# Patient Record
Sex: Female | Born: 1965
Health system: Southern US, Community
[De-identification: ages and names within clinical notes are randomized; demographics above are authoritative.]

## PROBLEM LIST (undated history)

## (undated) DIAGNOSIS — Z95810 Presence of automatic (implantable) cardiac defibrillator: Secondary | ICD-10-CM

## (undated) DIAGNOSIS — E669 Obesity, unspecified: Secondary | ICD-10-CM

## (undated) DIAGNOSIS — N189 Chronic kidney disease, unspecified: Secondary | ICD-10-CM

## (undated) DIAGNOSIS — G4730 Sleep apnea, unspecified: Secondary | ICD-10-CM

## (undated) DIAGNOSIS — I4819 Other persistent atrial fibrillation: Secondary | ICD-10-CM

## (undated) DIAGNOSIS — I119 Hypertensive heart disease without heart failure: Secondary | ICD-10-CM

## (undated) DIAGNOSIS — I10 Essential (primary) hypertension: Secondary | ICD-10-CM

## (undated) DIAGNOSIS — E039 Hypothyroidism, unspecified: Secondary | ICD-10-CM

## (undated) DIAGNOSIS — J302 Other seasonal allergic rhinitis: Secondary | ICD-10-CM

## (undated) DIAGNOSIS — I82601 Acute embolism and thrombosis of unspecified veins of right upper extremity: Secondary | ICD-10-CM

## (undated) DIAGNOSIS — I472 Ventricular tachycardia, unspecified: Secondary | ICD-10-CM

## (undated) DIAGNOSIS — F32A Depression, unspecified: Secondary | ICD-10-CM

## (undated) DIAGNOSIS — O903 Peripartum cardiomyopathy: Secondary | ICD-10-CM

## (undated) DIAGNOSIS — M1990 Unspecified osteoarthritis, unspecified site: Secondary | ICD-10-CM

## (undated) DIAGNOSIS — E119 Type 2 diabetes mellitus without complications: Secondary | ICD-10-CM

## (undated) DIAGNOSIS — J189 Pneumonia, unspecified organism: Secondary | ICD-10-CM

## (undated) DIAGNOSIS — I509 Heart failure, unspecified: Secondary | ICD-10-CM

## (undated) DIAGNOSIS — F329 Major depressive disorder, single episode, unspecified: Secondary | ICD-10-CM

## (undated) HISTORY — PX: CARDIAC DEFIBRILLATOR PLACEMENT: SHX171

## (undated) HISTORY — DX: Hypertensive heart disease without heart failure: I11.9

## (undated) HISTORY — DX: Obesity, unspecified: E66.9

## (undated) HISTORY — DX: Other seasonal allergic rhinitis: J30.2

## (undated) HISTORY — DX: Ventricular tachycardia, unspecified: I47.20

## (undated) HISTORY — DX: Other persistent atrial fibrillation: I48.19

## (undated) HISTORY — DX: Sleep apnea, unspecified: G47.30

## (undated) HISTORY — DX: Peripartum cardiomyopathy: O90.3

## (undated) HISTORY — DX: Ventricular tachycardia: I47.2

---
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
## 2002-03-31 HISTORY — PX: CHOLECYSTECTOMY: SHX55

## 2004-12-09 DIAGNOSIS — I420 Dilated cardiomyopathy: Secondary | ICD-10-CM | POA: Insufficient documentation

## 2004-12-09 DIAGNOSIS — I509 Heart failure, unspecified: Secondary | ICD-10-CM | POA: Insufficient documentation

## 2004-12-09 DIAGNOSIS — Z95810 Presence of automatic (implantable) cardiac defibrillator: Secondary | ICD-10-CM | POA: Insufficient documentation

## 2004-12-09 DIAGNOSIS — Z79899 Other long term (current) drug therapy: Secondary | ICD-10-CM | POA: Insufficient documentation

## 2011-05-20 DIAGNOSIS — I509 Heart failure, unspecified: Secondary | ICD-10-CM | POA: Diagnosis not present

## 2011-05-28 DIAGNOSIS — I428 Other cardiomyopathies: Secondary | ICD-10-CM | POA: Diagnosis not present

## 2011-05-28 DIAGNOSIS — Z95810 Presence of automatic (implantable) cardiac defibrillator: Secondary | ICD-10-CM | POA: Diagnosis not present

## 2011-07-15 ENCOUNTER — Other Ambulatory Visit: Payer: Self-pay

## 2011-07-15 DIAGNOSIS — Z95810 Presence of automatic (implantable) cardiac defibrillator: Secondary | ICD-10-CM | POA: Diagnosis not present

## 2011-07-15 DIAGNOSIS — Z79899 Other long term (current) drug therapy: Secondary | ICD-10-CM | POA: Diagnosis not present

## 2011-07-15 DIAGNOSIS — Z6841 Body Mass Index (BMI) 40.0 and over, adult: Secondary | ICD-10-CM | POA: Diagnosis not present

## 2011-07-15 DIAGNOSIS — I10 Essential (primary) hypertension: Secondary | ICD-10-CM | POA: Diagnosis not present

## 2011-07-15 DIAGNOSIS — E669 Obesity, unspecified: Secondary | ICD-10-CM | POA: Diagnosis not present

## 2011-07-15 DIAGNOSIS — T50901A Poisoning by unspecified drugs, medicaments and biological substances, accidental (unintentional), initial encounter: Secondary | ICD-10-CM | POA: Diagnosis not present

## 2011-07-15 DIAGNOSIS — R031 Nonspecific low blood-pressure reading: Secondary | ICD-10-CM | POA: Diagnosis not present

## 2011-07-15 DIAGNOSIS — D509 Iron deficiency anemia, unspecified: Secondary | ICD-10-CM | POA: Diagnosis present

## 2011-07-15 DIAGNOSIS — F4321 Adjustment disorder with depressed mood: Secondary | ICD-10-CM | POA: Diagnosis present

## 2011-07-15 DIAGNOSIS — T50991A Poisoning by other drugs, medicaments and biological substances, accidental (unintentional), initial encounter: Secondary | ICD-10-CM | POA: Diagnosis not present

## 2011-07-15 DIAGNOSIS — I428 Other cardiomyopathies: Secondary | ICD-10-CM | POA: Diagnosis not present

## 2011-07-15 DIAGNOSIS — T448X1A Poisoning by centrally-acting and adrenergic-neuron-blocking agents, accidental (unintentional), initial encounter: Secondary | ICD-10-CM | POA: Diagnosis present

## 2011-07-24 DIAGNOSIS — I10 Essential (primary) hypertension: Secondary | ICD-10-CM | POA: Diagnosis not present

## 2011-07-24 DIAGNOSIS — Z86718 Personal history of other venous thrombosis and embolism: Secondary | ICD-10-CM | POA: Diagnosis not present

## 2011-07-24 DIAGNOSIS — M79609 Pain in unspecified limb: Secondary | ICD-10-CM | POA: Diagnosis not present

## 2011-07-24 DIAGNOSIS — M7989 Other specified soft tissue disorders: Secondary | ICD-10-CM | POA: Diagnosis not present

## 2011-07-24 DIAGNOSIS — Z79899 Other long term (current) drug therapy: Secondary | ICD-10-CM | POA: Diagnosis not present

## 2011-07-25 DIAGNOSIS — I82619 Acute embolism and thrombosis of superficial veins of unspecified upper extremity: Secondary | ICD-10-CM | POA: Diagnosis not present

## 2011-07-25 DIAGNOSIS — M79609 Pain in unspecified limb: Secondary | ICD-10-CM | POA: Diagnosis not present

## 2011-07-25 DIAGNOSIS — M7989 Other specified soft tissue disorders: Secondary | ICD-10-CM | POA: Diagnosis not present

## 2011-08-12 ENCOUNTER — Encounter: Payer: Self-pay | Admitting: *Deleted

## 2011-08-19 ENCOUNTER — Encounter: Payer: Self-pay | Admitting: Cardiology

## 2011-08-25 DIAGNOSIS — I428 Other cardiomyopathies: Secondary | ICD-10-CM | POA: Diagnosis not present

## 2011-08-25 DIAGNOSIS — Z95810 Presence of automatic (implantable) cardiac defibrillator: Secondary | ICD-10-CM | POA: Diagnosis not present

## 2011-10-16 ENCOUNTER — Encounter: Payer: Self-pay | Admitting: Cardiology

## 2011-11-10 ENCOUNTER — Encounter: Payer: Self-pay | Admitting: Cardiology

## 2011-11-10 ENCOUNTER — Ambulatory Visit (INDEPENDENT_AMBULATORY_CARE_PROVIDER_SITE_OTHER): Payer: Medicare Other | Admitting: Cardiology

## 2011-11-10 VITALS — BP 159/91 | HR 97 | Ht 67.0 in | Wt 297.0 lb

## 2011-11-10 DIAGNOSIS — O903 Peripartum cardiomyopathy: Secondary | ICD-10-CM | POA: Diagnosis not present

## 2011-11-10 DIAGNOSIS — I10 Essential (primary) hypertension: Secondary | ICD-10-CM | POA: Insufficient documentation

## 2011-11-10 MED ORDER — FUROSEMIDE 20 MG PO TABS: 20.0000 mg | ORAL_TABLET | Freq: Every day | ORAL | Status: DC | PRN

## 2011-11-10 MED ORDER — CARVEDILOL 25 MG PO TABS: 37.5000 mg | ORAL_TABLET | Freq: Two times a day (BID) | ORAL | Status: DC

## 2011-11-10 MED ORDER — LISINOPRIL 20 MG PO TABS: 20.0000 mg | ORAL_TABLET | Freq: Two times a day (BID) | ORAL | Status: DC

## 2011-11-10 NOTE — Patient Instructions (Addendum)
Your physician recommends that you schedule a follow-up appointment in: 2 months with Dr. Antoine Poche.  Your physician has recommended you make the following change in your medication: increase your carvedilol to 37.5 mg twice daily. Please take 1&1/2 tablets twice daily of your 25 mg tablets. Your physician has requested that you have a MUGA: A multigated acquisition (MUGA) scan is a test that looks at the chambers and blood vessels of the heart. Please see the CareNotes handout/brochure given to you today for further information. You have been referred to ICD Clinic here at [facility].

## 2011-11-10 NOTE — Progress Notes (Signed)
HPI The patient has a history of a postpartum cardiomyopathy in [place]. She has not been seen in this clinic before.  Her cardiomyopathy was diagnosed in 1996 and she says her EF was about 20%. After treatment it went up to about 45%. In 2002 she was living in [facility] and had sustained ventricular tachycardia requiring placement of an ICD. She says the battery was replaced about 3 years ago. She said at one point in time following her ICD her EF was actually up to 60% though she thinks most recently a few years ago a mild suggested to be 49%. She was in the hospital this spring after an intentional overdose of her beta blocker. She was referred here for followup after this.    The patient reports that she's been doing fine. Earlier in the year she been losing weight but gained it back. She's anxious to be back on her diet. She does some exercising. With this she denies any chest pressure, neck or arm discomfort. She has no shortness of breath, PND or orthopnea. She has no palpitations, presyncope or syncope. She's had no edema.  Allergies  Allergen Reactions  . Morphine And Related Nausea Only and Other (See Comments)    Feels sick,and like she is going to pass out    Current Outpatient Prescriptions  Medication Sig Dispense Refill  . carvedilol (COREG) 25 MG tablet Take 25 mg by mouth 2 (two) times daily with a meal.      . furosemide (LASIX) 20 MG tablet Take 20 mg by mouth daily as needed.      Marland Kitchen lisinopril (PRINIVIL,ZESTRIL) 20 MG tablet Take 20 mg by mouth 2 (two) times daily.      . Multiple Vitamin (MULTIVITAMIN) tablet Take 1 tablet by mouth daily.        Past Medical History  Diagnosis Date  . Postpartum cardiomyopathy     with history of sustained V tach, s/p ICD placement. Last echocardiogram was last year and EF 45 -50 %  . Unspecified essential hypertension   . Seasonal allergies   . Palpitations     Past Surgical History  Procedure Date  . Cardiac  defibrillator placement     X'S 2  . Cholecystectomy   . Cesarean section     Family History  Problem Relation Age of Onset  . Other Mother     has chronic pain syndrome from DDD with spinal cord stimulator    History   Social History  . Marital Status: Divorced    Spouse Name: N/A    Number of Children: N/A  . Years of Education: N/A   Occupational History  . Not on file.   Social History Main Topics  . Smoking status: Never Smoker   . Smokeless tobacco: Never Used  . Alcohol Use: Not on file  . Drug Use: Not on file  . Sexually Active: Not on file   Other Topics Concern  . Not on file   Social History Narrative   Lives with her parents and 73 year old daughter    ROS:  As stated in the HPI and negative for all other systems.   PHYSICAL EXAM BP 159/91  Pulse 97  Ht 5\' 7"  (1.702 m)  Wt 297 lb (134.718 kg)  BMI 46.52 kg/m2 GENERAL:  Well appearing HEENT:  Pupils equal round and reactive, fundi not visualized, oral mucosa unremarkable NECK:  No jugular venous distention, waveform within normal limits, carotid upstroke brisk  and symmetric, no bruits, no thyromegaly LYMPHATICS:  No cervical, inguinal adenopathy LUNGS:  Clear to auscultation bilaterally BACK:  No CVA tenderness CHEST:  Well healed ICD pocket HEART:  PMI not displaced or sustained,S1 and S2 within normal limits, no S3, no S4, no clicks, no rubs, no murmurs ABD:  Flat, positive bowel sounds normal in frequency in pitch, no bruits, no rebound, no guarding, no midline pulsatile mass, no hepatomegaly, no splenomegaly, obese EXT:  2 plus pulses throughout, no edema, no cyanosis no clubbing SKIN:  No rashes no nodules NEURO:  Cranial nerves II through XII grossly intact, motor grossly intact throughout [facility]:  Cognitively intact, oriented to person place and time  EKG:  Sinus rhythm, rate 81, poor anterior R wave progression, low voltage in the limb leads, no acute ST-T wave changes  07/15/11  ASSESSMENT AND PLAN  Peripartum cardiomyopathy - I will valuate with a MUGA as she says she has poor acoustic windows.  I will be increasing her beta blocker as below.  Hypertension - She was supposed to be on Norvasc but stopped this because she was fatigued. I have encouraged her to increase her carvedilol to 37.5 mg twice a day as she does need better blood pressure control.  Obesity - The patient understands the need to lose weight with diet and exercise. We have discussed specific strategies for this.  ICD - I will have her followup in the ICD clinic

## 2011-11-12 ENCOUNTER — Other Ambulatory Visit: Payer: Self-pay | Admitting: Cardiology

## 2011-11-12 DIAGNOSIS — O903 Peripartum cardiomyopathy: Secondary | ICD-10-CM

## 2011-11-14 ENCOUNTER — Encounter: Payer: Self-pay | Admitting: Internal Medicine

## 2011-12-11 DIAGNOSIS — O903 Peripartum cardiomyopathy: Secondary | ICD-10-CM | POA: Diagnosis not present

## 2011-12-11 DIAGNOSIS — I428 Other cardiomyopathies: Secondary | ICD-10-CM | POA: Diagnosis not present

## 2011-12-11 DIAGNOSIS — I10 Essential (primary) hypertension: Secondary | ICD-10-CM | POA: Diagnosis not present

## 2011-12-15 ENCOUNTER — Encounter: Payer: Self-pay | Admitting: Internal Medicine

## 2011-12-15 ENCOUNTER — Ambulatory Visit (INDEPENDENT_AMBULATORY_CARE_PROVIDER_SITE_OTHER): Payer: Medicare Other | Admitting: Internal Medicine

## 2011-12-15 VITALS — BP 134/92 | HR 95 | Ht 66.0 in | Wt 304.0 lb

## 2011-12-15 DIAGNOSIS — O903 Peripartum cardiomyopathy: Secondary | ICD-10-CM | POA: Diagnosis not present

## 2011-12-15 DIAGNOSIS — I472 Ventricular tachycardia, unspecified: Secondary | ICD-10-CM

## 2011-12-15 DIAGNOSIS — I509 Heart failure, unspecified: Secondary | ICD-10-CM | POA: Diagnosis not present

## 2011-12-15 LAB — ICD DEVICE OBSERVATION
BATTERY VOLTAGE: 3.1091 V
BRDY-0002RV: 40 {beats}/min
CHARGE TIME: 9.339 s
DEV-0020ICD: NEGATIVE
PACEART VT: 0
RV LEAD AMPLITUDE: 6 mv
TOT-0002: 0
TOT-0006: 20100824000000
TZAT-0004SLOWVT: 8
TZAT-0005FASTVT: 88 pct
TZAT-0005SLOWVT: 88 pct
TZAT-0011FASTVT: 10 ms
TZAT-0012FASTVT: 200 ms
TZAT-0012SLOWVT: 200 ms
TZAT-0013FASTVT: 1
TZAT-0013SLOWVT: 3
TZAT-0019FASTVT: 8 V
TZAT-0020SLOWVT: 1.5 ms
TZON-0003FASTVT: 240 ms
TZON-0003SLOWVT: 330 ms
TZON-0003VSLOWVT: 360 ms
TZON-0004SLOWVT: 32
TZON-0004VSLOWVT: 36
TZON-0005SLOWVT: 12
TZST-0001FASTVT: 3
TZST-0001FASTVT: 5
TZST-0001FASTVT: 6
TZST-0001SLOWVT: 5
TZST-0003FASTVT: 35 J
TZST-0003FASTVT: 35 J
TZST-0003FASTVT: 35 J
TZST-0003SLOWVT: 35 J
TZST-0003SLOWVT: 35 J
VENTRICULAR PACING ICD: 0.01 pct

## 2011-12-15 NOTE — Assessment & Plan Note (Signed)
She has had sustained ventricular tachycardia previously, though no episodes since her ICD was implanted per her report. She does recall having inappropriate shocks for SVT.  Normal ICD function today See Arita Miss Art report NIDs increased to reduce risks of inappropriate ICD shocks  Carelink every 3 months Return in 1year to see me in the [place] device clinic

## 2011-12-15 NOTE — Progress Notes (Signed)
Erin Kos, MD: Primary Cardiologist:  Dr Erin Jarvis is a 46 y.o. female with a h/o peripartum cardiomyopathy sp ICD (MDT) in [place] who presents today to establish care in the Electrophysiology device clinic.  Her cardiomyopathy was diagnosed in 82. In May 2002 she had sustained ventricular tachycardia with subsequent placement of an ICD.   She reports having inappropriate shocks for SVT but denies shocks for VT thereafter.  She moved to [facility] and had her device followed in [place].  She underwent generator replacement 11/21/2008 and has done well since.  She remains active.  The patient reports doing very well since having a pacemaker implanted and remains very active despite her age.  Recently, she has had mild SOB above her baseline and minor edema.  Today, she  denies symptoms of palpitations, chest pain,  orthopnea, PND, dizziness, presyncope, syncope, or neurologic sequela.  The patientis tolerating medications without difficulties and is otherwise without complaint today.   Past Medical History  Diagnosis Date  . Postpartum cardiomyopathy     Last echocardiogram was last year and EF 45 -50 %  . Unspecified essential hypertension   . Seasonal allergies   . Palpitations   . Ventricular tachycardia     s/p ICD Implantation (MDT Secura VR)   Past Surgical History  Procedure Date  . Cardiac defibrillator placement 08/12/2000, 11/11/2008    Most recent Generator MDT Secura VR placed in [place]  . Cholecystectomy   . Cesarean section     History   Social History  . Marital Status: Divorced    Spouse Name: N/A    Number of Children: N/A  . Years of Education: N/A   Occupational History  . Not on file.   Social History Main Topics  . Smoking status: Never Smoker   . Smokeless tobacco: Never Used  . Alcohol Use: No  . Drug Use: No  . Sexually Active: Not on file   Other Topics Concern  . Not on file   Social History Narrative   Lives with  her parents and 34 year old daughter in [place].    Family History  Problem Relation Age of Onset  . Other Mother     has chronic pain syndrome from DDD with spinal cord stimulator    Allergies  Allergen Reactions  . Morphine And Related Nausea Only and Other (See Comments)    Feels sick,and like she is going to pass out    Current Outpatient Prescriptions  Medication Sig Dispense Refill  . carvedilol (COREG) 25 MG tablet Take 1.5 tablets (37.5 mg total) by mouth 2 (two) times daily with a meal.  90 tablet  3  . furosemide (LASIX) 20 MG tablet Take 1 tablet (20 mg total) by mouth daily as needed.  30 tablet  3  . lisinopril (PRINIVIL,ZESTRIL) 20 MG tablet Take 1 tablet (20 mg total) by mouth 2 (two) times daily.  60 tablet  3  . Multiple Vitamin (MULTIVITAMIN) tablet Take 1 tablet by mouth daily.        ROS- all systems are reviewed and negative except as per HPI  Physical Exam: Filed Vitals:   12/15/11 1358  BP: 134/92  Pulse: 95  Height: 5\' 6"  (1.676 m)  Weight: 304 lb (137.893 kg)    GEN- The patient is well appearing, alert and oriented x 3 today.   Head- normocephalic, atraumatic Eyes-  Sclera clear, conjunctiva pink Ears- hearing intact Oropharynx- clear Neck- supple, JVP  9cm Lymph- no cervical lymphadenopathy Lungs- few basilar rales, normal work of breathing Chest- ICD pocket is well healed Heart- Regular rate and rhythm, no murmurs, rubs or gallops, PMI not laterally displaced GI- soft, NT, ND, + BS Extremities- no clubbing, cyanosis, 1+ edema MS- no significant deformity or atrophy Skin- no rash or lesion Psych- euthymic mood, full affect Neuro- strength and sensation are intact  Pacemaker interrogation- reviewed in detail today,  See PACEART report  Assessment and Plan:

## 2011-12-15 NOTE — Assessment & Plan Note (Signed)
Mildly volume overloaded on exam and optivol is elevated She will take lasix 20mg  daily x 5 days and then return to prn should her weight increase by more than 2 lbs in 24 hours. 2 gram sodium restriction and daily weights  Follow-up with Dr Antoine Poche as scheduled.

## 2011-12-15 NOTE — Patient Instructions (Addendum)
Your physician wants you to follow-up in: 12 months with Dr Jacquiline Doe will receive a reminder letter in the mail two months in advance. If you don't receive a letter, please call our office to schedule the follow-up appointment.   Remote monitoring is used to monitor your Pacemaker of ICD from home. This monitoring reduces the number of office visits required to check your device to one time per year. It allows [place] to keep an eye on the functioning of your device to ensure it is working properly. You are scheduled for a device check from home on 03/22/12 You may send your transmission at any time that day. If you have a wireless device, the transmission will be sent automatically. After your physician reviews your transmission, you will receive a postcard with your next transmission date.  Your physician has recommended you make the following change in your medication:  1) Take your Furosemide 20mg  for 5 days then go back to as need for weight gain  2 Gram Low Sodium Diet A 2 gram sodium diet restricts the amount of sodium in the diet to no more than 2 g or 2000 mg daily. Limiting the amount of sodium is often used to help lower blood pressure. It is important if you have heart, liver, or kidney problems. Many foods contain sodium for flavor and sometimes as a preservative. When the amount of sodium in a diet needs to be low, it is important to know what to look for when choosing foods and drinks. The following includes some information and guidelines to help make it easier for you to adapt to a low sodium diet. QUICK TIPS  Do not add salt to food.   Avoid convenience items and fast food.   Choose unsalted snack foods.   Buy lower sodium products, often labeled as "lower sodium" or "no salt added."   Check food labels to learn how much sodium is in 1 serving.   When eating at a restaurant, ask that your food be prepared with less salt or none, if possible.  READING FOOD LABELS FOR SODIUM  INFORMATION The nutrition facts label is a good place to find how much sodium is in foods. Look for products with no more than 500 to 600 mg of sodium per meal and no more than 150 mg per serving. Remember that 2 g = 2000 mg. The food label may also list foods as:  Sodium-free: Less than 5 mg in a serving.   Very low sodium: 35 mg or less in a serving.   Low-sodium: 140 mg or less in a serving.   Light in sodium: 50% less sodium in a serving. For example, if a food that usually has 300 mg of sodium is changed to become light in sodium, it will have 150 mg of sodium.   Reduced sodium: 25% less sodium in a serving. For example, if a food that usually has 400 mg of sodium is changed to reduced sodium, it will have 300 mg of sodium.  CHOOSING FOODS Grains  Avoid: Salted crackers and snack items. Some cereals, including instant hot cereals. Bread stuffing and biscuit mixes. Seasoned rice or pasta mixes.   Choose: Unsalted snack items. Low-sodium cereals, oats, puffed wheat and rice, shredded wheat. English muffins and bread. Pasta.  Meats  Avoid: Salted, canned, smoked, spiced, pickled meats, including fish and poultry. Bacon, ham, sausage, cold cuts, hot dogs, anchovies.   Choose: Low-sodium canned tuna and salmon. Fresh or frozen meat, poultry,  and fish.  Dairy  Avoid: Processed cheese and spreads. Cottage cheese. Buttermilk and condensed milk. Regular cheese.   Choose: Milk. Low-sodium cottage cheese. Yogurt. Sour cream. Low-sodium cheese.  Fruits and Vegetables  Avoid: Regular canned vegetables. Regular canned tomato sauce and paste. Frozen vegetables in sauces. Olives. Rosita Fire. Relishes. Sauerkraut.   Choose: Low-sodium canned vegetables. Low-sodium tomato sauce and paste. Frozen or fresh vegetables. Fresh and frozen fruit.  Condiments  Avoid: Canned and packaged gravies. Worcestershire sauce. Tartar sauce. Barbecue sauce. Soy sauce. Steak sauce. Ketchup. Onion, garlic, and table  salt. Meat flavorings and tenderizers.   Choose: Fresh and dried herbs and spices. Low-sodium varieties of mustard and ketchup. Lemon juice. Tabasco sauce. Horseradish.  SAMPLE 2 GRAM SODIUM MEAL PLAN Breakfast / Sodium (mg)  1 cup low-fat milk / 143 mg   2 slices whole-wheat toast / 270 mg   1 tbs heart-healthy margarine / 153 mg   1 hard-boiled egg / 139 mg   1 small orange / 0 mg  Lunch / Sodium (mg)  1 cup raw carrots / 76 mg    cup hummus / 298 mg   1 cup low-fat milk / 143 mg    cup red grapes / 2 mg   1 whole-wheat pita bread / 356 mg  Dinner / Sodium (mg)  1 cup whole-wheat pasta / 2 mg   1 cup low-sodium tomato sauce / 73 mg   3 oz lean ground beef / 57 mg   1 small side salad (1 cup raw spinach leaves,  cup cucumber,  cup yellow bell pepper) with 1 tsp olive oil and 1 tsp red wine vinegar / 25 mg  Snack / Sodium (mg)  1 container low-fat vanilla yogurt / 107 mg   3 graham cracker squares / 127 mg  Nutrient Analysis  Calories: 2033   Protein: 77 g   Carbohydrate: 282 g   Fat: 72 g   Sodium: 1971 mg  Document Released: 03/17/2005 Document Revised: 03/06/2011 Document Reviewed: 06/18/2009 [facility] Patient Information 2012 [place], [place].

## 2011-12-17 ENCOUNTER — Telehealth: Payer: Self-pay | Admitting: *Deleted

## 2011-12-17 NOTE — Telephone Encounter (Signed)
Message copied by Eustace Moore on Wed Dec 17, 2011  4:47 PM ------      Message from: Rollene Rotunda      Created: Tue Dec 16, 2011  3:17 PM       EF is moderately reduced.  Continue current medications.  She is supposed to have a follow up with me in October.  Call Ms. Handa with the results and send results to Theodoro Kos, MD

## 2011-12-17 NOTE — Telephone Encounter (Signed)
Patient informed. 

## 2012-01-12 ENCOUNTER — Ambulatory Visit: Payer: Medicare Other | Admitting: Cardiology

## 2012-02-09 ENCOUNTER — Encounter: Payer: Self-pay | Admitting: Cardiology

## 2012-02-09 ENCOUNTER — Ambulatory Visit (INDEPENDENT_AMBULATORY_CARE_PROVIDER_SITE_OTHER): Payer: Medicare Other | Admitting: Cardiology

## 2012-02-09 VITALS — BP 159/93 | HR 101 | Ht 67.0 in | Wt 300.8 lb

## 2012-02-09 DIAGNOSIS — O903 Peripartum cardiomyopathy: Secondary | ICD-10-CM | POA: Diagnosis not present

## 2012-02-09 DIAGNOSIS — I472 Ventricular tachycardia, unspecified: Secondary | ICD-10-CM

## 2012-02-09 DIAGNOSIS — I10 Essential (primary) hypertension: Secondary | ICD-10-CM

## 2012-02-09 MED ORDER — SPIRONOLACTONE 25 MG PO TABS: 25.0000 mg | ORAL_TABLET | Freq: Every day | ORAL | Status: DC

## 2012-02-09 NOTE — Progress Notes (Signed)
HPI The patient has a history of a postpartum cardiomyopathy in [place]. She has not been seen in this clinic before.  Her cardiomyopathy was diagnosed in 1996 and she says her EF was about 20%. After treatment it went up to about 45%. There may have been a point where it was 60% to possibly 50%. In 2002 she was living in [facility] and had sustained ventricular tachycardia requiring placement of an ICD.   Since I last saw her I did send her for a MUGA which demonstrated an EF of about 35% although there was some question of some problem during the study as she had poor IV access. However, the report did not mention this. She's had some weight gain and is disappointed. She had some back pain that limited her activities. She has had no new shortness of breath. The patient denies any new symptoms such as chest discomfort, neck or arm discomfort. There has been no new shortness of breath, PND or orthopnea. There have been no reported palpitations, presyncope or syncope.  She did have only one episode of lightheadedness with a low blood pressure during her heavy menstrual cycle.  Allergies  Allergen Reactions  . Morphine And Related Nausea Only and Other (See Comments)    Feels sick,and like she is going to pass out    Current Outpatient Prescriptions  Medication Sig Dispense Refill  . carvedilol (COREG) 25 MG tablet Take 1.5 tablets (37.5 mg total) by mouth 2 (two) times daily with a meal.  90 tablet  3  . furosemide (LASIX) 20 MG tablet Take 1 tablet (20 mg total) by mouth daily as needed.  30 tablet  3  . lisinopril (PRINIVIL,ZESTRIL) 20 MG tablet Take 1 tablet (20 mg total) by mouth 2 (two) times daily.  60 tablet  3  . Multiple Vitamin (MULTIVITAMIN) tablet Take 1 tablet by mouth daily.        Past Medical History  Diagnosis Date  . Postpartum cardiomyopathy     Last echocardiogram was last year and EF 45 -50 %  . Unspecified essential hypertension   . Seasonal  allergies   . Palpitations   . Ventricular tachycardia     s/p ICD Implantation (MDT Secura VR)    Past Surgical History  Procedure Date  . Cardiac defibrillator placement 08/12/2000, 11/11/2008    Most recent Generator MDT Secura VR placed in [place]  . Cholecystectomy   . Cesarean section     ROS:  As stated in the HPI and negative for all other systems.   PHYSICAL EXAM BP 159/93  Pulse 101  Ht 5\' 7"  (1.702 m)  Wt 300 lb 12.8 oz (136.442 kg)  BMI 47.11 kg/m2  SpO2 96% GENERAL:  Well appearing HEENT:  Pupils equal round and reactive, fundi not visualized, oral mucosa unremarkable NECK:  No jugular venous distention, waveform within normal limits, carotid upstroke brisk and symmetric, no bruits, no thyromegaly LUNGS:  Clear to auscultation bilaterally CHEST:  Well healed ICD pocket HEART:  PMI not displaced or sustained,S1 and S2 within normal limits, no S3, no S4, no clicks, no rubs, no murmurs ABD:  Flat, positive bowel sounds normal in frequency in pitch, no bruits, no rebound, no guarding, no midline pulsatile mass, no hepatomegaly, no splenomegaly, obese EXT:  2 plus pulses throughout, no edema, no cyanosis no clubbing EXT:  No edema   ASSESSMENT AND PLAN  Peripartum cardiomyopathy - She 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 I will titrate  the meds as listed below.  Hypertension - Today I will add spironolactone 25 mg daily with appropriate followup of her basic metabolic profile.  Obesity - I suggested swimming as exercise she might be able to do with her joint pains.  ICD - She has now had followup in the ICD clinic.

## 2012-02-09 NOTE — Patient Instructions (Addendum)
Your physician recommends that you schedule a follow-up appointment in: 6 weeks. Your physician has recommended you make the following change in your medication: start spironolactone 25 mg daily. Your new prescription has been sent to your pharmacy. All other medications will remain the same. Your physician recommends that you return for lab work in: 3 days,1 week and 1 month of starting spironolactone. Please have this (BMET) done at [facility].

## 2012-02-13 DIAGNOSIS — I10 Essential (primary) hypertension: Secondary | ICD-10-CM | POA: Diagnosis not present

## 2012-02-17 DIAGNOSIS — I10 Essential (primary) hypertension: Secondary | ICD-10-CM | POA: Diagnosis not present

## 2012-02-17 DIAGNOSIS — I472 Ventricular tachycardia: Secondary | ICD-10-CM | POA: Diagnosis not present

## 2012-02-23 ENCOUNTER — Telehealth: Payer: Self-pay | Admitting: *Deleted

## 2012-02-23 NOTE — Telephone Encounter (Signed)
Message copied by Eustace Moore on Mon Feb 23, 2012 10:08 AM ------      Message from: Rollene Rotunda      Created: Thu Feb 19, 2012  7:33 PM       Labs OK.  Continue current therapy.  Call Ms. Horvath with the results and send results to Theodoro Kos, MD

## 2012-02-23 NOTE — Telephone Encounter (Signed)
Patient informed via message machine. 

## 2012-03-11 DIAGNOSIS — O903 Peripartum cardiomyopathy: Secondary | ICD-10-CM | POA: Diagnosis not present

## 2012-03-11 DIAGNOSIS — I10 Essential (primary) hypertension: Secondary | ICD-10-CM | POA: Diagnosis not present

## 2012-03-11 DIAGNOSIS — I472 Ventricular tachycardia: Secondary | ICD-10-CM | POA: Diagnosis not present

## 2012-03-15 ENCOUNTER — Telehealth: Payer: Self-pay | Admitting: *Deleted

## 2012-03-15 NOTE — Telephone Encounter (Signed)
Left message for patient to call office.  

## 2012-03-15 NOTE — Telephone Encounter (Signed)
Message copied by Eustace Moore on Mon Mar 15, 2012 11:22 AM ------      Message from: Rollene Rotunda      Created: Sun Mar 14, 2012  5:17 PM       Labs OK.  Call Ms. Arntson with the results and send results to Theodoro Kos, MD

## 2012-03-16 NOTE — Telephone Encounter (Signed)
Left message for patient to call office.  

## 2012-03-17 NOTE — Telephone Encounter (Signed)
Patient informed. 

## 2012-03-22 ENCOUNTER — Encounter: Payer: Medicare Other | Admitting: *Deleted

## 2012-03-29 ENCOUNTER — Ambulatory Visit (INDEPENDENT_AMBULATORY_CARE_PROVIDER_SITE_OTHER): Payer: Medicare Other | Admitting: Cardiology

## 2012-03-29 ENCOUNTER — Encounter: Payer: Self-pay | Admitting: Cardiology

## 2012-03-29 VITALS — BP 153/94 | HR 101 | Ht 67.0 in | Wt 304.5 lb

## 2012-03-29 DIAGNOSIS — I509 Heart failure, unspecified: Secondary | ICD-10-CM

## 2012-03-29 DIAGNOSIS — I10 Essential (primary) hypertension: Secondary | ICD-10-CM

## 2012-03-29 DIAGNOSIS — I472 Ventricular tachycardia, unspecified: Secondary | ICD-10-CM

## 2012-03-29 DIAGNOSIS — O903 Peripartum cardiomyopathy: Secondary | ICD-10-CM

## 2012-03-29 NOTE — Progress Notes (Signed)
HPI The patient has a history of a postpartum cardiomyopathy in [place]. She has not been seen in this clinic before.  Her cardiomyopathy was diagnosed in 1996 and she says her EF was about 20%. After treatment it went up to about 45%. There may have been a point where it was 60% to possibly 50%. In 2002 she was living in [facility] and had sustained ventricular tachycardia requiring placement of an ICD.  Earlier this year I sent her for a MUGA which demonstrated an EF of about 35%.  At the last visit her blood pressure was elevated so I started spironolactone.  She actually says that she has felt better with this. She thinks her blood pressure has been down and at home she is getting readings of 130/70. She denies any lightheaded episodes of low blood pressures as she had once previously. She denies any presyncope or syncope. She thinks she is breathing well. She's having no PND or orthopnea. She's having no chest pressure, neck or arm discomfort. She's having no new edema. She's had no palpitations.  Allergies  Allergen Reactions  . Morphine And Related Nausea Only and Other (See Comments)    Feels sick,and like she is going to pass out    Current Outpatient Prescriptions  Medication Sig Dispense Refill  . carvedilol (COREG) 25 MG tablet Take 1.5 tablets (37.5 mg total) by mouth 2 (two) times daily with a meal.  90 tablet  3  . furosemide (LASIX) 20 MG tablet Take 1 tablet (20 mg total) by mouth daily as needed.  30 tablet  3  . lisinopril (PRINIVIL,ZESTRIL) 20 MG tablet Take 1 tablet (20 mg total) by mouth 2 (two) times daily.  60 tablet  3  . Multiple Vitamin (MULTIVITAMIN) tablet Take 1 tablet by mouth daily.      Marland Kitchen spironolactone (ALDACTONE) 25 MG tablet Take 1 tablet (25 mg total) by mouth daily.  90 tablet  3    Past Medical History  Diagnosis Date  . Postpartum cardiomyopathy     Last echocardiogram was last year and EF 45 -50 %  . Unspecified essential  hypertension   . Seasonal allergies   . Palpitations   . Ventricular tachycardia     s/p ICD Implantation (MDT [employer]ura VR)    Past Surgical History  Procedure Date  . Cardiac defibrillator placement 08/12/2000, 11/11/2008    Most recent Generator MDT Secura VR placed in [place]  . Cholecystectomy   . Cesarean section     ROS:  As stated in the HPI and negative for all other systems.   PHYSICAL EXAM BP 153/94  Pulse 101  Ht 5\' 7"  (1.702 m)  Wt 304 lb 8 oz (138.12 kg)  BMI 47.69 kg/m2 GENERAL:  Well appearing HEENT:  Pupils equal round and reactive, fundi not visualized, oral mucosa unremarkable NECK:  No jugular venous distention, waveform within normal limits, carotid upstroke brisk and symmetric, no bruits, no thyromegaly LUNGS:  Clear to auscultation bilaterally CHEST:  Well healed ICD pocket HEART:  PMI not displaced or sustained,S1 and S2 within normal limits, no S3, no S4, no clicks, no rubs, no murmurs ABD:  Flat, positive bowel sounds normal in frequency in pitch, no bruits, no rebound, no guarding, no midline pulsatile mass, no hepatomegaly, no splenomegaly, obese EXT:  2 plus pulses throughout, no edema, no cyanosis no clubbing, obese EXT:  No edema   ASSESSMENT AND PLAN  Peripartum cardiomyopathy - She seems to be  euvolemic. She will continue on the meds as listed.   Hypertension - She tolerated spironolactone.  She will have appropriate followup of the labs. We have been following to be met in her potassium and creatinine are stable.  Obesity - The patient understands the need to lose weight with diet and exercise. We have discussed specific strategies for this.  ICD - She has now had followup in the ICD clinic.  She is having some trouble with her remote device as it is still sending the information to her previous physicians. We will send a note to our EP staff.

## 2012-03-29 NOTE — Patient Instructions (Signed)
Continue all current medications. Lab for BMET in 3 months Office will contact with results Your physician wants you to follow up in: 6 months.  You will receive a reminder letter in the mail one-two months in advance.  If you don't receive a letter, please call our office to schedule the follow up appointment

## 2012-03-30 ENCOUNTER — Encounter: Payer: Self-pay | Admitting: *Deleted

## 2012-04-14 ENCOUNTER — Other Ambulatory Visit: Payer: Self-pay | Admitting: Cardiology

## 2012-04-30 ENCOUNTER — Encounter: Payer: Self-pay | Admitting: *Deleted

## 2012-05-24 ENCOUNTER — Telehealth: Payer: Self-pay | Admitting: Internal Medicine

## 2012-05-24 NOTE — Telephone Encounter (Signed)
Patient to send transmission in the am

## 2012-05-24 NOTE — Telephone Encounter (Signed)
New problem   Pt received letter last week to get AICD pacer check. Pt stated if she remotely call it in it will not come here but it will go to the other office she was originally at in [place]. Pt wants to know if she has to come in and get another device or do you have to reprogram the device for our office use.

## 2012-07-02 ENCOUNTER — Ambulatory Visit (INDEPENDENT_AMBULATORY_CARE_PROVIDER_SITE_OTHER): Payer: Medicare Other | Admitting: *Deleted

## 2012-07-02 ENCOUNTER — Other Ambulatory Visit: Payer: Self-pay | Admitting: Internal Medicine

## 2012-07-02 ENCOUNTER — Encounter: Payer: Self-pay | Admitting: Internal Medicine

## 2012-07-02 DIAGNOSIS — I509 Heart failure, unspecified: Secondary | ICD-10-CM | POA: Diagnosis not present

## 2012-07-02 DIAGNOSIS — I472 Ventricular tachycardia, unspecified: Secondary | ICD-10-CM

## 2012-07-02 DIAGNOSIS — Z95810 Presence of automatic (implantable) cardiac defibrillator: Secondary | ICD-10-CM | POA: Diagnosis not present

## 2012-07-06 LAB — REMOTE ICD DEVICE
BATTERY VOLTAGE: 3.0955 V
BRDY-0002RV: 40 {beats}/min
FVT: 0
PACEART VT: 0
RV LEAD IMPEDENCE ICD: 475 Ohm
RV LEAD THRESHOLD: 1.125 V
TOT-0002: 0
TZAT-0001FASTVT: 1
TZAT-0001SLOWVT: 1
TZAT-0004FASTVT: 8
TZAT-0005FASTVT: 88 pct
TZAT-0012SLOWVT: 200 ms
TZAT-0013FASTVT: 1
TZAT-0019SLOWVT: 8 V
TZAT-0020FASTVT: 1.5 ms
TZAT-0020SLOWVT: 1.5 ms
TZON-0003FASTVT: 240 ms
TZON-0003SLOWVT: 330 ms
TZST-0001FASTVT: 2
TZST-0001FASTVT: 4
TZST-0001FASTVT: 6
TZST-0001SLOWVT: 2
TZST-0001SLOWVT: 4
TZST-0001SLOWVT: 6
TZST-0003FASTVT: 35 J
TZST-0003FASTVT: 35 J
TZST-0003FASTVT: 35 J
TZST-0003SLOWVT: 35 J
TZST-0003SLOWVT: 35 J

## 2012-07-13 ENCOUNTER — Encounter: Payer: Self-pay | Admitting: *Deleted

## 2012-07-29 ENCOUNTER — Other Ambulatory Visit: Payer: Self-pay | Admitting: Cardiology

## 2012-08-28 ENCOUNTER — Other Ambulatory Visit: Payer: Self-pay | Admitting: Cardiology

## 2012-08-30 NOTE — Telephone Encounter (Signed)
..   Requested Prescriptions   Pending Prescriptions Disp Refills  . lisinopril (PRINIVIL,ZESTRIL) 20 MG tablet [Pharmacy Med Name: LISINOPRIL 20MG  TABLET] 60 tablet 1    Sig: TAKE 1 TABLET BY MOUTH TWICE DAILY

## 2012-09-22 DIAGNOSIS — N390 Urinary tract infection, site not specified: Secondary | ICD-10-CM | POA: Diagnosis not present

## 2012-09-22 DIAGNOSIS — I10 Essential (primary) hypertension: Secondary | ICD-10-CM | POA: Diagnosis not present

## 2012-09-22 DIAGNOSIS — Z95810 Presence of automatic (implantable) cardiac defibrillator: Secondary | ICD-10-CM | POA: Diagnosis not present

## 2012-09-22 DIAGNOSIS — I428 Other cardiomyopathies: Secondary | ICD-10-CM | POA: Diagnosis not present

## 2012-09-22 DIAGNOSIS — Z79899 Other long term (current) drug therapy: Secondary | ICD-10-CM | POA: Diagnosis not present

## 2012-09-22 DIAGNOSIS — Z87440 Personal history of urinary (tract) infections: Secondary | ICD-10-CM | POA: Diagnosis not present

## 2012-10-04 ENCOUNTER — Ambulatory Visit (INDEPENDENT_AMBULATORY_CARE_PROVIDER_SITE_OTHER): Payer: Medicare Other | Admitting: *Deleted

## 2012-10-04 ENCOUNTER — Encounter: Payer: Self-pay | Admitting: Internal Medicine

## 2012-10-04 DIAGNOSIS — I509 Heart failure, unspecified: Secondary | ICD-10-CM

## 2012-10-04 DIAGNOSIS — I472 Ventricular tachycardia, unspecified: Secondary | ICD-10-CM

## 2012-10-04 DIAGNOSIS — Z95810 Presence of automatic (implantable) cardiac defibrillator: Secondary | ICD-10-CM | POA: Diagnosis not present

## 2012-10-05 LAB — REMOTE ICD DEVICE
DEV-0020ICD: NEGATIVE
FVT: 0
PACEART VT: 0
TOT-0001: 1
TZAT-0004FASTVT: 8
TZAT-0004SLOWVT: 8
TZAT-0005SLOWVT: 88 pct
TZAT-0011FASTVT: 10 ms
TZAT-0011SLOWVT: 10 ms
TZAT-0012FASTVT: 200 ms
TZAT-0020FASTVT: 1.5 ms
TZON-0003VSLOWVT: 360 ms
TZON-0004VSLOWVT: 36
TZON-0005SLOWVT: 12
TZST-0001FASTVT: 4
TZST-0001SLOWVT: 3
TZST-0001SLOWVT: 5
TZST-0001SLOWVT: 6
TZST-0003FASTVT: 35 J
TZST-0003FASTVT: 35 J
TZST-0003FASTVT: 35 J
TZST-0003SLOWVT: 35 J
TZST-0003SLOWVT: 35 J
TZST-0003SLOWVT: 35 J
VENTRICULAR PACING ICD: 0.01 pct
VF: 0

## 2012-10-15 ENCOUNTER — Encounter: Payer: Self-pay | Admitting: *Deleted

## 2012-10-29 DIAGNOSIS — I509 Heart failure, unspecified: Secondary | ICD-10-CM | POA: Diagnosis not present

## 2012-10-29 DIAGNOSIS — I10 Essential (primary) hypertension: Secondary | ICD-10-CM | POA: Diagnosis not present

## 2012-11-09 ENCOUNTER — Telehealth: Payer: Self-pay | Admitting: *Deleted

## 2012-11-09 NOTE — Telephone Encounter (Signed)
Message copied by Eustace Moore on Tue Nov 09, 2012  9:53 AM ------      Message from: Rollene Rotunda      Created: Sun Nov 07, 2012  4:48 PM       Labs OK.  Call Ms. Sean with the results and send results to Theodoro Kos, MD       ------

## 2012-11-09 NOTE — Telephone Encounter (Signed)
Patient informed. 

## 2012-11-10 ENCOUNTER — Encounter: Payer: Self-pay | Admitting: Cardiology

## 2012-11-10 ENCOUNTER — Ambulatory Visit (INDEPENDENT_AMBULATORY_CARE_PROVIDER_SITE_OTHER): Payer: Medicare Other | Admitting: Cardiology

## 2012-11-10 VITALS — BP 120/71 | HR 87 | Ht 67.0 in | Wt 310.0 lb

## 2012-11-10 DIAGNOSIS — O903 Peripartum cardiomyopathy: Secondary | ICD-10-CM

## 2012-11-10 DIAGNOSIS — I509 Heart failure, unspecified: Secondary | ICD-10-CM

## 2012-11-10 DIAGNOSIS — I10 Essential (primary) hypertension: Secondary | ICD-10-CM | POA: Diagnosis not present

## 2012-11-10 DIAGNOSIS — I472 Ventricular tachycardia, unspecified: Secondary | ICD-10-CM

## 2012-11-10 NOTE — Progress Notes (Signed)
HPI The patient has a history of a postpartum cardiomyopathy in [place]. She has not been seen in this clinic before.  Her cardiomyopathy was diagnosed in 1996 and she says her EF was about 20%. After treatment it went up to about 45%. There may have been a point where it was 60% to possibly 50%. In 2002 she was living in [facility] and had sustained ventricular tachycardia requiring placement of an ICD.  Earlier this year I sent her for a MUGA which demonstrated an EF of about 35%.   Since I last saw her she has done well.  She has had one episode of low blood pressure since I last saw her. Otherwise her blood pressures have been controlled since adding spironolactone at her previous visit. She has had recent followup of her blood work.   Allergies  Allergen Reactions  . Morphine And Related Nausea Only and Other (See Comments)    Feels sick,and like she is going to pass out    Current Outpatient Prescriptions  Medication Sig Dispense Refill  . carvedilol (COREG) 25 MG tablet TAKE 1 & 1/2 TABLETS BY MOUTH TWICE DAILY WITH A MEAL  90 tablet  3  . furosemide (LASIX) 20 MG tablet Take 1 tablet (20 mg total) by mouth daily as needed.  30 tablet  3  . lisinopril (PRINIVIL,ZESTRIL) 20 MG tablet TAKE 1 TABLET BY MOUTH TWICE DAILY  60 tablet  1  . Multiple Vitamin (MULTIVITAMIN) tablet Take 1 tablet by mouth daily.      Marland Kitchen spironolactone (ALDACTONE) 25 MG tablet Take 1 tablet (25 mg total) by mouth daily.  90 tablet  3   No current facility-administered medications for this visit.    Past Medical History  Diagnosis Date  . Postpartum cardiomyopathy     Last echocardiogram was last year and EF 45 -50 %  . Unspecified essential hypertension   . Seasonal allergies   . Palpitations   . Ventricular tachycardia     s/p ICD Implantation (MDT Secura VR)    Past Surgical History  Procedure Laterality Date  . Cardiac defibrillator placement  08/12/2000, 11/11/2008    Most recent  Generator MDT Secura VR placed in [place]  . Cholecystectomy    . Cesarean section      ROS:  As stated in the HPI and negative for all other systems.   PHYSICAL EXAM BP 120/71  Pulse 87  Ht 5\' 7"  (1.702 m)  Wt 310 lb (140.615 kg)  BMI 48.54 kg/m2 GENERAL:  Well appearing HEENT:  Pupils equal round and reactive, fundi not visualized, oral mucosa unremarkable NECK:  No jugular venous distention, waveform within normal limits, carotid upstroke brisk and symmetric, no bruits, no thyromegaly LUNGS:  Clear to auscultation bilaterally CHEST:  Well healed ICD pocket HEART:  PMI not displaced or sustained,S1 and S2 within normal limits, no S3, no S4, no clicks, no rubs, no murmurs ABD:  Flat, positive bowel sounds normal in frequency in pitch, no bruits, no rebound, no guarding, no midline pulsatile mass, no hepatomegaly, no splenomegaly, obese EXT:  2 plus pulses throughout, no edema, no cyanosis no clubbing, obese EXT:  No edema  EKG:  Sinus rhythm, rate 87, low-voltage limb leads, poor anterior R wave progression, no acute ST-T wave changes. No change from previous. 11/10/2012   ASSESSMENT AND PLAN  Peripartum cardiomyopathy - She 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 She will continue on the meds as listed.   Hypertension - This  is being managed in the context of treating his CHF  Obesity - The patient understands the need to lose weight with diet and exercise. We have discussed specific strategies for this.  She has recently started walking.   ICD - She has had followup in the ICD clinic.

## 2012-11-10 NOTE — Patient Instructions (Addendum)
Your physician wants you to follow-up in: 6 MONTHS WITH DR. HOCHREIN IN THE [place] OFFICE IF POSSIBLE. You will receive a reminder letter in the mail two months in advance. If you don't receive a letter, please call our office to schedule the follow-up appointment.   NO CHANGES WERE MADE TODAY

## 2012-12-17 ENCOUNTER — Ambulatory Visit (INDEPENDENT_AMBULATORY_CARE_PROVIDER_SITE_OTHER): Payer: Medicare Other | Admitting: Internal Medicine

## 2012-12-17 ENCOUNTER — Encounter: Payer: Self-pay | Admitting: Internal Medicine

## 2012-12-17 VITALS — BP 134/79 | HR 88 | Ht 67.0 in | Wt 307.0 lb

## 2012-12-17 DIAGNOSIS — I472 Ventricular tachycardia, unspecified: Secondary | ICD-10-CM

## 2012-12-17 DIAGNOSIS — R0609 Other forms of dyspnea: Secondary | ICD-10-CM

## 2012-12-17 DIAGNOSIS — R0989 Other specified symptoms and signs involving the circulatory and respiratory systems: Secondary | ICD-10-CM

## 2012-12-17 DIAGNOSIS — I10 Essential (primary) hypertension: Secondary | ICD-10-CM

## 2012-12-17 DIAGNOSIS — I509 Heart failure, unspecified: Secondary | ICD-10-CM

## 2012-12-17 DIAGNOSIS — O903 Peripartum cardiomyopathy: Secondary | ICD-10-CM | POA: Diagnosis not present

## 2012-12-17 DIAGNOSIS — R0683 Snoring: Secondary | ICD-10-CM | POA: Insufficient documentation

## 2012-12-17 DIAGNOSIS — R5383 Other fatigue: Secondary | ICD-10-CM | POA: Insufficient documentation

## 2012-12-17 DIAGNOSIS — R5381 Other malaise: Secondary | ICD-10-CM

## 2012-12-17 LAB — ICD DEVICE OBSERVATION
CHARGE TIME: 9.629 s
DEV-0020ICD: NEGATIVE
RV LEAD AMPLITUDE: 5.1 mv
RV LEAD IMPEDENCE ICD: 494 Ohm
RV LEAD THRESHOLD: 1 V
TOT-0001: 1
TOT-0002: 0
TZAT-0004FASTVT: 8
TZAT-0005SLOWVT: 88 pct
TZAT-0011SLOWVT: 10 ms
TZAT-0012FASTVT: 200 ms
TZAT-0012SLOWVT: 200 ms
TZAT-0013FASTVT: 1
TZAT-0019SLOWVT: 8 V
TZAT-0020FASTVT: 1.5 ms
TZON-0003SLOWVT: 330 ms
TZON-0003VSLOWVT: 360 ms
TZON-0004SLOWVT: 32
TZON-0004VSLOWVT: 36
TZON-0005SLOWVT: 12
TZST-0001FASTVT: 3
TZST-0001FASTVT: 4
TZST-0001FASTVT: 5
TZST-0001SLOWVT: 3
TZST-0001SLOWVT: 5
TZST-0001SLOWVT: 6
TZST-0003FASTVT: 35 J
TZST-0003FASTVT: 35 J
TZST-0003SLOWVT: 35 J

## 2012-12-17 MED ORDER — FUROSEMIDE 20 MG PO TABS: 20.0000 mg | ORAL_TABLET | ORAL | Status: DC

## 2012-12-17 NOTE — Progress Notes (Signed)
  PCP: Theodoro Kos, MD Primary Cardiologist: Jeraldine Primeau is a 47 y.o. female who presents today for routine electrophysiology followup.  Since last being seen in our clinic, the patient reports doing very well. She has been seen recently by Dr Antoine Poche who felt she was doing well.  She reports that she does have fatigue.  She does not feel well rested in the the morning.  She snores.    Today, she denies symptoms of palpitations, chest pain, shortness of breath,  lower extremity edema, dizziness, presyncope, syncope, or ICD shocks.  The patient is otherwise without complaint today.   Past Medical History  Diagnosis Date  . Postpartum cardiomyopathy     Last echocardiogram was last year and EF 45 -50 %  . Unspecified essential hypertension   . Seasonal allergies   . Palpitations   . Ventricular tachycardia     s/p ICD Implantation (MDT Secura VR)   Past Surgical History  Procedure Laterality Date  . Cardiac defibrillator placement  08/12/2000, 11/11/2008    Most recent Generator MDT Secura VR placed in [place]  . Cholecystectomy    . Cesarean section      Current Outpatient Prescriptions  Medication Sig Dispense Refill  . carvedilol (COREG) 25 MG tablet       . furosemide (LASIX) 20 MG tablet Take 1 tablet (20 mg total) by mouth daily as needed.  30 tablet  3  . lisinopril (PRINIVIL,ZESTRIL) 20 MG tablet TAKE 1 TABLET BY MOUTH TWICE DAILY  60 tablet  1  . Multiple Vitamin (MULTIVITAMIN) tablet Take 1 tablet by mouth daily.      Marland Kitchen spironolactone (ALDACTONE) 25 MG tablet Take 1 tablet (25 mg total) by mouth daily.  90 tablet  3   No current facility-administered medications for this visit.    Physical Exam: Filed Vitals:   12/17/12 0928  BP: 134/79  Pulse: 88  Height: 5\' 7"  (1.702 m)  Weight: 307 lb (139.254 kg)    GEN- The patient is overweight appearing, alert and oriented x 3 today.   Head- normocephalic, atraumatic Eyes-  Sclera clear, conjunctiva  pink Ears- hearing intact Oropharynx- clear Lungs- Clear to ausculation bilaterally, normal work of breathing Chest- ICD pocket is well healed Heart- Regular rate and rhythm, no murmurs, rubs or gallops, PMI not laterally displaced GI- soft, NT, ND, + BS Extremities- no clubbing, cyanosis, or edema  ICD interrogation- reviewed in detail today,  See PACEART report  Assessment and Plan:  1. Postpartum cardiomyopathy  Mildly volume overloaded on exam and optivol is elevated.  She will take lasix 20mg  daily x 5 days and then every other day thereafter. 2 gram sodium restriction and daily weights  Followup with Dr Antoine Poche in 6 weeks with a BMET   2. Ventricular tachycardia  Normal ICD function today See Pace Art report  3. Fatigue/ snoring Will refer to Dr Andrey Campanile for sleep study  Carelink every 3 months Return in 1year to see me in the [place] device clinic

## 2012-12-17 NOTE — Patient Instructions (Signed)
   Referral to Dr. Andrey Campanile for evaluation of sleep apnea  Increase Lasix to 20mg  DAILY x 5 days only, then change to every other day thereafter - new sent to pharm Continue all other medications.   Lab for BMET just prior to office visit with Dr. Antoine Poche Follow up in  6 weeks in [place] with Dr. Antoine Poche

## 2012-12-29 ENCOUNTER — Other Ambulatory Visit: Payer: Self-pay | Admitting: Cardiology

## 2013-01-13 ENCOUNTER — Telehealth: Payer: Self-pay | Admitting: Internal Medicine

## 2013-01-13 NOTE — Telephone Encounter (Signed)
Erin Jarvis has an appointment with Dr. Cala Bradford on November 5th 2014 @ 145pm For evaluation of sleep apnea.

## 2013-01-27 ENCOUNTER — Other Ambulatory Visit: Payer: Self-pay | Admitting: Internal Medicine

## 2013-01-27 DIAGNOSIS — I10 Essential (primary) hypertension: Secondary | ICD-10-CM | POA: Diagnosis not present

## 2013-01-27 DIAGNOSIS — I509 Heart failure, unspecified: Secondary | ICD-10-CM | POA: Diagnosis not present

## 2013-01-27 LAB — BASIC METABOLIC PANEL
BUN: 12 mg/dL (ref 6–23)
Chloride: 100 mEq/L (ref 96–112)
Creat: 0.87 mg/dL (ref 0.50–1.10)

## 2013-02-01 ENCOUNTER — Other Ambulatory Visit: Payer: Self-pay | Admitting: Cardiology

## 2013-02-02 ENCOUNTER — Ambulatory Visit (INDEPENDENT_AMBULATORY_CARE_PROVIDER_SITE_OTHER): Payer: Medicare Other | Admitting: Cardiology

## 2013-02-02 ENCOUNTER — Encounter: Payer: Self-pay | Admitting: Cardiology

## 2013-02-02 VITALS — BP 150/99 | HR 93 | Ht 67.0 in | Wt 305.0 lb

## 2013-02-02 DIAGNOSIS — Z79899 Other long term (current) drug therapy: Secondary | ICD-10-CM

## 2013-02-02 DIAGNOSIS — I509 Heart failure, unspecified: Secondary | ICD-10-CM | POA: Diagnosis not present

## 2013-02-02 NOTE — Patient Instructions (Addendum)
The current medical regimen is effective;  continue present plan and medications.  Please have blood work in 3 weeks.  (BMP)  Follow up in March 2015 with Dr Antoine Poche.  You will receive a letter in the mail 2 months before you are due.  Please call us when you receive this letter to schedule your follow up appointment.

## 2013-02-02 NOTE — Progress Notes (Signed)
HPI The patient has a history of a postpartum cardiomyopathy in [place]. Her cardiomyopathy was diagnosed in 1996 and she says her EF was about 20%. After treatment it went up to about 45%. While living in [facility] and had sustained ventricular tachycardia requiring placement of an ICD.  Earlier this year I sent her for a MUGA which demonstrated an EF of about 35%.    Since I last saw her she had a defibrillator check. She was complaining of some tiredness and so was set up by Dr. Johney Frame to have an evaluation for sleep study. Her Optivol do suggest some excess fluid and so she was put on every other day diuretic. However, she says that at that time she had been driving quite a bit and probably did retain some fluid. She doesn't feel like she's doing this now is she's been having any new shortness of breath, PND or orthopnea. She's had no palpitations, presyncope or syncope.  Allergies  Allergen Reactions  . Morphine And Related Nausea Only and Other (See Comments)    Feels sick,and like she is going to pass out    Current Outpatient Prescriptions  Medication Sig Dispense Refill  . carvedilol (COREG) 25 MG tablet 25 mg 2 (two) times daily.       . furosemide (LASIX) 20 MG tablet Take 1 tablet (20 mg total) by mouth every other day.  30 tablet  6  . Multiple Vitamin (MULTIVITAMIN) tablet Take 1 tablet by mouth daily.      Marland Kitchen spironolactone (ALDACTONE) 25 MG tablet Take 1 tablet (25 mg total) by mouth daily.  90 tablet  3  . lisinopril (PRINIVIL,ZESTRIL) 20 MG tablet TAKE ONE TABLET TWICE DAILY  60 tablet  6   No current facility-administered medications for this visit.    Past Medical History  Diagnosis Date  . Postpartum cardiomyopathy     Last echocardiogram was last year and EF 45 -50 %  . Unspecified essential hypertension   . Seasonal allergies   . Palpitations   . Ventricular tachycardia     s/p ICD Implantation (MDT Secura VR)    Past Surgical History    Procedure Laterality Date  . Cardiac defibrillator placement  08/12/2000, 11/11/2008    Most recent Generator MDT Secura VR placed in [place]  . Cholecystectomy    . Cesarean section      ROS:  As stated in the HPI and negative for all other systems.   PHYSICAL EXAM BP 150/99  Pulse 93  Ht 5\' 7"  (1.702 m)  Wt 305 lb (138.347 kg)  BMI 47.76 kg/m2 GENERAL:  Well appearing HEENT:  Pupils equal round and reactive, fundi not visualized, oral mucosa unremarkable NECK:  No jugular venous distention, waveform within normal limits, carotid upstroke brisk and symmetric, no bruits, no thyromegaly LUNGS:  Clear to auscultation bilaterally CHEST:  Well healed ICD pocket HEART:  PMI not displaced or sustained,S1 and S2 within normal limits, no S3, no S4, no clicks, no rubs, no murmurs ABD:  Flat, positive bowel sounds normal in frequency in pitch, no bruits, no rebound, no guarding, no midline pulsatile mass, no hepatomegaly, no splenomegaly, obese EXT:  2 plus pulses throughout, no edema, no cyanosis no clubbing, obese EXT:  No edema   ASSESSMENT AND PLAN  Peripartum cardiomyopathy - She 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 She will continue on the meds as listed. I think she can go back to taking a diuretic when necessary  Hypertension -  She tolerated spironolactone.  Her basic metabolic profile on the 31st was normal.  She will have appropriate followup of the labs. We rrange routine followup of this.  Obesity - The patient understands the need to lose weight with diet and exercise. We have discussed specific strategies for this.  ICD - She has now had followup in the ICD clinic.

## 2013-02-16 DIAGNOSIS — I10 Essential (primary) hypertension: Secondary | ICD-10-CM | POA: Diagnosis not present

## 2013-03-01 ENCOUNTER — Other Ambulatory Visit: Payer: Self-pay | Admitting: Cardiology

## 2013-03-21 ENCOUNTER — Ambulatory Visit (INDEPENDENT_AMBULATORY_CARE_PROVIDER_SITE_OTHER): Payer: Medicare Other | Admitting: *Deleted

## 2013-03-21 DIAGNOSIS — O903 Peripartum cardiomyopathy: Secondary | ICD-10-CM

## 2013-03-21 DIAGNOSIS — I509 Heart failure, unspecified: Secondary | ICD-10-CM | POA: Diagnosis not present

## 2013-03-27 DIAGNOSIS — Z6841 Body Mass Index (BMI) 40.0 and over, adult: Secondary | ICD-10-CM | POA: Diagnosis not present

## 2013-03-27 DIAGNOSIS — G4733 Obstructive sleep apnea (adult) (pediatric): Secondary | ICD-10-CM | POA: Diagnosis not present

## 2013-04-01 DIAGNOSIS — G4730 Sleep apnea, unspecified: Secondary | ICD-10-CM | POA: Diagnosis not present

## 2013-04-01 DIAGNOSIS — G4733 Obstructive sleep apnea (adult) (pediatric): Secondary | ICD-10-CM | POA: Diagnosis not present

## 2013-04-05 LAB — MDC_IDC_ENUM_SESS_TYPE_REMOTE
Battery Voltage: 3.09 V
Brady Statistic RV Percent Paced: 0.02 %
Date Time Interrogation Session: 20141222171436
HighPow Impedance: 51 Ohm
HighPow Impedance: 65 Ohm
Lead Channel Impedance Value: 475 Ohm
Lead Channel Pacing Threshold Amplitude: 1.625 V
Lead Channel Pacing Threshold Pulse Width: 0.4 ms
Lead Channel Sensing Intrinsic Amplitude: 4.875 mV
Lead Channel Setting Pacing Amplitude: 3.5 V
Lead Channel Setting Pacing Pulse Width: 0.4 ms
Lead Channel Setting Sensing Sensitivity: 0.3 mV
Zone Setting Detection Interval: 240 ms
Zone Setting Detection Interval: 300 ms
Zone Setting Detection Interval: 330 ms
Zone Setting Detection Interval: 360 ms

## 2013-04-12 ENCOUNTER — Encounter: Payer: Self-pay | Admitting: *Deleted

## 2013-04-14 ENCOUNTER — Encounter: Payer: Self-pay | Admitting: Internal Medicine

## 2013-05-03 DIAGNOSIS — Z6841 Body Mass Index (BMI) 40.0 and over, adult: Secondary | ICD-10-CM | POA: Diagnosis not present

## 2013-05-03 DIAGNOSIS — G4733 Obstructive sleep apnea (adult) (pediatric): Secondary | ICD-10-CM | POA: Diagnosis not present

## 2013-05-03 DIAGNOSIS — G4761 Periodic limb movement disorder: Secondary | ICD-10-CM | POA: Diagnosis not present

## 2013-05-06 DIAGNOSIS — G4730 Sleep apnea, unspecified: Secondary | ICD-10-CM | POA: Diagnosis not present

## 2013-06-15 DIAGNOSIS — S8990XA Unspecified injury of unspecified lower leg, initial encounter: Secondary | ICD-10-CM | POA: Diagnosis not present

## 2013-06-15 DIAGNOSIS — W108XXA Fall (on) (from) other stairs and steps, initial encounter: Secondary | ICD-10-CM | POA: Diagnosis not present

## 2013-06-15 DIAGNOSIS — S99919A Unspecified injury of unspecified ankle, initial encounter: Secondary | ICD-10-CM | POA: Diagnosis not present

## 2013-06-15 DIAGNOSIS — M25569 Pain in unspecified knee: Secondary | ICD-10-CM | POA: Diagnosis not present

## 2013-06-15 DIAGNOSIS — S8000XA Contusion of unspecified knee, initial encounter: Secondary | ICD-10-CM | POA: Diagnosis not present

## 2013-06-15 DIAGNOSIS — I10 Essential (primary) hypertension: Secondary | ICD-10-CM | POA: Diagnosis not present

## 2013-06-15 DIAGNOSIS — Z79899 Other long term (current) drug therapy: Secondary | ICD-10-CM | POA: Diagnosis not present

## 2013-06-22 ENCOUNTER — Ambulatory Visit (INDEPENDENT_AMBULATORY_CARE_PROVIDER_SITE_OTHER): Payer: Medicare Other | Admitting: *Deleted

## 2013-06-22 ENCOUNTER — Encounter: Payer: Self-pay | Admitting: Internal Medicine

## 2013-06-22 DIAGNOSIS — I472 Ventricular tachycardia, unspecified: Secondary | ICD-10-CM

## 2013-06-22 DIAGNOSIS — I509 Heart failure, unspecified: Secondary | ICD-10-CM | POA: Diagnosis not present

## 2013-06-22 DIAGNOSIS — I4729 Other ventricular tachycardia: Secondary | ICD-10-CM | POA: Diagnosis not present

## 2013-06-22 LAB — MDC_IDC_ENUM_SESS_TYPE_REMOTE
Battery Voltage: 3.06 V
Brady Statistic RV Percent Paced: 0.1 % — CL
HIGH POWER IMPEDANCE MEASURED VALUE: 65 Ohm
HighPow Impedance: 53 Ohm
Lead Channel Impedance Value: 475 Ohm
Lead Channel Pacing Threshold Amplitude: 1.25 V
Lead Channel Sensing Intrinsic Amplitude: 5.9 mV
Lead Channel Setting Pacing Amplitude: 3.5 V
Lead Channel Setting Pacing Pulse Width: 0.4 ms
MDC IDC MSMT LEADCHNL RV PACING THRESHOLD PULSEWIDTH: 0.4 ms
MDC IDC SET LEADCHNL RV SENSING SENSITIVITY: 0.3 mV
MDC IDC SET ZONE DETECTION INTERVAL: 300 ms
MDC IDC SET ZONE DETECTION INTERVAL: 330 ms
Zone Setting Detection Interval: 240 ms
Zone Setting Detection Interval: 360 ms

## 2013-08-17 DIAGNOSIS — G4733 Obstructive sleep apnea (adult) (pediatric): Secondary | ICD-10-CM | POA: Diagnosis not present

## 2013-09-23 ENCOUNTER — Other Ambulatory Visit: Payer: Self-pay | Admitting: Cardiology

## 2013-09-26 ENCOUNTER — Ambulatory Visit (INDEPENDENT_AMBULATORY_CARE_PROVIDER_SITE_OTHER): Payer: Medicare Other | Admitting: *Deleted

## 2013-09-26 DIAGNOSIS — I509 Heart failure, unspecified: Secondary | ICD-10-CM

## 2013-09-26 DIAGNOSIS — O903 Peripartum cardiomyopathy: Secondary | ICD-10-CM | POA: Diagnosis not present

## 2013-09-26 NOTE — Progress Notes (Signed)
Remote ICD transmission.   

## 2013-09-28 LAB — MDC_IDC_ENUM_SESS_TYPE_REMOTE
Date Time Interrogation Session: 20150629154751
HighPow Impedance: 52 Ohm
HighPow Impedance: 63 Ohm
Lead Channel Pacing Threshold Amplitude: 1.75 V
Lead Channel Pacing Threshold Pulse Width: 0.4 ms
Lead Channel Sensing Intrinsic Amplitude: 5.25 mV
Lead Channel Sensing Intrinsic Amplitude: 5.25 mV
Lead Channel Setting Pacing Pulse Width: 0.4 ms
MDC IDC MSMT BATTERY VOLTAGE: 3.08 V
MDC IDC MSMT LEADCHNL RV IMPEDANCE VALUE: 532 Ohm
MDC IDC SET LEADCHNL RV PACING AMPLITUDE: 3.5 V
MDC IDC SET LEADCHNL RV SENSING SENSITIVITY: 0.3 mV
MDC IDC SET ZONE DETECTION INTERVAL: 360 ms
MDC IDC STAT BRADY RV PERCENT PACED: 0.01 %
Zone Setting Detection Interval: 240 ms
Zone Setting Detection Interval: 300 ms
Zone Setting Detection Interval: 330 ms

## 2013-10-05 ENCOUNTER — Encounter: Payer: Self-pay | Admitting: Cardiology

## 2013-10-10 ENCOUNTER — Encounter: Payer: Self-pay | Admitting: Internal Medicine

## 2013-10-31 ENCOUNTER — Other Ambulatory Visit: Payer: Self-pay | Admitting: Cardiology

## 2013-11-22 DIAGNOSIS — Z79899 Other long term (current) drug therapy: Secondary | ICD-10-CM | POA: Diagnosis not present

## 2013-11-22 DIAGNOSIS — E860 Dehydration: Secondary | ICD-10-CM | POA: Diagnosis not present

## 2013-11-22 DIAGNOSIS — N390 Urinary tract infection, site not specified: Secondary | ICD-10-CM | POA: Diagnosis not present

## 2013-11-22 DIAGNOSIS — I129 Hypertensive chronic kidney disease with stage 1 through stage 4 chronic kidney disease, or unspecified chronic kidney disease: Secondary | ICD-10-CM | POA: Diagnosis not present

## 2013-11-22 DIAGNOSIS — K7689 Other specified diseases of liver: Secondary | ICD-10-CM | POA: Diagnosis not present

## 2013-11-22 DIAGNOSIS — N269 Renal sclerosis, unspecified: Secondary | ICD-10-CM | POA: Diagnosis not present

## 2013-11-29 ENCOUNTER — Other Ambulatory Visit: Payer: Self-pay | Admitting: Cardiology

## 2013-12-11 DIAGNOSIS — K219 Gastro-esophageal reflux disease without esophagitis: Secondary | ICD-10-CM | POA: Diagnosis not present

## 2013-12-11 DIAGNOSIS — R109 Unspecified abdominal pain: Secondary | ICD-10-CM | POA: Diagnosis not present

## 2013-12-11 DIAGNOSIS — N390 Urinary tract infection, site not specified: Secondary | ICD-10-CM | POA: Diagnosis not present

## 2013-12-12 DIAGNOSIS — G4730 Sleep apnea, unspecified: Secondary | ICD-10-CM | POA: Diagnosis not present

## 2013-12-12 DIAGNOSIS — N289 Disorder of kidney and ureter, unspecified: Secondary | ICD-10-CM | POA: Diagnosis not present

## 2013-12-12 DIAGNOSIS — K769 Liver disease, unspecified: Secondary | ICD-10-CM | POA: Diagnosis not present

## 2013-12-12 DIAGNOSIS — I509 Heart failure, unspecified: Secondary | ICD-10-CM | POA: Diagnosis not present

## 2013-12-12 DIAGNOSIS — I10 Essential (primary) hypertension: Secondary | ICD-10-CM | POA: Diagnosis not present

## 2013-12-12 DIAGNOSIS — K219 Gastro-esophageal reflux disease without esophagitis: Secondary | ICD-10-CM | POA: Diagnosis not present

## 2013-12-14 DIAGNOSIS — I10 Essential (primary) hypertension: Secondary | ICD-10-CM | POA: Diagnosis not present

## 2013-12-14 DIAGNOSIS — I509 Heart failure, unspecified: Secondary | ICD-10-CM | POA: Diagnosis not present

## 2013-12-14 DIAGNOSIS — E119 Type 2 diabetes mellitus without complications: Secondary | ICD-10-CM | POA: Diagnosis not present

## 2013-12-14 DIAGNOSIS — Z131 Encounter for screening for diabetes mellitus: Secondary | ICD-10-CM | POA: Diagnosis not present

## 2014-01-11 ENCOUNTER — Other Ambulatory Visit: Payer: Self-pay | Admitting: Cardiology

## 2014-01-12 ENCOUNTER — Other Ambulatory Visit: Payer: Self-pay

## 2014-01-20 ENCOUNTER — Encounter: Payer: Self-pay | Admitting: Internal Medicine

## 2014-01-20 ENCOUNTER — Ambulatory Visit (INDEPENDENT_AMBULATORY_CARE_PROVIDER_SITE_OTHER): Payer: Medicare Other | Admitting: Internal Medicine

## 2014-01-20 VITALS — BP 126/87 | HR 94 | Ht 67.0 in | Wt 301.0 lb

## 2014-01-20 DIAGNOSIS — I472 Ventricular tachycardia, unspecified: Secondary | ICD-10-CM

## 2014-01-20 DIAGNOSIS — G4730 Sleep apnea, unspecified: Secondary | ICD-10-CM

## 2014-01-20 DIAGNOSIS — I5022 Chronic systolic (congestive) heart failure: Secondary | ICD-10-CM

## 2014-01-20 DIAGNOSIS — O903 Peripartum cardiomyopathy: Secondary | ICD-10-CM

## 2014-01-20 LAB — MDC_IDC_ENUM_SESS_TYPE_INCLINIC
Battery Voltage: 3.05 V
Brady Statistic RV Percent Paced: 0.01 %
Date Time Interrogation Session: 20151023091446
HIGH POWER IMPEDANCE MEASURED VALUE: 76 Ohm
HighPow Impedance: 56 Ohm
Lead Channel Pacing Threshold Pulse Width: 0.4 ms
Lead Channel Setting Pacing Pulse Width: 0.4 ms
Lead Channel Setting Sensing Sensitivity: 0.3 mV
MDC IDC MSMT LEADCHNL RV IMPEDANCE VALUE: 475 Ohm
MDC IDC MSMT LEADCHNL RV PACING THRESHOLD AMPLITUDE: 1.25 V
MDC IDC MSMT LEADCHNL RV SENSING INTR AMPL: 5.5 mV
MDC IDC SET LEADCHNL RV PACING AMPLITUDE: 2.5 V
MDC IDC SET ZONE DETECTION INTERVAL: 300 ms
Zone Setting Detection Interval: 240 ms
Zone Setting Detection Interval: 330 ms
Zone Setting Detection Interval: 360 ms

## 2014-01-20 NOTE — Patient Instructions (Signed)
Your physician recommends that you schedule a follow-up appointment in: 6 months with Dr. Percival Spanish in [place]. You will receive a reminder letter in the mail in about 4 months reminding you to call and schedule your appointment. If you don't receive this letter, please contact our office. Your physician recommends that you schedule a follow-up appointment in: 1 year with Dr. Rayann Heman. You will receive a reminder letter in the mail in about 10 months reminding you to call and schedule your appointment. If you don't receive this letter, please contact our office. Carelink device check on 04/24/14. Your physician recommends that you continue on your current medications as directed. Please refer to the Current Medication list given to you today.

## 2014-01-21 ENCOUNTER — Encounter: Payer: Self-pay | Admitting: Internal Medicine

## 2014-01-21 DIAGNOSIS — G4730 Sleep apnea, unspecified: Secondary | ICD-10-CM | POA: Insufficient documentation

## 2014-01-21 NOTE — Progress Notes (Signed)
  PCP: [name], MD Primary Cardiologist: Erin Jarvis is a 48 y.o. female who presents today for routine electrophysiology followup.  Since last being seen in our clinic, the patient reports doing very well.  She has been evaluated and diagnosed with sleep apnea.  She is now using CPAP and is very pleased with her clinical improvement.  She continues to struggle with her weight.  Today, she denies symptoms of palpitations, chest pain, shortness of breath,  lower extremity edema, dizziness, presyncope, syncope, or ICD shocks.  The patient is otherwise without complaint today.   Past Medical History  Diagnosis Date  . Postpartum cardiomyopathy     Last echocardiogram was last year and EF 45 -50 %  . Unspecified essential hypertension   . Seasonal allergies   . Palpitations   . Ventricular tachycardia     s/p ICD Implantation (MDT Secura VR)   Past Surgical History  Procedure Laterality Date  . Cardiac defibrillator placement  08/12/2000, 11/11/2008    Most recent Generator MDT Secura VR placed in [place]  . Cholecystectomy    . Cesarean section      Current Outpatient Prescriptions  Medication Sig Dispense Refill  . carvedilol (COREG) 25 MG tablet Take 1&1/2 tablets twice a day with a meal  90 tablet  6  . Fish Oil-Cholecalciferol (FISH OIL + D3 PO) Take 1 capsule by mouth daily.      . furosemide (LASIX) 20 MG tablet Take 1 tablet (20 mg total) by mouth every other day.  30 tablet  6  . lisinopril (PRINIVIL,ZESTRIL) 20 MG tablet TAKE ONE TABLET TWICE DAILY  60 tablet  0  . metFORMIN (GLUCOPHAGE) 1000 MG tablet Take 1,000 mg by mouth 2 (two) times daily with a meal.      . MILK THISTLE PO Take 1 capsule by mouth daily.      . Multiple Vitamin (MULTIVITAMIN) tablet Take 1 tablet by mouth daily.      Marland Kitchen spironolactone (ALDACTONE) 25 MG tablet TAKE ONE TABLET EACH DAY  90 tablet  1   No current facility-administered medications for this visit.    Physical  Exam: Filed Vitals:   01/20/14 0850  BP: 126/87  Pulse: 94  Height: 5\' 7"  (1.702 m)  Weight: 301 lb (136.533 kg)    GEN- The patient is overweight appearing, alert and oriented x 3 today.   Head- normocephalic, atraumatic Eyes-  Sclera clear, conjunctiva pink Ears- hearing intact Oropharynx- clear Lungs- Clear to ausculation bilaterally, normal work of breathing Chest- ICD pocket is well healed Heart- Regular rate and rhythm, no murmurs, rubs or gallops, PMI not laterally displaced GI- soft, NT, ND, + BS Extremities- no clubbing, cyanosis, or edema  ICD interrogation- reviewed in detail today,  See PACEART report  Assessment and Plan:  1. Postpartum cardiomyopathy  clnically stable I will refer to Alvis Lemmings to follow in the [facility] clinic   2. Ventricular tachycardia  Normal ICD function today See Pace Art report  3. OSA Clinically improved with CPAP  4. Obesity Body mass index is 47.13 kg/(m^2). Weight loss was discussed at length today.  She will make efforts to change her lifestyle/ reduce weight  Carelink every 3 months Return in 1year to see me in the [facility] device clinic She will follow-up with Dr Percival Spanish in [place] in 6 months

## 2014-02-01 DIAGNOSIS — D259 Leiomyoma of uterus, unspecified: Secondary | ICD-10-CM | POA: Diagnosis not present

## 2014-02-01 DIAGNOSIS — N924 Excessive bleeding in the premenopausal period: Secondary | ICD-10-CM | POA: Diagnosis not present

## 2014-02-01 DIAGNOSIS — Z124 Encounter for screening for malignant neoplasm of cervix: Secondary | ICD-10-CM | POA: Diagnosis not present

## 2014-02-02 ENCOUNTER — Telehealth: Payer: Self-pay | Admitting: *Deleted

## 2014-02-02 NOTE — Telephone Encounter (Signed)
I left a message for the patient to call regarding ICM clinic enrollment.

## 2014-02-10 NOTE — Telephone Encounter (Signed)
I left a message for the patient to call for ICM enrollment.

## 2014-02-10 NOTE — Telephone Encounter (Signed)
I spoke with the patient and have scheduled her for [facility] clinic.

## 2014-02-16 DIAGNOSIS — N924 Excessive bleeding in the premenopausal period: Secondary | ICD-10-CM | POA: Diagnosis not present

## 2014-02-16 DIAGNOSIS — D259 Leiomyoma of uterus, unspecified: Secondary | ICD-10-CM | POA: Diagnosis not present

## 2014-02-20 ENCOUNTER — Other Ambulatory Visit: Payer: Self-pay | Admitting: Obstetrics and Gynecology

## 2014-02-20 DIAGNOSIS — D259 Leiomyoma of uterus, unspecified: Secondary | ICD-10-CM

## 2014-03-08 ENCOUNTER — Other Ambulatory Visit: Payer: Medicare Other

## 2014-03-09 ENCOUNTER — Other Ambulatory Visit: Payer: Self-pay | Admitting: Emergency Medicine

## 2014-03-09 ENCOUNTER — Ambulatory Visit
Admission: RE | Admit: 2014-03-09 | Discharge: 2014-03-09 | Disposition: A | Discharge status: Home / Self Care | Payer: Medicare Other | Source: Ambulatory Visit | Attending: Obstetrics and Gynecology | Admitting: Obstetrics and Gynecology

## 2014-03-09 VITALS — BP 141/86 | HR 96 | Temp 98.9°F | Resp 19

## 2014-03-09 DIAGNOSIS — D259 Leiomyoma of uterus, unspecified: Secondary | ICD-10-CM | POA: Diagnosis not present

## 2014-03-09 DIAGNOSIS — E118 Type 2 diabetes mellitus with unspecified complications: Secondary | ICD-10-CM

## 2014-03-09 HISTORY — DX: Presence of automatic (implantable) cardiac defibrillator: Z95.810

## 2014-03-09 HISTORY — DX: Heart failure, unspecified: I50.9

## 2014-03-09 HISTORY — DX: Essential (primary) hypertension: I10

## 2014-03-09 HISTORY — DX: Type 2 diabetes mellitus without complications: E11.9

## 2014-03-09 LAB — BUN: BUN: 11 mg/dL (ref 6–23)

## 2014-03-09 LAB — CREATININE WITH EST GFR
CREATININE: 0.89 mg/dL (ref 0.50–1.10)
GFR, EST AFRICAN AMERICAN: 87 mL/min
GFR, Est Non African American: 76 mL/min

## 2014-03-09 NOTE — Consult Note (Signed)
Chief Complaint: Systematic uterine fibroids, pelvic pain and pressure, intermittent abnormal menstrual bleeding  Referring Physician(s): Galloway,Dionne  History of Present Illness: Erin Jarvis is a 48 y.o. female with a dominant symptomatic uterine fibroid. She has associated lower abdominal and pelvic crampy abdominal pain. Menstrual cycle is irregular. Last menstrual cycle 02/17/2014. Menstrual cycle last 8 days with 4 heavy days requiring both pads and tampons. This requires changing every hour. No interperiod Bleeding or spotting. Fibroids diagnosed recently 02/17/2014 by ultrasound and CT. She reports passage of blood clots during her menstrual cycle. Symptoms include lower abdominal and pelvic pain worse during the cycle. She currently is not taking any birth control pills or hormone replacement therapies. No previous fibroid surgeries or GYN infections. Negative Pap smear 02/01/2014. Ultrasound 02/17/2014 demonstrated a 15 cm intramural midline fundal fibroid. MRI cannot be performed because the patient has a pacemaker/AICD for a history of postpartum cardiomyopathy and heart failure.  Past Medical History  Diagnosis Date  . Postpartum cardiomyopathy     Last echocardiogram was last year and EF 45 -50 %  . Hypertensive cardiovascular disease   . Seasonal allergies   . Palpitations   . Ventricular tachycardia     s/p ICD Implantation (MDT Secura VR)  . Sleep apnea     complaint with CPAP  . Obesity   . Diabetes mellitus without complication   . Hypertension   . AICD (automatic cardioverter/defibrillator) present 2002, 2010    secondary to post-partum cardiomyopathy (1996)  . Presence of permanent cardiac pacemaker   . CHF (congestive heart failure)   . Shortness of breath dyspnea     Past Surgical History  Procedure Laterality Date  . Cardiac defibrillator placement  08/12/2000, 11/11/2008    Most recent Generator MDT Secura VR placed in [place]  . Cesarean  section  1996  . Cholecystectomy  2004    Allergies: Morphine and related  Medications: Prior to Admission medications   Medication Sig Start Date End Date Taking? Authorizing Provider  carvedilol (COREG) 25 MG tablet Take 1&1/2 tablets twice a day with a meal 03/02/13  Yes Minus Breeding, MD  Fish Oil-Cholecalciferol (FISH OIL + D3 PO) Take 1 capsule by mouth daily.   Yes Historical Provider, MD  furosemide (LASIX) 20 MG tablet Take 1 tablet (20 mg total) by mouth every other day. 12/17/12  Yes Coralyn Mark, MD  lisinopril (PRINIVIL,ZESTRIL) 20 MG tablet TAKE ONE TABLET TWICE DAILY 11/30/13  Yes Minus Breeding, MD  metFORMIN (GLUCOPHAGE) 1000 MG tablet Take 1,000 mg by mouth 2 (two) times daily with a meal.   Yes Historical Provider, MD  MILK THISTLE PO Take 1 capsule by mouth daily.   Yes Historical Provider, MD  Multiple Vitamin (MULTIVITAMIN) tablet Take 1 tablet by mouth daily.   Yes Historical Provider, MD  spironolactone (ALDACTONE) 25 MG tablet TAKE ONE TABLET EACH DAY Patient not taking: Reported on 03/09/2014 01/12/14   Minus Breeding, MD    Family History  Problem Relation Age of Onset  . Other Mother     has chronic pain syndrome from DDD with spinal cord stimulator    History   Social History  . Marital Status: Divorced    Spouse Name: N/A    Number of Children: N/A  . Years of Education: N/A   Social History Main Topics  . Smoking status: Never Smoker   . Smokeless tobacco: Never Used  . Alcohol Use: No  . Drug Use: No  .  Sexual Activity: Not on file   Other Topics Concern  . Not on file   Social History Narrative   Lives with her parents and 44 year old daughter in [place].    Review of Systems: A 12 point ROS discussed and pertinent positives are indicated in the HPI above.  All other systems are negative.  Review of Systems  Constitutional: Negative for fever, chills, diaphoresis and fatigue.       Morbidly obese female in no acute distress.    Respiratory: Negative for cough, chest tightness and wheezing.   Cardiovascular: Negative for chest pain and palpitations.  Gastrointestinal: Negative for abdominal distention.  Genitourinary: Positive for urgency and frequency. Negative for dysuria, flank pain and difficulty urinating.    Vital Signs: BP 141/86 mmHg  Pulse 96  Temp(Src) 98.9 F (37.2 C)  Resp 19  SpO2 93%  LMP 02/17/2014  Physical Exam  Constitutional: She appears well-developed and well-nourished. No distress.  Morbidly obese female in no acute distress.  Cardiovascular: Normal rate and normal heart sounds.   No murmur heard. Pulmonary/Chest: Effort normal and breath sounds normal. No respiratory distress. She has no wheezes.  Abdominal: Soft. Bowel sounds are normal. She exhibits no distension.  Obese abdomen, no palpable organomegaly. 15 cm uterine fibroid is not palpable.  Skin: She is not diaphoretic.  Psychiatric: She has a normal mood and affect. Her behavior is normal.    Imaging: Previous ultrasound 02/17/2014 demonstrates a dominant fundal transmural 15 cm fibroid. There is a comparison noncontrast abdomen pelvis CT again demonstrating the midline fundal dominant fibroid.   Assessment and Plan:  Dominant 15 cm midline uterine fibroid with abnormal menstrual bleeding, lower abdominal pelvic pain and cramping. Symptoms have progressed over the last few months with passage of blood clots. She is a high risk surgical candidate because of morbid obesity, postpartum cardiomyopathy, associated congestive heart failure, and an implantable AICD. The uterine fibroid embolization procedure, risks, benefits and alternatives were discussed at length with the patient. All questions were addressed. She would like to proceed with the workup which would include a pelvis CT without and with contrast to confirm fibroid enhancement. Her anatomy by ultrasound and CT is amenable to fibroid embolization. Pelvis CT will be  scheduled at [facility] because the patient lives in [place].  Thank you for this interesting consult.  I greatly enjoyed meeting [employer] and look forward to participating in their care.  I spent a total of 30 minutes face to face in clinical consultation, greater than 50% of which was counseling/coordinating care for the patient and discussing the uterine fibroid embolization procedure.  SignedGreggory Keen T. 03/09/2014, 12:34 PM

## 2014-03-13 DIAGNOSIS — I10 Essential (primary) hypertension: Secondary | ICD-10-CM | POA: Diagnosis not present

## 2014-03-13 DIAGNOSIS — I5032 Chronic diastolic (congestive) heart failure: Secondary | ICD-10-CM | POA: Diagnosis not present

## 2014-03-13 DIAGNOSIS — E1165 Type 2 diabetes mellitus with hyperglycemia: Secondary | ICD-10-CM | POA: Diagnosis not present

## 2014-03-16 ENCOUNTER — Encounter: Payer: Self-pay | Admitting: *Deleted

## 2014-03-16 ENCOUNTER — Telehealth: Payer: Self-pay | Admitting: Cardiology

## 2014-03-16 ENCOUNTER — Other Ambulatory Visit: Payer: Self-pay | Admitting: Cardiology

## 2014-03-16 ENCOUNTER — Ambulatory Visit (INDEPENDENT_AMBULATORY_CARE_PROVIDER_SITE_OTHER): Payer: Medicare Other | Admitting: *Deleted

## 2014-03-16 ENCOUNTER — Telehealth: Payer: Self-pay | Admitting: *Deleted

## 2014-03-16 DIAGNOSIS — O903 Peripartum cardiomyopathy: Secondary | ICD-10-CM | POA: Diagnosis not present

## 2014-03-16 DIAGNOSIS — Z95810 Presence of automatic (implantable) cardiac defibrillator: Secondary | ICD-10-CM | POA: Diagnosis not present

## 2014-03-16 NOTE — Telephone Encounter (Signed)
LMOVM reminding pt to send remote transmission.   

## 2014-03-16 NOTE — Telephone Encounter (Signed)
ICM transmission received. I left a message for the patient to call. 

## 2014-03-16 NOTE — Telephone Encounter (Signed)
I spoke with the patient. 

## 2014-03-16 NOTE — Progress Notes (Signed)
EPIC Encounter for ICM Monitoring  Patient Name: Erin Jarvis is a 48 y.o. female Date: 03/16/2014 Primary Care Physican: [name], MD Primary Cardiologist: Hochrein Electrophysiologist: Allred Dry Weight: 309 lbs       In the past month, have you:  1. Gained more than 2 pounds in a day or more than 5 pounds in a week? no  2. Had changes in your medications (with verification of current medications)? About 3 months ago the patient was diagnosed with diabetes. She was put on metformin. About 3 weeks ago she started to have low BP readings (80/50) and her spironolactone was stopped thinking this may be causing drops in her BP with the metformin.  3. Had more shortness of breath than is usual for you? no  4. Limited your activity because of shortness of breath? no  5. Not been able to sleep because of shortness of breath? no  6. Had increased swelling in your feet or ankles? no  7. Had symptoms of dehydration (dizziness, dry mouth, increased thirst, decreased urine output) no  8. Had changes in sodium restriction? no  9. Been compliant with medication? Yes   ICM trend:   Follow-up plan: ICM clinic phone appointment: 03/27/14. The patient's optivol readings have been up from ~11/5- present. She is noticing no real change in her symptoms. She does admit that she has been having some GI issues so she has not been eating very much. She did eat some canned soup yesterday to help with her stomach. The rise in her optivol readings are most likely due to the discontinuation of her spironolactone. She is only taking her lasix PRN, not QOD as listed in her chart. I have advised her to take lasix 20 mg one tablet daily x 4 days, increase her dietary potassium during this time, and send a repeat transmission on 03/27/14. She is agreeable and will carefully monitor her BP during this time.  Copy of note sent to patient's primary care physician, primary cardiologist, and device following  physician.  Alvis Lemmings, RN, BSN 03/16/2014 5:44 PM

## 2014-03-27 ENCOUNTER — Ambulatory Visit (INDEPENDENT_AMBULATORY_CARE_PROVIDER_SITE_OTHER): Payer: Medicare Other | Admitting: *Deleted

## 2014-03-27 DIAGNOSIS — I5022 Chronic systolic (congestive) heart failure: Secondary | ICD-10-CM

## 2014-03-27 DIAGNOSIS — Z95810 Presence of automatic (implantable) cardiac defibrillator: Secondary | ICD-10-CM

## 2014-03-28 ENCOUNTER — Telehealth: Payer: Self-pay | Admitting: *Deleted

## 2014-03-28 NOTE — Telephone Encounter (Signed)
ICM transmission received. I have left the patient as message to call.

## 2014-03-29 NOTE — Progress Notes (Signed)
EPIC Encounter for ICM Monitoring  Patient Name: Erin Jarvis is a 48 y.o. female Date: 03/29/2014 Primary Care Physican: [name], MD Primary Cardiologist: Hochrein Electrophysiologist: Allred Dry Weight: 309 lbs       In the past month, have you:  1. Gained more than 2 pounds in a day or more than 5 pounds in a week? Uncertain. She has not been weighing at home. She has a scale that is "magnetic" based per her report. She had not asked if she could use this. Addressed with device clinic- recommended that she not use that scale. She states that she will obtain a traditional scale and start checking her weights.   2. Had changes in your medications (with verification of current medications)? Yes. The patient is off spironolactone due to low BP readings since starting metformin about 3-4 weeks ago. ICM transmission checked on 03/16/14 and her optivol readings were elevated. She was instructed to take lasix 20 mg daily x 4 days (typically this is PRN).   3. Had more shortness of breath than is usual for you? No. Her only complaint is fatigue.  4. Limited your activity because of shortness of breath? no  5. Not been able to sleep because of shortness of breath? no  6. Had increased swelling in your feet or ankles? no  7. Had symptoms of dehydration (dizziness, dry mouth, increased thirst, decreased urine output) no  8. Had changes in sodium restriction? no  9. Been compliant with medication? Yes   ICM trend:   Follow-up plan: ICM clinic phone appointment: pending review with MD. The patient's optivol trends continue to be elevated. She is not exhibiting any symptoms at this time except fatigue. She has not checked her BP in over a week with the Christmas holidays. She has not weighed. 03/09/14- showed her BUN/ Creatinine to be 11/0.8. I have advised her that she will most likely need to be back on a maintenance diuretic, however, that will also depend on how her BP's are  doing. She will start checking her BP's and recording these. I have advised her to take lasix 20 mg daily x 2 days, then take 20 mg once QOD after that. If she is not feeling well (dizzy/lightheaded), but her SBP is > 100, she will take lasix 20 mg 1/2 tablet. She will obtain a scale. I explained I would call her back on Monday 04/03/14 to follow up on her BP readings and then review with the MD to see how her medications need to be adjusted. She verbalizes understanding.   Copy of note sent to patient's primary care physician, primary cardiologist, and device following physician.  Alvis Lemmings, RN, BSN 03/29/2014 9:31 AM

## 2014-03-29 NOTE — Telephone Encounter (Signed)
I spoke with the patient. 

## 2014-04-03 ENCOUNTER — Telehealth: Payer: Self-pay | Admitting: *Deleted

## 2014-04-03 NOTE — Telephone Encounter (Signed)
ICM follow up call. I left a message for the patient to call and follow up on her BP's and to see if she obtained a scale.

## 2014-04-03 NOTE — Telephone Encounter (Signed)
I left a message for the patient to call. 

## 2014-04-06 DIAGNOSIS — D259 Leiomyoma of uterus, unspecified: Secondary | ICD-10-CM | POA: Diagnosis not present

## 2014-04-06 DIAGNOSIS — N8329 Other ovarian cysts: Secondary | ICD-10-CM | POA: Diagnosis not present

## 2014-04-06 DIAGNOSIS — R938 Abnormal findings on diagnostic imaging of other specified body structures: Secondary | ICD-10-CM | POA: Diagnosis not present

## 2014-04-07 NOTE — Telephone Encounter (Signed)
I spoke with the patient today. She states that her last BP check was 118/78. She has not been weighing. She is having a "full" feeling in her belly, but she also has a large fibroid that is pushing up. She is unsure if this or excess fluid is causing her fullness. She has been temporarily off lasix and metformin since she has a CT with contrast yesterday. I have advised her to start back on lasix 20 mg QOD starting on Sunday. I have reviewed this with Dr. Rayann Heman. He feels that the patient should follow up with Dr. Percival Spanish to assess her medications and fluid. Per Pam, the patient can be seen on 04/12/14 at 1:15 PM. The patient is aware and agreeable with the appointment. Follow up in [facility] clinic is pending her appointment with Dr. Percival Spanish.

## 2014-04-07 NOTE — Telephone Encounter (Signed)
I left a message for the patient to call. 

## 2014-04-12 ENCOUNTER — Encounter: Payer: Self-pay | Admitting: Cardiology

## 2014-04-12 ENCOUNTER — Ambulatory Visit (INDEPENDENT_AMBULATORY_CARE_PROVIDER_SITE_OTHER): Payer: Medicare Other | Admitting: Cardiology

## 2014-04-12 ENCOUNTER — Telehealth: Payer: Self-pay | Admitting: Emergency Medicine

## 2014-04-12 VITALS — BP 130/90 | HR 90 | Ht 67.0 in | Wt 301.0 lb

## 2014-04-12 DIAGNOSIS — I10 Essential (primary) hypertension: Secondary | ICD-10-CM | POA: Diagnosis not present

## 2014-04-12 DIAGNOSIS — O903 Peripartum cardiomyopathy: Secondary | ICD-10-CM | POA: Diagnosis not present

## 2014-04-12 MED ORDER — LISINOPRIL 20 MG PO TABS: 20.0000 mg | ORAL_TABLET | Freq: Two times a day (BID) | ORAL | Status: DC

## 2014-04-12 MED ORDER — CARVEDILOL 25 MG PO TABS: 25.0000 mg | ORAL_TABLET | Freq: Two times a day (BID) | ORAL | Status: DC

## 2014-04-12 MED ORDER — SPIRONOLACTONE 25 MG PO TABS: ORAL_TABLET | ORAL | Status: DC

## 2014-04-12 NOTE — Patient Instructions (Signed)
Please start Spironolactone 25 mg a day. Continue all other medications as listed.  Please have blood work in 1 week at [facility]. [facility])  Follow up in 2 months with Dr Percival Spanish in [place]

## 2014-04-12 NOTE — Telephone Encounter (Signed)
Lm for pt to make her aware that Dr Annamaria Boots reviewed her CT and she is a candidate for [place].  If ready to schedule to call back and we will ck on insurance and have Tiffany at [facility] to call her and set up the procedure.

## 2014-04-12 NOTE — Progress Notes (Signed)
HPI The patient has a history of a postpartum cardiomyopathy in [place]. Her cardiomyopathy was diagnosed in 1996 and she says her EF was about 20%. After treatment it went up to about 45%. While living in [facility] and had sustained ventricular tachycardia requiring placement of an ICD.  Earlier this year I sent her for a MUGA which demonstrated an EF of about 35%.    She was referred back for evaluation of volume. She was noted to have an elevated Optivol index.  This happened after she stopped spironolactone secondary to low blood pressure. She brings a low blood pressure on starting metformin. She's recently been diagnosed with diabetes. She's also been diagnosed with a large fibroid tumor and is apparently going to have radiologic ablation of this. This tumor has caused some discomfort and a feeling of fullness and inability to take a deep breath. She's not however describing PND or orthopnea. She's not had any palpitations, presyncope or syncope. She does cough when she tries to walk but she's not describing chest pressure, neck or arm discomfort. She does think her weight has been down she's not been weighing herself at home.   Allergies  Allergen Reactions  . Morphine And Related Nausea Only and Other (See Comments)    Feels sick,and like she is going to pass out; can't breathe, starts sweating    Current Outpatient Prescriptions  Medication Sig Dispense Refill  . carvedilol (COREG) 25 MG tablet Take 25 mg by mouth 2 (two) times daily with a meal.    . Fish Oil-Cholecalciferol (FISH OIL + D3 PO) Take 1 capsule by mouth daily.    . furosemide (LASIX) 20 MG tablet Take 20 mg by mouth every other day.     . lisinopril (PRINIVIL,ZESTRIL) 20 MG tablet TAKE ONE TABLET TWICE DAILY 60 tablet 6  . metFORMIN (GLUCOPHAGE) 1000 MG tablet Take 1,000 mg by mouth 2 (two) times daily with a meal.    . MILK THISTLE PO Take 1 capsule by mouth daily.    . Multiple Vitamin  (MULTIVITAMIN) tablet Take 1 tablet by mouth daily.     No current facility-administered medications for this visit.    Past Medical History  Diagnosis Date  . Postpartum cardiomyopathy     Last echocardiogram was last year and EF 45 -50 %  . Hypertensive cardiovascular disease   . Seasonal allergies   . Palpitations   . Ventricular tachycardia     s/p ICD Implantation (MDT Secura VR)  . Sleep apnea     complaint with CPAP  . Obesity   . Diabetes mellitus without complication   . Hypertension   . AICD (automatic cardioverter/defibrillator) present 2002, 2010    secondary to post-partum cardiomyopathy (1996)  . Presence of permanent cardiac pacemaker   . CHF (congestive heart failure)   . Shortness of breath dyspnea     Past Surgical History  Procedure Laterality Date  . Cardiac defibrillator placement  08/12/2000, 11/11/2008    Most recent Generator MDT Secura VR placed in [place]  . Cesarean section  1996  . Cholecystectomy  2004    ROS:  As stated in the HPI and negative for all other systems.   PHYSICAL EXAM BP 130/90 mmHg  Pulse 90  Ht 5\' 7"  (1.702 m)  Wt 301 lb (136.533 kg)  BMI 47.13 kg/m2 GENERAL:  Well appearing HEENT:  Pupils equal round and reactive, fundi not visualized, oral mucosa unremarkable NECK:  No jugular venous distention,  waveform within normal limits, carotid upstroke brisk and symmetric, no bruits, no thyromegaly LUNGS:  Clear to auscultation bilaterally CHEST:  Well healed ICD pocket HEART:  PMI not displaced or sustained,S1 and S2 within normal limits, no S3, no S4, no clicks, no rubs, no murmurs ABD:  Flat, positive bowel sounds normal in frequency in pitch, no bruits, no rebound, no guarding, no midline pulsatile mass, no hepatomegaly, no splenomegaly, obese EXT:  2 plus pulses throughout, no edema, no cyanosis no clubbing, obese EXT:  No edema  EKG:   Sinus rhythm, rate 83, low-voltage chest in limb leads, poor anterior R wave  progression, QTC slightly prolonged, no acute ST-T wave changes.   ASSESSMENT AND PLAN  Peripartum cardiomyopathy - I will start her back on her spironolactone. She will get a basic metabolic profile in about one week. She will let me know if she has any further hypotension with this.  Hypertension - She reports a low blood pressure as above it seems to be normal. No change in therapy is indicated.  Obesity - The patient understands the need to lose weight with diet and exercise. We have discussed specific strategies for this.  ICD - She will continue follow up in ICD clinic.

## 2014-04-13 ENCOUNTER — Encounter: Payer: Self-pay | Admitting: *Deleted

## 2014-04-13 NOTE — Telephone Encounter (Signed)
Reviewed the note for the patient's office visit with Dr. Percival Spanish. Will schedule next ICM check for 05/01/14. Letter mailed to the patient.

## 2014-04-17 ENCOUNTER — Telehealth: Payer: Self-pay | Admitting: Emergency Medicine

## 2014-04-17 NOTE — Telephone Encounter (Signed)
Returned pt call from Friday, she is a candidate for [place] and will have Tiffany at [facility] to contact her to set up procedure. No ins. autho needed with Medicare.

## 2014-04-20 ENCOUNTER — Other Ambulatory Visit: Payer: Self-pay | Admitting: Cardiology

## 2014-04-20 ENCOUNTER — Telehealth: Payer: Self-pay | Admitting: Cardiology

## 2014-04-20 DIAGNOSIS — I10 Essential (primary) hypertension: Secondary | ICD-10-CM | POA: Diagnosis not present

## 2014-04-20 DIAGNOSIS — O903 Peripartum cardiomyopathy: Secondary | ICD-10-CM | POA: Diagnosis not present

## 2014-04-20 LAB — BASIC METABOLIC PANEL
BUN: 18 mg/dL (ref 6–23)
CALCIUM: 8.4 mg/dL (ref 8.4–10.5)
CHLORIDE: 102 meq/L (ref 96–112)
CO2: 22 mEq/L (ref 19–32)
CREATININE: 1.04 mg/dL (ref 0.50–1.10)
Glucose, Bld: 98 mg/dL (ref 70–99)
Potassium: 4.4 mEq/L (ref 3.5–5.3)
SODIUM: 137 meq/L (ref 135–145)

## 2014-04-20 NOTE — Telephone Encounter (Signed)
New msg       Pt is [facility] in [place], attempting to get labs completed for Dr. Percival Spanish. Pt sees him in [place].   Pt states they are attempting to turn her away, stating there is no order for labs.   Please return pt call.

## 2014-04-20 NOTE — Telephone Encounter (Signed)
Spoke with [facility] in [place].  Will fax order to [phone] for BMP.

## 2014-04-24 ENCOUNTER — Other Ambulatory Visit: Payer: Self-pay | Admitting: Interventional Radiology

## 2014-04-24 DIAGNOSIS — D251 Intramural leiomyoma of uterus: Secondary | ICD-10-CM

## 2014-05-01 ENCOUNTER — Ambulatory Visit (INDEPENDENT_AMBULATORY_CARE_PROVIDER_SITE_OTHER): Payer: Medicare Other | Admitting: *Deleted

## 2014-05-01 ENCOUNTER — Encounter: Payer: Self-pay | Admitting: Internal Medicine

## 2014-05-01 ENCOUNTER — Encounter: Payer: Self-pay | Admitting: *Deleted

## 2014-05-01 DIAGNOSIS — O903 Peripartum cardiomyopathy: Secondary | ICD-10-CM | POA: Diagnosis not present

## 2014-05-01 DIAGNOSIS — Z95810 Presence of automatic (implantable) cardiac defibrillator: Secondary | ICD-10-CM | POA: Diagnosis not present

## 2014-05-01 NOTE — Progress Notes (Signed)
EPIC Encounter for ICM Monitoring  Patient Name: Erin Jarvis is a 49 y.o. female Date: 05/01/2014 Primary Care Physican: [name], MD Primary Cardiologist: Hochrein Electrophysiologist: Allred Dry Weight: 301 lbs       In the past month, have you:  1. Gained more than 2 pounds in a day or more than 5 pounds in a week? Uncertain- she still has not obtained a scale for home due to the weather and not feeling well. She states she will obtain this in the near future.   2. Had changes in your medications (with verification of current medications)? Yes. The patient saw Dr. Percival Spanish on 04/12/14 and she was restarted on spironolactone 25 mg once daily.  She states she was told to stop lasix at that time.   3. Had more shortness of breath than is usual for you? no  4. Limited your activity because of shortness of breath? no  5. Not been able to sleep because of shortness of breath? no  6. Had increased swelling in your feet or ankles? no  7. Had symptoms of dehydration (dizziness, dry mouth, increased thirst, decreased urine output) no  8. Had changes in sodium restriction? Yes. The patient has been having trouble eating due to not feeling well and has been eating some canned soup. She knows she needs to avoid this.   9. Been compliant with medication? Yes   ICM trend:   Follow-up plan: ICM clinic phone appointment: 06/01/14. The patient's optivol readings have been elevated from ~ 11/2- present. She saw Dr. Percival Spanish for an office visit on 04/12/14 to follow up on her trends and was restarted on spironolactone. She had mentioned to me last month that she has a fibroid tumor that she thought may be causing her to feel bloated. She mentioned this to Dr. Percival Spanish as well. Today she reports feeling well over all. She thinks her lower extremity swelling has been under control. She does states today that her fibroid is about the size of a cantaloupe. She is going on 05/15/14 to have this  ablated. She is going through menopause and was told by her GYN that her horomone changes may be feeding the fibroid and adding to its size. She only feels SOB when she bends over and then tries to go up an incline. She feels constipated as well, which may all be coming from the size / location of the fibroid. I advised her that I would make no changes today, but would forward to Drs. Hochrein/ Allred for review and recommendations. I will call her back with any changes, otherwise , I will follow up with her in 1 month which will be about 2 weeks post ablation.   Copy of note sent to patient's primary care physician, primary cardiologist, and device following physician.  Alvis Lemmings, RN, BSN 05/01/2014 11:37 AM

## 2014-05-01 NOTE — Progress Notes (Signed)
Remote ICD transmission.   

## 2014-05-02 LAB — MDC_IDC_ENUM_SESS_TYPE_REMOTE
Battery Voltage: 3.05 V
Brady Statistic RV Percent Paced: 0.01 %
HIGH POWER IMPEDANCE MEASURED VALUE: 39 Ohm
HighPow Impedance: 49 Ohm
Lead Channel Impedance Value: 342 Ohm
Lead Channel Pacing Threshold Amplitude: 1.125 V
Lead Channel Pacing Threshold Pulse Width: 0.4 ms
Lead Channel Setting Pacing Amplitude: 2.5 V
Lead Channel Setting Pacing Pulse Width: 0.4 ms
Lead Channel Setting Sensing Sensitivity: 0.3 mV
MDC IDC MSMT LEADCHNL RV SENSING INTR AMPL: 4.875 mV
MDC IDC SESS DTM: 20160201155627
MDC IDC SET ZONE DETECTION INTERVAL: 360 ms
Zone Setting Detection Interval: 240 ms
Zone Setting Detection Interval: 300 ms
Zone Setting Detection Interval: 330 ms

## 2014-05-12 ENCOUNTER — Other Ambulatory Visit: Payer: Self-pay | Admitting: Radiology

## 2014-05-15 ENCOUNTER — Observation Stay (HOSPITAL_COMMUNITY)
Admission: RE | Admit: 2014-05-15 | Discharge: 2014-05-16 | Disposition: A | Discharge status: Home / Self Care | Payer: Medicare Other | Source: Ambulatory Visit | Attending: Interventional Radiology | Admitting: Interventional Radiology

## 2014-05-15 ENCOUNTER — Encounter (HOSPITAL_COMMUNITY): Payer: Self-pay

## 2014-05-15 ENCOUNTER — Ambulatory Visit (HOSPITAL_COMMUNITY)
Admission: RE | Admit: 2014-05-15 | Discharge: 2014-05-15 | Disposition: A | Discharge status: Home / Self Care | Payer: Medicare Other | Source: Ambulatory Visit | Attending: Interventional Radiology | Admitting: Interventional Radiology

## 2014-05-15 VITALS — BP 115/71 | HR 87 | Temp 97.8°F | Resp 18

## 2014-05-15 DIAGNOSIS — Z95810 Presence of automatic (implantable) cardiac defibrillator: Secondary | ICD-10-CM | POA: Diagnosis not present

## 2014-05-15 DIAGNOSIS — Z885 Allergy status to narcotic agent status: Secondary | ICD-10-CM | POA: Diagnosis not present

## 2014-05-15 DIAGNOSIS — E119 Type 2 diabetes mellitus without complications: Secondary | ICD-10-CM | POA: Diagnosis not present

## 2014-05-15 DIAGNOSIS — G4733 Obstructive sleep apnea (adult) (pediatric): Secondary | ICD-10-CM | POA: Diagnosis not present

## 2014-05-15 DIAGNOSIS — I110 Hypertensive heart disease with heart failure: Secondary | ICD-10-CM | POA: Diagnosis not present

## 2014-05-15 DIAGNOSIS — D251 Intramural leiomyoma of uterus: Principal | ICD-10-CM | POA: Insufficient documentation

## 2014-05-15 DIAGNOSIS — E669 Obesity, unspecified: Secondary | ICD-10-CM | POA: Diagnosis not present

## 2014-05-15 DIAGNOSIS — Z79899 Other long term (current) drug therapy: Secondary | ICD-10-CM | POA: Insufficient documentation

## 2014-05-15 DIAGNOSIS — N926 Irregular menstruation, unspecified: Secondary | ICD-10-CM | POA: Diagnosis not present

## 2014-05-15 DIAGNOSIS — D259 Leiomyoma of uterus, unspecified: Secondary | ICD-10-CM | POA: Diagnosis not present

## 2014-05-15 DIAGNOSIS — D219 Benign neoplasm of connective and other soft tissue, unspecified: Secondary | ICD-10-CM | POA: Diagnosis present

## 2014-05-15 LAB — URINALYSIS, ROUTINE W REFLEX MICROSCOPIC
Bilirubin Urine: NEGATIVE
GLUCOSE, UA: NEGATIVE mg/dL
Hgb urine dipstick: NEGATIVE
KETONES UR: NEGATIVE mg/dL
NITRITE: NEGATIVE
Protein, ur: >300 mg/dL — AB
SPECIFIC GRAVITY, URINE: 1.02 (ref 1.005–1.030)
UROBILINOGEN UA: 0.2 mg/dL (ref 0.0–1.0)
pH: 8 (ref 5.0–8.0)

## 2014-05-15 LAB — GLUCOSE, CAPILLARY
GLUCOSE-CAPILLARY: 103 mg/dL — AB (ref 70–99)
GLUCOSE-CAPILLARY: 111 mg/dL — AB (ref 70–99)
Glucose-Capillary: 108 mg/dL — ABNORMAL HIGH (ref 70–99)
Glucose-Capillary: 108 mg/dL — ABNORMAL HIGH (ref 70–99)

## 2014-05-15 LAB — BASIC METABOLIC PANEL
Anion gap: 11 (ref 5–15)
BUN: 15 mg/dL (ref 6–23)
CHLORIDE: 99 mmol/L (ref 96–112)
CO2: 24 mmol/L (ref 19–32)
CREATININE: 1 mg/dL (ref 0.50–1.10)
Calcium: 8.6 mg/dL (ref 8.4–10.5)
GFR calc Af Amer: 74 mL/min — ABNORMAL LOW (ref >90)
GFR calc non Af Amer: 64 mL/min — ABNORMAL LOW (ref >90)
Glucose, Bld: 126 mg/dL — ABNORMAL HIGH (ref 70–99)
Potassium: 4.1 mmol/L (ref 3.5–5.1)
Sodium: 134 mmol/L — ABNORMAL LOW (ref 135–145)

## 2014-05-15 LAB — CBC WITH DIFFERENTIAL/PLATELET
Basophils Absolute: 0.1 10*3/uL (ref 0.0–0.1)
Basophils Relative: 1 % (ref 0–1)
Eosinophils Absolute: 0.2 10*3/uL (ref 0.0–0.7)
Eosinophils Relative: 2 % (ref 0–5)
HCT: 33.2 % — ABNORMAL LOW (ref 36.0–46.0)
Hemoglobin: 9.5 g/dL — ABNORMAL LOW (ref 12.0–15.0)
LYMPHS ABS: 1.8 10*3/uL (ref 0.7–4.0)
LYMPHS PCT: 19 % (ref 12–46)
MCH: 22.1 pg — AB (ref 26.0–34.0)
MCHC: 28.6 g/dL — ABNORMAL LOW (ref 30.0–36.0)
MCV: 77.4 fL — ABNORMAL LOW (ref 78.0–100.0)
MONOS PCT: 7 % (ref 3–12)
Monocytes Absolute: 0.7 10*3/uL (ref 0.1–1.0)
Neutro Abs: 6.8 10*3/uL (ref 1.7–7.7)
Neutrophils Relative %: 71 % (ref 43–77)
PLATELETS: 253 10*3/uL (ref 150–400)
RBC: 4.29 MIL/uL (ref 3.87–5.11)
RDW: 15.2 % (ref 11.5–15.5)
WBC: 9.5 10*3/uL (ref 4.0–10.5)

## 2014-05-15 LAB — PROTIME-INR
INR: 1.2 (ref 0.00–1.49)
PROTHROMBIN TIME: 15.3 s — AB (ref 11.6–15.2)

## 2014-05-15 LAB — HCG, SERUM, QUALITATIVE: Preg, Serum: NEGATIVE

## 2014-05-15 LAB — URINE MICROSCOPIC-ADD ON

## 2014-05-15 LAB — APTT: aPTT: 25 seconds (ref 24–37)

## 2014-05-15 MED ORDER — LIDOCAINE HCL 1 % IJ SOLN
INTRAMUSCULAR | Status: AC
Start: 2014-05-15 — End: 2014-05-15
  Filled 2014-05-15: qty 20

## 2014-05-15 MED ORDER — HYDROMORPHONE 0.3 MG/ML IV SOLN
INTRAVENOUS | Status: DC
  Administered 2014-05-15 (×2): 1.2 mg via INTRAVENOUS
  Administered 2014-05-16: 0.3 mg via INTRAVENOUS
  Administered 2014-05-16: 0.6 mg via INTRAVENOUS
  Administered 2014-05-16: 0.9 mg via INTRAVENOUS

## 2014-05-15 MED ORDER — PROMETHAZINE HCL 25 MG RE SUPP: 25.0000 mg | Freq: Three times a day (TID) | RECTAL | Status: DC | PRN

## 2014-05-15 MED ORDER — DIPHENHYDRAMINE HCL 12.5 MG/5ML PO ELIX
12.5000 mg | ORAL_SOLUTION | Freq: Four times a day (QID) | ORAL | Status: DC | PRN
  Filled 2014-05-15: qty 5

## 2014-05-15 MED ORDER — MIDAZOLAM HCL 2 MG/2ML IJ SOLN
INTRAMUSCULAR | Status: AC
Start: 2014-05-15 — End: 2014-05-15
  Filled 2014-05-15: qty 6

## 2014-05-15 MED ORDER — SODIUM CHLORIDE 0.9 % IJ SOLN: 3.0000 mL | INTRAMUSCULAR | Status: DC | PRN

## 2014-05-15 MED ORDER — ONDANSETRON HCL 4 MG/2ML IJ SOLN
INTRAMUSCULAR | Status: AC
  Filled 2014-05-15: qty 2

## 2014-05-15 MED ORDER — NALOXONE HCL 0.4 MG/ML IJ SOLN: 0.4000 mg | INTRAMUSCULAR | Status: DC | PRN

## 2014-05-15 MED ORDER — PROMETHAZINE HCL 25 MG PO TABS: 25.0000 mg | ORAL_TABLET | Freq: Three times a day (TID) | ORAL | Status: DC | PRN

## 2014-05-15 MED ORDER — CARVEDILOL 25 MG PO TABS
25.0000 mg | ORAL_TABLET | Freq: Two times a day (BID) | ORAL | Status: DC
  Administered 2014-05-15 – 2014-05-16 (×2): 25 mg via ORAL
  Filled 2014-05-15 (×4): qty 1

## 2014-05-15 MED ORDER — SODIUM CHLORIDE 0.9 % IJ SOLN: 9.0000 mL | INTRAMUSCULAR | Status: DC | PRN

## 2014-05-15 MED ORDER — METFORMIN HCL 500 MG PO TABS: 1000.0000 mg | ORAL_TABLET | Freq: Two times a day (BID) | ORAL | Status: DC

## 2014-05-15 MED ORDER — ONDANSETRON HCL 4 MG/2ML IJ SOLN
INTRAMUSCULAR | Status: AC | PRN
  Administered 2014-05-15: 4 mg via INTRAVENOUS

## 2014-05-15 MED ORDER — HYDROMORPHONE 0.3 MG/ML IV SOLN
INTRAVENOUS | Status: DC
  Administered 2014-05-15: 12:00:00 via INTRAVENOUS

## 2014-05-15 MED ORDER — SODIUM CHLORIDE 0.9 % IJ SOLN
3.0000 mL | Freq: Two times a day (BID) | INTRAMUSCULAR | Status: DC
Start: 2014-05-15 — End: 2014-05-16
  Administered 2014-05-16: 3 mL via INTRAVENOUS

## 2014-05-15 MED ORDER — DEXTROSE 5 % IV SOLN
3.0000 g | INTRAVENOUS | Status: AC
  Administered 2014-05-15: 3 g via INTRAVENOUS
  Filled 2014-05-15: qty 3000

## 2014-05-15 MED ORDER — ONDANSETRON HCL 4 MG/2ML IJ SOLN: 4.0000 mg | Freq: Four times a day (QID) | INTRAMUSCULAR | Status: DC | PRN

## 2014-05-15 MED ORDER — SODIUM CHLORIDE 0.9 % IV SOLN
INTRAVENOUS | Status: DC
  Administered 2014-05-15 (×2): via INTRAVENOUS

## 2014-05-15 MED ORDER — KETOROLAC TROMETHAMINE 30 MG/ML IJ SOLN
30.0000 mg | INTRAMUSCULAR | Status: AC
Start: 2014-05-15 — End: 2014-05-15
  Administered 2014-05-15: 30 mg via INTRAVENOUS

## 2014-05-15 MED ORDER — DIPHENHYDRAMINE HCL 50 MG/ML IJ SOLN: 12.5000 mg | Freq: Four times a day (QID) | INTRAMUSCULAR | Status: DC | PRN

## 2014-05-15 MED ORDER — LISINOPRIL 20 MG PO TABS: 20.0000 mg | ORAL_TABLET | Freq: Two times a day (BID) | ORAL | Status: DC

## 2014-05-15 MED ORDER — FENTANYL CITRATE 0.05 MG/ML IJ SOLN
INTRAMUSCULAR | Status: AC | PRN
  Administered 2014-05-15 (×2): 25 ug via INTRAVENOUS
  Administered 2014-05-15: 50 ug via INTRAVENOUS

## 2014-05-15 MED ORDER — LISINOPRIL 20 MG PO TABS
20.0000 mg | ORAL_TABLET | Freq: Two times a day (BID) | ORAL | Status: DC
  Administered 2014-05-15 – 2014-05-16 (×2): 20 mg via ORAL
  Filled 2014-05-15 (×3): qty 1

## 2014-05-15 MED ORDER — SODIUM CHLORIDE 0.9 % IV SOLN
250.0000 mL | INTRAVENOUS | Status: DC | PRN
  Administered 2014-05-16: 1000 mL via INTRAVENOUS

## 2014-05-15 MED ORDER — SODIUM CHLORIDE 0.9 % IV SOLN
250.0000 mL | INTRAVENOUS | Status: DC | PRN
Start: 2014-05-15 — End: 2014-05-16

## 2014-05-15 MED ORDER — IOHEXOL 300 MG/ML  SOLN
180.0000 mL | Freq: Once | INTRAMUSCULAR | Status: AC | PRN
  Administered 2014-05-15: 180 mL

## 2014-05-15 MED ORDER — KETOROLAC TROMETHAMINE 30 MG/ML IJ SOLN
INTRAMUSCULAR | Status: AC
  Administered 2014-05-15: 30 mg via INTRAVENOUS
  Filled 2014-05-15: qty 1

## 2014-05-15 MED ORDER — HYDROMORPHONE HCL 2 MG/ML IJ SOLN
INTRAMUSCULAR | Status: AC
  Filled 2014-05-15: qty 2

## 2014-05-15 MED ORDER — HYDROMORPHONE 0.3 MG/ML IV SOLN
INTRAVENOUS | Status: AC
  Filled 2014-05-15: qty 25

## 2014-05-15 MED ORDER — SPIRONOLACTONE 25 MG PO TABS: 25.0000 mg | ORAL_TABLET | Freq: Once | ORAL | Status: DC

## 2014-05-15 MED ORDER — MIDAZOLAM HCL 2 MG/2ML IJ SOLN
INTRAMUSCULAR | Status: AC | PRN
  Administered 2014-05-15: 0.5 mg via INTRAVENOUS
  Administered 2014-05-15: 1 mg via INTRAVENOUS
  Administered 2014-05-15: 0.5 mg via INTRAVENOUS

## 2014-05-15 MED ORDER — CARVEDILOL 25 MG PO TABS: 25.0000 mg | ORAL_TABLET | Freq: Two times a day (BID) | ORAL | Status: DC

## 2014-05-15 MED ORDER — SPIRONOLACTONE 25 MG PO TABS
25.0000 mg | ORAL_TABLET | Freq: Once | ORAL | Status: AC
  Administered 2014-05-15: 25 mg via ORAL
  Filled 2014-05-15: qty 1

## 2014-05-15 MED ORDER — DIPHENHYDRAMINE HCL 12.5 MG/5ML PO ELIX
12.5000 mg | ORAL_SOLUTION | Freq: Four times a day (QID) | ORAL | Status: DC | PRN
Start: 2014-05-15 — End: 2014-05-16

## 2014-05-15 MED ORDER — FENTANYL CITRATE 0.05 MG/ML IJ SOLN
INTRAMUSCULAR | Status: AC
  Filled 2014-05-15: qty 4

## 2014-05-15 MED ORDER — ONDANSETRON HCL 4 MG/2ML IJ SOLN
4.0000 mg | Freq: Four times a day (QID) | INTRAMUSCULAR | Status: DC | PRN
Start: 2014-05-15 — End: 2014-05-16

## 2014-05-15 MED ORDER — DOCUSATE SODIUM 100 MG PO CAPS: 100.0000 mg | ORAL_CAPSULE | Freq: Two times a day (BID) | ORAL | Status: DC

## 2014-05-15 MED ORDER — DOCUSATE SODIUM 100 MG PO CAPS
100.0000 mg | ORAL_CAPSULE | Freq: Two times a day (BID) | ORAL | Status: DC
  Administered 2014-05-15 – 2014-05-16 (×2): 100 mg via ORAL
  Filled 2014-05-15 (×3): qty 1

## 2014-05-15 MED ORDER — SODIUM CHLORIDE 0.9 % IJ SOLN: 3.0000 mL | Freq: Two times a day (BID) | INTRAMUSCULAR | Status: DC

## 2014-05-15 NOTE — Sedation Documentation (Signed)
Increased O2 via [place] to 4lpm to optimize oxygenation.

## 2014-05-15 NOTE — H&P (Deleted)
  The note originally documented on this encounter has been moved the the encounter in which it belongs.  

## 2014-05-15 NOTE — Sedation Documentation (Signed)
5 Fr sheath removed from R femoral artery by Dr. Annamaria Boots.  Groin level 0, 1+RDP.

## 2014-05-15 NOTE — Discharge Instructions (Signed)
Uterine Artery Embolization for Fibroids, Care After Refer to this sheet in the next few weeks. These instructions provide you with information on caring for yourself after your procedure. Your health care provider may also give you more specific instructions. Your treatment has been planned according to current medical practices, but problems sometimes occur. Call your health care provider if you have any problems or questions after your procedure. WHAT TO EXPECT AFTER THE PROCEDURE After your procedure, it is typical to have cramping in the pelvis. You will be given pain medicine to control it. HOME CARE INSTRUCTIONS  Only take over-the-counter or prescription medicines for pain, discomfort, or fever as directed by your health care provider.  Do not take aspirin. It can cause bleeding.  Follow your health care provider's advice regarding medicines given to you, diet, activity, and when to begin sexual activity.  See your health care provider for follow-up care as directed. SEEK MEDICAL CARE IF:  You have a fever.  You have redness, swelling, and pain around your incision site.  You have pus draining from your incision.  You have a rash. SEEK IMMEDIATE MEDICAL CARE IF:  You have bleeding from your incision site.  You have difficulty breathing.  You have chest pain.  You have severe abdominal pain.  You have leg pain.  You become dizzy and faint. Document Released: 01/05/2013 Document Reviewed: 01/05/2013 [facility] Patient Information 2015 [place], [place]. This information is not intended to replace advice given to you by your health care provider. Make sure you discuss any questions you have with your health care provider. Conscious Sedation Sedation is the use of medicines to promote relaxation and relieve discomfort and anxiety. Conscious sedation is a type of sedation. Under conscious sedation you are less alert than normal but are still able to respond to instructions or  stimulation. Conscious sedation is used during short medical and dental procedures. It is milder than deep sedation or general anesthesia and allows you to return to your regular activities sooner.  LET [facility] CARE PROVIDER KNOW ABOUT:   Any allergies you have.  All medicines you are taking, including vitamins, herbs, eye drops, creams, and over-the-counter medicines.  Use of steroids (by mouth or creams).  Previous problems you or members of your family have had with the use of anesthetics.  Any blood disorders you have.  Previous surgeries you have had.  Medical conditions you have.  Possibility of pregnancy, if this applies.  Use of cigarettes, alcohol, or illegal drugs. RISKS AND COMPLICATIONS Generally, this is a safe procedure. However, as with any procedure, problems can occur. Possible problems include:  Oversedation.  Trouble breathing on your own. You may need to have a breathing tube until you are awake and breathing on your own.  Allergic reaction to any of the medicines used for the procedure. BEFORE THE PROCEDURE  You may have blood tests done. These tests can help show how well your kidneys and liver are working. They can also show how well your blood clots.  A physical exam will be done.  Only take medicines as directed by your health care provider. You may need to stop taking medicines (such as blood thinners, aspirin, or nonsteroidal anti-inflammatory drugs) before the procedure.   Do not eat or drink at least 6 hours before the procedure or as directed by your health care provider.  Arrange for a responsible adult, family member, or friend to take you home after the procedure. He or she should stay with  you for at least 24 hours after the procedure, until the medicine has worn off. PROCEDURE   An intravenous (IV) catheter will be inserted into one of your veins. Medicine will be able to flow directly into your body through this catheter. You may be  given medicine through this tube to help prevent pain and help you relax.  The medical or dental procedure will be done. AFTER THE PROCEDURE  You will stay in a recovery area until the medicine has worn off. Your blood pressure and pulse will be checked.   Depending on the procedure you had, you may be allowed to go home when you can tolerate liquids and your pain is under control. Document Released: 12/10/2000 Document Revised: 03/22/2013 Document Reviewed: 11/22/2012 [facility] Patient Information 2015 [place], [place]. This information is not intended to replace advice given to you by your health care provider. Make sure you discuss any questions you have with your health care provider.

## 2014-05-15 NOTE — Sedation Documentation (Signed)
Gauze/tegaderm bandage applied to R fem artery puncture.  Groin level 0, 1+RDP, CDI.

## 2014-05-15 NOTE — Procedures (Signed)
Pt placed on hospital cpap machine.  Pt has med full face mask, setting of 10cmH2O, and 4L of oxygen.  Pt resting comfortably at this time.

## 2014-05-15 NOTE — H&P (Signed)
Chief Complaint: Symptomatic uterine fibroid  Referring Physician(s): Shick,Michael  History of Present Illness: Erin Jarvis is a 49 y.o. female with history of symptomatic uterine fibroid who presents today following recent IR consultation for elective bilateral uterine artery embolization. Of note, pt also has OSA and uses nightly CPAP as well as AICD secondary to hx of postpartum cardiomyopathy/VT.  Past Medical History  Diagnosis Date  . Postpartum cardiomyopathy     Last echocardiogram was last year and EF 45 -50 %  . Hypertensive cardiovascular disease   . Seasonal allergies   . Palpitations   . Ventricular tachycardia     s/p ICD Implantation (MDT Secura VR)  . Sleep apnea     complaint with CPAP  . Obesity   . Diabetes mellitus without complication   . Hypertension   . AICD (automatic cardioverter/defibrillator) present 2002, 2010    secondary to post-partum cardiomyopathy (1996)  . Presence of permanent cardiac pacemaker   . CHF (congestive heart failure)   . Shortness of breath dyspnea     Past Surgical History  Procedure Laterality Date  . Cardiac defibrillator placement  08/12/2000, 11/11/2008    Most recent Generator MDT Secura VR placed in [place]  . Cesarean section  1996  . Cholecystectomy  2004    Allergies: Morphine and related  Medications: Prior to Admission medications   Medication Sig Start Date End Date Taking? Authorizing Provider  acetaminophen (TYLENOL) 500 MG tablet Take 1,000 mg by mouth every 6 (six) hours as needed for mild pain or moderate pain.   Yes Historical Provider, MD  carvedilol (COREG) 25 MG tablet Take 1 tablet (25 mg total) by mouth 2 (two) times daily with a meal. 04/12/14  Yes Minus Breeding, MD  Fish Oil-Cholecalciferol (FISH OIL + D3 PO) Take 1 capsule by mouth daily.   Yes Historical Provider, MD  lisinopril (PRINIVIL,ZESTRIL) 20 MG tablet Take 1 tablet (20 mg total) by mouth 2 (two) times daily. 04/12/14  Yes  Minus Breeding, MD  metFORMIN (GLUCOPHAGE) 1000 MG tablet Take 1,000 mg by mouth 2 (two) times daily with a meal.   Yes Historical Provider, MD  MILK THISTLE PO Take 1 capsule by mouth daily.   Yes Historical Provider, MD  Multiple Vitamin (MULTIVITAMIN) tablet Take 1 tablet by mouth daily.   Yes Historical Provider, MD  spironolactone (ALDACTONE) 25 MG tablet TAKE ONE TABLET EACH DAY 04/12/14  Yes Minus Breeding, MD    Family History  Problem Relation Age of Onset  . Other Mother     has chronic pain syndrome from DDD with spinal cord stimulator    History   Social History  . Marital Status: Divorced    Spouse Name: N/A  . Number of Children: N/A  . Years of Education: N/A   Social History Main Topics  . Smoking status: Never Smoker   . Smokeless tobacco: Never Used  . Alcohol Use: No  . Drug Use: No  . Sexual Activity: Not on file   Other Topics Concern  . None   Social History Narrative   Lives with her parents and 68 year old daughter in [place].      Review of Systems   Constitutional: Negative for fever and chills.  Respiratory: Positive for shortness of breath.        Occ cough  Cardiovascular: Negative for chest pain.  Gastrointestinal: Positive for abdominal pain. Negative for blood in stool.  Occ nausea/vomiting but none in last 48-72 hours  Genitourinary: Positive for pelvic pain. Negative for dysuria and hematuria.       Pelvic "pressure"  Musculoskeletal: Positive for back pain.  Neurological: Negative for headaches.  Hematological: Does not bruise/bleed easily.  Psychiatric/Behavioral: The patient is nervous/anxious.     Vital Signs: BP 138/94 mmHg  Pulse 106  Temp(Src) 99 F (37.2 C) (Oral)  Resp 20  SpO2 98%  LMP 03/14/2014  Physical Exam  Constitutional: She is oriented to person, place, and time. She appears well-developed and well-nourished.  Cardiovascular: Regular rhythm.   Sl tachy; Left chest wall AICD  Pulmonary/Chest:  Effort normal and breath sounds normal.  Abdominal: Soft. Bowel sounds are normal. There is tenderness.  obese  Musculoskeletal: Normal range of motion. She exhibits edema.  Neurological: She is alert and oriented to person, place, and time.    Imaging: No results found.  Labs:  CBC:  Recent Labs  05/15/14 0810  WBC 9.5  HGB 9.5*  HCT 33.2*  PLT 253    COAGS:  Recent Labs  05/15/14 0810  INR 1.20  APTT 25    BMP:  Recent Labs  03/09/14 1254 04/20/14 1144  NA  --  137  K  --  4.4  CL  --  102  CO2  --  22  GLUCOSE  --  98  BUN 11 18  CALCIUM  --  8.4  CREATININE 0.89 1.04  GFRNONAA 76  --   GFRAA 87  --     LIVER FUNCTION TESTS: No results for input(s): BILITOT, AST, ALT, ALKPHOS, PROT, ALBUMIN in the last 8760 hours.  TUMOR MARKERS: No results for input(s): AFPTM, CEA, CA199, CHROMGRNA in the last 8760 hours.  Assessment and Plan: [name] is a 49 y.o. female with history of symptomatic uterine fibroid who presents today following recent IR consultation for elective bilateral uterine artery embolization. Of note, pt also has OSA and uses nightly CPAP as well as AICD secondary to hx of postpartum cardiomyopathy/VT. Details/risks of procedure d/w pt/mother with their understanding and consent. UA reveals protein >300, small leukocytes,neg nitrite - d/w Dr. Annamaria Boots and plan to proceed with case. Pt to receive IV Ancef preprocedure. She will be admitted for overnight observation following procedure.   Signed: Autumn Messing 05/15/2014, 9:04 AM

## 2014-05-15 NOTE — Progress Notes (Signed)
Subjective: Pt drowsy ; c/o mild pelvic discomfort; using dilaudid PCA  Objective: Vital signs in last 24 hours: Temp:  [97.5 F (36.4 C)-99 F (37.2 C)] 97.8 F (36.6 C) (02/15 1317) Pulse Rate:  [84-106] 87 (02/15 1317) Resp:  [11-23] 11 (02/15 1548) BP: (115-147)/(71-106) 115/71 mmHg (02/15 1317) SpO2:  [91 %-98 %] 94 % (02/15 1317) Weight:  [301 lb (136.533 kg)] 301 lb (136.533 kg) (02/15 1218)    Intake/Output from previous day:   Intake/Output this shift:  awake but drowsy; rt CFA puncture site clean and dry,NT, no hematoma, intact distal pulses; abd obese,mild-mod pelvic tenderness  Lab Results:   Recent Labs  05/15/14 0810  WBC 9.5  HGB 9.5*  HCT 33.2*  PLT 253   BMET  Recent Labs  05/15/14 0810  NA 134*  K 4.1  CL 99  CO2 24  GLUCOSE 126*  BUN 15  CREATININE 1.00  CALCIUM 8.6   PT/INR  Recent Labs  05/15/14 0810  LABPROT 15.3*  INR 1.20   ABG No results for input(s): PHART, HCO3 in the last 72 hours.  Invalid input(s): PCO2, PO2  Studies/Results: Ir Angiogram Pelvis Selective Or Supraselective  05/15/2014   CLINICAL DATA:  Symptomatic uterine fibroids, abnormal menstrual bleeding  EXAM: ULTRASOUND GUIDANCE FOR VASCULAR ACCESS  BILATERAL UTERINE ARTERY CATHETERIZATIONS, ANGIOGRAMS AND EMBOLIZATIONS.  Date:  05/15/2014 11:59 am2/15/2016 11:59 am  Radiologist:  M. Daryll Brod, MD  Guidance:  Ultrasound and fluoroscopic  FLUOROSCOPY TIME:  17 minutes 6 seconds  MEDICATIONS AND MEDICAL HISTORY: 3 g Ancef administered within 1 hour of the procedure, 2 mg Versed, 100 mcg fentanyl, 30 mg Toradol, 4 mg of Zofran  ANESTHESIA/SEDATION: 1 hour 25 minutes  CONTRAST:  180 cc Omnipaque 211  COMPLICATIONS: None immediate  PROCEDURE: Informed consent was obtained from the patient following explanation of the procedure, risks, benefits and alternatives. The patient understands, agrees and consents for the procedure. All questions were addressed. A time out was  performed.  Maximal barrier sterile technique utilized including caps, mask, sterile gowns, sterile gloves, large sterile drape, hand hygiene, and Betadine.  Under sterile conditions and local anesthesia, ultrasound micropuncture access was performed of the right common femoral artery. Images obtained for documentation. A guidewire was inserted followed by 5 French sheath. A 5 French C2 catheter was utilized to select the contralateral left internal iliac artery. Selective left internal iliac angiogram performed. The enlarged tortuous left uterine artery was localized. Micro catheter access was performed of the left uterine artery with a Renegade high flow catheter over a fathom guide wire. Selective left uterine angiogram performed. This demonstrates an enlarged tortuous left uterine artery supplying the large dominant midline uterine fibroid. From this location, embolization was performed of the left uterine artery with 2 vials of 500- 700 micron embospheres and 7 vials of 700 - 900 micron embospheres. Following embolization, the left uterine vascular territory has significant reduction in flow with some residual patent vertical branches.  Micro catheter was removed. C2 catheter was retracted to select the ipsilateral right internal iliac artery. Selective right internal iliac angiogram performed. The tortuous left uterine artery was localized. For micro catheterization, the Renegade high flow catheter over a double angled Glidewire was advanced peripherally into the right uterine artery. Selective right uterine angiogram demonstrates an enlarged tortuous right uterine artery. Particle embolization performed with 6 vials of 500-700 micron embospheres. Post embolization, there is near-complete stasis within the right uterine artery.  Catheters removed. Sheath removed. Hemostasis obtained with  manual compression. Patient tolerated the procedure well. No immediate complication.  IMPRESSION: Successful bilateral  uterine artery embolization side (UFE).   Electronically Signed   By: Jerilynn Mages.  Shick M.D.   On: 05/15/2014 13:31   Ir Angiogram Pelvis Selective Or Supraselective  05/15/2014   CLINICAL DATA:  Symptomatic uterine fibroids, abnormal menstrual bleeding  EXAM: ULTRASOUND GUIDANCE FOR VASCULAR ACCESS  BILATERAL UTERINE ARTERY CATHETERIZATIONS, ANGIOGRAMS AND EMBOLIZATIONS.  Date:  05/15/2014 11:59 am2/15/2016 11:59 am  Radiologist:  M. Daryll Brod, MD  Guidance:  Ultrasound and fluoroscopic  FLUOROSCOPY TIME:  17 minutes 6 seconds  MEDICATIONS AND MEDICAL HISTORY: 3 g Ancef administered within 1 hour of the procedure, 2 mg Versed, 100 mcg fentanyl, 30 mg Toradol, 4 mg of Zofran  ANESTHESIA/SEDATION: 1 hour 25 minutes  CONTRAST:  180 cc Omnipaque 009  COMPLICATIONS: None immediate  PROCEDURE: Informed consent was obtained from the patient following explanation of the procedure, risks, benefits and alternatives. The patient understands, agrees and consents for the procedure. All questions were addressed. A time out was performed.  Maximal barrier sterile technique utilized including caps, mask, sterile gowns, sterile gloves, large sterile drape, hand hygiene, and Betadine.  Under sterile conditions and local anesthesia, ultrasound micropuncture access was performed of the right common femoral artery. Images obtained for documentation. A guidewire was inserted followed by 5 French sheath. A 5 French C2 catheter was utilized to select the contralateral left internal iliac artery. Selective left internal iliac angiogram performed. The enlarged tortuous left uterine artery was localized. Micro catheter access was performed of the left uterine artery with a Renegade high flow catheter over a fathom guide wire. Selective left uterine angiogram performed. This demonstrates an enlarged tortuous left uterine artery supplying the large dominant midline uterine fibroid. From this location, embolization was performed of the left  uterine artery with 2 vials of 500- 700 micron embospheres and 7 vials of 700 - 900 micron embospheres. Following embolization, the left uterine vascular territory has significant reduction in flow with some residual patent vertical branches.  Micro catheter was removed. C2 catheter was retracted to select the ipsilateral right internal iliac artery. Selective right internal iliac angiogram performed. The tortuous left uterine artery was localized. For micro catheterization, the Renegade high flow catheter over a double angled Glidewire was advanced peripherally into the right uterine artery. Selective right uterine angiogram demonstrates an enlarged tortuous right uterine artery. Particle embolization performed with 6 vials of 500-700 micron embospheres. Post embolization, there is near-complete stasis within the right uterine artery.  Catheters removed. Sheath removed. Hemostasis obtained with manual compression. Patient tolerated the procedure well. No immediate complication.  IMPRESSION: Successful bilateral uterine artery embolization side (UFE).   Electronically Signed   By: Jerilynn Mages.  Shick M.D.   On: 05/15/2014 13:31   Ir Angiogram Selective Each Additional Vessel  05/15/2014   CLINICAL DATA:  Symptomatic uterine fibroids, abnormal menstrual bleeding  EXAM: ULTRASOUND GUIDANCE FOR VASCULAR ACCESS  BILATERAL UTERINE ARTERY CATHETERIZATIONS, ANGIOGRAMS AND EMBOLIZATIONS.  Date:  05/15/2014 11:59 am2/15/2016 11:59 am  Radiologist:  M. Daryll Brod, MD  Guidance:  Ultrasound and fluoroscopic  FLUOROSCOPY TIME:  17 minutes 6 seconds  MEDICATIONS AND MEDICAL HISTORY: 3 g Ancef administered within 1 hour of the procedure, 2 mg Versed, 100 mcg fentanyl, 30 mg Toradol, 4 mg of Zofran  ANESTHESIA/SEDATION: 1 hour 25 minutes  CONTRAST:  180 cc Omnipaque 381  COMPLICATIONS: None immediate  PROCEDURE: Informed consent was obtained from the patient following explanation of the procedure,  risks, benefits and alternatives. The  patient understands, agrees and consents for the procedure. All questions were addressed. A time out was performed.  Maximal barrier sterile technique utilized including caps, mask, sterile gowns, sterile gloves, large sterile drape, hand hygiene, and Betadine.  Under sterile conditions and local anesthesia, ultrasound micropuncture access was performed of the right common femoral artery. Images obtained for documentation. A guidewire was inserted followed by 5 French sheath. A 5 French C2 catheter was utilized to select the contralateral left internal iliac artery. Selective left internal iliac angiogram performed. The enlarged tortuous left uterine artery was localized. Micro catheter access was performed of the left uterine artery with a Renegade high flow catheter over a fathom guide wire. Selective left uterine angiogram performed. This demonstrates an enlarged tortuous left uterine artery supplying the large dominant midline uterine fibroid. From this location, embolization was performed of the left uterine artery with 2 vials of 500- 700 micron embospheres and 7 vials of 700 - 900 micron embospheres. Following embolization, the left uterine vascular territory has significant reduction in flow with some residual patent vertical branches.  Micro catheter was removed. C2 catheter was retracted to select the ipsilateral right internal iliac artery. Selective right internal iliac angiogram performed. The tortuous left uterine artery was localized. For micro catheterization, the Renegade high flow catheter over a double angled Glidewire was advanced peripherally into the right uterine artery. Selective right uterine angiogram demonstrates an enlarged tortuous right uterine artery. Particle embolization performed with 6 vials of 500-700 micron embospheres. Post embolization, there is near-complete stasis within the right uterine artery.  Catheters removed. Sheath removed. Hemostasis obtained with manual compression.  Patient tolerated the procedure well. No immediate complication.  IMPRESSION: Successful bilateral uterine artery embolization side (UFE).   Electronically Signed   By: Jerilynn Mages.  Shick M.D.   On: 05/15/2014 13:31   Ir Angiogram Selective Each Additional Vessel  05/15/2014   CLINICAL DATA:  Symptomatic uterine fibroids, abnormal menstrual bleeding  EXAM: ULTRASOUND GUIDANCE FOR VASCULAR ACCESS  BILATERAL UTERINE ARTERY CATHETERIZATIONS, ANGIOGRAMS AND EMBOLIZATIONS.  Date:  05/15/2014 11:59 am2/15/2016 11:59 am  Radiologist:  M. Daryll Brod, MD  Guidance:  Ultrasound and fluoroscopic  FLUOROSCOPY TIME:  17 minutes 6 seconds  MEDICATIONS AND MEDICAL HISTORY: 3 g Ancef administered within 1 hour of the procedure, 2 mg Versed, 100 mcg fentanyl, 30 mg Toradol, 4 mg of Zofran  ANESTHESIA/SEDATION: 1 hour 25 minutes  CONTRAST:  180 cc Omnipaque 616  COMPLICATIONS: None immediate  PROCEDURE: Informed consent was obtained from the patient following explanation of the procedure, risks, benefits and alternatives. The patient understands, agrees and consents for the procedure. All questions were addressed. A time out was performed.  Maximal barrier sterile technique utilized including caps, mask, sterile gowns, sterile gloves, large sterile drape, hand hygiene, and Betadine.  Under sterile conditions and local anesthesia, ultrasound micropuncture access was performed of the right common femoral artery. Images obtained for documentation. A guidewire was inserted followed by 5 French sheath. A 5 French C2 catheter was utilized to select the contralateral left internal iliac artery. Selective left internal iliac angiogram performed. The enlarged tortuous left uterine artery was localized. Micro catheter access was performed of the left uterine artery with a Renegade high flow catheter over a fathom guide wire. Selective left uterine angiogram performed. This demonstrates an enlarged tortuous left uterine artery supplying the large  dominant midline uterine fibroid. From this location, embolization was performed of the left uterine artery with 2 vials of  500- 700 micron embospheres and 7 vials of 700 - 900 micron embospheres. Following embolization, the left uterine vascular territory has significant reduction in flow with some residual patent vertical branches.  Micro catheter was removed. C2 catheter was retracted to select the ipsilateral right internal iliac artery. Selective right internal iliac angiogram performed. The tortuous left uterine artery was localized. For micro catheterization, the Renegade high flow catheter over a double angled Glidewire was advanced peripherally into the right uterine artery. Selective right uterine angiogram demonstrates an enlarged tortuous right uterine artery. Particle embolization performed with 6 vials of 500-700 micron embospheres. Post embolization, there is near-complete stasis within the right uterine artery.  Catheters removed. Sheath removed. Hemostasis obtained with manual compression. Patient tolerated the procedure well. No immediate complication.  IMPRESSION: Successful bilateral uterine artery embolization side (UFE).   Electronically Signed   By: Jerilynn Mages.  Shick M.D.   On: 05/15/2014 13:31   Ir US Guide Vasc Access Left  05/15/2014   CLINICAL DATA:  Symptomatic uterine fibroids, abnormal menstrual bleeding  EXAM: ULTRASOUND GUIDANCE FOR VASCULAR ACCESS  BILATERAL UTERINE ARTERY CATHETERIZATIONS, ANGIOGRAMS AND EMBOLIZATIONS.  Date:  05/15/2014 11:59 am2/15/2016 11:59 am  Radiologist:  M. Daryll Brod, MD  Guidance:  Ultrasound and fluoroscopic  FLUOROSCOPY TIME:  17 minutes 6 seconds  MEDICATIONS AND MEDICAL HISTORY: 3 g Ancef administered within 1 hour of the procedure, 2 mg Versed, 100 mcg fentanyl, 30 mg Toradol, 4 mg of Zofran  ANESTHESIA/SEDATION: 1 hour 25 minutes  CONTRAST:  180 cc Omnipaque 765  COMPLICATIONS: None immediate  PROCEDURE: Informed consent was obtained from the patient  following explanation of the procedure, risks, benefits and alternatives. The patient understands, agrees and consents for the procedure. All questions were addressed. A time out was performed.  Maximal barrier sterile technique utilized including caps, mask, sterile gowns, sterile gloves, large sterile drape, hand hygiene, and Betadine.  Under sterile conditions and local anesthesia, ultrasound micropuncture access was performed of the right common femoral artery. Images obtained for documentation. A guidewire was inserted followed by 5 French sheath. A 5 French C2 catheter was utilized to select the contralateral left internal iliac artery. Selective left internal iliac angiogram performed. The enlarged tortuous left uterine artery was localized. Micro catheter access was performed of the left uterine artery with a Renegade high flow catheter over a fathom guide wire. Selective left uterine angiogram performed. This demonstrates an enlarged tortuous left uterine artery supplying the large dominant midline uterine fibroid. From this location, embolization was performed of the left uterine artery with 2 vials of 500- 700 micron embospheres and 7 vials of 700 - 900 micron embospheres. Following embolization, the left uterine vascular territory has significant reduction in flow with some residual patent vertical branches.  Micro catheter was removed. C2 catheter was retracted to select the ipsilateral right internal iliac artery. Selective right internal iliac angiogram performed. The tortuous left uterine artery was localized. For micro catheterization, the Renegade high flow catheter over a double angled Glidewire was advanced peripherally into the right uterine artery. Selective right uterine angiogram demonstrates an enlarged tortuous right uterine artery. Particle embolization performed with 6 vials of 500-700 micron embospheres. Post embolization, there is near-complete stasis within the right uterine artery.   Catheters removed. Sheath removed. Hemostasis obtained with manual compression. Patient tolerated the procedure well. No immediate complication.  IMPRESSION: Successful bilateral uterine artery embolization side (UFE).   Electronically Signed   By: Jerilynn Mages.  Shick M.D.   On: 05/15/2014 13:31  Ir US Guide Vasc Access Right  05/15/2014   CLINICAL DATA:  Symptomatic uterine fibroids, abnormal menstrual bleeding  EXAM: ULTRASOUND GUIDANCE FOR VASCULAR ACCESS  BILATERAL UTERINE ARTERY CATHETERIZATIONS, ANGIOGRAMS AND EMBOLIZATIONS.  Date:  05/15/2014 11:59 am2/15/2016 11:59 am  Radiologist:  M. Daryll Brod, MD  Guidance:  Ultrasound and fluoroscopic  FLUOROSCOPY TIME:  17 minutes 6 seconds  MEDICATIONS AND MEDICAL HISTORY: 3 g Ancef administered within 1 hour of the procedure, 2 mg Versed, 100 mcg fentanyl, 30 mg Toradol, 4 mg of Zofran  ANESTHESIA/SEDATION: 1 hour 25 minutes  CONTRAST:  180 cc Omnipaque 518  COMPLICATIONS: None immediate  PROCEDURE: Informed consent was obtained from the patient following explanation of the procedure, risks, benefits and alternatives. The patient understands, agrees and consents for the procedure. All questions were addressed. A time out was performed.  Maximal barrier sterile technique utilized including caps, mask, sterile gowns, sterile gloves, large sterile drape, hand hygiene, and Betadine.  Under sterile conditions and local anesthesia, ultrasound micropuncture access was performed of the right common femoral artery. Images obtained for documentation. A guidewire was inserted followed by 5 French sheath. A 5 French C2 catheter was utilized to select the contralateral left internal iliac artery. Selective left internal iliac angiogram performed. The enlarged tortuous left uterine artery was localized. Micro catheter access was performed of the left uterine artery with a Renegade high flow catheter over a fathom guide wire. Selective left uterine angiogram performed. This demonstrates  an enlarged tortuous left uterine artery supplying the large dominant midline uterine fibroid. From this location, embolization was performed of the left uterine artery with 2 vials of 500- 700 micron embospheres and 7 vials of 700 - 900 micron embospheres. Following embolization, the left uterine vascular territory has significant reduction in flow with some residual patent vertical branches.  Micro catheter was removed. C2 catheter was retracted to select the ipsilateral right internal iliac artery. Selective right internal iliac angiogram performed. The tortuous left uterine artery was localized. For micro catheterization, the Renegade high flow catheter over a double angled Glidewire was advanced peripherally into the right uterine artery. Selective right uterine angiogram demonstrates an enlarged tortuous right uterine artery. Particle embolization performed with 6 vials of 500-700 micron embospheres. Post embolization, there is near-complete stasis within the right uterine artery.  Catheters removed. Sheath removed. Hemostasis obtained with manual compression. Patient tolerated the procedure well. No immediate complication.  IMPRESSION: Successful bilateral uterine artery embolization side (UFE).   Electronically Signed   By: Jerilynn Mages.  Shick M.D.   On: 05/15/2014 13:31   Ir Embo Tumor Organ Ischemia Infarct Inc Guide Roadmapping  05/15/2014   CLINICAL DATA:  Symptomatic uterine fibroids, abnormal menstrual bleeding  EXAM: ULTRASOUND GUIDANCE FOR VASCULAR ACCESS  BILATERAL UTERINE ARTERY CATHETERIZATIONS, ANGIOGRAMS AND EMBOLIZATIONS.  Date:  05/15/2014 11:59 am2/15/2016 11:59 am  Radiologist:  M. Daryll Brod, MD  Guidance:  Ultrasound and fluoroscopic  FLUOROSCOPY TIME:  17 minutes 6 seconds  MEDICATIONS AND MEDICAL HISTORY: 3 g Ancef administered within 1 hour of the procedure, 2 mg Versed, 100 mcg fentanyl, 30 mg Toradol, 4 mg of Zofran  ANESTHESIA/SEDATION: 1 hour 25 minutes  CONTRAST:  180 cc Omnipaque 841   COMPLICATIONS: None immediate  PROCEDURE: Informed consent was obtained from the patient following explanation of the procedure, risks, benefits and alternatives. The patient understands, agrees and consents for the procedure. All questions were addressed. A time out was performed.  Maximal barrier sterile technique utilized including caps, mask, sterile gowns, sterile  gloves, large sterile drape, hand hygiene, and Betadine.  Under sterile conditions and local anesthesia, ultrasound micropuncture access was performed of the right common femoral artery. Images obtained for documentation. A guidewire was inserted followed by 5 French sheath. A 5 French C2 catheter was utilized to select the contralateral left internal iliac artery. Selective left internal iliac angiogram performed. The enlarged tortuous left uterine artery was localized. Micro catheter access was performed of the left uterine artery with a Renegade high flow catheter over a fathom guide wire. Selective left uterine angiogram performed. This demonstrates an enlarged tortuous left uterine artery supplying the large dominant midline uterine fibroid. From this location, embolization was performed of the left uterine artery with 2 vials of 500- 700 micron embospheres and 7 vials of 700 - 900 micron embospheres. Following embolization, the left uterine vascular territory has significant reduction in flow with some residual patent vertical branches.  Micro catheter was removed. C2 catheter was retracted to select the ipsilateral right internal iliac artery. Selective right internal iliac angiogram performed. The tortuous left uterine artery was localized. For micro catheterization, the Renegade high flow catheter over a double angled Glidewire was advanced peripherally into the right uterine artery. Selective right uterine angiogram demonstrates an enlarged tortuous right uterine artery. Particle embolization performed with 6 vials of 500-700 micron  embospheres. Post embolization, there is near-complete stasis within the right uterine artery.  Catheters removed. Sheath removed. Hemostasis obtained with manual compression. Patient tolerated the procedure well. No immediate complication.  IMPRESSION: Successful bilateral uterine artery embolization side (UFE).   Electronically Signed   By: Jerilynn Mages.  Shick M.D.   On: 05/15/2014 13:31    Anti-infectives: Anti-infectives    None      Assessment/Plan: s/p UFE for symptomatic uterine fibroid; for overnight obs for pain control; f/u with Dr. Annamaria Boots in [place]  [facility] in 2-4 weeks     Naydeline Morace,D [facility] [place] 05/15/2014

## 2014-05-15 NOTE — Procedures (Signed)
Successful UFE  No comp Stable Overnight obs recovery Full report in pacs

## 2014-05-16 ENCOUNTER — Other Ambulatory Visit: Payer: Self-pay | Admitting: Radiology

## 2014-05-16 DIAGNOSIS — D251 Intramural leiomyoma of uterus: Secondary | ICD-10-CM

## 2014-05-16 DIAGNOSIS — Z95810 Presence of automatic (implantable) cardiac defibrillator: Secondary | ICD-10-CM | POA: Diagnosis not present

## 2014-05-16 DIAGNOSIS — E119 Type 2 diabetes mellitus without complications: Secondary | ICD-10-CM | POA: Diagnosis not present

## 2014-05-16 DIAGNOSIS — E669 Obesity, unspecified: Secondary | ICD-10-CM | POA: Diagnosis not present

## 2014-05-16 DIAGNOSIS — I110 Hypertensive heart disease with heart failure: Secondary | ICD-10-CM | POA: Diagnosis not present

## 2014-05-16 DIAGNOSIS — G4733 Obstructive sleep apnea (adult) (pediatric): Secondary | ICD-10-CM | POA: Diagnosis not present

## 2014-05-16 LAB — GLUCOSE, CAPILLARY
Glucose-Capillary: 83 mg/dL (ref 70–99)
Glucose-Capillary: 131 mg/dL — ABNORMAL HIGH (ref 70–99)

## 2014-05-16 MED ORDER — DOCUSATE SODIUM 100 MG PO CAPS: 100.0000 mg | ORAL_CAPSULE | Freq: Two times a day (BID) | ORAL | Status: DC

## 2014-05-16 MED ORDER — ONDANSETRON HCL 4 MG PO TABS: 4.0000 mg | ORAL_TABLET | Freq: Three times a day (TID) | ORAL | Status: DC | PRN

## 2014-05-16 MED ORDER — CETYLPYRIDINIUM CHLORIDE 0.05 % MT LIQD
7.0000 mL | Freq: Two times a day (BID) | OROMUCOSAL | Status: DC
  Administered 2014-05-16: 7 mL via OROMUCOSAL

## 2014-05-16 MED ORDER — HYDROCODONE-ACETAMINOPHEN 5-325 MG PO TABS: 1.0000 | ORAL_TABLET | Freq: Four times a day (QID) | ORAL | Status: DC | PRN

## 2014-05-16 MED ORDER — HYDROCODONE-ACETAMINOPHEN 5-325 MG PO TABS
1.0000 | ORAL_TABLET | Freq: Four times a day (QID) | ORAL | Status: DC | PRN
  Administered 2014-05-16: 2 via ORAL
  Filled 2014-05-16: qty 2

## 2014-05-16 MED ORDER — IBUPROFEN 200 MG PO TABS: 600.0000 mg | ORAL_TABLET | Freq: Four times a day (QID) | ORAL | Status: DC | PRN

## 2014-05-16 MED ORDER — CHLORHEXIDINE GLUCONATE 0.12 % MT SOLN
15.0000 mL | Freq: Two times a day (BID) | OROMUCOSAL | Status: DC
  Administered 2014-05-16: 15 mL via OROMUCOSAL
  Filled 2014-05-16 (×4): qty 15

## 2014-05-16 NOTE — Progress Notes (Signed)
Referring Physician(s): Galloway,Dionne  Subjective: Patient is s/p successful [place] for symptomatic fibroids. She rates her lower pelvic and lower back pain 8/10 but with pain medication this is controlled. She has ate a full breakfast without nausea or vomiting. She has had her foley removed and urinated without difficulty. She does c/o some vaginal spotting. She denies any chest pain or shortness of breath, she denies any fever or chills. She denies any right groin access site pain.   Allergies: Morphine and related  Medications: Prior to Admission medications   Medication Sig Start Date End Date Taking? Authorizing Provider  acetaminophen (TYLENOL) 500 MG tablet Take 1,000 mg by mouth every 6 (six) hours as needed for mild pain or moderate pain.    Historical Provider, MD  carvedilol (COREG) 25 MG tablet Take 1 tablet (25 mg total) by mouth 2 (two) times daily with a meal. 04/12/14   Minus Breeding, MD  Fish Oil-Cholecalciferol (FISH OIL + D3 PO) Take 1 capsule by mouth daily.    Historical Provider, MD  lisinopril (PRINIVIL,ZESTRIL) 20 MG tablet Take 1 tablet (20 mg total) by mouth 2 (two) times daily. 04/12/14   Minus Breeding, MD  metFORMIN (GLUCOPHAGE) 1000 MG tablet Take 1,000 mg by mouth 2 (two) times daily with a meal.    Historical Provider, MD  MILK THISTLE PO Take 1 capsule by mouth daily.    Historical Provider, MD  Multiple Vitamin (MULTIVITAMIN) tablet Take 1 tablet by mouth daily.    Historical Provider, MD  spironolactone (ALDACTONE) 25 MG tablet TAKE ONE TABLET EACH DAY 04/12/14   Minus Breeding, MD   Vital Signs: BP 106/71 mmHg  Pulse 90  Temp(Src) 98 F (36.7 C) (Axillary)  Resp 20  Ht 5\' 7"  (1.702 m)  Wt 301 lb (136.533 kg)  BMI 47.13 kg/m2  SpO2 90%  LMP 03/14/2014  Physical Exam General: A&Ox3, NAD, standing up Heart: RRR without M/G/R Lungs: CTA B/L  Abd: Soft, pelvic TTP, (+) BS, ND Ext: RCFA access site soft, NT, no signs of bleeding/hematoma, DP 1+  B/L  Imaging: Ir Angiogram Pelvis Selective Or Supraselective  05/15/2014   CLINICAL DATA:  Symptomatic uterine fibroids, abnormal menstrual bleeding  EXAM: ULTRASOUND GUIDANCE FOR VASCULAR ACCESS  BILATERAL UTERINE ARTERY CATHETERIZATIONS, ANGIOGRAMS AND EMBOLIZATIONS.  Date:  05/15/2014 11:59 am2/15/2016 11:59 am  Radiologist:  M. Daryll Brod, MD  Guidance:  Ultrasound and fluoroscopic  FLUOROSCOPY TIME:  17 minutes 6 seconds  MEDICATIONS AND MEDICAL HISTORY: 3 g Ancef administered within 1 hour of the procedure, 2 mg Versed, 100 mcg fentanyl, 30 mg Toradol, 4 mg of Zofran  ANESTHESIA/SEDATION: 1 hour 25 minutes  CONTRAST:  180 cc Omnipaque 786  COMPLICATIONS: None immediate  PROCEDURE: Informed consent was obtained from the patient following explanation of the procedure, risks, benefits and alternatives. The patient understands, agrees and consents for the procedure. All questions were addressed. A time out was performed.  Maximal barrier sterile technique utilized including caps, mask, sterile gowns, sterile gloves, large sterile drape, hand hygiene, and Betadine.  Under sterile conditions and local anesthesia, ultrasound micropuncture access was performed of the right common femoral artery. Images obtained for documentation. A guidewire was inserted followed by 5 French sheath. A 5 French C2 catheter was utilized to select the contralateral left internal iliac artery. Selective left internal iliac angiogram performed. The enlarged tortuous left uterine artery was localized. Micro catheter access was performed of the left uterine artery with a Renegade high flow catheter over a  fathom guide wire. Selective left uterine angiogram performed. This demonstrates an enlarged tortuous left uterine artery supplying the large dominant midline uterine fibroid. From this location, embolization was performed of the left uterine artery with 2 vials of 500- 700 micron embospheres and 7 vials of 700 - 900 micron  embospheres. Following embolization, the left uterine vascular territory has significant reduction in flow with some residual patent vertical branches.  Micro catheter was removed. C2 catheter was retracted to select the ipsilateral right internal iliac artery. Selective right internal iliac angiogram performed. The tortuous left uterine artery was localized. For micro catheterization, the Renegade high flow catheter over a double angled Glidewire was advanced peripherally into the right uterine artery. Selective right uterine angiogram demonstrates an enlarged tortuous right uterine artery. Particle embolization performed with 6 vials of 500-700 micron embospheres. Post embolization, there is near-complete stasis within the right uterine artery.  Catheters removed. Sheath removed. Hemostasis obtained with manual compression. Patient tolerated the procedure well. No immediate complication.  IMPRESSION: Successful bilateral uterine artery embolization side (UFE).   Electronically Signed   By: Jerilynn Mages.  Shick M.D.   On: 05/15/2014 13:31   Ir Angiogram Pelvis Selective Or Supraselective  05/15/2014   CLINICAL DATA:  Symptomatic uterine fibroids, abnormal menstrual bleeding  EXAM: ULTRASOUND GUIDANCE FOR VASCULAR ACCESS  BILATERAL UTERINE ARTERY CATHETERIZATIONS, ANGIOGRAMS AND EMBOLIZATIONS.  Date:  05/15/2014 11:59 am2/15/2016 11:59 am  Radiologist:  M. Daryll Brod, MD  Guidance:  Ultrasound and fluoroscopic  FLUOROSCOPY TIME:  17 minutes 6 seconds  MEDICATIONS AND MEDICAL HISTORY: 3 g Ancef administered within 1 hour of the procedure, 2 mg Versed, 100 mcg fentanyl, 30 mg Toradol, 4 mg of Zofran  ANESTHESIA/SEDATION: 1 hour 25 minutes  CONTRAST:  180 cc Omnipaque 229  COMPLICATIONS: None immediate  PROCEDURE: Informed consent was obtained from the patient following explanation of the procedure, risks, benefits and alternatives. The patient understands, agrees and consents for the procedure. All questions were addressed.  A time out was performed.  Maximal barrier sterile technique utilized including caps, mask, sterile gowns, sterile gloves, large sterile drape, hand hygiene, and Betadine.  Under sterile conditions and local anesthesia, ultrasound micropuncture access was performed of the right common femoral artery. Images obtained for documentation. A guidewire was inserted followed by 5 French sheath. A 5 French C2 catheter was utilized to select the contralateral left internal iliac artery. Selective left internal iliac angiogram performed. The enlarged tortuous left uterine artery was localized. Micro catheter access was performed of the left uterine artery with a Renegade high flow catheter over a fathom guide wire. Selective left uterine angiogram performed. This demonstrates an enlarged tortuous left uterine artery supplying the large dominant midline uterine fibroid. From this location, embolization was performed of the left uterine artery with 2 vials of 500- 700 micron embospheres and 7 vials of 700 - 900 micron embospheres. Following embolization, the left uterine vascular territory has significant reduction in flow with some residual patent vertical branches.  Micro catheter was removed. C2 catheter was retracted to select the ipsilateral right internal iliac artery. Selective right internal iliac angiogram performed. The tortuous left uterine artery was localized. For micro catheterization, the Renegade high flow catheter over a double angled Glidewire was advanced peripherally into the right uterine artery. Selective right uterine angiogram demonstrates an enlarged tortuous right uterine artery. Particle embolization performed with 6 vials of 500-700 micron embospheres. Post embolization, there is near-complete stasis within the right uterine artery.  Catheters removed. Sheath removed. Hemostasis obtained  with manual compression. Patient tolerated the procedure well. No immediate complication.  IMPRESSION: Successful  bilateral uterine artery embolization side (UFE).   Electronically Signed   By: Jerilynn Mages.  Shick M.D.   On: 05/15/2014 13:31   Ir Angiogram Selective Each Additional Vessel  05/15/2014   CLINICAL DATA:  Symptomatic uterine fibroids, abnormal menstrual bleeding  EXAM: ULTRASOUND GUIDANCE FOR VASCULAR ACCESS  BILATERAL UTERINE ARTERY CATHETERIZATIONS, ANGIOGRAMS AND EMBOLIZATIONS.  Date:  05/15/2014 11:59 am2/15/2016 11:59 am  Radiologist:  M. Daryll Brod, MD  Guidance:  Ultrasound and fluoroscopic  FLUOROSCOPY TIME:  17 minutes 6 seconds  MEDICATIONS AND MEDICAL HISTORY: 3 g Ancef administered within 1 hour of the procedure, 2 mg Versed, 100 mcg fentanyl, 30 mg Toradol, 4 mg of Zofran  ANESTHESIA/SEDATION: 1 hour 25 minutes  CONTRAST:  180 cc Omnipaque 128  COMPLICATIONS: None immediate  PROCEDURE: Informed consent was obtained from the patient following explanation of the procedure, risks, benefits and alternatives. The patient understands, agrees and consents for the procedure. All questions were addressed. A time out was performed.  Maximal barrier sterile technique utilized including caps, mask, sterile gowns, sterile gloves, large sterile drape, hand hygiene, and Betadine.  Under sterile conditions and local anesthesia, ultrasound micropuncture access was performed of the right common femoral artery. Images obtained for documentation. A guidewire was inserted followed by 5 French sheath. A 5 French C2 catheter was utilized to select the contralateral left internal iliac artery. Selective left internal iliac angiogram performed. The enlarged tortuous left uterine artery was localized. Micro catheter access was performed of the left uterine artery with a Renegade high flow catheter over a fathom guide wire. Selective left uterine angiogram performed. This demonstrates an enlarged tortuous left uterine artery supplying the large dominant midline uterine fibroid. From this location, embolization was performed of the  left uterine artery with 2 vials of 500- 700 micron embospheres and 7 vials of 700 - 900 micron embospheres. Following embolization, the left uterine vascular territory has significant reduction in flow with some residual patent vertical branches.  Micro catheter was removed. C2 catheter was retracted to select the ipsilateral right internal iliac artery. Selective right internal iliac angiogram performed. The tortuous left uterine artery was localized. For micro catheterization, the Renegade high flow catheter over a double angled Glidewire was advanced peripherally into the right uterine artery. Selective right uterine angiogram demonstrates an enlarged tortuous right uterine artery. Particle embolization performed with 6 vials of 500-700 micron embospheres. Post embolization, there is near-complete stasis within the right uterine artery.  Catheters removed. Sheath removed. Hemostasis obtained with manual compression. Patient tolerated the procedure well. No immediate complication.  IMPRESSION: Successful bilateral uterine artery embolization side (UFE).   Electronically Signed   By: Jerilynn Mages.  Shick M.D.   On: 05/15/2014 13:31   Ir Angiogram Selective Each Additional Vessel  05/15/2014   CLINICAL DATA:  Symptomatic uterine fibroids, abnormal menstrual bleeding  EXAM: ULTRASOUND GUIDANCE FOR VASCULAR ACCESS  BILATERAL UTERINE ARTERY CATHETERIZATIONS, ANGIOGRAMS AND EMBOLIZATIONS.  Date:  05/15/2014 11:59 am2/15/2016 11:59 am  Radiologist:  M. Daryll Brod, MD  Guidance:  Ultrasound and fluoroscopic  FLUOROSCOPY TIME:  17 minutes 6 seconds  MEDICATIONS AND MEDICAL HISTORY: 3 g Ancef administered within 1 hour of the procedure, 2 mg Versed, 100 mcg fentanyl, 30 mg Toradol, 4 mg of Zofran  ANESTHESIA/SEDATION: 1 hour 25 minutes  CONTRAST:  180 cc Omnipaque 786  COMPLICATIONS: None immediate  PROCEDURE: Informed consent was obtained from the patient following explanation of the  procedure, risks, benefits and alternatives.  The patient understands, agrees and consents for the procedure. All questions were addressed. A time out was performed.  Maximal barrier sterile technique utilized including caps, mask, sterile gowns, sterile gloves, large sterile drape, hand hygiene, and Betadine.  Under sterile conditions and local anesthesia, ultrasound micropuncture access was performed of the right common femoral artery. Images obtained for documentation. A guidewire was inserted followed by 5 French sheath. A 5 French C2 catheter was utilized to select the contralateral left internal iliac artery. Selective left internal iliac angiogram performed. The enlarged tortuous left uterine artery was localized. Micro catheter access was performed of the left uterine artery with a Renegade high flow catheter over a fathom guide wire. Selective left uterine angiogram performed. This demonstrates an enlarged tortuous left uterine artery supplying the large dominant midline uterine fibroid. From this location, embolization was performed of the left uterine artery with 2 vials of 500- 700 micron embospheres and 7 vials of 700 - 900 micron embospheres. Following embolization, the left uterine vascular territory has significant reduction in flow with some residual patent vertical branches.  Micro catheter was removed. C2 catheter was retracted to select the ipsilateral right internal iliac artery. Selective right internal iliac angiogram performed. The tortuous left uterine artery was localized. For micro catheterization, the Renegade high flow catheter over a double angled Glidewire was advanced peripherally into the right uterine artery. Selective right uterine angiogram demonstrates an enlarged tortuous right uterine artery. Particle embolization performed with 6 vials of 500-700 micron embospheres. Post embolization, there is near-complete stasis within the right uterine artery.  Catheters removed. Sheath removed. Hemostasis obtained with manual  compression. Patient tolerated the procedure well. No immediate complication.  IMPRESSION: Successful bilateral uterine artery embolization side (UFE).   Electronically Signed   By: Jerilynn Mages.  Shick M.D.   On: 05/15/2014 13:31   Ir US Guide Vasc Access Left  05/15/2014   CLINICAL DATA:  Symptomatic uterine fibroids, abnormal menstrual bleeding  EXAM: ULTRASOUND GUIDANCE FOR VASCULAR ACCESS  BILATERAL UTERINE ARTERY CATHETERIZATIONS, ANGIOGRAMS AND EMBOLIZATIONS.  Date:  05/15/2014 11:59 am2/15/2016 11:59 am  Radiologist:  M. Daryll Brod, MD  Guidance:  Ultrasound and fluoroscopic  FLUOROSCOPY TIME:  17 minutes 6 seconds  MEDICATIONS AND MEDICAL HISTORY: 3 g Ancef administered within 1 hour of the procedure, 2 mg Versed, 100 mcg fentanyl, 30 mg Toradol, 4 mg of Zofran  ANESTHESIA/SEDATION: 1 hour 25 minutes  CONTRAST:  180 cc Omnipaque 035  COMPLICATIONS: None immediate  PROCEDURE: Informed consent was obtained from the patient following explanation of the procedure, risks, benefits and alternatives. The patient understands, agrees and consents for the procedure. All questions were addressed. A time out was performed.  Maximal barrier sterile technique utilized including caps, mask, sterile gowns, sterile gloves, large sterile drape, hand hygiene, and Betadine.  Under sterile conditions and local anesthesia, ultrasound micropuncture access was performed of the right common femoral artery. Images obtained for documentation. A guidewire was inserted followed by 5 French sheath. A 5 French C2 catheter was utilized to select the contralateral left internal iliac artery. Selective left internal iliac angiogram performed. The enlarged tortuous left uterine artery was localized. Micro catheter access was performed of the left uterine artery with a Renegade high flow catheter over a fathom guide wire. Selective left uterine angiogram performed. This demonstrates an enlarged tortuous left uterine artery supplying the large  dominant midline uterine fibroid. From this location, embolization was performed of the left uterine artery with 2 vials  of 500- 700 micron embospheres and 7 vials of 700 - 900 micron embospheres. Following embolization, the left uterine vascular territory has significant reduction in flow with some residual patent vertical branches.  Micro catheter was removed. C2 catheter was retracted to select the ipsilateral right internal iliac artery. Selective right internal iliac angiogram performed. The tortuous left uterine artery was localized. For micro catheterization, the Renegade high flow catheter over a double angled Glidewire was advanced peripherally into the right uterine artery. Selective right uterine angiogram demonstrates an enlarged tortuous right uterine artery. Particle embolization performed with 6 vials of 500-700 micron embospheres. Post embolization, there is near-complete stasis within the right uterine artery.  Catheters removed. Sheath removed. Hemostasis obtained with manual compression. Patient tolerated the procedure well. No immediate complication.  IMPRESSION: Successful bilateral uterine artery embolization side (UFE).   Electronically Signed   By: Jerilynn Mages.  Shick M.D.   On: 05/15/2014 13:31   Ir US Guide Vasc Access Right  05/15/2014   CLINICAL DATA:  Symptomatic uterine fibroids, abnormal menstrual bleeding  EXAM: ULTRASOUND GUIDANCE FOR VASCULAR ACCESS  BILATERAL UTERINE ARTERY CATHETERIZATIONS, ANGIOGRAMS AND EMBOLIZATIONS.  Date:  05/15/2014 11:59 am2/15/2016 11:59 am  Radiologist:  M. Daryll Brod, MD  Guidance:  Ultrasound and fluoroscopic  FLUOROSCOPY TIME:  17 minutes 6 seconds  MEDICATIONS AND MEDICAL HISTORY: 3 g Ancef administered within 1 hour of the procedure, 2 mg Versed, 100 mcg fentanyl, 30 mg Toradol, 4 mg of Zofran  ANESTHESIA/SEDATION: 1 hour 25 minutes  CONTRAST:  180 cc Omnipaque 625  COMPLICATIONS: None immediate  PROCEDURE: Informed consent was obtained from the patient  following explanation of the procedure, risks, benefits and alternatives. The patient understands, agrees and consents for the procedure. All questions were addressed. A time out was performed.  Maximal barrier sterile technique utilized including caps, mask, sterile gowns, sterile gloves, large sterile drape, hand hygiene, and Betadine.  Under sterile conditions and local anesthesia, ultrasound micropuncture access was performed of the right common femoral artery. Images obtained for documentation. A guidewire was inserted followed by 5 French sheath. A 5 French C2 catheter was utilized to select the contralateral left internal iliac artery. Selective left internal iliac angiogram performed. The enlarged tortuous left uterine artery was localized. Micro catheter access was performed of the left uterine artery with a Renegade high flow catheter over a fathom guide wire. Selective left uterine angiogram performed. This demonstrates an enlarged tortuous left uterine artery supplying the large dominant midline uterine fibroid. From this location, embolization was performed of the left uterine artery with 2 vials of 500- 700 micron embospheres and 7 vials of 700 - 900 micron embospheres. Following embolization, the left uterine vascular territory has significant reduction in flow with some residual patent vertical branches.  Micro catheter was removed. C2 catheter was retracted to select the ipsilateral right internal iliac artery. Selective right internal iliac angiogram performed. The tortuous left uterine artery was localized. For micro catheterization, the Renegade high flow catheter over a double angled Glidewire was advanced peripherally into the right uterine artery. Selective right uterine angiogram demonstrates an enlarged tortuous right uterine artery. Particle embolization performed with 6 vials of 500-700 micron embospheres. Post embolization, there is near-complete stasis within the right uterine artery.   Catheters removed. Sheath removed. Hemostasis obtained with manual compression. Patient tolerated the procedure well. No immediate complication.  IMPRESSION: Successful bilateral uterine artery embolization side (UFE).   Electronically Signed   By: Jerilynn Mages.  Shick M.D.   On: 05/15/2014 13:31  Ir Embo Tumor Organ Ischemia Infarct Inc Guide Roadmapping  05/15/2014   CLINICAL DATA:  Symptomatic uterine fibroids, abnormal menstrual bleeding  EXAM: ULTRASOUND GUIDANCE FOR VASCULAR ACCESS  BILATERAL UTERINE ARTERY CATHETERIZATIONS, ANGIOGRAMS AND EMBOLIZATIONS.  Date:  05/15/2014 11:59 am2/15/2016 11:59 am  Radiologist:  M. Daryll Brod, MD  Guidance:  Ultrasound and fluoroscopic  FLUOROSCOPY TIME:  17 minutes 6 seconds  MEDICATIONS AND MEDICAL HISTORY: 3 g Ancef administered within 1 hour of the procedure, 2 mg Versed, 100 mcg fentanyl, 30 mg Toradol, 4 mg of Zofran  ANESTHESIA/SEDATION: 1 hour 25 minutes  CONTRAST:  180 cc Omnipaque 638  COMPLICATIONS: None immediate  PROCEDURE: Informed consent was obtained from the patient following explanation of the procedure, risks, benefits and alternatives. The patient understands, agrees and consents for the procedure. All questions were addressed. A time out was performed.  Maximal barrier sterile technique utilized including caps, mask, sterile gowns, sterile gloves, large sterile drape, hand hygiene, and Betadine.  Under sterile conditions and local anesthesia, ultrasound micropuncture access was performed of the right common femoral artery. Images obtained for documentation. A guidewire was inserted followed by 5 French sheath. A 5 French C2 catheter was utilized to select the contralateral left internal iliac artery. Selective left internal iliac angiogram performed. The enlarged tortuous left uterine artery was localized. Micro catheter access was performed of the left uterine artery with a Renegade high flow catheter over a fathom guide wire. Selective left uterine angiogram  performed. This demonstrates an enlarged tortuous left uterine artery supplying the large dominant midline uterine fibroid. From this location, embolization was performed of the left uterine artery with 2 vials of 500- 700 micron embospheres and 7 vials of 700 - 900 micron embospheres. Following embolization, the left uterine vascular territory has significant reduction in flow with some residual patent vertical branches.  Micro catheter was removed. C2 catheter was retracted to select the ipsilateral right internal iliac artery. Selective right internal iliac angiogram performed. The tortuous left uterine artery was localized. For micro catheterization, the Renegade high flow catheter over a double angled Glidewire was advanced peripherally into the right uterine artery. Selective right uterine angiogram demonstrates an enlarged tortuous right uterine artery. Particle embolization performed with 6 vials of 500-700 micron embospheres. Post embolization, there is near-complete stasis within the right uterine artery.  Catheters removed. Sheath removed. Hemostasis obtained with manual compression. Patient tolerated the procedure well. No immediate complication.  IMPRESSION: Successful bilateral uterine artery embolization side (UFE).   Electronically Signed   By: Jerilynn Mages.  Shick M.D.   On: 05/15/2014 13:31    Labs:  CBC:  Recent Labs  05/15/14 0810  WBC 9.5  HGB 9.5*  HCT 33.2*  PLT 253    COAGS:  Recent Labs  05/15/14 0810  INR 1.20  APTT 25    BMP:  Recent Labs  03/09/14 1254 04/20/14 1144 05/15/14 0810  NA  --  137 134*  K  --  4.4 4.1  CL  --  102 99  CO2  --  22 24  GLUCOSE  --  98 126*  BUN 11 18 15   CALCIUM  --  8.4 8.6  CREATININE 0.89 1.04 1.00  GFRNONAA 76  --  64*  GFRAA 87  --  74*    Assessment and Plan: Symptomatic uterine fibroids  S/p successful [place] Some vaginal spotting today, pain controlled with pain medication, however still requiring oxygen switching to oral  pain medication. Tolerating diet  Will allow some time  for ambulation and weaning off oxygen, possible discharge later this afternoon.    SignedHedy Jacob 05/16/2014, 11:14 AM   I spent a total of 15 in face to face in clinical consultation/evaluation, greater than 50% of which was counseling/coordinating care for [place].

## 2014-05-16 NOTE — Progress Notes (Signed)
UR completed 

## 2014-05-16 NOTE — Plan of Care (Signed)
Problem: Phase I Progression Outcomes Goal: OOB as tolerated unless otherwise ordered Outcome: Progressing oob with supervison/assist to bathroom

## 2014-05-16 NOTE — Discharge Summary (Signed)
Patient ID: Erin Jarvis MRN: [id number] DOB/AGE: 08-26-1965 49 y.o.  Admit date: 05/15/2014 Discharge date: 05/16/2014  Admission Diagnoses: Symptomatic uterine fibroids  Discharge Diagnoses:  Active Problems:   Intramural leiomyoma of uterus   Fibroid S/p successful [place]   Discharged Condition: Good, stable.  Hospital Course: Erin Jarvis is a 49 y.o. female with history of symptomatic uterine fibroid who was evaluated in consultation for elective bilateral uterine artery embolization on 03/09/2014 see full consult note. She presented to [facility] short stay 05/15/14 to undergo the elective procedure.   She underwent the procedure without complications and was admitted overnight for pain control and observation. Her foley catheter has been removed and she has urinated without difficulty. She has tolerated oral pain medication and a regular diet without nausea/vomiting or reaction. She denies any chest pain, palpitations or shortness of breath. She has ambulated the halls without c/o lightheadedness or dizziness on room air with good oxygen saturation. She denies any right groin site pain or bleeding. She does c/o some minimal lower pelvic/lowerback pain that is well controlled with oral pain medications. She also c/o some vaginal bleeding. She states overall she feels well and is ready to be discharged home with her mother.   Discharge instructions were given to the patient as well as pain medications, stool softener and anti-nausea medication. Our office will contact the patient for a follow-up appointment in 2-4 weeks. She has been deemed stable for discharge.  Consults: None.  Treatments: Successful Uterine fibroid embolization.   Discharge Exam: Blood pressure 117/76, pulse 88, temperature 98.2 F (36.8 C), temperature source Oral, resp. rate 16, height 5\' 7"  (1.702 m), weight 301 lb (136.533 kg), last menstrual period 03/14/2014, SpO2 99 %.  General: A&Ox3, NAD,  sitting up in bed Heart: RRR without M/G/R Lungs: CTA B/L without w/r/r Abd: Soft, minimal pelvic TTP, (+) BS, ND Ext: RCFA access site soft, NT, no signs of bleeding/hematoma, DP 1+ B/L  Disposition: Discharge home.   Discharge Instructions    Call MD for:  difficulty breathing, headache or visual disturbances    Complete by:  As directed      Call MD for:  extreme fatigue    Complete by:  As directed      Call MD for:  hives    Complete by:  As directed      Call MD for:  persistant dizziness or light-headedness    Complete by:  As directed      Call MD for:  persistant nausea and vomiting    Complete by:  As directed      Call MD for:  redness, tenderness, or signs of infection (pain, swelling, redness, odor or green/yellow discharge around incision site)    Complete by:  As directed      Call MD for:  severe uncontrolled pain    Complete by:  As directed      Call MD for:  temperature >100.4    Complete by:  As directed      Diet - low sodium heart healthy    Complete by:  As directed      Driving Restrictions    Complete by:  As directed   No driving x 2 days     Increase activity slowly    Complete by:  As directed      Lifting restrictions    Complete by:  As directed   No lifting > 15 lbs x 3 days  Remove dressing in 24 hours    Complete by:  As directed             Medication List    TAKE these medications        acetaminophen 500 MG tablet  Commonly known as:  TYLENOL  Take 1,000 mg by mouth every 6 (six) hours as needed for mild pain or moderate pain.     carvedilol 25 MG tablet  Commonly known as:  COREG  Take 1 tablet (25 mg total) by mouth 2 (two) times daily with a meal.     docusate sodium 100 MG capsule  Commonly known as:  COLACE  Take 1 capsule (100 mg total) by mouth 2 (two) times daily.     FISH OIL + D3 PO  Take 1 capsule by mouth daily.     HYDROcodone-acetaminophen 5-325 MG per tablet  Commonly known as:  NORCO/VICODIN  Take 1-2  tablets by mouth every 6 (six) hours as needed for moderate pain.     ibuprofen 200 MG tablet  Commonly known as:  ADVIL  Take 3 tablets (600 mg total) by mouth every 6 (six) hours as needed.     lisinopril 20 MG tablet  Commonly known as:  PRINIVIL,ZESTRIL  Take 1 tablet (20 mg total) by mouth 2 (two) times daily.     metFORMIN 1000 MG tablet  Commonly known as:  GLUCOPHAGE  Take 1,000 mg by mouth 2 (two) times daily with a meal.     MILK THISTLE PO  Take 1 capsule by mouth daily.     multivitamin tablet  Take 1 tablet by mouth daily.     ondansetron 4 MG tablet  Commonly known as:  ZOFRAN  Take 1 tablet (4 mg total) by mouth every 8 (eight) hours as needed for nausea or vomiting.     spironolactone 25 MG tablet  Commonly known as:  ALDACTONE  TAKE ONE TABLET EACH DAY           Follow-up Information    Follow up with [facility], MICHAEL, MD In 2 weeks.   Specialty:  Interventional Radiology   Why:  Follow-up 2-4 weeks, our office will call patient with appointment time [phone]   Contact information:   [address] [place] [address] [phone]       Signed: Hedy Jacob 05/16/2014, 2:42 PM   I have spent Greater Than 30 Minutes discharging Erin Jarvis.

## 2014-05-16 NOTE — Progress Notes (Signed)
Wasted dilaudid PCA with Dorene Ar RN. PCA was started pre-transfer so could not waste in pyxis.   We wasted 2.7 mg of dilaudid.

## 2014-05-16 NOTE — Progress Notes (Signed)
Patient has voided twice this shift since foley d/c'd. First blood tinged. Last orangish in color. Patient with some vaginal bleeding as well. Staining bed linen. Will monitor. Little use of PCA last night but pain "good " per patient.

## 2014-05-16 NOTE — Progress Notes (Signed)
Stable at time of discharge. I reviewed discharge education with patient and daughter. They verbalized understanding and had no further questions.

## 2014-05-17 ENCOUNTER — Telehealth: Payer: Self-pay | Admitting: Radiology

## 2014-05-17 NOTE — Progress Notes (Signed)
Patient with complaints of dysuria and history of UTI's. Urine Cx revealed E. Coli. I have spoke with the patient regarding her normal treatment for UTI and called in Bactrim DS to her pharmacy today.   Tsosie Billing PA-C Interventional Radiology  05/17/2014 10:32 AM

## 2014-05-18 ENCOUNTER — Encounter: Payer: Self-pay | Admitting: Cardiology

## 2014-05-18 LAB — URINE CULTURE: Colony Count: 40000

## 2014-05-24 ENCOUNTER — Telehealth: Payer: Self-pay | Admitting: Radiology

## 2014-05-24 DIAGNOSIS — R110 Nausea: Secondary | ICD-10-CM

## 2014-05-24 NOTE — Telephone Encounter (Signed)
1761  Return call to patient.  Patient is 1 wk s/p [place].  States that she has been nauseated for several days.  Zofran has not been helping.   Afebrile.  Cramping (like menstrual cramps).  Denies spotting.  Poor appetite.  Has not been taking Ibuprofen as directed.   Dr. Daryll Brod notified.  0830  Patient called.  Rx phoned in to [facility], [place], [place]:  Phenergan 25 mg po Q6 hr prn Dispense 15, No refills. Encouraged patient to drink plenty of fluids, soft diet as tolerated, take Ibuprofen as directed.  Phenergan as needed.  Patient's 4 week follow up appointment scheduled for June 13, 2014 at 10:00 am.   Reece Levy, RN 05/24/2014 9:51 AM

## 2014-05-25 NOTE — Telephone Encounter (Signed)
Rx phoned in 05/24/2014

## 2014-06-01 ENCOUNTER — Telehealth: Payer: Self-pay | Admitting: *Deleted

## 2014-06-01 ENCOUNTER — Encounter: Payer: Self-pay | Admitting: *Deleted

## 2014-06-01 ENCOUNTER — Ambulatory Visit (INDEPENDENT_AMBULATORY_CARE_PROVIDER_SITE_OTHER): Payer: Medicare Other | Admitting: *Deleted

## 2014-06-01 DIAGNOSIS — Z95810 Presence of automatic (implantable) cardiac defibrillator: Secondary | ICD-10-CM | POA: Diagnosis not present

## 2014-06-01 DIAGNOSIS — O903 Peripartum cardiomyopathy: Secondary | ICD-10-CM

## 2014-06-01 NOTE — Telephone Encounter (Signed)
I spoke with the patient. 

## 2014-06-01 NOTE — Progress Notes (Signed)
EPIC Encounter for ICM Monitoring  Patient Name: Erin Jarvis is a 49 y.o. female Date: 06/01/2014 Primary Care Physican: [name], MD Primary Cardiologist: Hochrein Electrophysiologist: Allred Dry Weight: 301 lbs       In the past month, have you:  1. Gained more than 2 pounds in a day or more than 5 pounds in a week? no  2. Had changes in your medications (with verification of current medications)? Yes. Taking ibuprofen- post ablation of a fibroid tumor (2/15) and Bactrim- for a UTI  3. Had more shortness of breath than is usual for you? No. This has actually improved for her post ablation of her fibroid  4. Limited your activity because of shortness of breath? no  5. Not been able to sleep because of shortness of breath? no  6. Had increased swelling in your feet or ankles? No. She did have some swelling post ablation of her lower extremities, but this has resolved. She does continue to have some swelling/ inflammation in her abdominal area post procedure, but this is slowly resolving.   7. Had symptoms of dehydration (dizziness, dry mouth, increased thirst, decreased urine output) no  8. Had changes in sodium restriction? no  9. Been compliant with medication? Yes   ICM trend:   Follow-up plan: ICM clinic phone appointment : 07/03/14. The patient's optivol readings have been elevated from ~ 11/2- present. Her impedence does look somewhat improved over the last few days. The patient is about 2 weeks post ablation of a fibroid tumor. She is still having some abdominal swelling post procedure, but she feels this is getting better. She has no additional swelling at present and her weight is stable. She was told it may take 3-6 months for the tumor to decrease in size. She has a follow up appointment on 3/16 with Dr. Percival Spanish. I have advised her to keep this follow up with him. I have made no changes today. I will forward to Dr. Percival Spanish and Dr.  Rayann Heman for review and  recommendations.   Copy of note sent to patient's primary care physician, primary cardiologist, and device following physician.  Alvis Lemmings, RN, BSN 06/01/2014 3:40 PM

## 2014-06-01 NOTE — Telephone Encounter (Signed)
ICM transmission received. I left a message for the patient to call. 

## 2014-06-13 ENCOUNTER — Ambulatory Visit
Admission: RE | Admit: 2014-06-13 | Discharge: 2014-06-13 | Disposition: A | Discharge status: Home / Self Care | Payer: Medicare Other | Source: Ambulatory Visit | Attending: Radiology | Admitting: Radiology

## 2014-06-13 DIAGNOSIS — D251 Intramural leiomyoma of uterus: Secondary | ICD-10-CM

## 2014-06-13 DIAGNOSIS — D259 Leiomyoma of uterus, unspecified: Secondary | ICD-10-CM | POA: Diagnosis not present

## 2014-06-13 HISTORY — PX: IR GENERIC HISTORICAL: IMG1180011

## 2014-06-13 NOTE — Progress Notes (Signed)
Patient ID: Erin Jarvis, female   DOB: 04-13-1965, 49 y.o.   MRN: [id number]    Chief Complaint: Chief Complaint  Patient presents with  . Follow-up    4 wk s/p [place]    Referring Physician(s): [place], [place]  History of Present Illness: Erin Jarvis is a 49 y.o. female with a dominant symptomatic large uterine fibroid. She underwent successful uterine fibroid embolization at [facility] approximately 4 weeks ago. She presented with lower abdominal and pelvic crampy abdominal pain and abnormal menstrual bleeding related to the fibroid. Since the procedure, she has recovered at home very well. No current significant abdominal pain, cramping or discomfort. Mild fatigue. She has persistent nausea which responds to by mouth Phenergan. She currently is not taking any pain medication. She reports a mild developing skin rash over the lower abdomen and pelvic area. Overall she is doing very well.  Past Medical History  Diagnosis Date  . Postpartum cardiomyopathy     Last echocardiogram was last year and EF 45 -50 %  . Hypertensive cardiovascular disease   . Seasonal allergies   . Palpitations   . Ventricular tachycardia     s/p ICD Implantation (MDT Secura VR)  . Sleep apnea     complaint with CPAP  . Obesity   . Diabetes mellitus without complication   . Hypertension   . AICD (automatic cardioverter/defibrillator) present 2002, 2010    secondary to post-partum cardiomyopathy (1996)  . Presence of permanent cardiac pacemaker   . CHF (congestive heart failure)   . Shortness of breath dyspnea     Past Surgical History  Procedure Laterality Date  . Cardiac defibrillator placement  08/12/2000, 11/11/2008    Most recent Generator MDT Secura VR placed in [place]  . Cesarean section  1996  . Cholecystectomy  2004    Allergies: Morphine and related  Medications: Prior to Admission medications   Medication Sig Start Date End Date Taking? Authorizing Provider    carvedilol (COREG) 25 MG tablet Take 1 tablet (25 mg total) by mouth 2 (two) times daily with a meal. 04/12/14  Yes Minus Breeding, MD  docusate sodium (COLACE) 100 MG capsule Take 1 capsule (100 mg total) by mouth 2 (two) times daily. 05/16/14  Yes Hedy Jacob, PA-C  Fish Oil-Cholecalciferol (FISH OIL + D3 PO) Take 1 capsule by mouth daily.   Yes Historical Provider, MD  ibuprofen (ADVIL) 200 MG tablet Take 3 tablets (600 mg total) by mouth every 6 (six) hours as needed. 05/16/14  Yes Hedy Jacob, PA-C  lisinopril (PRINIVIL,ZESTRIL) 20 MG tablet Take 1 tablet (20 mg total) by mouth 2 (two) times daily. 04/12/14  Yes Minus Breeding, MD  metFORMIN (GLUCOPHAGE) 1000 MG tablet Take 1,000 mg by mouth 2 (two) times daily with a meal.   Yes Historical Provider, MD  MILK THISTLE PO Take 1 capsule by mouth daily.   Yes Historical Provider, MD  Multiple Vitamin (MULTIVITAMIN) tablet Take 1 tablet by mouth daily.   Yes Historical Provider, MD  spironolactone (ALDACTONE) 25 MG tablet TAKE ONE TABLET EACH DAY 04/12/14  Yes Minus Breeding, MD  acetaminophen (TYLENOL) 500 MG tablet Take 1,000 mg by mouth every 6 (six) hours as needed for mild pain or moderate pain.    Historical Provider, MD  HYDROcodone-acetaminophen (NORCO/VICODIN) 5-325 MG per tablet Take 1-2 tablets by mouth every 6 (six) hours as needed for moderate pain. Patient not taking: Reported on 06/13/2014 05/16/14   Hedy Jacob,  PA-C  ondansetron (ZOFRAN) 4 MG tablet Take 1 tablet (4 mg total) by mouth every 8 (eight) hours as needed for nausea or vomiting. Patient not taking: Reported on 06/13/2014 05/16/14   Hedy Jacob, PA-C     Family History  Problem Relation Age of Onset  . Other Mother     has chronic pain syndrome from DDD with spinal cord stimulator    History   Social History  . Marital Status: Divorced    Spouse Name: N/A  . Number of Children: N/A  . Years of Education: N/A   Social History Main Topics  . Smoking  status: Never Smoker   . Smokeless tobacco: Never Used  . Alcohol Use: No  . Drug Use: No  . Sexual Activity: Not on file   Other Topics Concern  . None   Social History Narrative   Lives with her parents and 32 year old daughter in [place].    Review of Systems: A 12 point ROS discussed and pertinent positives are indicated in the HPI above.  All other systems are negative.  Review of Systems  Constitutional: Positive for fatigue. Negative for fever, activity change, appetite change and unexpected weight change.  Respiratory: Negative for cough, chest tightness and wheezing.   Cardiovascular: Negative for chest pain and palpitations.  Gastrointestinal: Negative for abdominal distention.  Skin: Positive for rash.    Vital Signs: BP 156/78 mmHg  Pulse 91  Temp(Src) 98.4 F (36.9 C) (Oral)  Resp 14  SpO2 98%  LMP 05/30/2013 (Approximate)  Physical Exam  Constitutional: She appears well-developed and well-nourished. No distress.  Morbidly obese female.  Abdominal: Soft. Bowel sounds are normal. She exhibits no distension.  Morbidly obese abdomen. No palpable organomegaly or mass.  Lower abdominal skin demonstrates mild redness, thickening and induration compatible with a developing rash, possibly related to antifungal powder the patient has been using since discharge. No skin ulceration. No drainage or discharge. No obvious signs of infection or cellulitis.  Skin: Skin is warm and dry. Rash noted. She is not diaphoretic. There is erythema.  Psychiatric: She has a normal mood and affect. Her behavior is normal.     Imaging: Ir Angiogram Pelvis Selective Or Supraselective  05/15/2014   CLINICAL DATA:  Symptomatic uterine fibroids, abnormal menstrual bleeding  EXAM: ULTRASOUND GUIDANCE FOR VASCULAR ACCESS  BILATERAL UTERINE ARTERY CATHETERIZATIONS, ANGIOGRAMS AND EMBOLIZATIONS.  Date:  05/15/2014 11:59 am2/15/2016 11:59 am  Radiologist:  M. Daryll Brod, MD  Guidance:   Ultrasound and fluoroscopic  FLUOROSCOPY TIME:  17 minutes 6 seconds  MEDICATIONS AND MEDICAL HISTORY: 3 g Ancef administered within 1 hour of the procedure, 2 mg Versed, 100 mcg fentanyl, 30 mg Toradol, 4 mg of Zofran  ANESTHESIA/SEDATION: 1 hour 25 minutes  CONTRAST:  180 cc Omnipaque 893  COMPLICATIONS: None immediate  PROCEDURE: Informed consent was obtained from the patient following explanation of the procedure, risks, benefits and alternatives. The patient understands, agrees and consents for the procedure. All questions were addressed. A time out was performed.  Maximal barrier sterile technique utilized including caps, mask, sterile gowns, sterile gloves, large sterile drape, hand hygiene, and Betadine.  Under sterile conditions and local anesthesia, ultrasound micropuncture access was performed of the right common femoral artery. Images obtained for documentation. A guidewire was inserted followed by 5 French sheath. A 5 French C2 catheter was utilized to select the contralateral left internal iliac artery. Selective left internal iliac angiogram performed. The enlarged tortuous left uterine artery  was localized. Micro catheter access was performed of the left uterine artery with a Renegade high flow catheter over a fathom guide wire. Selective left uterine angiogram performed. This demonstrates an enlarged tortuous left uterine artery supplying the large dominant midline uterine fibroid. From this location, embolization was performed of the left uterine artery with 2 vials of 500- 700 micron embospheres and 7 vials of 700 - 900 micron embospheres. Following embolization, the left uterine vascular territory has significant reduction in flow with some residual patent vertical branches.  Micro catheter was removed. C2 catheter was retracted to select the ipsilateral right internal iliac artery. Selective right internal iliac angiogram performed. The tortuous left uterine artery was localized. For micro  catheterization, the Renegade high flow catheter over a double angled Glidewire was advanced peripherally into the right uterine artery. Selective right uterine angiogram demonstrates an enlarged tortuous right uterine artery. Particle embolization performed with 6 vials of 500-700 micron embospheres. Post embolization, there is near-complete stasis within the right uterine artery.  Catheters removed. Sheath removed. Hemostasis obtained with manual compression. Patient tolerated the procedure well. No immediate complication.  IMPRESSION: Successful bilateral uterine artery embolization side (UFE).   Electronically Signed   By: Jerilynn Mages.  Jorma Tassinari M.D.   On: 05/15/2014 13:31   Ir Angiogram Pelvis Selective Or Supraselective  05/15/2014   CLINICAL DATA:  Symptomatic uterine fibroids, abnormal menstrual bleeding  EXAM: ULTRASOUND GUIDANCE FOR VASCULAR ACCESS  BILATERAL UTERINE ARTERY CATHETERIZATIONS, ANGIOGRAMS AND EMBOLIZATIONS.  Date:  05/15/2014 11:59 am2/15/2016 11:59 am  Radiologist:  M. Daryll Brod, MD  Guidance:  Ultrasound and fluoroscopic  FLUOROSCOPY TIME:  17 minutes 6 seconds  MEDICATIONS AND MEDICAL HISTORY: 3 g Ancef administered within 1 hour of the procedure, 2 mg Versed, 100 mcg fentanyl, 30 mg Toradol, 4 mg of Zofran  ANESTHESIA/SEDATION: 1 hour 25 minutes  CONTRAST:  180 cc Omnipaque 270  COMPLICATIONS: None immediate  PROCEDURE: Informed consent was obtained from the patient following explanation of the procedure, risks, benefits and alternatives. The patient understands, agrees and consents for the procedure. All questions were addressed. A time out was performed.  Maximal barrier sterile technique utilized including caps, mask, sterile gowns, sterile gloves, large sterile drape, hand hygiene, and Betadine.  Under sterile conditions and local anesthesia, ultrasound micropuncture access was performed of the right common femoral artery. Images obtained for documentation. A guidewire was inserted followed  by 5 French sheath. A 5 French C2 catheter was utilized to select the contralateral left internal iliac artery. Selective left internal iliac angiogram performed. The enlarged tortuous left uterine artery was localized. Micro catheter access was performed of the left uterine artery with a Renegade high flow catheter over a fathom guide wire. Selective left uterine angiogram performed. This demonstrates an enlarged tortuous left uterine artery supplying the large dominant midline uterine fibroid. From this location, embolization was performed of the left uterine artery with 2 vials of 500- 700 micron embospheres and 7 vials of 700 - 900 micron embospheres. Following embolization, the left uterine vascular territory has significant reduction in flow with some residual patent vertical branches.  Micro catheter was removed. C2 catheter was retracted to select the ipsilateral right internal iliac artery. Selective right internal iliac angiogram performed. The tortuous left uterine artery was localized. For micro catheterization, the Renegade high flow catheter over a double angled Glidewire was advanced peripherally into the right uterine artery. Selective right uterine angiogram demonstrates an enlarged tortuous right uterine artery. Particle embolization performed with 6 vials of 500-700  micron embospheres. Post embolization, there is near-complete stasis within the right uterine artery.  Catheters removed. Sheath removed. Hemostasis obtained with manual compression. Patient tolerated the procedure well. No immediate complication.  IMPRESSION: Successful bilateral uterine artery embolization side (UFE).   Electronically Signed   By: Jerilynn Mages.  Bryceson Grape M.D.   On: 05/15/2014 13:31   Ir Angiogram Selective Each Additional Vessel  05/15/2014   CLINICAL DATA:  Symptomatic uterine fibroids, abnormal menstrual bleeding  EXAM: ULTRASOUND GUIDANCE FOR VASCULAR ACCESS  BILATERAL UTERINE ARTERY CATHETERIZATIONS, ANGIOGRAMS AND  EMBOLIZATIONS.  Date:  05/15/2014 11:59 am2/15/2016 11:59 am  Radiologist:  M. Daryll Brod, MD  Guidance:  Ultrasound and fluoroscopic  FLUOROSCOPY TIME:  17 minutes 6 seconds  MEDICATIONS AND MEDICAL HISTORY: 3 g Ancef administered within 1 hour of the procedure, 2 mg Versed, 100 mcg fentanyl, 30 mg Toradol, 4 mg of Zofran  ANESTHESIA/SEDATION: 1 hour 25 minutes  CONTRAST:  180 cc Omnipaque 144  COMPLICATIONS: None immediate  PROCEDURE: Informed consent was obtained from the patient following explanation of the procedure, risks, benefits and alternatives. The patient understands, agrees and consents for the procedure. All questions were addressed. A time out was performed.  Maximal barrier sterile technique utilized including caps, mask, sterile gowns, sterile gloves, large sterile drape, hand hygiene, and Betadine.  Under sterile conditions and local anesthesia, ultrasound micropuncture access was performed of the right common femoral artery. Images obtained for documentation. A guidewire was inserted followed by 5 French sheath. A 5 French C2 catheter was utilized to select the contralateral left internal iliac artery. Selective left internal iliac angiogram performed. The enlarged tortuous left uterine artery was localized. Micro catheter access was performed of the left uterine artery with a Renegade high flow catheter over a fathom guide wire. Selective left uterine angiogram performed. This demonstrates an enlarged tortuous left uterine artery supplying the large dominant midline uterine fibroid. From this location, embolization was performed of the left uterine artery with 2 vials of 500- 700 micron embospheres and 7 vials of 700 - 900 micron embospheres. Following embolization, the left uterine vascular territory has significant reduction in flow with some residual patent vertical branches.  Micro catheter was removed. C2 catheter was retracted to select the ipsilateral right internal iliac artery.  Selective right internal iliac angiogram performed. The tortuous left uterine artery was localized. For micro catheterization, the Renegade high flow catheter over a double angled Glidewire was advanced peripherally into the right uterine artery. Selective right uterine angiogram demonstrates an enlarged tortuous right uterine artery. Particle embolization performed with 6 vials of 500-700 micron embospheres. Post embolization, there is near-complete stasis within the right uterine artery.  Catheters removed. Sheath removed. Hemostasis obtained with manual compression. Patient tolerated the procedure well. No immediate complication.  IMPRESSION: Successful bilateral uterine artery embolization side (UFE).   Electronically Signed   By: Jerilynn Mages.  Tiersa Dayley M.D.   On: 05/15/2014 13:31   Ir Angiogram Selective Each Additional Vessel  05/15/2014   CLINICAL DATA:  Symptomatic uterine fibroids, abnormal menstrual bleeding  EXAM: ULTRASOUND GUIDANCE FOR VASCULAR ACCESS  BILATERAL UTERINE ARTERY CATHETERIZATIONS, ANGIOGRAMS AND EMBOLIZATIONS.  Date:  05/15/2014 11:59 am2/15/2016 11:59 am  Radiologist:  M. Daryll Brod, MD  Guidance:  Ultrasound and fluoroscopic  FLUOROSCOPY TIME:  17 minutes 6 seconds  MEDICATIONS AND MEDICAL HISTORY: 3 g Ancef administered within 1 hour of the procedure, 2 mg Versed, 100 mcg fentanyl, 30 mg Toradol, 4 mg of Zofran  ANESTHESIA/SEDATION: 1 hour 25 minutes  CONTRAST:  180  cc Omnipaque 595  COMPLICATIONS: None immediate  PROCEDURE: Informed consent was obtained from the patient following explanation of the procedure, risks, benefits and alternatives. The patient understands, agrees and consents for the procedure. All questions were addressed. A time out was performed.  Maximal barrier sterile technique utilized including caps, mask, sterile gowns, sterile gloves, large sterile drape, hand hygiene, and Betadine.  Under sterile conditions and local anesthesia, ultrasound micropuncture access was  performed of the right common femoral artery. Images obtained for documentation. A guidewire was inserted followed by 5 French sheath. A 5 French C2 catheter was utilized to select the contralateral left internal iliac artery. Selective left internal iliac angiogram performed. The enlarged tortuous left uterine artery was localized. Micro catheter access was performed of the left uterine artery with a Renegade high flow catheter over a fathom guide wire. Selective left uterine angiogram performed. This demonstrates an enlarged tortuous left uterine artery supplying the large dominant midline uterine fibroid. From this location, embolization was performed of the left uterine artery with 2 vials of 500- 700 micron embospheres and 7 vials of 700 - 900 micron embospheres. Following embolization, the left uterine vascular territory has significant reduction in flow with some residual patent vertical branches.  Micro catheter was removed. C2 catheter was retracted to select the ipsilateral right internal iliac artery. Selective right internal iliac angiogram performed. The tortuous left uterine artery was localized. For micro catheterization, the Renegade high flow catheter over a double angled Glidewire was advanced peripherally into the right uterine artery. Selective right uterine angiogram demonstrates an enlarged tortuous right uterine artery. Particle embolization performed with 6 vials of 500-700 micron embospheres. Post embolization, there is near-complete stasis within the right uterine artery.  Catheters removed. Sheath removed. Hemostasis obtained with manual compression. Patient tolerated the procedure well. No immediate complication.  IMPRESSION: Successful bilateral uterine artery embolization side (UFE).   Electronically Signed   By: Jerilynn Mages.  Harini Dearmond M.D.   On: 05/15/2014 13:31   Ir US Guide Vasc Access Left  05/15/2014   CLINICAL DATA:  Symptomatic uterine fibroids, abnormal menstrual bleeding  EXAM:  ULTRASOUND GUIDANCE FOR VASCULAR ACCESS  BILATERAL UTERINE ARTERY CATHETERIZATIONS, ANGIOGRAMS AND EMBOLIZATIONS.  Date:  05/15/2014 11:59 am2/15/2016 11:59 am  Radiologist:  M. Daryll Brod, MD  Guidance:  Ultrasound and fluoroscopic  FLUOROSCOPY TIME:  17 minutes 6 seconds  MEDICATIONS AND MEDICAL HISTORY: 3 g Ancef administered within 1 hour of the procedure, 2 mg Versed, 100 mcg fentanyl, 30 mg Toradol, 4 mg of Zofran  ANESTHESIA/SEDATION: 1 hour 25 minutes  CONTRAST:  180 cc Omnipaque 638  COMPLICATIONS: None immediate  PROCEDURE: Informed consent was obtained from the patient following explanation of the procedure, risks, benefits and alternatives. The patient understands, agrees and consents for the procedure. All questions were addressed. A time out was performed.  Maximal barrier sterile technique utilized including caps, mask, sterile gowns, sterile gloves, large sterile drape, hand hygiene, and Betadine.  Under sterile conditions and local anesthesia, ultrasound micropuncture access was performed of the right common femoral artery. Images obtained for documentation. A guidewire was inserted followed by 5 French sheath. A 5 French C2 catheter was utilized to select the contralateral left internal iliac artery. Selective left internal iliac angiogram performed. The enlarged tortuous left uterine artery was localized. Micro catheter access was performed of the left uterine artery with a Renegade high flow catheter over a fathom guide wire. Selective left uterine angiogram performed. This demonstrates an enlarged tortuous left uterine artery supplying the  large dominant midline uterine fibroid. From this location, embolization was performed of the left uterine artery with 2 vials of 500- 700 micron embospheres and 7 vials of 700 - 900 micron embospheres. Following embolization, the left uterine vascular territory has significant reduction in flow with some residual patent vertical branches.  Micro catheter was  removed. C2 catheter was retracted to select the ipsilateral right internal iliac artery. Selective right internal iliac angiogram performed. The tortuous left uterine artery was localized. For micro catheterization, the Renegade high flow catheter over a double angled Glidewire was advanced peripherally into the right uterine artery. Selective right uterine angiogram demonstrates an enlarged tortuous right uterine artery. Particle embolization performed with 6 vials of 500-700 micron embospheres. Post embolization, there is near-complete stasis within the right uterine artery.  Catheters removed. Sheath removed. Hemostasis obtained with manual compression. Patient tolerated the procedure well. No immediate complication.  IMPRESSION: Successful bilateral uterine artery embolization side (UFE).   Electronically Signed   By: Jerilynn Mages.  Jaquelyne Firkus M.D.   On: 05/15/2014 13:31   Ir US Guide Vasc Access Right  05/15/2014   CLINICAL DATA:  Symptomatic uterine fibroids, abnormal menstrual bleeding  EXAM: ULTRASOUND GUIDANCE FOR VASCULAR ACCESS  BILATERAL UTERINE ARTERY CATHETERIZATIONS, ANGIOGRAMS AND EMBOLIZATIONS.  Date:  05/15/2014 11:59 am2/15/2016 11:59 am  Radiologist:  M. Daryll Brod, MD  Guidance:  Ultrasound and fluoroscopic  FLUOROSCOPY TIME:  17 minutes 6 seconds  MEDICATIONS AND MEDICAL HISTORY: 3 g Ancef administered within 1 hour of the procedure, 2 mg Versed, 100 mcg fentanyl, 30 mg Toradol, 4 mg of Zofran  ANESTHESIA/SEDATION: 1 hour 25 minutes  CONTRAST:  180 cc Omnipaque 159  COMPLICATIONS: None immediate  PROCEDURE: Informed consent was obtained from the patient following explanation of the procedure, risks, benefits and alternatives. The patient understands, agrees and consents for the procedure. All questions were addressed. A time out was performed.  Maximal barrier sterile technique utilized including caps, mask, sterile gowns, sterile gloves, large sterile drape, hand hygiene, and Betadine.  Under sterile  conditions and local anesthesia, ultrasound micropuncture access was performed of the right common femoral artery. Images obtained for documentation. A guidewire was inserted followed by 5 French sheath. A 5 French C2 catheter was utilized to select the contralateral left internal iliac artery. Selective left internal iliac angiogram performed. The enlarged tortuous left uterine artery was localized. Micro catheter access was performed of the left uterine artery with a Renegade high flow catheter over a fathom guide wire. Selective left uterine angiogram performed. This demonstrates an enlarged tortuous left uterine artery supplying the large dominant midline uterine fibroid. From this location, embolization was performed of the left uterine artery with 2 vials of 500- 700 micron embospheres and 7 vials of 700 - 900 micron embospheres. Following embolization, the left uterine vascular territory has significant reduction in flow with some residual patent vertical branches.  Micro catheter was removed. C2 catheter was retracted to select the ipsilateral right internal iliac artery. Selective right internal iliac angiogram performed. The tortuous left uterine artery was localized. For micro catheterization, the Renegade high flow catheter over a double angled Glidewire was advanced peripherally into the right uterine artery. Selective right uterine angiogram demonstrates an enlarged tortuous right uterine artery. Particle embolization performed with 6 vials of 500-700 micron embospheres. Post embolization, there is near-complete stasis within the right uterine artery.  Catheters removed. Sheath removed. Hemostasis obtained with manual compression. Patient tolerated the procedure well. No immediate complication.  IMPRESSION: Successful bilateral uterine artery embolization  side (UFE).   Electronically Signed   By: Jerilynn Mages.  Eleanore Junio M.D.   On: 05/15/2014 13:31   Ir Embo Tumor Organ Ischemia Infarct Inc Guide  Roadmapping  05/15/2014   CLINICAL DATA:  Symptomatic uterine fibroids, abnormal menstrual bleeding  EXAM: ULTRASOUND GUIDANCE FOR VASCULAR ACCESS  BILATERAL UTERINE ARTERY CATHETERIZATIONS, ANGIOGRAMS AND EMBOLIZATIONS.  Date:  05/15/2014 11:59 am2/15/2016 11:59 am  Radiologist:  M. Daryll Brod, MD  Guidance:  Ultrasound and fluoroscopic  FLUOROSCOPY TIME:  17 minutes 6 seconds  MEDICATIONS AND MEDICAL HISTORY: 3 g Ancef administered within 1 hour of the procedure, 2 mg Versed, 100 mcg fentanyl, 30 mg Toradol, 4 mg of Zofran  ANESTHESIA/SEDATION: 1 hour 25 minutes  CONTRAST:  180 cc Omnipaque 035  COMPLICATIONS: None immediate  PROCEDURE: Informed consent was obtained from the patient following explanation of the procedure, risks, benefits and alternatives. The patient understands, agrees and consents for the procedure. All questions were addressed. A time out was performed.  Maximal barrier sterile technique utilized including caps, mask, sterile gowns, sterile gloves, large sterile drape, hand hygiene, and Betadine.  Under sterile conditions and local anesthesia, ultrasound micropuncture access was performed of the right common femoral artery. Images obtained for documentation. A guidewire was inserted followed by 5 French sheath. A 5 French C2 catheter was utilized to select the contralateral left internal iliac artery. Selective left internal iliac angiogram performed. The enlarged tortuous left uterine artery was localized. Micro catheter access was performed of the left uterine artery with a Renegade high flow catheter over a fathom guide wire. Selective left uterine angiogram performed. This demonstrates an enlarged tortuous left uterine artery supplying the large dominant midline uterine fibroid. From this location, embolization was performed of the left uterine artery with 2 vials of 500- 700 micron embospheres and 7 vials of 700 - 900 micron embospheres. Following embolization, the left uterine vascular  territory has significant reduction in flow with some residual patent vertical branches.  Micro catheter was removed. C2 catheter was retracted to select the ipsilateral right internal iliac artery. Selective right internal iliac angiogram performed. The tortuous left uterine artery was localized. For micro catheterization, the Renegade high flow catheter over a double angled Glidewire was advanced peripherally into the right uterine artery. Selective right uterine angiogram demonstrates an enlarged tortuous right uterine artery. Particle embolization performed with 6 vials of 500-700 micron embospheres. Post embolization, there is near-complete stasis within the right uterine artery.  Catheters removed. Sheath removed. Hemostasis obtained with manual compression. Patient tolerated the procedure well. No immediate complication.  IMPRESSION: Successful bilateral uterine artery embolization side (UFE).   Electronically Signed   By: Jerilynn Mages.  Jayel Inks M.D.   On: 05/15/2014 13:31    Labs:  CBC:  Recent Labs  05/15/14 0810  WBC 9.5  HGB 9.5*  HCT 33.2*  PLT 253    COAGS:  Recent Labs  05/15/14 0810  INR 1.20  APTT 25    BMP:  Recent Labs  03/09/14 1254 04/20/14 1144 05/15/14 0810  NA  --  137 134*  K  --  4.4 4.1  CL  --  102 99  CO2  --  22 24  GLUCOSE  --  98 126*  BUN 11 18 15   CALCIUM  --  8.4 8.6  CREATININE 0.89 1.04 1.00  GFRNONAA 76  --  64*  GFRAA 87  --  74*     Assessment and Plan:  4 weeks status post successful uterine fibroid embolization. Patient is  recovering at home very well. Mild residual nausea responding to by mouth Phenergan. No significant abdominal or pelvic pain. No physical limitations. No abnormal discharge or signs of infection.  Developing lower abdominal skin rash with mild redness, skin thickening and induration in a morbidly obese female. Only changes since the procedure is an antifungal powder she has been using since hospital discharge. This will  be discontinued. Recommended over-the-counter Benadryl and hydrocortisone cream. Follow-up with her primary care physician. If this does not improve consider dermatology consultation.   SignedGreggory Keen 06/13/2014, 11:06 AM   I spent a total of  25 Minutes in face to face in clinical consultation, greater than 50% of which was counseling/coordinating care for this patient with systematic uterine fibroids, status post embolization.

## 2014-06-14 ENCOUNTER — Ambulatory Visit (INDEPENDENT_AMBULATORY_CARE_PROVIDER_SITE_OTHER): Payer: Medicare Other | Admitting: Cardiology

## 2014-06-14 ENCOUNTER — Encounter: Payer: Self-pay | Admitting: Cardiology

## 2014-06-14 VITALS — BP 122/90 | HR 92 | Ht 67.0 in | Wt 308.0 lb

## 2014-06-14 DIAGNOSIS — I429 Cardiomyopathy, unspecified: Secondary | ICD-10-CM | POA: Diagnosis not present

## 2014-06-14 DIAGNOSIS — I428 Other cardiomyopathies: Secondary | ICD-10-CM

## 2014-06-14 MED ORDER — LISINOPRIL 20 MG PO TABS: 20.0000 mg | ORAL_TABLET | Freq: Two times a day (BID) | ORAL | Status: DC

## 2014-06-14 NOTE — Patient Instructions (Signed)
The current medical regimen is effective;  continue present plan and medications.  Follow up in 6 months with Dr. Skains.  You will receive a letter in the mail 2 months before you are due.  Please call us when you receive this letter to schedule your follow up appointment.  Thank you for choosing [facility]!!     

## 2014-06-14 NOTE — Progress Notes (Signed)
HPI The patient has a history of a postpartum cardiomyopathy in [place]. Her cardiomyopathy was diagnosed in 1996 and she says her EF was about 20%. After treatment it went up to about 45%. While living in [place] she had sustained ventricular tachycardia requiring placement of an ICD.  A MUGA demonstrated an EF of about 35%.  At the last visit I restarted spironolactone which had been stopped because of some low blood pressures.  Since I last saw her she said procedure for ablation of uterine fibroids. She still has significant abdominal swelling and actually some induration/erythema diffusely on her belly. She did tolerate the spironolactone.  She did have follow-up basic metabolic profile which was okay. She has no cardiac complaints. The patient denies any new symptoms such as chest discomfort, neck or arm discomfort. There has been no new shortness of breath, PND or orthopnea. There have been no reported palpitations, presyncope or syncope.  She predominantly has complaints related swollen abdomen.  Allergies  Allergen Reactions  . Morphine And Related Nausea Only and Other (See Comments)    Feels sick,and like she is going to pass out; can't breathe, starts sweating    Current Outpatient Prescriptions  Medication Sig Dispense Refill  . carvedilol (COREG) 25 MG tablet Take 1 tablet (25 mg total) by mouth 2 (two) times daily with a meal. 180 tablet 1  . docusate sodium (COLACE) 100 MG capsule Take 1 capsule (100 mg total) by mouth 2 (two) times daily. 10 capsule 0  . Fish Oil-Cholecalciferol (FISH OIL + D3 PO) Take 1 capsule by mouth daily.    Marland Kitchen ibuprofen (ADVIL) 200 MG tablet Take 3 tablets (600 mg total) by mouth every 6 (six) hours as needed. 30 tablet 0  . lisinopril (PRINIVIL,ZESTRIL) 20 MG tablet Take 1 tablet (20 mg total) by mouth 2 (two) times daily. 180 tablet 1  . metFORMIN (GLUCOPHAGE) 1000 MG tablet Take 1,000 mg by mouth 2 (two) times daily with a meal.    .  MILK THISTLE PO Take 1 capsule by mouth daily.    . Multiple Vitamin (MULTIVITAMIN) tablet Take 1 tablet by mouth daily.    . promethazine (PHENERGAN) 12.5 MG tablet Take 12.5 mg by mouth every 6 (six) hours as needed for nausea or vomiting.    Marland Kitchen spironolactone (ALDACTONE) 25 MG tablet TAKE ONE TABLET EACH DAY 90 tablet 1  . acetaminophen (TYLENOL) 500 MG tablet Take 1,000 mg by mouth every 6 (six) hours as needed for mild pain or moderate pain.     No current facility-administered medications for this visit.    Past Medical History  Diagnosis Date  . Postpartum cardiomyopathy     Last echocardiogram was last year and EF 45 -50 %  . Hypertensive cardiovascular disease   . Seasonal allergies   . Palpitations   . Ventricular tachycardia     s/p ICD Implantation (MDT Secura VR)  . Sleep apnea     complaint with CPAP  . Obesity   . Diabetes mellitus without complication   . Hypertension   . AICD (automatic cardioverter/defibrillator) present 2002, 2010    secondary to post-partum cardiomyopathy (1996)  . Presence of permanent cardiac pacemaker   . CHF (congestive heart failure)   . Shortness of breath dyspnea     Past Surgical History  Procedure Laterality Date  . Cardiac defibrillator placement  08/12/2000, 11/11/2008    Most recent Generator MDT Secura VR placed in [place]  . Cesarean  section  1996  . Cholecystectomy  2004    ROS:  As stated in the HPI and negative for all other systems.   PHYSICAL EXAM BP 122/90 mmHg  Pulse 92  Ht 5\' 7"  (1.702 m)  Wt 308 lb (139.708 kg)  BMI 48.23 kg/m2  LMP 05/30/2013 (Approximate) GENERAL:  Well appearing HEENT:  Pupils equal round and reactive, fundi not visualized, oral mucosa unremarkable NECK:  No jugular venous distention, waveform within normal limits, carotid upstroke brisk and symmetric, no bruits, no thyromegaly LUNGS:  Clear to auscultation bilaterally CHEST:  Well healed ICD pocket HEART:  PMI not displaced or  sustained,S1 and S2 within normal limits, no S3, no S4, no clicks, no rubs, no murmurs ABD:  Flat, positive bowel sounds normal in frequency in pitch, no bruits, no rebound, no guarding, no midline pulsatile mass, no hepatomegaly, no splenomegaly, obese , stria, mild diffuse erythema, distended abdomen with swelling and some subcutaneous edema EXT:  2 plus pulses throughout, no edema, no cyanosis no clubbing EXT:  No edema   ASSESSMENT AND PLAN  Peripartum cardiomyopathy - She 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  At this point, no change in therapy is indicated.  We have reviewed salt and fluid restrictions.  No further cardiovascular testing is indicated.  Hypertension - She reports a low blood pressure as above it seems to be normal. No change in therapy is indicated.  Obesity - The patient understands the need to lose weight with diet and exercise. We have discussed specific strategies for this.  ICD - She will continue follow up in ICD clinic.

## 2014-06-20 DIAGNOSIS — I5032 Chronic diastolic (congestive) heart failure: Secondary | ICD-10-CM | POA: Diagnosis not present

## 2014-06-20 DIAGNOSIS — E1165 Type 2 diabetes mellitus with hyperglycemia: Secondary | ICD-10-CM | POA: Diagnosis not present

## 2014-06-20 DIAGNOSIS — Z0000 Encounter for general adult medical examination without abnormal findings: Secondary | ICD-10-CM | POA: Diagnosis not present

## 2014-06-20 DIAGNOSIS — I10 Essential (primary) hypertension: Secondary | ICD-10-CM | POA: Diagnosis not present

## 2014-07-03 ENCOUNTER — Ambulatory Visit (INDEPENDENT_AMBULATORY_CARE_PROVIDER_SITE_OTHER): Payer: Medicare Other | Admitting: *Deleted

## 2014-07-03 DIAGNOSIS — Z95810 Presence of automatic (implantable) cardiac defibrillator: Secondary | ICD-10-CM | POA: Diagnosis not present

## 2014-07-03 DIAGNOSIS — O903 Peripartum cardiomyopathy: Secondary | ICD-10-CM

## 2014-07-05 ENCOUNTER — Telehealth: Payer: Self-pay | Admitting: *Deleted

## 2014-07-05 NOTE — Telephone Encounter (Signed)
ICM transmission received. I left a message for the patient to call at her home and cell #'s.

## 2014-07-06 NOTE — Telephone Encounter (Signed)
I attempted to call the patient. I left a message at her home and cell #'s that I will call her back tomorrow.

## 2014-07-07 ENCOUNTER — Encounter: Payer: Self-pay | Admitting: *Deleted

## 2014-07-07 NOTE — Progress Notes (Signed)
EPIC Encounter for ICM Monitoring  Patient Name: Erin Jarvis is a 49 y.o. female Date: 07/07/2014 Primary Care Physican: [name], MD Primary Cardiologist: Hochrein Electrophysiologist: Allred Dry Weight: 301 lbs       In the past month, have you:  1. Gained more than 2 pounds in a day or more than 5 pounds in a week? Uncertain- no scale to weigh on at home.   2. Had changes in your medications (with verification of current medications)? no  3. Had more shortness of breath than is usual for you? no  4. Limited your activity because of shortness of breath? no  5. Not been able to sleep because of shortness of breath? no  6. Had increased swelling in your feet or ankles? no  7. Had symptoms of dehydration (dizziness, dry mouth, increased thirst, decreased urine output) no  8. Had changes in sodium restriction? no  9. Been compliant with medication? Yes   ICM trend:   Follow-up plan: ICM clinic phone appointment: 08/10/14. The patient is continuing to have some abdominal swelling/ fullness post ablation of her fibroid tumor. This is improving. Impedence is progressing back toward baseline at this time. No changes made today as her symptoms are stable.  Copy of note sent to patient's primary care physician, primary cardiologist, and device following physician.  Alvis Lemmings, RN, BSN 07/07/2014 6:03 PM

## 2014-07-07 NOTE — Telephone Encounter (Signed)
I spoke with the patient. 

## 2014-08-03 ENCOUNTER — Telehealth: Payer: Self-pay | Admitting: Radiology

## 2014-08-03 DIAGNOSIS — E039 Hypothyroidism, unspecified: Secondary | ICD-10-CM | POA: Diagnosis not present

## 2014-08-03 DIAGNOSIS — Z6841 Body Mass Index (BMI) 40.0 and over, adult: Secondary | ICD-10-CM | POA: Diagnosis not present

## 2014-08-03 DIAGNOSIS — I5021 Acute systolic (congestive) heart failure: Secondary | ICD-10-CM | POA: Diagnosis not present

## 2014-08-03 DIAGNOSIS — O903 Peripartum cardiomyopathy: Secondary | ICD-10-CM | POA: Diagnosis not present

## 2014-08-03 DIAGNOSIS — Z9049 Acquired absence of other specified parts of digestive tract: Secondary | ICD-10-CM | POA: Diagnosis not present

## 2014-08-03 DIAGNOSIS — R1900 Intra-abdominal and pelvic swelling, mass and lump, unspecified site: Secondary | ICD-10-CM | POA: Diagnosis not present

## 2014-08-03 DIAGNOSIS — J90 Pleural effusion, not elsewhere classified: Secondary | ICD-10-CM | POA: Diagnosis not present

## 2014-08-03 DIAGNOSIS — D500 Iron deficiency anemia secondary to blood loss (chronic): Secondary | ICD-10-CM | POA: Diagnosis not present

## 2014-08-03 DIAGNOSIS — R601 Generalized edema: Secondary | ICD-10-CM | POA: Diagnosis not present

## 2014-08-03 DIAGNOSIS — Z95810 Presence of automatic (implantable) cardiac defibrillator: Secondary | ICD-10-CM | POA: Diagnosis not present

## 2014-08-03 DIAGNOSIS — R140 Abdominal distension (gaseous): Secondary | ICD-10-CM | POA: Diagnosis not present

## 2014-08-03 NOTE — Telephone Encounter (Signed)
Patient is 3 mos post [place].  Patient called w/ the following concerns occurring over the last 2-3 days:  swelling of legs, abdomen, buttocks, and lower back.  Also describes "weeping" from skin and itching.  Denies shortness of breath or chest pain.    Has recently been seen by both her primary care MD and her cardiologist.   Instructed patient to contact her primary care MD or cardiologist for follow up.  Patient states that she may decide to go to ED but that she will check with those offices first.  Reece Levy, RN 08/03/2014 3:22 PM

## 2014-08-04 DIAGNOSIS — F329 Major depressive disorder, single episode, unspecified: Secondary | ICD-10-CM | POA: Diagnosis present

## 2014-08-04 DIAGNOSIS — Z95810 Presence of automatic (implantable) cardiac defibrillator: Secondary | ICD-10-CM | POA: Diagnosis not present

## 2014-08-04 DIAGNOSIS — Z886 Allergy status to analgesic agent status: Secondary | ICD-10-CM | POA: Diagnosis not present

## 2014-08-04 DIAGNOSIS — Z825 Family history of asthma and other chronic lower respiratory diseases: Secondary | ICD-10-CM | POA: Diagnosis not present

## 2014-08-04 DIAGNOSIS — O903 Peripartum cardiomyopathy: Secondary | ICD-10-CM | POA: Diagnosis present

## 2014-08-04 DIAGNOSIS — I517 Cardiomegaly: Secondary | ICD-10-CM | POA: Diagnosis not present

## 2014-08-04 DIAGNOSIS — E119 Type 2 diabetes mellitus without complications: Secondary | ICD-10-CM | POA: Diagnosis present

## 2014-08-04 DIAGNOSIS — J90 Pleural effusion, not elsewhere classified: Secondary | ICD-10-CM | POA: Diagnosis not present

## 2014-08-04 DIAGNOSIS — Z79899 Other long term (current) drug therapy: Secondary | ICD-10-CM | POA: Diagnosis not present

## 2014-08-04 DIAGNOSIS — Z8489 Family history of other specified conditions: Secondary | ICD-10-CM | POA: Diagnosis not present

## 2014-08-04 DIAGNOSIS — I5021 Acute systolic (congestive) heart failure: Secondary | ICD-10-CM | POA: Diagnosis not present

## 2014-08-04 DIAGNOSIS — D500 Iron deficiency anemia secondary to blood loss (chronic): Secondary | ICD-10-CM | POA: Diagnosis not present

## 2014-08-04 DIAGNOSIS — Z6841 Body Mass Index (BMI) 40.0 and over, adult: Secondary | ICD-10-CM | POA: Diagnosis not present

## 2014-08-10 ENCOUNTER — Ambulatory Visit (INDEPENDENT_AMBULATORY_CARE_PROVIDER_SITE_OTHER): Payer: Medicare Other | Admitting: *Deleted

## 2014-08-10 DIAGNOSIS — Z95810 Presence of automatic (implantable) cardiac defibrillator: Secondary | ICD-10-CM

## 2014-08-10 DIAGNOSIS — O903 Peripartum cardiomyopathy: Secondary | ICD-10-CM | POA: Diagnosis not present

## 2014-08-10 DIAGNOSIS — I429 Cardiomyopathy, unspecified: Secondary | ICD-10-CM

## 2014-08-10 DIAGNOSIS — I428 Other cardiomyopathies: Secondary | ICD-10-CM

## 2014-08-10 NOTE — Progress Notes (Signed)
Remote ICD transmission.   

## 2014-08-11 ENCOUNTER — Encounter: Payer: Self-pay | Admitting: *Deleted

## 2014-08-11 LAB — CUP PACEART REMOTE DEVICE CHECK
Battery Voltage: 3.03 V
Date Time Interrogation Session: 20160512052307
HighPow Impedance: 50 Ohm
HighPow Impedance: 65 Ohm
Lead Channel Pacing Threshold Pulse Width: 0.4 ms
Lead Channel Sensing Intrinsic Amplitude: 4.5 mV
Lead Channel Sensing Intrinsic Amplitude: 4.5 mV
Lead Channel Setting Pacing Pulse Width: 0.4 ms
Lead Channel Setting Sensing Sensitivity: 0.3 mV
MDC IDC MSMT LEADCHNL RV IMPEDANCE VALUE: 418 Ohm
MDC IDC MSMT LEADCHNL RV PACING THRESHOLD AMPLITUDE: 1.5 V
MDC IDC SET LEADCHNL RV PACING AMPLITUDE: 3 V
MDC IDC SET ZONE DETECTION INTERVAL: 240 ms
MDC IDC SET ZONE DETECTION INTERVAL: 330 ms
MDC IDC STAT BRADY RV PERCENT PACED: 0.04 %
Zone Setting Detection Interval: 300 ms
Zone Setting Detection Interval: 360 ms

## 2014-08-11 NOTE — Addendum Note (Signed)
Addended byAlvis Lemmings C on: 08/11/2014 11:59 AM   Modules accepted: Medications

## 2014-08-11 NOTE — Progress Notes (Addendum)
EPIC Encounter for ICM Monitoring  Patient Name: Erin Jarvis is a 49 y.o. female Date: 08/11/2014 Primary Care Physican: [name], MD Primary Cardiologist: Hochrein  Electrophysiologist: Allred Dry Weight: 280 lbs       In the past month, have you:  1. Gained more than 2 pounds in a day or more than 5 pounds in a week? Yes. Her weight was up to 309 from around 300 lbs. She was hospitalized and diuresed 30 lbs. Her recent weights at home have been 280 lbs.   2. Had changes in your medications (with verification of current medications)? Yes. Her PCP put her on lasix 20 mg BID at discharge.  3. Had more shortness of breath than is usual for you? no  4. Limited your activity because of shortness of breath? no  5. Not been able to sleep because of shortness of breath? no  6. Had increased swelling in your feet or ankles? Yes. The patient had increased swelling to her legs and abdomen about the middle of last week. This was a new change as she had not been having any lower extremity swelling. She did go to the ER at [facility] and was hospitalized from Thursday 5/5- Monday 5/9. She reports that she was diuresed 30 lbs.   7. Had symptoms of dehydration (dizziness, dry mouth, increased thirst, decreased urine output) no  8. Had changes in sodium restriction? no  9. Been compliant with medication? Yes   ICM trend:   Follow-up plan: ICM clinic phone appointment: 09/14/14. As above, the patient was hospitalized from 5/5-5/9 for lower extremity and abdominal swelling. She states she was not SOB, but that the lower extremity swelling was new for her and was concerning. She was given IV lasix and diuresed 30 lbs. She has been weighing at home and is now running at 280 lbs. She has no follow up appointments pending with Dr. Percival Spanish. Will forward to Dr. Percival Spanish and Dr. Rayann Heman for review. I have advised her to call us should her weight go up 2-3 lbs in 24 hours or 5 lbs in one  week. She voices understanding.  Copy of note sent to patient's primary care physician, primary cardiologist, and device following physician.  Alvis Lemmings, RN, BSN 08/11/2014 11:29 AM

## 2014-08-15 DIAGNOSIS — E1165 Type 2 diabetes mellitus with hyperglycemia: Secondary | ICD-10-CM | POA: Diagnosis not present

## 2014-08-15 DIAGNOSIS — I10 Essential (primary) hypertension: Secondary | ICD-10-CM | POA: Diagnosis not present

## 2014-08-15 DIAGNOSIS — I5023 Acute on chronic systolic (congestive) heart failure: Secondary | ICD-10-CM | POA: Diagnosis not present

## 2014-08-17 ENCOUNTER — Telehealth: Payer: Self-pay | Admitting: *Deleted

## 2014-08-17 NOTE — Telephone Encounter (Signed)
F/u 3 mth [place] 05-15-14 lmom if pt has any questions or concerns to call us otherwise we will see her at 54mth f/u.

## 2014-08-23 ENCOUNTER — Encounter: Payer: Self-pay | Admitting: Cardiology

## 2014-08-30 ENCOUNTER — Telehealth: Payer: Self-pay | Admitting: Cardiology

## 2014-08-30 ENCOUNTER — Encounter: Payer: Self-pay | Admitting: Internal Medicine

## 2014-09-06 NOTE — Telephone Encounter (Signed)
Closed encounter °

## 2014-09-08 ENCOUNTER — Encounter: Payer: Self-pay | Admitting: Cardiology

## 2014-09-08 ENCOUNTER — Ambulatory Visit (INDEPENDENT_AMBULATORY_CARE_PROVIDER_SITE_OTHER): Payer: Medicare Other | Admitting: Cardiology

## 2014-09-08 VITALS — BP 114/82 | HR 87 | Ht 67.0 in | Wt 263.0 lb

## 2014-09-08 DIAGNOSIS — O903 Peripartum cardiomyopathy: Secondary | ICD-10-CM

## 2014-09-08 NOTE — Progress Notes (Signed)
HPI The patient has a history of a postpartum cardiomyopathy in [place]. Her cardiomyopathy was diagnosed in 1996 and she says her EF was about 20%. After treatment it went up to about 45%. While living in [place] she had sustained ventricular tachycardia requiring placement of an ICD.  A MUGA demonstrated an EF of about 35%.  Meds have been titrated and she returns for follow up.   Since I last saw her she said she was hospitalized at [facility]. She had some volume overload. She reports being diuresed and does say that her echo demonstrated an EF of 40-50% though I don't have these records. Since I last saw her she's lost a total of 45 pounds as she thinks her uterine fibroid is smaller. She's eating last and she's lost volume. She feels better. She's not having any excess shortness of breath. She's not having any PND or orthopnea. She had no palpitations, presyncope or syncope. She's not having any chest pressure.   Allergies  Allergen Reactions  . Morphine And Related Nausea Only and Other (See Comments)    Feels sick,and like she is going to pass out; can't breathe, starts sweating    Current Outpatient Prescriptions  Medication Sig Dispense Refill  . carvedilol (COREG) 25 MG tablet Take 1 tablet (25 mg total) by mouth 2 (two) times daily with a meal. 180 tablet 1  . Fish Oil-Cholecalciferol (FISH OIL + D3 PO) Take 1 capsule by mouth daily.    . furosemide (LASIX) 20 MG tablet Take 20 mg by mouth 2 (two) times daily.    Marland Kitchen lisinopril (PRINIVIL,ZESTRIL) 20 MG tablet Take 1 tablet (20 mg total) by mouth 2 (two) times daily. 180 tablet 3  . metFORMIN (GLUCOPHAGE) 1000 MG tablet Take 1,000 mg by mouth 2 (two) times daily with a meal.    . MILK THISTLE PO Take 1 capsule by mouth daily.    . Multiple Vitamin (MULTIVITAMIN) tablet Take 1 tablet by mouth daily.    Marland Kitchen spironolactone (ALDACTONE) 25 MG tablet TAKE ONE TABLET EACH DAY 90 tablet 1   No current  facility-administered medications for this visit.    Past Medical History  Diagnosis Date  . Postpartum cardiomyopathy     Last echocardiogram was last year and EF 45 -50 %  . Hypertensive cardiovascular disease   . Seasonal allergies   . Ventricular tachycardia     s/p ICD Implantation (MDT Secura VR)  . Sleep apnea     complaint with CPAP  . Obesity   . Diabetes mellitus without complication   . Hypertension   . AICD (automatic cardioverter/defibrillator) present 2002, 2010    secondary to post-partum cardiomyopathy (1996)  . CHF (congestive heart failure)     Past Surgical History  Procedure Laterality Date  . Cardiac defibrillator placement  08/12/2000, 11/11/2008    Most recent Generator MDT Secura VR placed in [place]  . Cesarean section  1996  . Cholecystectomy  2004    ROS:  As stated in the HPI and negative for all other systems.   PHYSICAL EXAM BP 114/82 mmHg  Pulse 87  Ht 5\' 7"  (1.702 m)  Wt 263 lb (119.296 kg)  BMI 41.18 kg/m2 GENERAL:  Well appearing HEENT:  Pupils equal round and reactive, fundi not visualized, oral mucosa unremarkable NECK:  No jugular venous distention, waveform within normal limits, carotid upstroke brisk and symmetric, no bruits, no thyromegaly LUNGS:  Clear to auscultation bilaterally CHEST:  Well healed  ICD pocket HEART:  PMI not displaced or sustained,S1 and S2 within normal limits, no S3, no S4, no clicks, no rubs, no murmurs ABD:  Flat, positive bowel sounds normal in frequency in pitch, no bruits, no rebound, no guarding, no midline pulsatile mass, no hepatomegaly, no splenomegaly, obese , stria, mild diffuse erythema, distended abdomen with swelling and some subcutaneous edema EXT:  2 plus pulses throughout, no edema, no cyanosis no clubbing EXT:  No edema   ASSESSMENT AND PLAN  Peripartum cardiomyopathy - She 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  At this point, no change in therapy is indicated. No further cardiovascular testing is  indicated.  I will review the echo from  [facility] when this is available.   Hypertension - The blood pressure is at target. No change in medications is indicated. We will continue with therapeutic lifestyle changes (TLC).  Obesity - I encourage more and congratulated her on what she's been able to do. weight loss   ICD - She will continue follow up in ICD clinic.

## 2014-09-08 NOTE — Patient Instructions (Signed)
Your physician wants you to follow-up in: 1 Year. You will receive a reminder letter in the mail two months in advance. If you don't receive a letter, please call our office to schedule the follow-up appointment.  

## 2014-09-13 ENCOUNTER — Ambulatory Visit: Payer: Medicare Other | Admitting: Cardiology

## 2014-09-14 ENCOUNTER — Telehealth: Payer: Self-pay | Admitting: *Deleted

## 2014-09-14 ENCOUNTER — Ambulatory Visit (INDEPENDENT_AMBULATORY_CARE_PROVIDER_SITE_OTHER): Payer: Medicare Other | Admitting: *Deleted

## 2014-09-14 DIAGNOSIS — Z95810 Presence of automatic (implantable) cardiac defibrillator: Secondary | ICD-10-CM | POA: Diagnosis not present

## 2014-09-14 DIAGNOSIS — O903 Peripartum cardiomyopathy: Secondary | ICD-10-CM | POA: Diagnosis not present

## 2014-09-14 NOTE — Telephone Encounter (Signed)
I spoke with the patient. 

## 2014-09-14 NOTE — Progress Notes (Signed)
EPIC Encounter for ICM Monitoring  Patient Name: Erin Jarvis is a 49 y.o. female Date: 09/14/2014 Primary Care Physican: [name], MD Primary Cardiologist: Hochrein Electrophysiologist: Allred Dry Weight: 263 lbs       In the past month, have you:  1. Gained more than 2 pounds in a day or more than 5 pounds in a week? No. Her weight continues to gradually decline. She was 280 lbs on 08/09/14. She is currently 263 lbs.   2. Had changes in your medications (with verification of current medications)? no  3. Had more shortness of breath than is usual for you? no  4. Limited your activity because of shortness of breath? no  5. Not been able to sleep because of shortness of breath? no  6. Had increased swelling in your feet or ankles? no  7. Had symptoms of dehydration (dizziness, dry mouth, increased thirst, decreased urine output) no  8. Had changes in sodium restriction? no  9. Been compliant with medication? Yes   ICM trend:   Follow-up plan: ICM clinic phone appointment: 09/28/14. The patient reports no change in her symptoms today. She has been following her weight very closely and trying to watch her salt. She did have some slight elevation in her optivol readings over the last few weeks, but impedence is at baseline as of today. No changes made. I will recheck her again in 2 weeks.   Copy of note sent to patient's primary care physician, primary cardiologist, and device following physician.  Alvis Lemmings, RN, BSN 09/14/2014 4:29 PM

## 2014-09-14 NOTE — Telephone Encounter (Signed)
ICM transmission received. I left a message at her home and cell #'s to call.

## 2014-09-28 ENCOUNTER — Encounter: Payer: Self-pay | Admitting: *Deleted

## 2014-09-28 ENCOUNTER — Telehealth: Payer: Self-pay | Admitting: *Deleted

## 2014-09-28 ENCOUNTER — Ambulatory Visit (INDEPENDENT_AMBULATORY_CARE_PROVIDER_SITE_OTHER): Payer: Medicare Other | Admitting: *Deleted

## 2014-09-28 DIAGNOSIS — Z95810 Presence of automatic (implantable) cardiac defibrillator: Secondary | ICD-10-CM

## 2014-09-28 DIAGNOSIS — I509 Heart failure, unspecified: Secondary | ICD-10-CM

## 2014-09-28 NOTE — Progress Notes (Signed)
EPIC Encounter for ICM Monitoring  Patient Name: Erin Jarvis is a 49 y.o. female Date: 09/28/2014 Primary Care Physican: [name], MD Primary Cardiologist: Hochrein Electrophysiologist: Allred Dry Weight: 254 lbs       In the past month, have you:  1. Gained more than 2 pounds in a day or more than 5 pounds in a week? no  2. Had changes in your medications (with verification of current medications)? no  3. Had more shortness of breath than is usual for you? no  4. Limited your activity because of shortness of breath? no  5. Not been able to sleep because of shortness of breath? no  6. Had increased swelling in your feet or ankles? no  7. Had symptoms of dehydration (dizziness, dry mouth, increased thirst, decreased urine output) no  8. Had changes in sodium restriction? no  9. Been compliant with medication? Yes   ICM trend:   Follow-up plan: ICM clinic phone appointment: 10/30/14. 2 week follow up- the patient is doing well today. Her weight is continuing to trend down. Her only complaint for about a week is some intermittent visual disturbances that she describes as "feeling like i'm leaving a dark building and walking out side to the light, looks like it snowing." She inquired if any of her medications would cause this for her. I advised I did not think her cardiac drugs would cause the visual disturbance for her, but I would forward to Dr. Allred/ Dr. Percival Spanish for their recommendations. She stated she may go see an eye doctor, and I advised that would be good to do. She voices understanding.   Copy of note sent to patient's primary care physician, primary cardiologist, and device following physician.  Alvis Lemmings, RN, BSN 09/28/2014 2:16 PM

## 2014-09-28 NOTE — Telephone Encounter (Signed)
ICM transmission received. I left a message for the patient to call at her home #. Attempted to reach her at the cell #- no answer/ no voice mail.

## 2014-09-28 NOTE — Telephone Encounter (Signed)
I spoke with the patient. 

## 2014-10-30 ENCOUNTER — Ambulatory Visit (INDEPENDENT_AMBULATORY_CARE_PROVIDER_SITE_OTHER): Payer: Medicare Other | Admitting: *Deleted

## 2014-10-30 DIAGNOSIS — I509 Heart failure, unspecified: Secondary | ICD-10-CM | POA: Diagnosis not present

## 2014-10-30 DIAGNOSIS — Z95810 Presence of automatic (implantable) cardiac defibrillator: Secondary | ICD-10-CM | POA: Diagnosis not present

## 2014-11-02 ENCOUNTER — Telehealth: Payer: Self-pay | Admitting: *Deleted

## 2014-11-02 NOTE — Telephone Encounter (Signed)
ICM transmission received. I left a message at the patient's home & cell #'s to call.

## 2014-11-03 ENCOUNTER — Encounter: Payer: Self-pay | Admitting: *Deleted

## 2014-11-03 NOTE — Telephone Encounter (Signed)
I spoke with the patient. 

## 2014-11-03 NOTE — Progress Notes (Signed)
EPIC Encounter for ICM Monitoring  Patient Name: Erin Jarvis is a 49 y.o. female Date: 11/03/2014 Primary Care Physican: [name], MD Primary Cardiologist: Hochrein Electrophysiologist: Allred Dry Weight: 250 lbs       In the past month, have you:  1. Gained more than 2 pounds in a day or more than 5 pounds in a week? no  2. Had changes in your medications (with verification of current medications)? Yes. The patient reports today that she has been having some low BP readings (~90/40-50). She has been holding her spironolactone due to this. She also tells me today that she was taking coreg 25 mg BID, but she has just realized in the last few days that when she got this refilled, it was for coreg 3.125 mg BID (refilled by Dr. Sherrie Sport). She has been taking this dose since ~ 10/17/14.   3. Had more shortness of breath than is usual for you? no  4. Limited your activity because of shortness of breath? no  5. Not been able to sleep because of shortness of breath? no  6. Had increased swelling in your feet or ankles? no  7. Had symptoms of dehydration (dizziness, dry mouth, increased thirst, decreased urine output) Occasional dizziness with low BP readings. She has been exercising more and trying to stay hydrated.  8. Had changes in sodium restriction? no  9. Been compliant with medication? Yes   ICM trend:   Follow-up plan: ICM clinic phone appointment: 12/06/14. The patient's optivol trends are showing she may a little dry from ~ mid June to present. She does have occasional dizziness with BP readings in the 90/40-50 range. She is holding spironolactone on her own. Her coreg dose was called by Dr. Sherrie Sport at a lower dose- 3.125mg  BID. She was on Coreg 25 mg BID. The reason for the change is unclear. I advised the patient I would forward to Dr. Percival Spanish to review and make a decision on her medications. She is on lisinopril and lasix that could potentially be decreased in light of  her impedence being above baseline and since her BP is running low. Will clarify if coreg dose needs to be increased again.  Copy of note sent to patient's primary care physician, primary cardiologist, and device following physician.  Alvis Lemmings, RN, BSN 11/03/2014 5:42 PM

## 2014-11-03 NOTE — Telephone Encounter (Signed)
I left a message at the patient's home/ cell #'s to call.

## 2014-11-10 ENCOUNTER — Telehealth: Payer: Self-pay | Admitting: Cardiology

## 2014-11-10 NOTE — Telephone Encounter (Signed)
I spoke with the patient and she is aware of Dr. Rosezella Florida recommendations. She will decrease lasix to once daily and continue her other medications as they are.

## 2014-11-10 NOTE — Telephone Encounter (Signed)
Erin Jarvis, she could reduce her Lasix to once daily with prn twice daily. I would leave the other meds where they are unless she is having significant presyncope. Thanks.      ----- Message -----     From: Emily Filbert, RN     Sent: 11/03/2014  5:57 PM      To: Minus Breeding, MD    These recommendations were received from Dr. Percival Spanish from my last ICM encounter with the patient.   I have left a message for her to call at her home and cell #'s.

## 2014-11-13 NOTE — Progress Notes (Signed)
I heard back from him. Already addressed!! Thanks!

## 2014-11-14 DIAGNOSIS — I5023 Acute on chronic systolic (congestive) heart failure: Secondary | ICD-10-CM | POA: Diagnosis not present

## 2014-11-14 DIAGNOSIS — E1129 Type 2 diabetes mellitus with other diabetic kidney complication: Secondary | ICD-10-CM | POA: Diagnosis not present

## 2014-11-14 DIAGNOSIS — R5383 Other fatigue: Secondary | ICD-10-CM | POA: Diagnosis not present

## 2014-11-14 DIAGNOSIS — I10 Essential (primary) hypertension: Secondary | ICD-10-CM | POA: Diagnosis not present

## 2014-11-14 DIAGNOSIS — D508 Other iron deficiency anemias: Secondary | ICD-10-CM | POA: Diagnosis not present

## 2014-11-14 DIAGNOSIS — E559 Vitamin D deficiency, unspecified: Secondary | ICD-10-CM | POA: Diagnosis not present

## 2014-11-23 ENCOUNTER — Telehealth: Payer: Self-pay | Admitting: Radiology

## 2014-11-23 NOTE — Telephone Encounter (Signed)
Left message on M# voice mail requesting patient call to schedule 6 mo follow up app't.  Arvin Abello Riki Rusk, RN 11/23/2014 11:17 AM

## 2014-11-28 ENCOUNTER — Other Ambulatory Visit (HOSPITAL_COMMUNITY): Payer: Self-pay | Admitting: Interventional Radiology

## 2014-11-28 DIAGNOSIS — D259 Leiomyoma of uterus, unspecified: Secondary | ICD-10-CM

## 2014-12-06 ENCOUNTER — Ambulatory Visit (INDEPENDENT_AMBULATORY_CARE_PROVIDER_SITE_OTHER): Payer: Medicare Other

## 2014-12-06 ENCOUNTER — Telehealth: Payer: Self-pay

## 2014-12-06 DIAGNOSIS — Z95810 Presence of automatic (implantable) cardiac defibrillator: Secondary | ICD-10-CM | POA: Diagnosis not present

## 2014-12-06 DIAGNOSIS — I509 Heart failure, unspecified: Secondary | ICD-10-CM | POA: Diagnosis not present

## 2014-12-06 NOTE — Progress Notes (Signed)
EPIC Encounter for ICM Monitoring  Patient Name: Erin Jarvis is a 49 y.o. female Date: 12/06/2014 Primary Care Physican: [name], MD Primary Cardiologist: Hochrein Electrophysiologist: Allred Dry Weight: 249 lb       In the past month, have you:  1. Gained more than 2 pounds in a day or more than 5 pounds in a week? no  2. Had changes in your medications (with verification of current medications)? Yes, Coreg dosage changed to 6.25mg  bid.   3. Had more shortness of breath than is usual for you? no  4. Limited your activity because of shortness of breath? no  5. Not been able to sleep because of shortness of breath? no  6. Had increased swelling in your feet or ankles? no  7. Had symptoms of dehydration (dizziness, dry mouth, increased thirst, decreased urine output) no  8. Had changes in sodium restriction? no  9. Been compliant with medication? Yes   ICM trend:   Follow-up plan: ICM clinic phone appointment 02/13/2015 and office visit with Dr Rayann Heman 01/12/2015 for device check.   No changes today.  Optivol impedance above baseline.  Patient stated she has been going to the gym.  Encouraged her to stay well hydrated while exercising.   Copy of note sent to patient's primary care physician, primary cardiologist, and device following physician.  [name], RN, CCM 12/06/2014 3:21 PM

## 2014-12-06 NOTE — Telephone Encounter (Signed)
ICM transmission received.  Left message for patient to return call.  

## 2014-12-06 NOTE — Telephone Encounter (Signed)
Spoke to patient

## 2015-01-05 ENCOUNTER — Ambulatory Visit (INDEPENDENT_AMBULATORY_CARE_PROVIDER_SITE_OTHER): Payer: Medicare Other | Admitting: Internal Medicine

## 2015-01-05 ENCOUNTER — Encounter: Payer: Self-pay | Admitting: Internal Medicine

## 2015-01-05 ENCOUNTER — Encounter: Payer: Medicare Other | Admitting: Internal Medicine

## 2015-01-05 VITALS — BP 130/82 | HR 87 | Ht 67.0 in | Wt 247.8 lb

## 2015-01-05 DIAGNOSIS — I472 Ventricular tachycardia, unspecified: Secondary | ICD-10-CM

## 2015-01-05 DIAGNOSIS — O903 Peripartum cardiomyopathy: Secondary | ICD-10-CM

## 2015-01-05 DIAGNOSIS — Z95810 Presence of automatic (implantable) cardiac defibrillator: Secondary | ICD-10-CM | POA: Diagnosis not present

## 2015-01-05 LAB — CUP PACEART INCLINIC DEVICE CHECK
HIGH POWER IMPEDANCE MEASURED VALUE: 72 Ohm
HighPow Impedance: 56 Ohm
Lead Channel Impedance Value: 437 Ohm
Lead Channel Pacing Threshold Amplitude: 1.5 V
Lead Channel Pacing Threshold Pulse Width: 0.4 ms
Lead Channel Sensing Intrinsic Amplitude: 4.5 mV
Lead Channel Setting Pacing Amplitude: 3 V
Lead Channel Setting Sensing Sensitivity: 0.3 mV
MDC IDC MSMT BATTERY VOLTAGE: 3.01 V
MDC IDC MSMT LEADCHNL RV SENSING INTR AMPL: 4.5 mV
MDC IDC SESS DTM: 20161007120808
MDC IDC SET LEADCHNL RV PACING PULSEWIDTH: 0.4 ms
MDC IDC STAT BRADY RV PERCENT PACED: 0.01 %
Zone Setting Detection Interval: 240 ms
Zone Setting Detection Interval: 300 ms
Zone Setting Detection Interval: 330 ms
Zone Setting Detection Interval: 360 ms

## 2015-01-05 NOTE — Patient Instructions (Addendum)
Your physician recommends that you continue on your current medications as directed. Please refer to the Current Medication list given to you today. Device check 04/09/15. Your physician recommends that you schedule a follow-up appointment in:  1 year with Dr. Rayann Heman. You will receive a reminder letter in the mail in about 10 months reminding you to call and schedule your appointment. If you don't receive this letter, please contact our office. You may also schedule this appointment today.

## 2015-01-05 NOTE — Progress Notes (Signed)
PCP: [name], MD Primary Cardiologist: Erin Jarvis is a 49 y.o. female who presents today for routine electrophysiology followup.  Since last being seen in our clinic, the patient reports doing very well.   She exercises 3 days per week and continues to work on weight reduction strategies. Today, she denies symptoms of palpitations, chest pain, shortness of breath,  lower extremity edema, dizziness, presyncope, syncope, or ICD shocks.  The patient is otherwise without complaint today.   Past Medical History  Diagnosis Date  . Postpartum cardiomyopathy     Last echocardiogram was last year and EF 45 -50 %  . Hypertensive cardiovascular disease   . Seasonal allergies   . Ventricular tachycardia ([place])     s/p ICD Implantation (MDT [employer]ura VR)  . Sleep apnea     complaint with CPAP  . Obesity   . Diabetes mellitus without complication ([place])   . Hypertension   . AICD (automatic cardioverter/defibrillator) present 2002, 2010    secondary to post-partum cardiomyopathy (1996)  . CHF (congestive heart failure) [facility])    Past Surgical History  Procedure Laterality Date  . Cardiac defibrillator placement  08/12/2000, 11/11/2008    Most recent Generator MDT [employer]ura VR placed in [place]  . Cesarean section  1996  . Cholecystectomy  2004    Current Outpatient Prescriptions  Medication Sig Dispense Refill  . carvedilol (COREG) 6.25 MG tablet Take 6.25 mg by mouth 2 (two) times daily.    . Fish Oil-Cholecalciferol (FISH OIL + D3 PO) Take 1 capsule by mouth daily.    . furosemide (LASIX) 20 MG tablet Take one tablet (20 mg) by mouth once daily- may take an additional dose daily as needed for swelling/ weight gain    . lisinopril (PRINIVIL,ZESTRIL) 20 MG tablet Take 1 tablet (20 mg total) by mouth 2 (two) times daily. 180 tablet 3  . metFORMIN (GLUCOPHAGE) 1000 MG tablet Take 1,000 mg by mouth 2 (two) times daily with a meal.    . MILK THISTLE PO Take 1 capsule by mouth  daily.    . Multiple Vitamin (MULTIVITAMIN) tablet Take 1 tablet by mouth daily.    Marland Kitchen spironolactone (ALDACTONE) 25 MG tablet TAKE ONE TABLET EACH DAY 90 tablet 1   No current facility-administered medications for this visit.    Physical Exam: Filed Vitals:   01/05/15 1023  BP: 130/82  Pulse: 87  Height: 5\' 7"  (1.702 m)  Weight: 247 lb 12.8 oz (112.401 kg)  SpO2: 100%    GEN- The patient is overweight appearing, alert and oriented x 3 today.   Head- normocephalic, atraumatic Eyes-  Sclera clear, conjunctiva pink Ears- hearing intact Oropharynx- clear Lungs- Clear to ausculation bilaterally, normal work of breathing Chest- ICD pocket is well healed Heart- Regular rate and rhythm, no murmurs, rubs or gallops, PMI not laterally displaced GI- soft, NT, ND, + BS Extremities- no clubbing, cyanosis, or edema  ICD interrogation- reviewed in detail today,  See PACEART report  Assessment and Plan:  1. Postpartum cardiomyopathy  clnically stable Followed in [facility] clinic   2. Ventricular tachycardia  Normal ICD function today See Pace Art report  3. OSA Clinically improved with CPAP  4. Obesity Body mass index is 38.8 kg/(m^2). Weight loss was discussed at length today.  She will make efforts to change her lifestyle/ reduce weight  [facility] every 3 months, ICM monthly Return in 1year to see me in the [place] device clinic She will follow-up with  Dr Percival Spanish in [place] as scheduled    Thompson Grayer MD, [facility] 01/05/2015 10:57 AM

## 2015-01-12 ENCOUNTER — Encounter: Payer: Medicare Other | Admitting: Internal Medicine

## 2015-01-15 DIAGNOSIS — Z87891 Personal history of nicotine dependence: Secondary | ICD-10-CM | POA: Diagnosis not present

## 2015-01-15 DIAGNOSIS — Z885 Allergy status to narcotic agent status: Secondary | ICD-10-CM | POA: Diagnosis not present

## 2015-01-15 DIAGNOSIS — L03116 Cellulitis of left lower limb: Secondary | ICD-10-CM | POA: Diagnosis not present

## 2015-01-15 DIAGNOSIS — M1000 Idiopathic gout, unspecified site: Secondary | ICD-10-CM | POA: Diagnosis not present

## 2015-01-15 DIAGNOSIS — M79671 Pain in right foot: Secondary | ICD-10-CM | POA: Diagnosis not present

## 2015-01-15 DIAGNOSIS — M109 Gout, unspecified: Secondary | ICD-10-CM | POA: Diagnosis not present

## 2015-01-22 ENCOUNTER — Encounter: Payer: Self-pay | Admitting: Internal Medicine

## 2015-02-13 ENCOUNTER — Telehealth: Payer: Self-pay

## 2015-02-13 NOTE — Telephone Encounter (Signed)
ICM transmission received.  Attempted patient call and left message for return call.  

## 2015-02-16 DIAGNOSIS — E1129 Type 2 diabetes mellitus with other diabetic kidney complication: Secondary | ICD-10-CM | POA: Diagnosis not present

## 2015-02-16 DIAGNOSIS — I10 Essential (primary) hypertension: Secondary | ICD-10-CM | POA: Diagnosis not present

## 2015-02-16 DIAGNOSIS — I5022 Chronic systolic (congestive) heart failure: Secondary | ICD-10-CM | POA: Diagnosis not present

## 2015-02-16 NOTE — Telephone Encounter (Signed)
Attempted ICM call and no message.  Next remote scheduled for 03/06/2015.  Optivol thoracic impedance below baseline ~01/07/2015 to 01/24/2015 and ~01/30/2015 to 02/13/2015 which may suggest fluid retention.  Patient letter sent with new transmission date and to advise to call should she be experiencing any HF symptoms.

## 2015-02-28 DIAGNOSIS — M109 Gout, unspecified: Secondary | ICD-10-CM | POA: Diagnosis not present

## 2015-02-28 DIAGNOSIS — R609 Edema, unspecified: Secondary | ICD-10-CM | POA: Diagnosis not present

## 2015-02-28 DIAGNOSIS — R52 Pain, unspecified: Secondary | ICD-10-CM | POA: Diagnosis not present

## 2015-02-28 DIAGNOSIS — M10071 Idiopathic gout, right ankle and foot: Secondary | ICD-10-CM | POA: Diagnosis not present

## 2015-02-28 DIAGNOSIS — R601 Generalized edema: Secondary | ICD-10-CM | POA: Diagnosis not present

## 2015-02-28 DIAGNOSIS — M79672 Pain in left foot: Secondary | ICD-10-CM | POA: Diagnosis not present

## 2015-02-28 DIAGNOSIS — L539 Erythematous condition, unspecified: Secondary | ICD-10-CM | POA: Diagnosis not present

## 2015-03-06 ENCOUNTER — Ambulatory Visit (INDEPENDENT_AMBULATORY_CARE_PROVIDER_SITE_OTHER): Payer: Medicare Other

## 2015-03-06 DIAGNOSIS — Z95810 Presence of automatic (implantable) cardiac defibrillator: Secondary | ICD-10-CM

## 2015-03-06 DIAGNOSIS — I509 Heart failure, unspecified: Secondary | ICD-10-CM

## 2015-03-06 NOTE — Progress Notes (Signed)
EPIC Encounter for ICM Monitoring  Patient Name: Erin Jarvis is a 49 y.o. female Date: 03/06/2015 Primary Care Physican: [name], MD Primary Cardiologist: Hochrein Electrophysiologist: Allred Dry Weight: Unknown, scale not working       In the past month, have you:  1. Gained more than 2 pounds in a day or more than 5 pounds in a week? unknown  2. Had changes in your medications (with verification of current medications)? no  3. Had more shortness of breath than is usual for you? no  4. Limited your activity because of shortness of breath? no  5. Not been able to sleep because of shortness of breath? no  6. Had increased swelling in your feet or ankles? no  7. Had symptoms of dehydration (dizziness, dry mouth, increased thirst, decreased urine output) no  8. Had changes in sodium restriction? no  9. Been compliant with medication? Yes   ICM trend:   Follow-up plan: ICM clinic phone appointment 04/09/2015.  Optivol thoracic impedance below baseline ~ 01/30/2015 to 02/16/2015 suggesting fluid retention.  She reported no HF symptoms but has had a bad case of Gout.  She was taking Prednisone during in the last couple of weeks.  She reported Gout is almost resolved.  She stated she has been eating more high sodium foods due to holiday.  Encouraged her to resume low sodium diet to help decrease fluid retention.  No changes today.   Copy of note sent to patient's primary care physician, primary cardiologist, and device following physician.  [name], RN, CCM 03/06/2015 2:17 PM

## 2015-03-16 DIAGNOSIS — M76821 Posterior tibial tendinitis, right leg: Secondary | ICD-10-CM | POA: Diagnosis not present

## 2015-03-23 IMAGING — XA IR US GUIDE VASC ACCESS RIGHT
12 series · 13 of 24 positions shown · non-contrast
Comparison: none

CLINICAL DATA: Symptomatic uterine fibroids, abnormal menstrual
bleeding

[Series 2: body 4 · 1 of 19 frames shown (1 of 11)]
[frame 3/19]
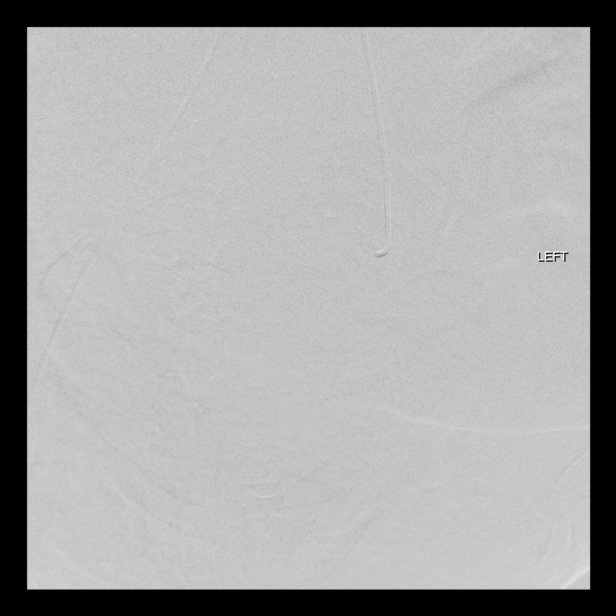

[Series 3: body 4 · 1 of 34 frames shown (2 of 11)]
[frame 6/34]
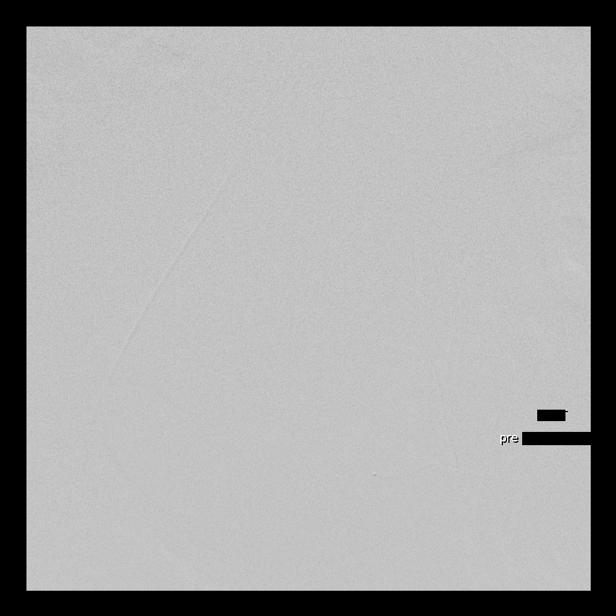

[Series 4: body 4 · 2 of 33 frames shown (3 of 11)]
[frame 5/33]
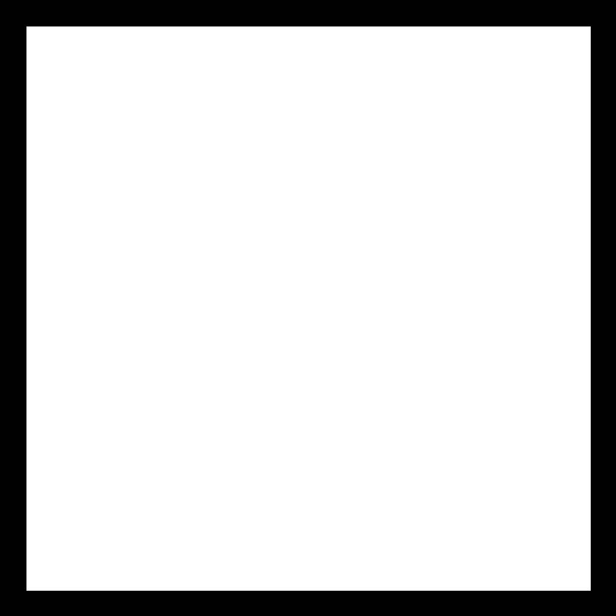
[frame 29/33]
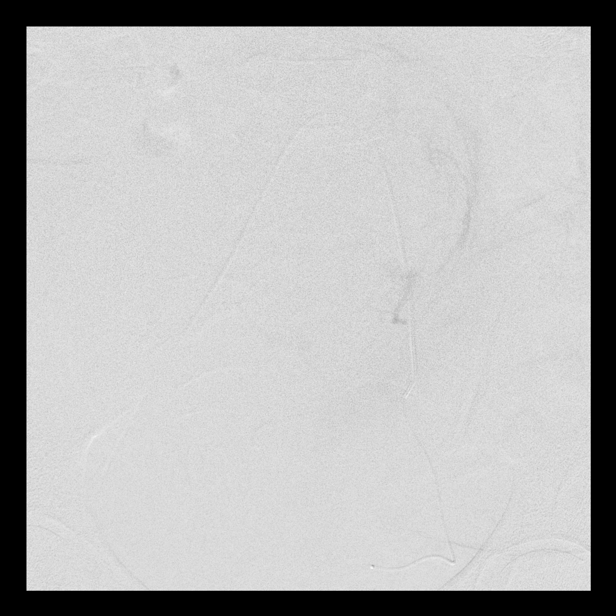

[Series 5: body 4 · 1 of 39 frames shown (4 of 11)]
[frame 34/39]
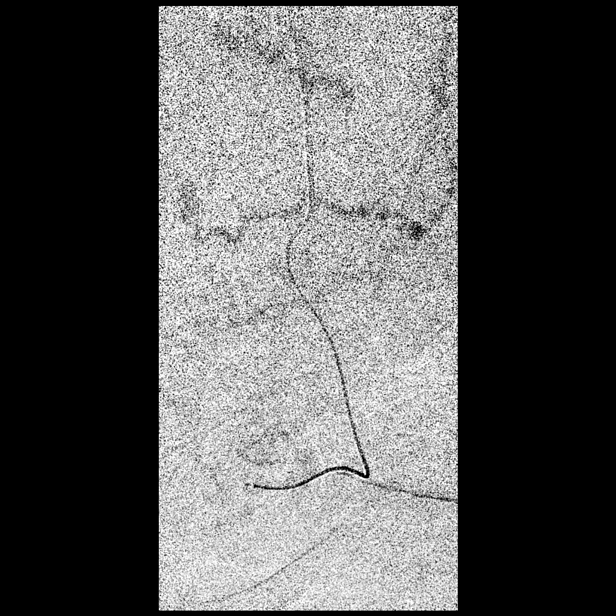

[Series 6: body 4 · 1 of 44 frames shown (5 of 11)]
[frame 38/44]
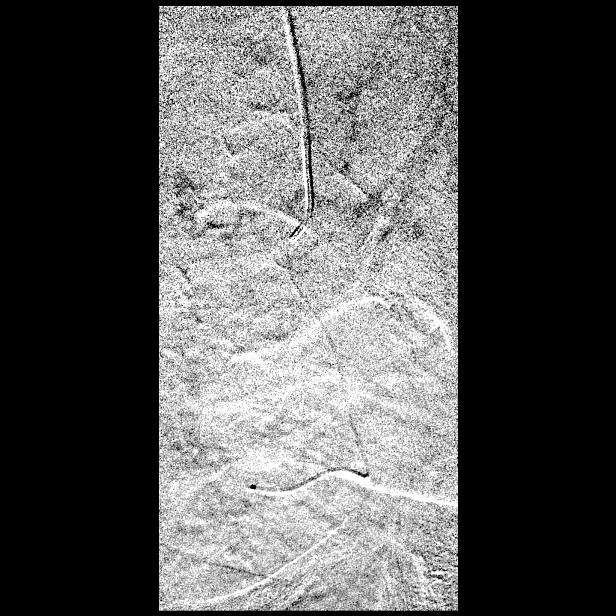

[Series 7: body 4 · 1 of 45 frames shown (6 of 11)]
[frame 39/45]
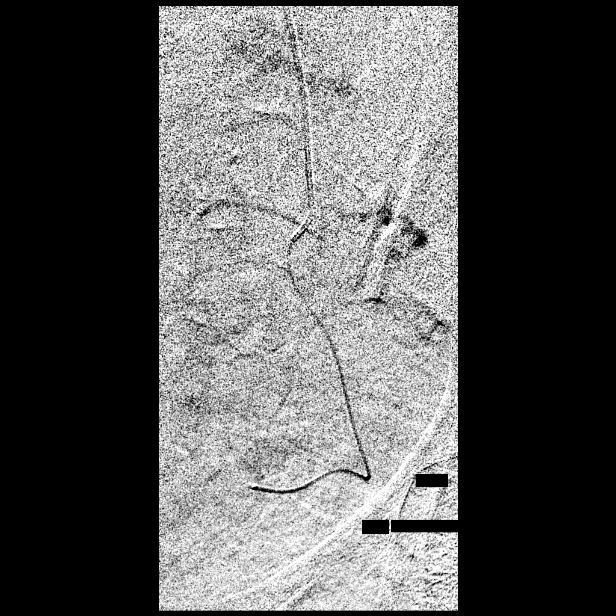

[Series 9: body 4 · 1 of 20 frames shown (7 of 11)]
[frame 11/20]
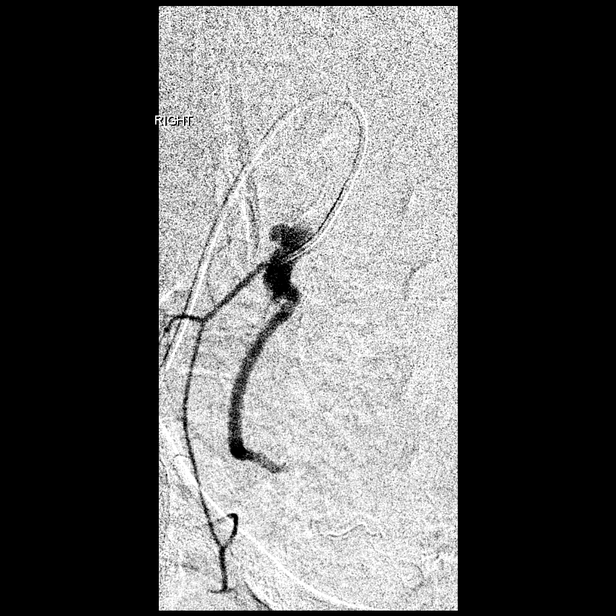

[Series 10: body 4 · 1 of 38 frames shown (8 of 11)]
[frame 33/38]
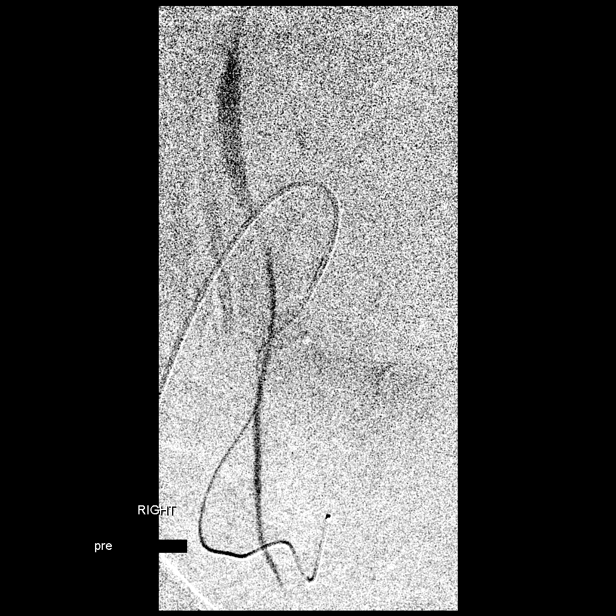

[Series 11: body 4 · 1 of 34 frames shown (9 of 11)]
[frame 18/34]
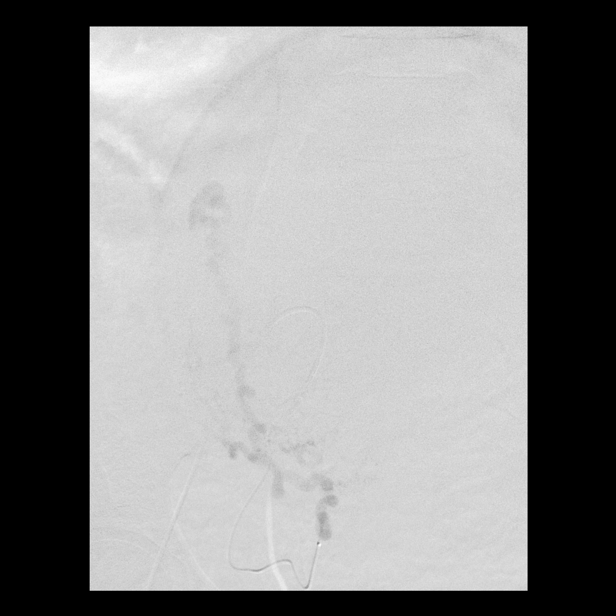

[Series 13: body 4 · 1 of 52 frames shown (10 of 11)]
[frame 27/52]
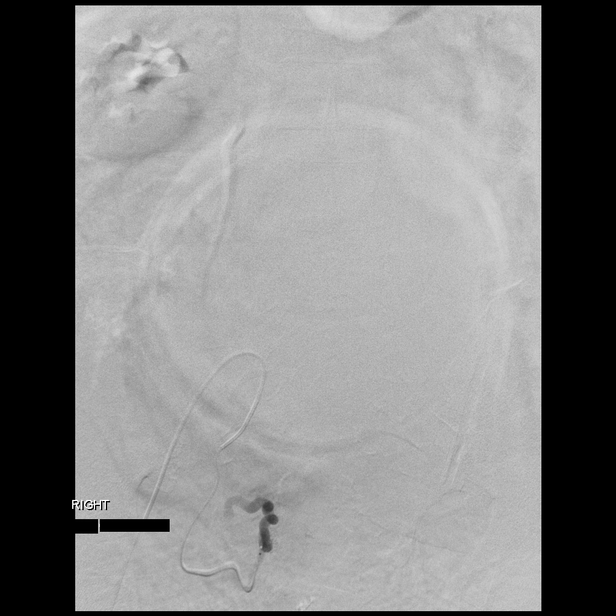

[Series 14: body 4 · 1 of 27 frames shown (11 of 11)]
[frame 5/27]
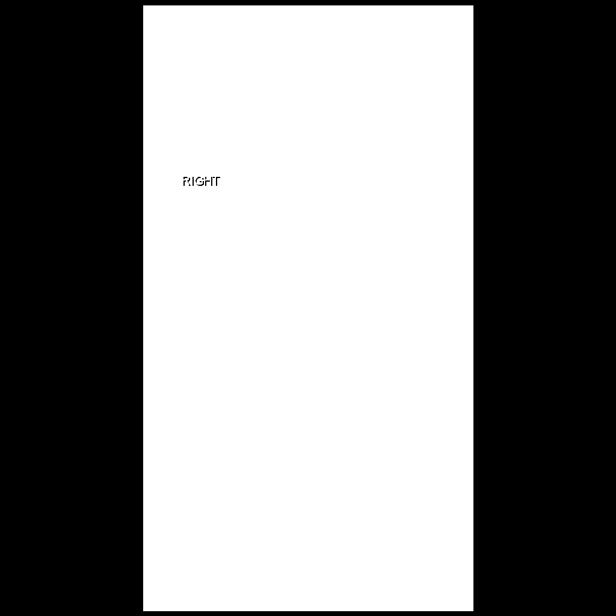

[Series 300: ir embo tumor organ ischemia infarct inc · 1 of 2 slices shown]
[im 2/2]
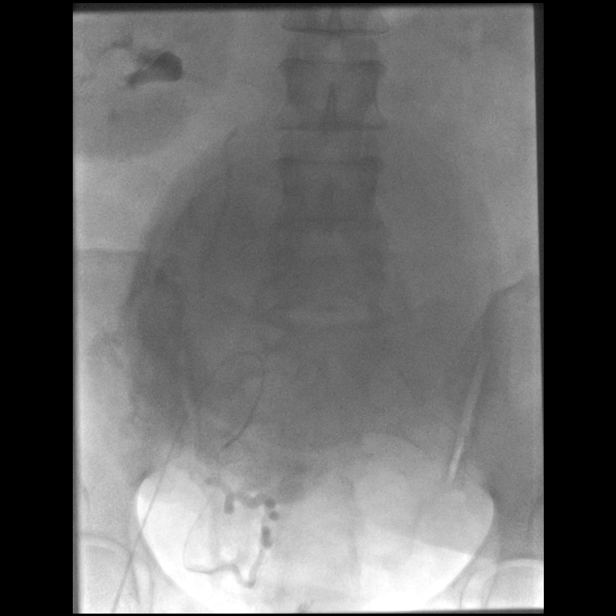

[13 of 24 positions shown; findings below may reference images not displayed]

EXAM:
ULTRASOUND GUIDANCE FOR VASCULAR ACCESS

BILATERAL UTERINE ARTERY CATHETERIZATIONS, ANGIOGRAMS AND
EMBOLIZATIONS.

Date:

Radiologist:  Larin, Leron

Guidance:  Ultrasound and fluoroscopic

FLUOROSCOPY TIME:  17 minutes 6 seconds

MEDICATIONS AND MEDICAL HISTORY:
3 g Ancef administered within 1 hour of the procedure, 2 mg Versed,
100 mcg fentanyl, 30 mg Toradol, 4 mg of Zofran

ANESTHESIA/SEDATION:
1 hour 25 minutes

CONTRAST:  180 cc Omnipaque 300

COMPLICATIONS:
None immediate

PROCEDURE:
Informed consent was obtained from the patient following explanation
of the procedure, risks, benefits and alternatives. The patient
understands, agrees and consents for the procedure. All questions
were addressed. A time out was performed.

Maximal barrier sterile technique utilized including caps, mask,
sterile gowns, sterile gloves, large sterile drape, hand hygiene,
and Betadine.

Under sterile conditions and local anesthesia, ultrasound
micropuncture access was performed of the right common femoral
artery. Images obtained for documentation. A guidewire was inserted
followed by 5 French sheath. A 5 French C2 catheter was utilized to
select the contralateral left internal iliac artery. Selective left
internal iliac angiogram performed. The enlarged tortuous left
uterine artery was localized. Micro catheter access was performed of
the left uterine artery with a Renegade high flow catheter over a
fathom guide wire. Selective left uterine angiogram performed. This
demonstrates an enlarged tortuous left uterine artery supplying the
large dominant midline uterine fibroid. From this location,
embolization was performed of the left uterine artery with 2 vials
of 500- 700 micron embospheres and 7 vials of 700 - 900 micron
embospheres. Following embolization, the left uterine vascular
territory has significant reduction in flow with some residual
patent vertical branches.

Micro catheter was removed. C2 catheter was retracted to select the
ipsilateral right internal iliac artery. Selective right internal
iliac angiogram performed. The tortuous left uterine artery was
localized. For micro catheterization, the Renegade high flow
catheter over a double angled Glidewire was advanced peripherally
into the right uterine artery. Selective right uterine angiogram
demonstrates an enlarged tortuous right uterine artery. Particle
embolization performed with 6 vials of 500-700 micron embospheres.
Post embolization, there is near-complete stasis within the right
uterine artery.

Catheters removed. Sheath removed. Hemostasis obtained with manual
compression. Patient tolerated the procedure well. No immediate
complication.
IMPRESSION: Successful bilateral uterine artery embolization side (UFE).

## 2015-04-09 ENCOUNTER — Ambulatory Visit (INDEPENDENT_AMBULATORY_CARE_PROVIDER_SITE_OTHER): Payer: Medicare Other | Admitting: *Deleted

## 2015-04-09 DIAGNOSIS — I509 Heart failure, unspecified: Secondary | ICD-10-CM | POA: Diagnosis not present

## 2015-04-09 DIAGNOSIS — I472 Ventricular tachycardia, unspecified: Secondary | ICD-10-CM

## 2015-04-09 DIAGNOSIS — Z95810 Presence of automatic (implantable) cardiac defibrillator: Secondary | ICD-10-CM

## 2015-04-10 NOTE — Progress Notes (Signed)
EPIC Encounter for ICM Monitoring  Patient Name: Erin Jarvis is a 50 y.o. female Date: 04/10/2015 Primary Care Physican: [name], MD Primary Cardiologist: Hochrein Electrophysiologist: Allred Dry Weight: Not weighing at home       In the past month, have you:  1. Gained more than 2 pounds in a day or more than 5 pounds in a week? unknown  2. Had changes in your medications (with verification of current medications)? Yes, Diclofenac Topical Gel for foot gout  3. Had more shortness of breath than is usual for you? no  4. Limited your activity because of shortness of breath? no  5. Not been able to sleep because of shortness of breath? no  6. Had increased swelling in your feet or ankles? no  7. Had symptoms of dehydration (dizziness, dry mouth, increased thirst, decreased urine output) no  8. Had changes in sodium restriction? no  9. Been compliant with medication? Yes   ICM trend: 3 month view 04/05/2015  ICM trend: 1 year view   Follow-up plan: ICM clinic phone appointment on 05/11/2015.  Optivol impedance below baseline 04/04/2015 to 04/07/2015 suggesting fluid retention.  She denied any symptoms but she did eat out to celebrate her birthday which would have more sodium.  She stated since then she has resumed low sodium diet. She has not taken any extra Furosemide dosages in the last month.   She stated the only thing problem she has been having is gout pain in her foot but that is improving. She stated the foot MD prescribed Diclofenac topical Gel and she was unsure if she should be using it.  She stated she knows she is not to take NSAIDs by mouth.  I stated I would discuss with the pharmacist about using the cream.   Encouraged her to call for any HF symptoms.     Discussed the use of Diclofenac Topical Gel usage in HF patients with Elberta Leatherwood, Pharmacist. She stated it is fine to use the cream and patient will only get the local effects of it.  She stated it would not  have the same kidney effects that NSAIDs have, when taken by mouth.     Called patient back and explained pharmacist reported the gel is fine for her to use.   Copy of note sent to patient's primary care physician, primary cardiologist, and device following physician.  [name], RN, CCM 04/10/2015 12:54 PM

## 2015-04-10 NOTE — Progress Notes (Signed)
Remote ICD transmission.   

## 2015-04-10 NOTE — Addendum Note (Signed)
Addended by: [name] on: 04/10/2015 01:20 PM   Modules accepted: Medications

## 2015-04-19 LAB — CUP PACEART REMOTE DEVICE CHECK
Battery Voltage: 3.01 V
HighPow Impedance: 59 Ohm
HighPow Impedance: 74 Ohm
Implantable Lead Location: 753860
Implantable Lead Model: 6947
Lead Channel Impedance Value: 551 Ohm
Lead Channel Pacing Threshold Pulse Width: 0.4 ms
Lead Channel Sensing Intrinsic Amplitude: 4.875 mV
Lead Channel Setting Pacing Amplitude: 3 V
Lead Channel Setting Pacing Pulse Width: 0.4 ms
Lead Channel Setting Sensing Sensitivity: 0.3 mV
MDC IDC LEAD IMPLANT DT: 20100824
MDC IDC MSMT LEADCHNL RV PACING THRESHOLD AMPLITUDE: 1.5 V
MDC IDC MSMT LEADCHNL RV SENSING INTR AMPL: 4.875 mV
MDC IDC SESS DTM: 20170109092515
MDC IDC STAT BRADY RV PERCENT PACED: 0.01 %

## 2015-04-27 ENCOUNTER — Encounter: Payer: Self-pay | Admitting: Cardiology

## 2015-05-14 ENCOUNTER — Telehealth: Payer: Self-pay

## 2015-05-14 NOTE — Telephone Encounter (Signed)
Remote ICM transmission received.  Attempted patient call and left message for return call.   

## 2015-05-16 NOTE — Telephone Encounter (Signed)
Unable to reach patient for remote monthly ICM follow up.  Next remote ICM transmission scheduled for 06/19/2015 and patient letter sent with new date and to call if experiencing any symptoms.    Optivol thoracic impedance below reference line from 04/07/2015 to 04/20/2015 and 04/27/2015 to 04/30/2015, 05/05/2015 to 05/11/2015 suggesting fluid retention.      FYI for Dr Percival Spanish    ICM trend for 05/11/2015

## 2015-05-25 DIAGNOSIS — E1129 Type 2 diabetes mellitus with other diabetic kidney complication: Secondary | ICD-10-CM | POA: Diagnosis not present

## 2015-05-25 DIAGNOSIS — I5022 Chronic systolic (congestive) heart failure: Secondary | ICD-10-CM | POA: Diagnosis not present

## 2015-05-25 DIAGNOSIS — I10 Essential (primary) hypertension: Secondary | ICD-10-CM | POA: Diagnosis not present

## 2015-06-19 ENCOUNTER — Ambulatory Visit (INDEPENDENT_AMBULATORY_CARE_PROVIDER_SITE_OTHER): Payer: Medicare Other

## 2015-06-19 DIAGNOSIS — I509 Heart failure, unspecified: Secondary | ICD-10-CM

## 2015-06-19 DIAGNOSIS — Z95810 Presence of automatic (implantable) cardiac defibrillator: Secondary | ICD-10-CM | POA: Diagnosis not present

## 2015-06-19 NOTE — Progress Notes (Signed)
EPIC Encounter for ICM Monitoring  Patient Name: Erin Jarvis is a 50 y.o. female Date: 06/19/2015 Primary Care Physican: [name], MD Primary Cardiologist: Hochrein Electrophysiologist: Allred Dry Weight: Unknown   In the past month, have you:  1. Gained more than 2 pounds in a day or more than 5 pounds in a week? N/A  2. Had changes in your medications (with verification of current medications)? N/A  3. Had more shortness of breath than is usual for you? N/A  4. Limited your activity because of shortness of breath? N/A  5. Not been able to sleep because of shortness of breath? N/A  6. Had increased swelling in your feet or ankles? N/A  7. Had symptoms of dehydration (dizziness, dry mouth, increased thirst, decreased urine output) N/A  8. Had changes in sodium restriction? N/A  9. Been compliant with medication? N/A   ICM trend: 3 month view for 06/19/2015   ICM trend: 1 year view for 06/19/2015   Follow-up plan: ICM clinic phone appointment on 07/23/2015.  Attempted call to patient x 2 and unable to reach.  Transmission reviewed.  Thoracic impedance below reference line from 06/06/2015 to 06/19/2015 suggesting fluid accumulation with one day being at reference line during that time.  Thoracic impedance trending back toward reference line 06/19/2015.    [name], RN, CCM 06/19/2015 1:30 PM

## 2015-06-21 ENCOUNTER — Telehealth: Payer: Self-pay

## 2015-06-21 NOTE — Telephone Encounter (Signed)
Remote ICM transmission received.  Attempted patient call and left message for return call.   

## 2015-07-09 ENCOUNTER — Ambulatory Visit (INDEPENDENT_AMBULATORY_CARE_PROVIDER_SITE_OTHER): Payer: Medicare Other | Admitting: *Deleted

## 2015-07-09 DIAGNOSIS — I472 Ventricular tachycardia, unspecified: Secondary | ICD-10-CM

## 2015-07-09 DIAGNOSIS — Z95810 Presence of automatic (implantable) cardiac defibrillator: Secondary | ICD-10-CM | POA: Diagnosis not present

## 2015-07-09 NOTE — Progress Notes (Signed)
Remote ICD transmission.   

## 2015-07-23 ENCOUNTER — Telehealth: Payer: Self-pay

## 2015-07-23 ENCOUNTER — Ambulatory Visit (INDEPENDENT_AMBULATORY_CARE_PROVIDER_SITE_OTHER): Payer: Medicare Other

## 2015-07-23 DIAGNOSIS — I509 Heart failure, unspecified: Secondary | ICD-10-CM

## 2015-07-23 DIAGNOSIS — Z95810 Presence of automatic (implantable) cardiac defibrillator: Secondary | ICD-10-CM

## 2015-07-23 NOTE — Telephone Encounter (Signed)
Remote ICM transmission received.  Attempted patient call and left message for return call.   

## 2015-07-23 NOTE — Progress Notes (Signed)
Received call back from patient.  No weight recorded.   Reviewed transmission and she stated she had a little hand swelling during the time thoracic impedance was decreased.  She is taking Furosemide prn instead of daily due it exacerbates her gout if taken daily.  Suggested to make her yearly appointment with Dr Percival Spanish to discuss.  She stated she is feeling fine at this time.  No other changes in her meds.  Denied any current fluid symptoms.  Next transmission 08/23/2015.

## 2015-07-23 NOTE — Progress Notes (Signed)
EPIC Encounter for ICM Monitoring  Patient Name: Erin Jarvis is a 50 y.o. female Date: 07/23/2015 Primary Care Physican: [name], MD Primary Cardiologist: Hochrein Electrophysiologist: Allred Dry Weight: unknown    In the past month, have you:  1. Gained more than 2 pounds in a day or more than 5 pounds in a week? N/A  2. Had changes in your medications (with verification of current medications)? N/A  3. Had more shortness of breath than is usual for you? N/A  4. Limited your activity because of shortness of breath? N/A  5. Not been able to sleep because of shortness of breath? N/A  6. Had increased swelling in your feet or ankles? N/A  7. Had symptoms of dehydration (dizziness, dry mouth, increased thirst, decreased urine output) N/A  8. Had changes in sodium restriction? N/A  9. Been compliant with medication? N/A   ICM trend: 3 month view for 07/23/2015   ICM trend: 1 year view for 07/23/2015   Follow-up plan: ICM clinic phone appointment on  08/23/2015.  Attempted call to patient and unable to reach.  Transmission reviewed.  Thoracic impedance below reference line from 07/08/2015 to 07/16/2015 suggesting fluid accumulation and returned to reference line on 07/16/2015 suggesting fluid levels have stablized.     [name], RN, CCM 07/23/2015 10:54 AM

## 2015-07-31 LAB — CUP PACEART REMOTE DEVICE CHECK
Battery Voltage: 2.98 V
Date Time Interrogation Session: 20170410062507
HIGH POWER IMPEDANCE MEASURED VALUE: 59 Ohm
HighPow Impedance: 79 Ohm
Implantable Lead Model: 6947
Lead Channel Pacing Threshold Pulse Width: 0.4 ms
Lead Channel Sensing Intrinsic Amplitude: 4.5 mV
Lead Channel Setting Pacing Amplitude: 3 V
Lead Channel Setting Pacing Pulse Width: 0.4 ms
Lead Channel Setting Sensing Sensitivity: 0.3 mV
MDC IDC LEAD IMPLANT DT: 20100824
MDC IDC LEAD LOCATION: 753860
MDC IDC MSMT LEADCHNL RV IMPEDANCE VALUE: 475 Ohm
MDC IDC MSMT LEADCHNL RV PACING THRESHOLD AMPLITUDE: 1.25 V
MDC IDC MSMT LEADCHNL RV SENSING INTR AMPL: 4.5 mV
MDC IDC STAT BRADY RV PERCENT PACED: 0.01 %

## 2015-08-08 ENCOUNTER — Encounter: Payer: Self-pay | Admitting: Cardiology

## 2015-08-23 ENCOUNTER — Ambulatory Visit (INDEPENDENT_AMBULATORY_CARE_PROVIDER_SITE_OTHER): Payer: Medicare Other

## 2015-08-23 DIAGNOSIS — I509 Heart failure, unspecified: Secondary | ICD-10-CM

## 2015-08-23 DIAGNOSIS — Z95810 Presence of automatic (implantable) cardiac defibrillator: Secondary | ICD-10-CM

## 2015-08-24 NOTE — Progress Notes (Signed)
EPIC Encounter for ICM Monitoring  Patient Name: Erin Jarvis is a 50 y.o. female Date: 08/24/2015 Primary Care Physican: [name], MD Primary Cardiologist: Hochrein Electrophysiologist: Allred Dry Weight: unknown   In the past month, have you:  1. Gained more than 2 pounds in a day or more than 5 pounds in a week? N/A  2. Had changes in your medications (with verification of current medications)? N/A  3. Had more shortness of breath than is usual for you? N/A  4. Limited your activity because of shortness of breath? N/A  5. Not been able to sleep because of shortness of breath? N/A  6. Had increased swelling in your feet or ankles? N/A  7. Had symptoms of dehydration (dizziness, dry mouth, increased thirst, decreased urine output) N/A  8. Had changes in sodium restriction? N/A  9. Been compliant with medication? N/A   ICM trend: 3 month view for 08/23/2015   ICM trend: 1 year view for 08/23/2015   Follow-up plan: ICM clinic phone appointment on  08/29/2015.  Attempted call to patient and unable to reach.  Transmission reviewed.  Thoracic impedance below reference line from 08/12/2015 and progressively worsening to 08/23/2015 suggesting fluid accumulation.    Rescheduled ICM transmission 08/29/2015.    [name], RN, CCM 08/24/2015 9:56 AM

## 2015-08-29 ENCOUNTER — Telehealth: Payer: Self-pay

## 2015-08-29 ENCOUNTER — Ambulatory Visit (INDEPENDENT_AMBULATORY_CARE_PROVIDER_SITE_OTHER): Payer: Medicare Other

## 2015-08-29 DIAGNOSIS — I509 Heart failure, unspecified: Secondary | ICD-10-CM

## 2015-08-29 DIAGNOSIS — Z95810 Presence of automatic (implantable) cardiac defibrillator: Secondary | ICD-10-CM | POA: Diagnosis not present

## 2015-08-29 NOTE — Telephone Encounter (Signed)
Remote ICM transmission received.  Attempted patient call and left message for return call.   

## 2015-08-29 NOTE — Progress Notes (Signed)
EPIC Encounter for ICM Monitoring  Patient Name: Erin Jarvis is a 50 y.o. female Date: 08/29/2015 Primary Care Physican: [name], MD Primary Cardiologist: Hochrein Electrophysiologist: Allred Dry Weight: unknown      In the past month, have you:  1. Gained more than 2 pounds in a day or more than 5 pounds in a week? unknown  2. Had changes in your medications (with verification of current medications)? no  3. Had more shortness of breath than is usual for you? no  4. Limited your activity because of shortness of breath? no  5. Not been able to sleep because of shortness of breath? no  6. Had increased swelling in your feet, ankles, legs or stomach area? no  7. Had symptoms of dehydration (dizziness, dry mouth, increased thirst, decreased urine output) no  8. Had changes in sodium restriction? no  9. Been compliant with medication?  No, she does not take Furosemide daily due to it causes gout flare up.  She has been taking as needed.  Encouraged her to discuss with Dr Percival Spanish at next appointment.  ICM trend: 3 month view for 08/29/2015   ICM trend: 1 year view for 08/29/2015   Follow-up plan: ICM clinic phone appointment 10/08/2015.  She plans on making an appointment with Dr Percival Spanish since it has been a year in June since her last visit.  FLUID LEVELS:   Optivol thoracic impedance decreased 08/12/2015 to 08/29/2015 suggesting fluid accumulation and returned to baseline 08/23/2015.    SYMPTOMS:  She reported fatigue during the time of decreased impedance and taking Furosemide prn alleviated the fatigue.  She denied any leg swelling.  Encouraged her to weigh daily to monitor for fluid weight.   Encouraged to call for any fluid symptoms.  She denied any fluid symptoms.   EDUCATION:  She is unsure if the fluid retention is related to her diet.  Encouraged her to limit sodium intake to < 2000 mg and fluid intake to 64 oz daily.     RECOMMENDATIONS: No changes today.     Advised will send to PCP, Dr Percival Spanish and Dr Rayann Heman for review regarding symptoms and decreased thoracic impedance.  If any recommendations, will call back.    [name], RN, CCM 08/29/2015 9:12 AM

## 2015-08-29 NOTE — Addendum Note (Signed)
Addended by: [name] on: 08/29/2015 09:44 AM   Modules accepted: Level of Service

## 2015-09-04 DIAGNOSIS — Z0000 Encounter for general adult medical examination without abnormal findings: Secondary | ICD-10-CM | POA: Diagnosis not present

## 2015-09-04 DIAGNOSIS — E1122 Type 2 diabetes mellitus with diabetic chronic kidney disease: Secondary | ICD-10-CM | POA: Diagnosis not present

## 2015-09-04 DIAGNOSIS — I5032 Chronic diastolic (congestive) heart failure: Secondary | ICD-10-CM | POA: Diagnosis not present

## 2015-09-04 DIAGNOSIS — Z1389 Encounter for screening for other disorder: Secondary | ICD-10-CM | POA: Diagnosis not present

## 2015-09-04 DIAGNOSIS — E784 Other hyperlipidemia: Secondary | ICD-10-CM | POA: Diagnosis not present

## 2015-09-04 DIAGNOSIS — I10 Essential (primary) hypertension: Secondary | ICD-10-CM | POA: Diagnosis not present

## 2015-10-08 ENCOUNTER — Ambulatory Visit (INDEPENDENT_AMBULATORY_CARE_PROVIDER_SITE_OTHER): Payer: Medicare Other | Admitting: *Deleted

## 2015-10-08 ENCOUNTER — Telehealth: Payer: Self-pay

## 2015-10-08 DIAGNOSIS — I472 Ventricular tachycardia, unspecified: Secondary | ICD-10-CM

## 2015-10-08 DIAGNOSIS — Z95810 Presence of automatic (implantable) cardiac defibrillator: Secondary | ICD-10-CM

## 2015-10-08 DIAGNOSIS — I509 Heart failure, unspecified: Secondary | ICD-10-CM

## 2015-10-08 NOTE — Telephone Encounter (Signed)
Remote ICM transmission received.  Attempted patient call and left message for return call.   

## 2015-10-08 NOTE — Progress Notes (Signed)
Remote ICD transmission.   

## 2015-10-08 NOTE — Progress Notes (Signed)
EPIC Encounter for ICM Monitoring  Patient Name: Erin Jarvis is a 50 y.o. female Date: 10/08/2015 Primary Care Physican: [name], MD Primary Cardiologist: Hochrein Electrophysiologist: Allred Dry Weight: unknown        Attempted patient call and unable to reach.  Transmission reviewed.   Thoracic impedence below reference line since 10/01/2015 suggesting fluid accumulation.    Unable to make recommendations since unable to reach patient.     ICM trend: 10/08/2015     Follow-up plan: ICM clinic phone appointment on 11/08/2015.  Patient due to make an appointment with Dr Percival Spanish.   Copy of ICM check sent to primary cardiologist and device physician.   [name], RN 10/08/2015 8:32 AM

## 2015-10-09 ENCOUNTER — Telehealth: Payer: Self-pay

## 2015-10-09 DIAGNOSIS — Z79899 Other long term (current) drug therapy: Secondary | ICD-10-CM | POA: Diagnosis not present

## 2015-10-09 DIAGNOSIS — M1731 Unilateral post-traumatic osteoarthritis, right knee: Secondary | ICD-10-CM | POA: Diagnosis not present

## 2015-10-09 DIAGNOSIS — M25561 Pain in right knee: Secondary | ICD-10-CM | POA: Diagnosis not present

## 2015-10-09 DIAGNOSIS — E119 Type 2 diabetes mellitus without complications: Secondary | ICD-10-CM | POA: Diagnosis not present

## 2015-10-09 DIAGNOSIS — X501XXA Overexertion from prolonged static or awkward postures, initial encounter: Secondary | ICD-10-CM | POA: Diagnosis not present

## 2015-10-09 DIAGNOSIS — S8391XA Sprain of unspecified site of right knee, initial encounter: Secondary | ICD-10-CM | POA: Diagnosis not present

## 2015-10-09 DIAGNOSIS — Z7984 Long term (current) use of oral hypoglycemic drugs: Secondary | ICD-10-CM | POA: Diagnosis not present

## 2015-10-09 DIAGNOSIS — I10 Essential (primary) hypertension: Secondary | ICD-10-CM | POA: Diagnosis not present

## 2015-10-09 LAB — CUP PACEART REMOTE DEVICE CHECK
Battery Voltage: 2.98 V
Date Time Interrogation Session: 20170710062727
HIGH POWER IMPEDANCE MEASURED VALUE: 78 Ohm
HighPow Impedance: 58 Ohm
Implantable Lead Implant Date: 20100824
Implantable Lead Model: 6947
Lead Channel Pacing Threshold Pulse Width: 0.4 ms
Lead Channel Setting Pacing Pulse Width: 0.4 ms
Lead Channel Setting Sensing Sensitivity: 0.3 mV
MDC IDC LEAD LOCATION: 753860
MDC IDC MSMT LEADCHNL RV IMPEDANCE VALUE: 437 Ohm
MDC IDC MSMT LEADCHNL RV PACING THRESHOLD AMPLITUDE: 1.25 V
MDC IDC MSMT LEADCHNL RV SENSING INTR AMPL: 4.75 mV
MDC IDC MSMT LEADCHNL RV SENSING INTR AMPL: 4.75 mV
MDC IDC SET LEADCHNL RV PACING AMPLITUDE: 3 V
MDC IDC STAT BRADY RV PERCENT PACED: 0.01 %

## 2015-10-09 NOTE — Telephone Encounter (Signed)
Contacted PCP office and requested faxed copy of latest labs.  She stated she will fax over today.

## 2015-10-09 NOTE — Progress Notes (Addendum)
Spoke with patient.  Reviewed ICM remote transmission.  She stated she had some hand swelling in last week due to foods she ate during July 4th holiday.  She did take Furosemide 2 days ago with relief of hand swelling.  She stated she takes Furosemide as needed and will take another dose today.  She stated other than hand swelling she is feeling good.  Reminded her to make an appointment with Dr Percival Spanish.  She stated she will call and make one for August.  She has mammogram and colonoscopy scheduled for this month.  Reminded her to limit salt to 2000 mg daily.  She does not have a scale to weigh but intends on getting one.  She reported PCP drew lab work in the last month and asked if she can bring that to Dr Hochrein's appointment and encouraged her to bring the results.  Advised I will call PCP office to obtain a copy as well.  Patient gives verbal permission to leave detailed message on cell phone.

## 2015-10-10 ENCOUNTER — Encounter: Payer: Self-pay | Admitting: Cardiology

## 2015-11-06 DIAGNOSIS — I10 Essential (primary) hypertension: Secondary | ICD-10-CM | POA: Diagnosis not present

## 2015-11-06 DIAGNOSIS — Z7984 Long term (current) use of oral hypoglycemic drugs: Secondary | ICD-10-CM | POA: Diagnosis not present

## 2015-11-06 DIAGNOSIS — Z836 Family history of other diseases of the respiratory system: Secondary | ICD-10-CM | POA: Diagnosis not present

## 2015-11-06 DIAGNOSIS — Z95810 Presence of automatic (implantable) cardiac defibrillator: Secondary | ICD-10-CM | POA: Diagnosis not present

## 2015-11-06 DIAGNOSIS — Z8249 Family history of ischemic heart disease and other diseases of the circulatory system: Secondary | ICD-10-CM | POA: Diagnosis not present

## 2015-11-06 DIAGNOSIS — E119 Type 2 diabetes mellitus without complications: Secondary | ICD-10-CM | POA: Diagnosis not present

## 2015-11-06 DIAGNOSIS — Z79899 Other long term (current) drug therapy: Secondary | ICD-10-CM | POA: Diagnosis not present

## 2015-11-06 DIAGNOSIS — Z1211 Encounter for screening for malignant neoplasm of colon: Secondary | ICD-10-CM | POA: Diagnosis not present

## 2015-11-06 DIAGNOSIS — E785 Hyperlipidemia, unspecified: Secondary | ICD-10-CM | POA: Diagnosis not present

## 2015-11-06 DIAGNOSIS — Z886 Allergy status to analgesic agent status: Secondary | ICD-10-CM | POA: Diagnosis not present

## 2015-11-06 DIAGNOSIS — Z8261 Family history of arthritis: Secondary | ICD-10-CM | POA: Diagnosis not present

## 2015-11-09 ENCOUNTER — Encounter (INDEPENDENT_AMBULATORY_CARE_PROVIDER_SITE_OTHER): Payer: Medicare Other

## 2015-11-09 ENCOUNTER — Telehealth: Payer: Self-pay

## 2015-11-09 DIAGNOSIS — Z95810 Presence of automatic (implantable) cardiac defibrillator: Secondary | ICD-10-CM | POA: Diagnosis not present

## 2015-11-09 DIAGNOSIS — I509 Heart failure, unspecified: Secondary | ICD-10-CM

## 2015-11-09 NOTE — Telephone Encounter (Signed)
Remote ICM transmission received.  Attempted patient call and left message for return call.   

## 2015-11-09 NOTE — Progress Notes (Unsigned)
EPIC Encounter for ICM Monitoring  Patient Name: Erin Jarvis is a 50 y.o. female Date: 11/09/2015 Primary Care Physican: [name], MD Primary Cardiologist: Hochrein Electrophysiologist: Allred Dry Weight:  unknown       Heart Failure questions, patient symptomatic with swelling in hands and feet.  She had a procedure completed a few days ago and received fluids during that time.  Thoracic impedance abnormal suggesting fluid accumulation 10/26/2015 to 11/09/2015 with one day at baseline during that time.  Recommendations: Furosemide is prescribed as needed.  She took Furosemide 20 mg 1 tablet yesterday and advised to take 1 tablet today and tomorrow.  She can return to prn after the 3rd day.    Will send copy to Dr Percival Spanish and Dr Rayann Heman for review and if any further recommendations are needed.   Repeat ICM transmission 11/14/2015.  ICM trend: 11/09/2015     Follow-up plan: ICM clinic phone appointment on 11/14/2015.  Office appointment with Dr Rayann Heman on 12/28/2015.  Copy of ICM check sent to primary cardiologist and device physician.   [name], RN 11/09/2015 8:50 AM

## 2015-11-14 ENCOUNTER — Telehealth: Payer: Self-pay

## 2015-11-14 ENCOUNTER — Ambulatory Visit (INDEPENDENT_AMBULATORY_CARE_PROVIDER_SITE_OTHER): Payer: Medicare Other

## 2015-11-14 DIAGNOSIS — Z95810 Presence of automatic (implantable) cardiac defibrillator: Secondary | ICD-10-CM | POA: Diagnosis not present

## 2015-11-14 DIAGNOSIS — I509 Heart failure, unspecified: Secondary | ICD-10-CM | POA: Diagnosis not present

## 2015-11-14 NOTE — Progress Notes (Signed)
EPIC Encounter for ICM Monitoring  Patient Name: Erin Jarvis is a 50 y.o. female Date: 11/14/2015 Primary Care Physican: [name], MD Primary Cardiologist: Hochrein Electrophysiologist: Allred Dry Weight: unknown      Attempted patient call and unable to reach.  Transmission reviewed.   Thoracic impedance abnormal suggesting fluid accumulation 10/24/2015 to 11/08/2015 and returned to normal 11/08/2015.   Follow-up plan: ICM clinic phone appointment on 01/28/2016 since she has an office appointment with Dr Rayann Heman 12/28/2015.  Copy of ICM check sent to device physician.   ICM trend: 11/14/2015       Erin Billings, RN 11/14/2015 8:55 AM

## 2015-11-14 NOTE — Telephone Encounter (Signed)
Remote ICM transmission received.  Attempted patient call and left detailed message and return call.

## 2015-12-01 DIAGNOSIS — R609 Edema, unspecified: Secondary | ICD-10-CM | POA: Diagnosis not present

## 2015-12-01 DIAGNOSIS — M79661 Pain in right lower leg: Secondary | ICD-10-CM | POA: Diagnosis not present

## 2015-12-01 DIAGNOSIS — R0600 Dyspnea, unspecified: Secondary | ICD-10-CM | POA: Diagnosis not present

## 2015-12-01 DIAGNOSIS — M79605 Pain in left leg: Secondary | ICD-10-CM | POA: Diagnosis not present

## 2015-12-01 DIAGNOSIS — E876 Hypokalemia: Secondary | ICD-10-CM | POA: Diagnosis not present

## 2015-12-01 DIAGNOSIS — M79662 Pain in left lower leg: Secondary | ICD-10-CM | POA: Diagnosis not present

## 2015-12-01 DIAGNOSIS — M79604 Pain in right leg: Secondary | ICD-10-CM | POA: Diagnosis not present

## 2015-12-01 DIAGNOSIS — N390 Urinary tract infection, site not specified: Secondary | ICD-10-CM | POA: Diagnosis not present

## 2015-12-01 DIAGNOSIS — R600 Localized edema: Secondary | ICD-10-CM | POA: Diagnosis not present

## 2015-12-25 DIAGNOSIS — E784 Other hyperlipidemia: Secondary | ICD-10-CM | POA: Diagnosis not present

## 2015-12-25 DIAGNOSIS — I5032 Chronic diastolic (congestive) heart failure: Secondary | ICD-10-CM | POA: Diagnosis not present

## 2015-12-25 DIAGNOSIS — I10 Essential (primary) hypertension: Secondary | ICD-10-CM | POA: Diagnosis not present

## 2015-12-25 DIAGNOSIS — E1122 Type 2 diabetes mellitus with diabetic chronic kidney disease: Secondary | ICD-10-CM | POA: Diagnosis not present

## 2015-12-25 DIAGNOSIS — N3000 Acute cystitis without hematuria: Secondary | ICD-10-CM | POA: Diagnosis not present

## 2015-12-28 ENCOUNTER — Ambulatory Visit (INDEPENDENT_AMBULATORY_CARE_PROVIDER_SITE_OTHER): Payer: Medicare Other | Admitting: Internal Medicine

## 2015-12-28 ENCOUNTER — Encounter: Payer: Self-pay | Admitting: Internal Medicine

## 2015-12-28 VITALS — BP 129/83 | HR 81 | Ht 67.0 in | Wt 290.0 lb

## 2015-12-28 DIAGNOSIS — Z95810 Presence of automatic (implantable) cardiac defibrillator: Secondary | ICD-10-CM

## 2015-12-28 DIAGNOSIS — I472 Ventricular tachycardia, unspecified: Secondary | ICD-10-CM

## 2015-12-28 DIAGNOSIS — I509 Heart failure, unspecified: Secondary | ICD-10-CM | POA: Diagnosis not present

## 2015-12-28 MED ORDER — CARVEDILOL 25 MG PO TABS: 25.0000 mg | ORAL_TABLET | Freq: Two times a day (BID) | ORAL | 11 refills | Status: DC

## 2015-12-28 NOTE — Progress Notes (Signed)
PCP: [name], MD Primary Cardiologist: Erin Jarvis is a 50 y.o. female who presents today for routine electrophysiology followup.  Since last being seen in our clinic, the patient reports doing very well.  Unfortunately, she has quite exercising and has gained 50 lbs.  She reports family stress issues with increased eating. She has also had difficulty with ankle pain which limits exercise.  Today, she denies symptoms of palpitations, chest pain, shortness of breath,  lower extremity edema, dizziness, presyncope, syncope, or ICD shocks.  The patient is otherwise without complaint today.   Past Medical History:  Diagnosis Date  . AICD (automatic cardioverter/defibrillator) present 2002, 2010   secondary to post-partum cardiomyopathy (1996)  . CHF (congestive heart failure) ([place])   . Diabetes mellitus without complication ([place])   . Hypertension   . Hypertensive cardiovascular disease   . Obesity   . Postpartum cardiomyopathy    Last echocardiogram was last year and EF 45 -50 %  . Seasonal allergies   . Sleep apnea    complaint with CPAP  . Ventricular tachycardia ([facility])    s/p ICD Implantation (MDT Secura VR)   Past Surgical History:  Procedure Laterality Date  . CARDIAC DEFIBRILLATOR PLACEMENT  08/12/2000, 11/11/2008   Most recent Generator MDT Secura VR placed in [place]  . CESAREAN SECTION  1996  . CHOLECYSTECTOMY  2004    Current Outpatient Prescriptions  Medication Sig Dispense Refill  . carvedilol (COREG) 25 MG tablet Take 1 tablet (25 mg total) by mouth 2 (two) times daily with a meal. 60 tablet 11  . Fish Oil-Cholecalciferol (FISH OIL + D3 PO) Take 1 capsule by mouth daily.    . furosemide (LASIX) 20 MG tablet Take 20 mg by mouth daily as needed.    Marland Kitchen lisinopril (PRINIVIL,ZESTRIL) 20 MG tablet Take 1 tablet (20 mg total) by mouth 2 (two) times daily. 180 tablet 3  . metFORMIN (GLUCOPHAGE) 500 MG tablet Take 500 mg by mouth 2 (two) times daily with a  meal.    . Multiple Vitamin (MULTIVITAMIN) tablet Take 1 tablet by mouth daily.     No current facility-administered medications for this visit.     Physical Exam: Vitals:   12/28/15 0940  BP: 129/83  Pulse: 81  SpO2: 98%  Weight: 290 lb (131.5 kg)  Height: 5\' 7"  (1.702 m)    GEN- The patient is overweight appearing, alert and oriented x 3 today.   Head- normocephalic, atraumatic Eyes-  Sclera clear, conjunctiva pink Ears- hearing intact Oropharynx- clear Lungs- Clear to ausculation bilaterally, normal work of breathing Chest- ICD pocket is well healed Heart- Regular rate and rhythm, no murmurs, rubs or gallops, PMI not laterally displaced GI- soft, NT, ND, + BS Extremities- no clubbing, cyanosis, or edema  ICD interrogation- reviewed in detail today,  See PACEART report  Assessment and Plan:  1. Postpartum cardiomyopathy  clnically stable Followed in [facility] clinic   2. Ventricular tachycardia  Normal ICD function today See Pace Art report  3. OSA Clinically improved with CPAP  4. Obesity Body mass index is 45.42 kg/m. Unfortunately, her weight has significantly increased over the past year.  She is considering bariatric surgery.  I think that she could proceed from a cardiac standpoint if she is interested. Lifestyle modificaiton again discussed today.  Carelink every 3 months, ICM monthly Return in 1year to see me in the [place] device clinic She will follow-up with Dr Percival Spanish in [place] as scheduled  Erin Grayer MD, [facility] 12/28/2015 10:47 AM

## 2015-12-28 NOTE — Patient Instructions (Signed)
Medication Instructions:  Coreg refilled today. Continue all other medications.    Labwork: none  Testing/Procedures: none  Follow-Up: Your physician wants you to follow up in:  1 year.  You will receive a reminder letter in the mail one-two months in advance.  If you don't receive a letter, please call our office to schedule the follow up appointment   Any Other Special Instructions Will Be Listed Below (If Applicable).  If you need a refill on your cardiac medications before your next appointment, please call your pharmacy.

## 2016-01-04 ENCOUNTER — Encounter: Payer: Medicare Other | Admitting: Internal Medicine

## 2016-01-17 ENCOUNTER — Encounter: Payer: Self-pay | Admitting: Cardiology

## 2016-01-18 DIAGNOSIS — M10072 Idiopathic gout, left ankle and foot: Secondary | ICD-10-CM | POA: Diagnosis not present

## 2016-01-18 DIAGNOSIS — M79672 Pain in left foot: Secondary | ICD-10-CM | POA: Diagnosis not present

## 2016-01-18 LAB — CUP PACEART INCLINIC DEVICE CHECK
Date Time Interrogation Session: 20170929135856
HIGH POWER IMPEDANCE MEASURED VALUE: 75 Ohm
HighPow Impedance: 56 Ohm
Implantable Lead Location: 753860
Implantable Lead Model: 6947
Lead Channel Impedance Value: 475 Ohm
Lead Channel Pacing Threshold Pulse Width: 0.4 ms
MDC IDC LEAD IMPLANT DT: 20100824
MDC IDC MSMT BATTERY VOLTAGE: 2.93 V
MDC IDC MSMT LEADCHNL RV PACING THRESHOLD AMPLITUDE: 1.25 V
MDC IDC MSMT LEADCHNL RV SENSING INTR AMPL: 4.625 mV
MDC IDC SET LEADCHNL RV PACING AMPLITUDE: 2.75 V
MDC IDC SET LEADCHNL RV PACING PULSEWIDTH: 0.4 ms
MDC IDC SET LEADCHNL RV SENSING SENSITIVITY: 0.3 mV
MDC IDC STAT BRADY RV PERCENT PACED: 0.01 %

## 2016-01-28 ENCOUNTER — Ambulatory Visit (INDEPENDENT_AMBULATORY_CARE_PROVIDER_SITE_OTHER): Payer: Medicare Other

## 2016-01-28 DIAGNOSIS — I509 Heart failure, unspecified: Secondary | ICD-10-CM | POA: Diagnosis not present

## 2016-01-28 DIAGNOSIS — Z95810 Presence of automatic (implantable) cardiac defibrillator: Secondary | ICD-10-CM | POA: Diagnosis not present

## 2016-01-28 NOTE — Progress Notes (Signed)
EPIC Encounter for ICM Monitoring  Patient Name: Erin Jarvis is a 50 y.o. female Date: 01/28/2016 Primary Care Physican: [name], MD Primary Cardiologist: Hochrein Electrophysiologist: Allred Dry Weight:    242 lbs                                                Heart Failure questions reviewed, pt asymptomatic   Thoracic impedance abnormal suggesting fluid accumulation.  She has not been able to take Furosemide due to severe flare of gout.  She has started taking gout medication and will try to take Furosemide dosage today.      Recommendations:  She will take Furosemide as needed. She has not been following a low salt diet and she realizes this has caused fluid retention as well.  She stated she will get back on track now that she can walk better and get back to the gym.      Follow-up plan: ICM clinic phone appointment on 02/13/2016 to recheck fluid levels.  Copy of ICM check sent to device physician.   ICM trend: 01/28/2016       [name], RN 01/28/2016 2:47 PM

## 2016-01-30 NOTE — Progress Notes (Signed)
HPI The patient has a history of a postpartum cardiomyopathy in [place]. Her cardiomyopathy was diagnosed in 1996 and she says her EF was about 20%. After treatment it went up to about 45%. While living in [place] she had sustained ventricular tachycardia requiring placement of an ICD.  A MUGA demonstrated an EF of about 35%.  Meds have been titrated and she returns for follow up.   In 2016 she was hospitalized at [facility] and required diuresis.  She did have an echo with an EF of 45 - 50% with pulmonary pressure of 60.  She has been followed with device interrogations.  I reviewed these.  Since I last saw her she had had no cardiac complaints.  She has been very limited by gout.  She stopped her Lasix for awhile because this exacerbates the episodes.  She gained some volume as judged by device checks and weights.  She is back down to baseline.  The patient denies any new symptoms such as chest discomfort, neck or arm discomfort. There has been no new shortness of breath, PND or orthopnea. There have been no reported palpitations, presyncope or syncope.   Allergies  Allergen Reactions  . Morphine And Related Nausea Only and Other (See Comments)    Feels sick,and like she is going to pass out; can't breathe, starts sweating    Current Outpatient Prescriptions  Medication Sig Dispense Refill  . allopurinol (ZYLOPRIM) 100 MG tablet Take 100 mg by mouth daily.    . carvedilol (COREG) 25 MG tablet Take 1 tablet (25 mg total) by mouth 2 (two) times daily with a meal. 60 tablet 11  . Fish Oil-Cholecalciferol (FISH OIL + D3 PO) Take 1 capsule by mouth daily.    Marland Kitchen lisinopril (PRINIVIL,ZESTRIL) 40 MG tablet Take 40 mg by mouth daily.    . metFORMIN (GLUCOPHAGE) 500 MG tablet Take 500 mg by mouth 2 (two) times daily with a meal.    . Multiple Vitamin (MULTIVITAMIN) tablet Take 1 tablet by mouth daily.     No current facility-administered medications for this visit.      Past Medical History:  Diagnosis Date  . AICD (automatic cardioverter/defibrillator) present 2002, 2010   secondary to post-partum cardiomyopathy (1996)  . CHF (congestive heart failure) ([place])   . Diabetes mellitus without complication ([place])   . Hypertension   . Hypertensive cardiovascular disease   . Obesity   . Postpartum cardiomyopathy    Last echocardiogram was last year and EF 45 -50 %  . Seasonal allergies   . Sleep apnea    complaint with CPAP  . Ventricular tachycardia (HCC)    s/p ICD Implantation (MDT Secura VR)    Past Surgical History:  Procedure Laterality Date  . CARDIAC DEFIBRILLATOR PLACEMENT  08/12/2000, 11/11/2008   Most recent Generator MDT Secura VR placed in [place]  . CESAREAN SECTION  1996  . CHOLECYSTECTOMY  2004    ROS:  Positive for gout.  Otherwise as stated in the HPI and negative for all other systems.   PHYSICAL EXAM BP (!) 118/96   Pulse 89   Ht 5\' 7"  (1.702 m)   Wt 290 lb (131.5 kg)   BMI 45.42 kg/m  GENERAL:  Well appearing HEENT:  Pupils equal round and reactive, fundi not visualized, oral mucosa unremarkable NECK:  No jugular venous distention, waveform within normal limits, carotid upstroke brisk and symmetric, no bruits, no thyromegaly LUNGS:  Clear to auscultation bilaterally CHEST:  Well  healed ICD pocket HEART:  PMI not displaced or sustained,S1 and S2 within normal limits, no S3, no S4, no clicks, no rubs, no murmurs ABD:  Flat, positive bowel sounds normal in frequency in pitch, no bruits, no rebound, no guarding, no midline pulsatile mass, no hepatomegaly, no splenomegaly, obese , stria, mild diffuse erythema, distended abdomen with swelling and some subcutaneous edema EXT:  2 plus pulses throughout, no edema, no cyanosis no clubbing EXT:  No edema    ASSESSMENT AND PLAN  Peripartum cardiomyopathy - She 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  I would like to review an echo to reassess her EF and pulmonary pressures.   Hypertension  - The blood pressure is at target. No change in medications is indicated. We will continue with therapeutic lifestyle changes (TLC).  Sleep Apnea -    She uses this sometimes.  She wants to consider bariatric surgery and I think that this would be excellent.  This certainly could help with sleep apnea.    Obesity - She unfortunately has gained weight back.  As above.  ICD - She is up to date with follow up.

## 2016-01-31 ENCOUNTER — Encounter: Payer: Self-pay | Admitting: Cardiology

## 2016-01-31 ENCOUNTER — Ambulatory Visit (INDEPENDENT_AMBULATORY_CARE_PROVIDER_SITE_OTHER): Payer: Medicare Other | Admitting: Cardiology

## 2016-01-31 VITALS — BP 118/96 | HR 89 | Ht 67.0 in | Wt 290.0 lb

## 2016-01-31 DIAGNOSIS — O903 Peripartum cardiomyopathy: Secondary | ICD-10-CM | POA: Diagnosis not present

## 2016-01-31 NOTE — Patient Instructions (Signed)

## 2016-02-13 ENCOUNTER — Telehealth: Payer: Self-pay

## 2016-02-13 ENCOUNTER — Ambulatory Visit (INDEPENDENT_AMBULATORY_CARE_PROVIDER_SITE_OTHER): Payer: Medicare Other

## 2016-02-13 DIAGNOSIS — Z95810 Presence of automatic (implantable) cardiac defibrillator: Secondary | ICD-10-CM

## 2016-02-13 DIAGNOSIS — I509 Heart failure, unspecified: Secondary | ICD-10-CM

## 2016-02-13 NOTE — Progress Notes (Signed)
EPIC Encounter for ICM Monitoring  Patient Name: Erin Jarvis is a 50 y.o. female Date: 02/13/2016 Primary Care Physican: [name], MD Primary Cardiologist:Hochrein Electrophysiologist: Allred Dry Weight:unknown       Attempted ICM call and unable to reach. Left detailed message regarding transmission.  Transmission reviewed.   Thoracic impedance abnormal suggesting fluid accumulation.    Follow-up plan: ICM clinic phone appointment on 03/07/2016.  Copy of ICM check sent to primary cardiologist and device physician.   ICM trend: 02/13/2016       [name], RN 02/13/2016 3:56 PM

## 2016-02-13 NOTE — Telephone Encounter (Signed)
Remote ICM transmission received.  Attempted patient call and left detailed message regarding transmission   

## 2016-02-25 ENCOUNTER — Other Ambulatory Visit (HOSPITAL_COMMUNITY): Payer: Medicare Other

## 2016-03-07 ENCOUNTER — Ambulatory Visit (INDEPENDENT_AMBULATORY_CARE_PROVIDER_SITE_OTHER): Payer: Medicare Other

## 2016-03-07 DIAGNOSIS — Z95810 Presence of automatic (implantable) cardiac defibrillator: Secondary | ICD-10-CM | POA: Diagnosis not present

## 2016-03-07 DIAGNOSIS — I509 Heart failure, unspecified: Secondary | ICD-10-CM

## 2016-03-07 MED ORDER — FUROSEMIDE 20 MG PO TABS: ORAL_TABLET | ORAL | 6 refills | Status: DC

## 2016-03-07 MED ORDER — POTASSIUM CHLORIDE CRYS ER 20 MEQ PO TBCR: 20.0000 meq | EXTENDED_RELEASE_TABLET | Freq: Every day | ORAL | 6 refills | Status: DC

## 2016-03-07 NOTE — Progress Notes (Signed)
She is going to have to take the Lasix for awhile.  I would suggest 40 mg for 3 days.  Can we call in 20 meq of potassium to go with that and then follow up with repeat device check and BMET sometime next week.  Thanks for taking such good care of these folks.

## 2016-03-07 NOTE — Progress Notes (Signed)
Received: Today  Message Contents  Minus Breeding, MD  [name], RN        Yes.   Previous Messages    ----- Message -----  From: [name], RN  Sent: 03/07/2016  1:24 PM  To: Minus Breeding, MD   After the Furosemide 40 for 3 days, do you want her to continue on 20 mg daily?

## 2016-03-07 NOTE — Progress Notes (Signed)
Call to patient and provided Dr Hochrein's recommendation to take Furosemide 40 mg x 3 days and then decrease to 20 mg daily with Potassium 20 meq 1 tablet daily.  BMET is scheduled for 03/17/2016 since patient will be in [place] for echocardiogram.  Repeat ICM transmission on 03/17/2016.  She verbalized understanding of recommendations.

## 2016-03-07 NOTE — Progress Notes (Signed)
EPIC Encounter for ICM Monitoring  Patient Name: Erin Jarvis is a 50 y.o. female Date: 03/07/2016 Primary Care Physican: [name], MD Primary Cardiologist:Hochrein Electrophysiologist: Allred Dry Weight:300 lbs                                                 Heart Failure questions reviewed, pt asymptomatic with tiredness and swelling in hands.  She has had a cold in the last 3 days.   Thoracic impedance abnormal suggesting fluid accumulation since 02/03/2016 and has progressively worsened.  Recommendations Needed:  Furosemide 20 mg was discontinued at last office visit on 11/2 with Dr Percival Spanish.  She still has Furosemide script and is currently taking it as needed.   She stated the last time she took a dose was 12/6 which does not show improvement in Optivol.    Advised I would send note to Dr Percival Spanish for recommendation regarding if he wants to order the Furosemide and recommendation for current symptoms.   She has had problems taking diuretic in the past due to Gout.   Follow-up plan: ICM clinic phone appointment on 03/17/2016 to recheck fluid levels.  Copy of ICM check sent to primary cardiologist and device physician.   ICM trend: 03/07/2016       [name], RN 03/07/2016 11:28 AM

## 2016-03-16 DIAGNOSIS — R05 Cough: Secondary | ICD-10-CM | POA: Diagnosis not present

## 2016-03-16 DIAGNOSIS — Z7984 Long term (current) use of oral hypoglycemic drugs: Secondary | ICD-10-CM | POA: Diagnosis not present

## 2016-03-16 DIAGNOSIS — R0602 Shortness of breath: Secondary | ICD-10-CM | POA: Diagnosis not present

## 2016-03-16 DIAGNOSIS — Z95810 Presence of automatic (implantable) cardiac defibrillator: Secondary | ICD-10-CM | POA: Diagnosis not present

## 2016-03-16 DIAGNOSIS — I4891 Unspecified atrial fibrillation: Secondary | ICD-10-CM | POA: Diagnosis not present

## 2016-03-16 DIAGNOSIS — I5023 Acute on chronic systolic (congestive) heart failure: Secondary | ICD-10-CM | POA: Diagnosis present

## 2016-03-16 DIAGNOSIS — Z79899 Other long term (current) drug therapy: Secondary | ICD-10-CM | POA: Diagnosis not present

## 2016-03-16 DIAGNOSIS — M109 Gout, unspecified: Secondary | ICD-10-CM | POA: Diagnosis not present

## 2016-03-16 DIAGNOSIS — I10 Essential (primary) hypertension: Secondary | ICD-10-CM | POA: Diagnosis not present

## 2016-03-16 DIAGNOSIS — I472 Ventricular tachycardia: Secondary | ICD-10-CM | POA: Diagnosis present

## 2016-03-16 DIAGNOSIS — I482 Chronic atrial fibrillation: Secondary | ICD-10-CM | POA: Diagnosis not present

## 2016-03-16 DIAGNOSIS — O903 Peripartum cardiomyopathy: Secondary | ICD-10-CM | POA: Diagnosis present

## 2016-03-16 DIAGNOSIS — Z885 Allergy status to narcotic agent status: Secondary | ICD-10-CM | POA: Diagnosis not present

## 2016-03-16 DIAGNOSIS — Z23 Encounter for immunization: Secondary | ICD-10-CM | POA: Diagnosis not present

## 2016-03-16 DIAGNOSIS — E119 Type 2 diabetes mellitus without complications: Secondary | ICD-10-CM | POA: Diagnosis present

## 2016-03-16 DIAGNOSIS — Z6841 Body Mass Index (BMI) 40.0 and over, adult: Secondary | ICD-10-CM | POA: Diagnosis not present

## 2016-03-16 DIAGNOSIS — I110 Hypertensive heart disease with heart failure: Secondary | ICD-10-CM | POA: Diagnosis not present

## 2016-03-17 ENCOUNTER — Other Ambulatory Visit (HOSPITAL_COMMUNITY): Payer: Medicare Other

## 2016-03-17 ENCOUNTER — Telehealth: Payer: Self-pay | Admitting: Internal Medicine

## 2016-03-17 ENCOUNTER — Other Ambulatory Visit: Payer: Medicare Other

## 2016-03-17 NOTE — Telephone Encounter (Signed)
New Message  Pt voiced is hospitalized in [place].  Pt voiced if we need orders of labs or copies of labs to reach out to [facility].  Please f/u with pt

## 2016-03-17 NOTE — Telephone Encounter (Signed)
Will forward to Dr Hochrein's CMA Meredith Pel to [employer]

## 2016-03-18 ENCOUNTER — Telehealth: Payer: Self-pay | Admitting: Cardiology

## 2016-03-18 NOTE — Telephone Encounter (Signed)
Confirmed remote transmission w/ pt daughter. Pt daughter informed me that pt is in the hospital at [facility]. Informed her that pt that ICM nurse will reschedule remote appt. Pt daughter verbalized understanding.

## 2016-03-20 ENCOUNTER — Telehealth: Payer: Self-pay

## 2016-03-20 NOTE — Telephone Encounter (Signed)
Returned call to patient as requested in voice mail.  She was hospitalized for Afib and will be discharged from [place] today.  She stated she thinks she is still in Afib and has follow up appt with Dr Percival Spanish 12/27.  She reported she is being discharged on Amiodarone.  Scheduled ICM remote transmission 12/26, day before office visit.  She will ask Dr Percival Spanish how much exercise can she do.

## 2016-03-21 NOTE — Telephone Encounter (Signed)
Faxed send to [facility] hospital to get pt records, pt have an appt to see Dr Percival Spanish next week

## 2016-03-25 ENCOUNTER — Telehealth: Payer: Self-pay | Admitting: Cardiology

## 2016-03-25 NOTE — Telephone Encounter (Signed)
LMOVM reminding pt to send remote transmission.   

## 2016-03-25 NOTE — Progress Notes (Signed)
HPI The patient has a history of a postpartum cardiomyopathy in [place]. Her cardiomyopathy was diagnosed in 1996 and she says her EF was about 20%. After treatment it went up to about 45%. While living in [place] she had sustained ventricular tachycardia requiring placement of an ICD.  A MUGA demonstrated an EF of about 35%.  Meds have been titrated and she returns for follow up.   In 2016 she was hospitalized at [facility] and required diuresis.  She did have an echo with an EF of 45 - 50% with pulmonary pressure of 60.  She has been followed with device interrogations.   She called to let us know that she was in [place] with atrial fib.  She was released 12/21.  She reports she started with sneezing and coughing a couple of weeks ago. She was noted on her impedance monitor to have some increased volume and had some increased diuresis suggested. [facility] and a review these records. She was in atrial fibrillation. She was treated with Cardizem. She did have an echocardiogram which demonstrated that her ejection fraction was about 20-25%. There was moderate mitral regurgitation. Severe tricuspid regurgitation. She was started on anticoagulation. She could only afford warfarin. She was sent home with rate control. She was switched to metoprolol. However, she's apparently been taking both metoprolol and carvedilol.  Since going home she's been fatigued. She's not felt any palpitations and really didn't notice any. She's not had any chest pressure, neck or arm discomfort. She's really been slightly more short of breath lying on her left side with activities but this has been relatively mild. She's not having any cough fevers or chills. She's not having any PND or orthopnea. She's had no presyncope or syncope. She's had no chest pressure, neck or arm discomfort.  Allergies  Allergen Reactions  . Morphine And Related Nausea Only and Other (See Comments)    Feels  sick,and like she is going to pass out; can't breathe, starts sweating    Current Outpatient Prescriptions  Medication Sig Dispense Refill  . allopurinol (ZYLOPRIM) 300 MG tablet Take 300 mg by mouth daily.     Marland Kitchen amiodarone (PACERONE) 200 MG tablet Take 200 mg by mouth 2 (two) times daily.    . carvedilol (COREG) 25 MG tablet Take 1 tablet (25 mg total) by mouth 2 (two) times daily with a meal. 60 tablet 11  . Fish Oil-Cholecalciferol (FISH OIL + D3 PO) Take 1 capsule by mouth daily.    . furosemide (LASIX) 20 MG tablet Take 20 mg by mouth 2 (two) times daily.    Marland Kitchen lisinopril (PRINIVIL,ZESTRIL) 40 MG tablet Take 40 mg by mouth daily.    . metFORMIN (GLUCOPHAGE) 500 MG tablet Take 500 mg by mouth 2 (two) times daily with a meal.    . metoprolol (LOPRESSOR) 100 MG tablet Take 100 mg by mouth 2 (two) times daily.    . Multiple Vitamin (MULTIVITAMIN) tablet Take 1 tablet by mouth daily.    . potassium chloride SA (K-DUR,KLOR-CON) 20 MEQ tablet Take 1 tablet (20 mEq total) by mouth daily. 30 tablet 6  . warfarin (COUMADIN) 10 MG tablet Take 10 mg by mouth daily at 6 PM.     No current facility-administered medications for this visit.     Past Medical History:  Diagnosis Date  . AICD (automatic cardioverter/defibrillator) present 2002, 2010   secondary to post-partum cardiomyopathy (1996)  . CHF (congestive heart failure) ([place])   .  Diabetes mellitus without complication ([place])   . Hypertension   . Hypertensive cardiovascular disease   . Obesity   . Postpartum cardiomyopathy    Last echocardiogram was last year and EF 45 -50 %  . Seasonal allergies   . Sleep apnea    complaint with CPAP  . Ventricular tachycardia (HCC)    s/p ICD Implantation (MDT Secura VR)    Past Surgical History:  Procedure Laterality Date  . CARDIAC DEFIBRILLATOR PLACEMENT  08/12/2000, 11/11/2008   Most recent Generator MDT Secura VR placed in [place]  . CESAREAN SECTION  1996  . CHOLECYSTECTOMY  2004     ROS:  Positive for gout.  Otherwise as stated in the HPI and negative for all other systems.   PHYSICAL EXAM BP 92/60   Pulse (!) 104   Ht 5\' 7"  (1.702 m)   Wt (!) 309 lb (140.2 kg)   SpO2 99%   BMI 48.40 kg/m  GENERAL:  Well appearing HEENT:  Pupils equal round and reactive, fundi not visualized, oral mucosa unremarkable NECK:  No jugular venous distention, waveform within normal limits, carotid upstroke brisk and symmetric, no bruits, no thyromegaly LUNGS:  Clear to auscultation bilaterally CHEST:  Well healed ICD pocket HEART:  PMI not displaced or sustained,S1 and S2 within normal limits, no S3, no S4, no clicks, no rubs, no murmurs ABD:  Flat, positive bowel sounds normal in frequency in pitch, no bruits, no rebound, no guarding, no midline pulsatile mass, no hepatomegaly, no splenomegaly, obese , stria, mild diffuse erythema, distended abdomen with swelling and some subcutaneous edema EXT:  2 plus pulses throughout, no edema, no cyanosis no clubbing EXT:  No edema  EKG:  Atrial fibrillation, rate 147, low voltage, poor anterior R wave progression, no acute ST-T wave changes.  03/16/16  ASSESSMENT AND PLAN  Atrial fib -  Erin Jarvis has a CHA2DS2 - VASc score of 3 with a risk of stroke of 3.2%.  I would like to expedite her getting back in normal sinus rhythm as I think it contributes to her not feeling well. She's going to have a TEE cardioversion. We had her Coumadin checked today and it was only 1.5. We dose adjusted this. She'll go Tuesday evening and I like her to get an EKG and an INR check. If her Coumadin level is above two and she is still in fib then she will have TEE guided cardioversion on Wednesday and we have arranged this.  After cardioversion I like to taper down her amiodarone and she could be given instructions for this. He quit 400 mg once a day for a couple of weeks and then 200 mg once daily.  Peripartum cardiomyopathy - She seems to be  euvolemic.  She is taking both carvedilol and metoprolol and stop the carvedilol.  Her blood pressure should be reassessed in evening.  Hypertension - He was hypotensive today.  Sleep Apnea -    She was in the process of considering bariatric surgery but this will be put on hold for now.  Obesity - As above.  ICD - She is up to date with follow up.

## 2016-03-26 ENCOUNTER — Telehealth: Payer: Self-pay | Admitting: Pharmacist

## 2016-03-26 ENCOUNTER — Encounter: Payer: Self-pay | Admitting: Cardiology

## 2016-03-26 ENCOUNTER — Ambulatory Visit (INDEPENDENT_AMBULATORY_CARE_PROVIDER_SITE_OTHER): Payer: Medicare Other | Admitting: Cardiology

## 2016-03-26 VITALS — BP 92/60 | HR 104 | Ht 67.0 in | Wt 309.0 lb

## 2016-03-26 DIAGNOSIS — I4819 Other persistent atrial fibrillation: Secondary | ICD-10-CM

## 2016-03-26 DIAGNOSIS — I4891 Unspecified atrial fibrillation: Secondary | ICD-10-CM | POA: Diagnosis not present

## 2016-03-26 DIAGNOSIS — Z01818 Encounter for other preprocedural examination: Secondary | ICD-10-CM | POA: Diagnosis not present

## 2016-03-26 DIAGNOSIS — I5023 Acute on chronic systolic (congestive) heart failure: Secondary | ICD-10-CM | POA: Diagnosis not present

## 2016-03-26 DIAGNOSIS — D689 Coagulation defect, unspecified: Secondary | ICD-10-CM | POA: Diagnosis not present

## 2016-03-26 DIAGNOSIS — I481 Persistent atrial fibrillation: Secondary | ICD-10-CM | POA: Diagnosis not present

## 2016-03-26 NOTE — Telephone Encounter (Signed)
Pt in clinic today - has been taking 10mg  daily since discharge on 03/22/16.   INR 1.5 today.   Take 15 mg today and tomorrow

## 2016-03-26 NOTE — Patient Instructions (Addendum)
Medication Instructions:  INCREASE- Warfarin 15 mg today and tomorrow, check INR at PCP on Friday and at [place] office on Tuesday  Labwork: Pre-Op Labs  Testing/Procedures: Your physician has requested that you have a TEE/Cardioversion. During a TEE, sound waves are used to create images of your heart. It provides your doctor with information about the size and shape of your heart and how well your heart's chambers and valves are working. In this test, a transducer is attached to the end of a flexible tube that is guided down you throat and into your esophagus (the tube leading from your mouth to your stomach) to get a more detailed image of your heart. Once the TEE has determined that a blood clot is not present, the cardioversion begins. Electrical Cardioversion uses a jolt of electricity to your heart either through paddles or wired patches attached to your chest. This is a controlled, usually prescheduled, procedure. This procedure is done at the hospital and you are not awake during the procedure. You usually go home the day of the procedure. Please see the instruction sheet given to you today for more information.   Follow-Up: Your physician recommends that you schedule a follow-up appointment in: After Cardioversion   Any Other Special Instructions Will Be Listed Below (If Applicable).        [place]  If you need a refill on your cardiac medications before your next appointment, please call your pharmacy.

## 2016-03-26 NOTE — Telephone Encounter (Signed)
Advised patient to take 15mg  today and tomorrow and keep follow up appt as scheduled with PCP for INR check Friday 03/28/16. PCP usually manages warfarin therapy as pt lives in [place] and PCP closer to home.   Per Dr. Percival Spanish to be checked in [place] office on Tuesday 04/01/16 and if INR >2.0 ok to proceed with TEE and cardioversion Wednesday.   Pt made aware of recommendations.

## 2016-03-27 ENCOUNTER — Other Ambulatory Visit: Payer: Self-pay | Admitting: *Deleted

## 2016-03-27 DIAGNOSIS — I4819 Other persistent atrial fibrillation: Secondary | ICD-10-CM

## 2016-03-28 DIAGNOSIS — I481 Persistent atrial fibrillation: Secondary | ICD-10-CM | POA: Diagnosis not present

## 2016-03-28 DIAGNOSIS — I5022 Chronic systolic (congestive) heart failure: Secondary | ICD-10-CM | POA: Diagnosis not present

## 2016-03-28 LAB — BASIC METABOLIC PANEL
BUN: 63 mg/dL — AB (ref 7–25)
CO2: 17 mmol/L — ABNORMAL LOW (ref 20–31)
Calcium: 8.9 mg/dL (ref 8.6–10.4)
Chloride: 103 mmol/L (ref 98–110)
Creat: 2.03 mg/dL — ABNORMAL HIGH (ref 0.50–1.05)
Glucose, Bld: 177 mg/dL — ABNORMAL HIGH (ref 65–99)
POTASSIUM: 4.9 mmol/L (ref 3.5–5.3)
Sodium: 134 mmol/L — ABNORMAL LOW (ref 135–146)

## 2016-03-28 LAB — CBC
HCT: 31.8 % — ABNORMAL LOW (ref 35.0–45.0)
Hemoglobin: 10 g/dL — ABNORMAL LOW (ref 11.7–15.5)
MCH: 26.5 pg — ABNORMAL LOW (ref 27.0–33.0)
MCHC: 31.4 g/dL — AB (ref 32.0–36.0)
MCV: 84.4 fL (ref 80.0–100.0)
MPV: 10.9 fL (ref 7.5–12.5)
Platelets: 287 10*3/uL (ref 140–400)
RBC: 3.77 MIL/uL — ABNORMAL LOW (ref 3.80–5.10)
RDW: 16.4 % — AB (ref 11.0–15.0)
WBC: 8.5 10*3/uL (ref 3.8–10.8)

## 2016-03-28 LAB — APTT: aPTT: 33 s (ref 22–34)

## 2016-03-28 LAB — PROTIME-INR
INR: 2.4 — ABNORMAL HIGH
Prothrombin Time: 24.4 s — ABNORMAL HIGH (ref 9.0–11.5)

## 2016-03-28 LAB — TSH: TSH: 6.24 m[IU]/L — AB

## 2016-04-01 ENCOUNTER — Encounter (HOSPITAL_COMMUNITY): Payer: Self-pay | Admitting: Certified Registered Nurse Anesthetist

## 2016-04-01 ENCOUNTER — Telehealth: Payer: Self-pay | Admitting: Cardiology

## 2016-04-01 ENCOUNTER — Ambulatory Visit (INDEPENDENT_AMBULATORY_CARE_PROVIDER_SITE_OTHER): Payer: Medicare Other | Admitting: *Deleted

## 2016-04-01 ENCOUNTER — Telehealth: Payer: Self-pay

## 2016-04-01 DIAGNOSIS — I509 Heart failure, unspecified: Secondary | ICD-10-CM

## 2016-04-01 DIAGNOSIS — Z7984 Long term (current) use of oral hypoglycemic drugs: Secondary | ICD-10-CM | POA: Diagnosis not present

## 2016-04-01 DIAGNOSIS — T50901A Poisoning by unspecified drugs, medicaments and biological substances, accidental (unintentional), initial encounter: Secondary | ICD-10-CM | POA: Diagnosis not present

## 2016-04-01 DIAGNOSIS — Z79899 Other long term (current) drug therapy: Secondary | ICD-10-CM | POA: Diagnosis not present

## 2016-04-01 DIAGNOSIS — O903 Peripartum cardiomyopathy: Secondary | ICD-10-CM

## 2016-04-01 DIAGNOSIS — Z95810 Presence of automatic (implantable) cardiac defibrillator: Secondary | ICD-10-CM

## 2016-04-01 DIAGNOSIS — R791 Abnormal coagulation profile: Secondary | ICD-10-CM | POA: Diagnosis not present

## 2016-04-01 DIAGNOSIS — I4891 Unspecified atrial fibrillation: Secondary | ICD-10-CM | POA: Diagnosis not present

## 2016-04-01 DIAGNOSIS — T45511A Poisoning by anticoagulants, accidental (unintentional), initial encounter: Secondary | ICD-10-CM | POA: Diagnosis not present

## 2016-04-01 DIAGNOSIS — Z7902 Long term (current) use of antithrombotics/antiplatelets: Secondary | ICD-10-CM | POA: Diagnosis not present

## 2016-04-01 DIAGNOSIS — E119 Type 2 diabetes mellitus without complications: Secondary | ICD-10-CM | POA: Diagnosis not present

## 2016-04-01 DIAGNOSIS — I10 Essential (primary) hypertension: Secondary | ICD-10-CM | POA: Diagnosis not present

## 2016-04-01 LAB — PROTIME-INR

## 2016-04-01 LAB — POCT INR: INR: >8

## 2016-04-01 NOTE — Telephone Encounter (Signed)
LMOVM reminding pt to send remote transmission.   

## 2016-04-01 NOTE — Progress Notes (Signed)
Malen Gauze, RN  Erskine Emery, [facility]        Lab from [facility]  PT 151.4  INR 17.0  Please advise    Patient presented for visit with Dr. Percival Spanish on 03/26/16. She reported that she had been taking 10mg  daily. She was boosted with 15mg  for 2 days and instructed to keep her appt with PCP on 12/29 for INR check in attempt to get INR >2.0 for cardioversion on 04/03/15. She presented to clinic today for INR check as scheduled and resulted as above.   Dr. Sallyanne Kuster was notified since he was schedule to do TEE and cardioversion. He coordinated reschedule of TEE and cardioversion.    Spoke to patient she states she had a nose bleed several days ago but this has subsided. She reports she took 15mg  for 2 days until appt with PCP on 03/28/16. PCP declined to check INR as she was just checked and told her to continue same dose. She continued on 15mg  daily, instead of resuming 10mg  daily as instructed, until appt today. She reports that she was previously on warfarin 10mg  daily and was stable. She states at one time she was taking two of the 5mg  tablets and recently changed to 10mg  tablets and she wonders if that is part of the reason she is elevated. Advised that it was unlikely that was reason she was elevated, more likely due to 15mg  dose daily. She denies alcohol and GI upset. She does state that she has always eaten a lot of green leafy vegetables (including while on coumadin) but she has not had any of those recently. Pt stated she was getting ready to eat green leafy vegetables today  since INR elevated this morning. Advised no Coumadin.  Initially she reported no active signs of bleeding, but after conversation of warning signs she states she believes her stool this morning was darker than usual. Advised she head to the hospital immediately. She states that her daughter will drive her there now.   04/03/15 - called to check on patient. She reports that she is feeling much better. She was  evaluated last night at hospital and states that hemoccult test came back negative for bleed. She also reports that INR was down about 2 points from previous test. She was advised to eat more green leafy vegetables and monitor for signs and symptoms of bleeding and sent home. She states they also advised she not take coumadin until recheck, but did not give her vitamin K. She went to get Vitamin K 10mg  last night from pharmacy (previously called in by me) but did not take as it was late. Advised she take that immediately. She reports that she had a very large serving of greens yesterday. Instructed her that she should still take vitamin K tablet ASAP and take NO Coumadin. Have scheduled her for recheck in [facility] tomorrow to ensure her INR is trending down. Again advised any signs/symptoms of bleeding to call 911 immediately.

## 2016-04-01 NOTE — Telephone Encounter (Signed)
Remote ICM transmission received.  Attempted patient call and left detailed message regarding transmission and next ICM scheduled for 04/17/2016.  Advised to return call for any fluid symptoms or questions.

## 2016-04-01 NOTE — Progress Notes (Signed)
EPIC Encounter for ICM Monitoring  Patient Name: Erin Jarvis is a 51 y.o. female Date: 04/01/2016 Primary Care Physican: [name], MD Primary Cardiologist:Hoch[place] Electrophysiologist: Allred Dry Weight:Unknown      Attempted ICM call and unable to reach.  Left detailed message regarding transmission.  Transmission reviewed.   Thoracic impedance abnormal suggesting fluid accumulation.    Recommendations:  Left ICM direct number.    Follow-up plan: ICM clinic phone appointment on 04/15/2016 which is day before office visit with Dr Percival Spanish.  Patient is scheduled for cardioversion on 04/02/2016 by Dr Sallyanne Kuster.  Copy of ICM check sent to primary cardiologist and device physician.   3 month ICM trend : 04/01/2016   1 Year ICM trend:        [name], RN 04/01/2016 11:37 AM

## 2016-04-02 ENCOUNTER — Ambulatory Visit (HOSPITAL_COMMUNITY): Admission: RE | Admit: 2016-04-02 | Payer: Medicare Other | Source: Ambulatory Visit | Admitting: Cardiovascular Disease

## 2016-04-02 ENCOUNTER — Encounter: Payer: Self-pay | Admitting: Interventional Radiology

## 2016-04-02 ENCOUNTER — Encounter (HOSPITAL_COMMUNITY): Admission: RE | Payer: Self-pay | Source: Ambulatory Visit

## 2016-04-02 ENCOUNTER — Telehealth: Payer: Self-pay | Admitting: Cardiology

## 2016-04-02 SURGERY — CARDIOVERSION
Anesthesia: Monitor Anesthesia Care

## 2016-04-02 NOTE — Telephone Encounter (Signed)
Pt was scheduled for a TEE DCCV today 04/02/16 with Dr. Sallyanne Kuster. Pt had INR checked at [facility] in [place] yesterday and it was 17. Procedure was cancelled and rescheduled for 1/9 @ 2pm with Dr. Debara Pickett. I called pt today and gave her the new date and time. Pt was also given instructions to be NPO after midnight and to arrive at [facility] admitting at 12:30pm. Pt's coumadin dosing is being managed by the [place] coumadin clinic.

## 2016-04-03 ENCOUNTER — Ambulatory Visit (INDEPENDENT_AMBULATORY_CARE_PROVIDER_SITE_OTHER): Payer: Medicare Other | Admitting: *Deleted

## 2016-04-03 DIAGNOSIS — Z5181 Encounter for therapeutic drug level monitoring: Secondary | ICD-10-CM | POA: Diagnosis not present

## 2016-04-03 DIAGNOSIS — I4891 Unspecified atrial fibrillation: Secondary | ICD-10-CM | POA: Diagnosis not present

## 2016-04-03 LAB — POCT INR: INR: 2.6

## 2016-04-03 NOTE — Progress Notes (Signed)
Remote ICD transmission.   

## 2016-04-04 ENCOUNTER — Encounter: Payer: Self-pay | Admitting: Cardiology

## 2016-04-04 LAB — CUP PACEART REMOTE DEVICE CHECK
HIGH POWER IMPEDANCE MEASURED VALUE: 55 Ohm
HighPow Impedance: 69 Ohm
Implantable Lead Implant Date: 20100824
Implantable Lead Location: 753860
Implantable Lead Model: 6947
Implantable Pulse Generator Implant Date: 20100824
Lead Channel Impedance Value: 418 Ohm
Lead Channel Pacing Threshold Amplitude: 1.25 V
Lead Channel Pacing Threshold Pulse Width: 0.4 ms
Lead Channel Setting Pacing Pulse Width: 0.4 ms
Lead Channel Setting Sensing Sensitivity: 0.3 mV
MDC IDC MSMT BATTERY VOLTAGE: 2.91 V
MDC IDC MSMT LEADCHNL RV SENSING INTR AMPL: 4.125 mV
MDC IDC MSMT LEADCHNL RV SENSING INTR AMPL: 4.125 mV
MDC IDC SESS DTM: 20180102072728
MDC IDC SET LEADCHNL RV PACING AMPLITUDE: 3 V
MDC IDC STAT BRADY RV PERCENT PACED: 0.01 %

## 2016-04-07 ENCOUNTER — Ambulatory Visit (INDEPENDENT_AMBULATORY_CARE_PROVIDER_SITE_OTHER): Payer: Medicare Other | Admitting: *Deleted

## 2016-04-07 DIAGNOSIS — I4891 Unspecified atrial fibrillation: Secondary | ICD-10-CM | POA: Diagnosis not present

## 2016-04-07 DIAGNOSIS — Z5181 Encounter for therapeutic drug level monitoring: Secondary | ICD-10-CM | POA: Diagnosis not present

## 2016-04-07 LAB — POCT INR: INR: 3.7

## 2016-04-08 ENCOUNTER — Ambulatory Visit (HOSPITAL_COMMUNITY): Payer: Medicare Other | Admitting: Certified Registered"

## 2016-04-08 ENCOUNTER — Ambulatory Visit (HOSPITAL_COMMUNITY): Payer: Medicare Other

## 2016-04-08 ENCOUNTER — Encounter (HOSPITAL_COMMUNITY): Payer: Self-pay | Admitting: Certified Registered"

## 2016-04-08 ENCOUNTER — Inpatient Hospital Stay (HOSPITAL_COMMUNITY)
Admission: RE | Admit: 2016-04-08 | Discharge: 2016-04-14 | DRG: 287 | Disposition: A | Discharge status: Home Health | Payer: Medicare Other | Source: Ambulatory Visit | Attending: Cardiology | Admitting: Cardiology

## 2016-04-08 ENCOUNTER — Encounter (HOSPITAL_COMMUNITY)
Admission: RE | Disposition: A | Discharge status: Home Health | Payer: Self-pay | Source: Ambulatory Visit | Attending: Cardiology

## 2016-04-08 DIAGNOSIS — N183 Chronic kidney disease, stage 3 (moderate): Secondary | ICD-10-CM | POA: Diagnosis not present

## 2016-04-08 DIAGNOSIS — Z79899 Other long term (current) drug therapy: Secondary | ICD-10-CM

## 2016-04-08 DIAGNOSIS — E119 Type 2 diabetes mellitus without complications: Secondary | ICD-10-CM | POA: Diagnosis present

## 2016-04-08 DIAGNOSIS — I5023 Acute on chronic systolic (congestive) heart failure: Secondary | ICD-10-CM | POA: Diagnosis not present

## 2016-04-08 DIAGNOSIS — R791 Abnormal coagulation profile: Secondary | ICD-10-CM | POA: Diagnosis present

## 2016-04-08 DIAGNOSIS — I4892 Unspecified atrial flutter: Secondary | ICD-10-CM | POA: Diagnosis not present

## 2016-04-08 DIAGNOSIS — I110 Hypertensive heart disease with heart failure: Secondary | ICD-10-CM | POA: Diagnosis not present

## 2016-04-08 DIAGNOSIS — I509 Heart failure, unspecified: Secondary | ICD-10-CM

## 2016-04-08 DIAGNOSIS — I82B21 Chronic embolism and thrombosis of right subclavian vein: Secondary | ICD-10-CM | POA: Diagnosis not present

## 2016-04-08 DIAGNOSIS — I340 Nonrheumatic mitral (valve) insufficiency: Secondary | ICD-10-CM | POA: Diagnosis not present

## 2016-04-08 DIAGNOSIS — I959 Hypotension, unspecified: Secondary | ICD-10-CM | POA: Diagnosis not present

## 2016-04-08 DIAGNOSIS — N179 Acute kidney failure, unspecified: Secondary | ICD-10-CM | POA: Diagnosis present

## 2016-04-08 DIAGNOSIS — Z6841 Body Mass Index (BMI) 40.0 and over, adult: Secondary | ICD-10-CM

## 2016-04-08 DIAGNOSIS — Z95810 Presence of automatic (implantable) cardiac defibrillator: Secondary | ICD-10-CM

## 2016-04-08 DIAGNOSIS — I4891 Unspecified atrial fibrillation: Secondary | ICD-10-CM | POA: Diagnosis not present

## 2016-04-08 DIAGNOSIS — I429 Cardiomyopathy, unspecified: Secondary | ICD-10-CM | POA: Diagnosis not present

## 2016-04-08 DIAGNOSIS — I472 Ventricular tachycardia: Secondary | ICD-10-CM | POA: Diagnosis not present

## 2016-04-08 DIAGNOSIS — Z7901 Long term (current) use of anticoagulants: Secondary | ICD-10-CM | POA: Diagnosis not present

## 2016-04-08 DIAGNOSIS — I513 Intracardiac thrombosis, not elsewhere classified: Secondary | ICD-10-CM | POA: Diagnosis not present

## 2016-04-08 DIAGNOSIS — I82B11 Acute embolism and thrombosis of right subclavian vein: Secondary | ICD-10-CM | POA: Diagnosis not present

## 2016-04-08 DIAGNOSIS — I8290 Acute embolism and thrombosis of unspecified vein: Secondary | ICD-10-CM | POA: Diagnosis not present

## 2016-04-08 DIAGNOSIS — I5021 Acute systolic (congestive) heart failure: Secondary | ICD-10-CM

## 2016-04-08 DIAGNOSIS — R0602 Shortness of breath: Secondary | ICD-10-CM | POA: Diagnosis not present

## 2016-04-08 DIAGNOSIS — G4730 Sleep apnea, unspecified: Secondary | ICD-10-CM | POA: Diagnosis present

## 2016-04-08 HISTORY — PX: TEE WITHOUT CARDIOVERSION: SHX5443

## 2016-04-08 LAB — COMPREHENSIVE METABOLIC PANEL
ALK PHOS: 130 U/L — AB (ref 38–126)
ALT: 90 U/L — AB (ref 14–54)
AST: 45 U/L — AB (ref 15–41)
Albumin: 2.8 g/dL — ABNORMAL LOW (ref 3.5–5.0)
Anion gap: 9 (ref 5–15)
BILIRUBIN TOTAL: 0.2 mg/dL — AB (ref 0.3–1.2)
BUN: 50 mg/dL — AB (ref 6–20)
CO2: 23 mmol/L (ref 22–32)
CREATININE: 1.8 mg/dL — AB (ref 0.44–1.00)
Calcium: 8.9 mg/dL (ref 8.9–10.3)
Chloride: 104 mmol/L (ref 101–111)
GFR calc Af Amer: 36 mL/min — ABNORMAL LOW (ref >60)
GFR, EST NON AFRICAN AMERICAN: 31 mL/min — AB (ref >60)
Glucose, Bld: 111 mg/dL — ABNORMAL HIGH (ref 65–99)
Potassium: 4.6 mmol/L (ref 3.5–5.1)
Sodium: 136 mmol/L (ref 135–145)
TOTAL PROTEIN: 5.7 g/dL — AB (ref 6.5–8.1)

## 2016-04-08 LAB — BASIC METABOLIC PANEL
ANION GAP: 11 (ref 5–15)
BUN: 50 mg/dL — ABNORMAL HIGH (ref 6–20)
CALCIUM: 9.3 mg/dL (ref 8.9–10.3)
CHLORIDE: 103 mmol/L (ref 101–111)
CO2: 23 mmol/L (ref 22–32)
CREATININE: 1.75 mg/dL — AB (ref 0.44–1.00)
GFR calc Af Amer: 37 mL/min — ABNORMAL LOW (ref >60)
GFR calc non Af Amer: 32 mL/min — ABNORMAL LOW (ref >60)
GLUCOSE: 122 mg/dL — AB (ref 65–99)
Potassium: 4.2 mmol/L (ref 3.5–5.1)
Sodium: 137 mmol/L (ref 135–145)

## 2016-04-08 LAB — POCT I-STAT 4, (NA,K, GLUC, HGB,HCT)
Glucose, Bld: 108 mg/dL — ABNORMAL HIGH (ref 65–99)
HCT: 34 % — ABNORMAL LOW (ref 36.0–46.0)
HEMOGLOBIN: 11.6 g/dL — AB (ref 12.0–15.0)
Potassium: 4.5 mmol/L (ref 3.5–5.1)
SODIUM: 137 mmol/L (ref 135–145)

## 2016-04-08 LAB — BRAIN NATRIURETIC PEPTIDE: B Natriuretic Peptide: 601.2 pg/mL — ABNORMAL HIGH (ref 0.0–100.0)

## 2016-04-08 LAB — PROTIME-INR
INR: 3.67
PROTHROMBIN TIME: 37.4 s — AB (ref 11.4–15.2)

## 2016-04-08 LAB — GLUCOSE, CAPILLARY: Glucose-Capillary: 104 mg/dL — ABNORMAL HIGH (ref 65–99)

## 2016-04-08 LAB — TSH: TSH: 5.137 u[IU]/mL — AB (ref 0.350–4.500)

## 2016-04-08 LAB — MAGNESIUM: Magnesium: 1.7 mg/dL (ref 1.7–2.4)

## 2016-04-08 LAB — MRSA PCR SCREENING: MRSA by PCR: NEGATIVE

## 2016-04-08 SURGERY — ECHOCARDIOGRAM, TRANSESOPHAGEAL
Anesthesia: Monitor Anesthesia Care

## 2016-04-08 MED ORDER — SODIUM CHLORIDE 0.9% FLUSH: 3.0000 mL | INTRAVENOUS | Status: DC | PRN

## 2016-04-08 MED ORDER — PROPOFOL 500 MG/50ML IV EMUL
INTRAVENOUS | Status: DC | PRN
  Administered 2016-04-08: 100 ug/kg/min via INTRAVENOUS

## 2016-04-08 MED ORDER — WARFARIN - PHARMACIST DOSING INPATIENT: Freq: Every day | Status: DC

## 2016-04-08 MED ORDER — PHENYLEPHRINE 40 MCG/ML (10ML) SYRINGE FOR IV PUSH (FOR BLOOD PRESSURE SUPPORT)
PREFILLED_SYRINGE | INTRAVENOUS | Status: DC | PRN
  Administered 2016-04-08: 80 ug via INTRAVENOUS
  Administered 2016-04-08: 120 ug via INTRAVENOUS
  Administered 2016-04-08 (×2): 80 ug via INTRAVENOUS

## 2016-04-08 MED ORDER — INSULIN ASPART 100 UNIT/ML ~~LOC~~ SOLN
0.0000 [IU] | Freq: Three times a day (TID) | SUBCUTANEOUS | Status: DC
  Administered 2016-04-09 – 2016-04-12 (×8): 3 [IU] via SUBCUTANEOUS
  Administered 2016-04-12: 4 [IU] via SUBCUTANEOUS
  Administered 2016-04-13: 3 [IU] via SUBCUTANEOUS
  Administered 2016-04-13: 4 [IU] via SUBCUTANEOUS
  Administered 2016-04-13: 3 [IU] via SUBCUTANEOUS

## 2016-04-08 MED ORDER — SODIUM CHLORIDE 0.9 % IV SOLN
INTRAVENOUS | Status: DC
Start: 2016-04-08 — End: 2016-04-08

## 2016-04-08 MED ORDER — FUROSEMIDE 10 MG/ML IJ SOLN
80.0000 mg | Freq: Two times a day (BID) | INTRAMUSCULAR | Status: DC
  Administered 2016-04-08 – 2016-04-10 (×4): 80 mg via INTRAVENOUS
  Filled 2016-04-08 (×4): qty 8

## 2016-04-08 MED ORDER — ACETAMINOPHEN 325 MG PO TABS
650.0000 mg | ORAL_TABLET | ORAL | Status: DC | PRN
  Filled 2016-04-08: qty 2

## 2016-04-08 MED ORDER — BUTAMBEN-TETRACAINE-BENZOCAINE 2-2-14 % EX AERO
INHALATION_SPRAY | CUTANEOUS | Status: DC | PRN
  Administered 2016-04-08: 2 via TOPICAL

## 2016-04-08 MED ORDER — NOREPINEPHRINE BITARTRATE 1 MG/ML IV SOLN
0.0000 ug/min | INTRAVENOUS | Status: DC
  Filled 2016-04-08 (×2): qty 4

## 2016-04-08 MED ORDER — SODIUM CHLORIDE 0.9 % IV SOLN: INTRAVENOUS | Status: DC

## 2016-04-08 MED ORDER — LIDOCAINE 2% (20 MG/ML) 5 ML SYRINGE
INTRAMUSCULAR | Status: DC | PRN
  Administered 2016-04-08: 60 mg via INTRAVENOUS

## 2016-04-08 MED ORDER — SODIUM CHLORIDE 0.9% FLUSH
3.0000 mL | Freq: Two times a day (BID) | INTRAVENOUS | Status: DC
  Administered 2016-04-08: 6 mL via INTRAVENOUS
  Administered 2016-04-09 – 2016-04-13 (×6): 3 mL via INTRAVENOUS

## 2016-04-08 MED ORDER — ASPIRIN EC 81 MG PO TBEC
81.0000 mg | DELAYED_RELEASE_TABLET | Freq: Every day | ORAL | Status: DC
  Administered 2016-04-09 – 2016-04-10 (×2): 81 mg via ORAL
  Filled 2016-04-08 (×2): qty 1

## 2016-04-08 MED ORDER — SODIUM CHLORIDE 0.9 % IV SOLN: 250.0000 mL | INTRAVENOUS | Status: DC | PRN

## 2016-04-08 MED ORDER — LACTATED RINGERS IV SOLN
INTRAVENOUS | Status: DC
  Administered 2016-04-08: 1000 mL via INTRAVENOUS

## 2016-04-08 MED ORDER — ONDANSETRON HCL 4 MG/2ML IJ SOLN: 4.0000 mg | Freq: Four times a day (QID) | INTRAMUSCULAR | Status: DC | PRN

## 2016-04-08 NOTE — Progress Notes (Signed)
Consent obtained for PICC placement. Attempt x 1 with successful cannulation but unable to thread PICC past AICD wires coming from L side at [facility]. Willene Hatchet RN notified to contact MD for IR placement in AM.

## 2016-04-08 NOTE — Progress Notes (Signed)
  Echocardiogram Echocardiogram Transesophageal has been performed.  Donata Clay 04/08/2016, 3:41 PM

## 2016-04-08 NOTE — H&P (Signed)
Advanced Heart Failure Team History and Physical Note   Primary Physician:   Primary Cardiologist:  Dr Percival Spanish   Reason for Admission: A/C Systolic Heart Failure    HPI:    Ms Hockley is a 51 year old with long standing history of post partum cardiomyopathy dating back to 1996. At one point EF improved to 45% from 20%.  She also has a history VT with Medtronic ICD placed. Most recent [place] around 2001 in [place] with no CAD. Three weeks ago she was  admitted to [facility] with new onset A Fib RVR and volume overload. Required IV diuresis and cardizem drip. Unfortunately she remained in A fib. She was discharged on 20 mg po lasix daily. Warfarin was started that admission and appears to have been therapeutic to supratherapeutic for the last couple of weeks.   On December 27th she saw Dr Percival Spanish and she was set up TEE/DCCV. Creatinine at that time was 2.03.  Functional decline over the last 3 weeks. SOB after a few steps. Dyspnea at rest. Weight at home has been trending up from 290 to 309 pounds.  SBP in 90s at Dr. Rosezella [place] office.   Today she presented for scheduled TEE/DC-CV however thrombus was noted in LA appendage so DC-CV was not pursued. TEE also showed severe LV dysfunction. Dyspneic at rest. SBP has been soft.  She took all her meds including lisinopril 40 daily and metoprolol 100 mg today.  She is in atrial flutter with controlled rate in the 70s.   Review of Systems: [y] = yes, [ ]  = no   General: Weight gain [Y ]; Weight loss [ ] ; Anorexia [ ] ; Fatigue [ Y]; Fever [ ] ; Chills [ ] ; Weakness [ Y]  Cardiac: Chest pain/pressure [ ] ; Resting SOB [Y ]; Exertional SOB [Y ]; Orthopnea [Y ]; Pedal Edema [Y ]; Palpitations [ ] ; Syncope [ ] ; Presyncope [ ] ; Paroxysmal nocturnal dyspnea[ ]   Pulmonary: Cough [ ] ; Wheezing[ ] ; Hemoptysis[ ] ; Sputum [ ] ; Snoring [ ]   GI: Vomiting[ ] ; Dysphagia[ ] ; Melena[ ] ; Hematochezia [ ] ; Heartburn[ ] ; Abdominal pain [ ] ; Constipation [ ] ; Diarrhea  [ ] ; BRBPR [ ]   GU: Hematuria[ ] ; Dysuria [ ] ; Nocturia[ ]   Vascular: Pain in legs with walking [ ] ; Pain in feet with lying flat [ ] ; Non-healing sores [ ] ; Stroke [ ] ; TIA [ ] ; Slurred speech [ ] ;  Neuro: Headaches[ ] ; Vertigo[ ] ; Seizures[ ] ; Paresthesias[ ] ;Blurred vision [ ] ; Diplopia [ ] ; Vision changes [ ]   Ortho/Skin: Arthritis [ ] ; Joint pain [Y ]; Muscle pain [ ] ; Joint swelling [ ] ; Back Pain [Y ]; Rash [ ]   Psych: Depression[ ] ; Anxiety[ ]   Heme: Bleeding problems [ ] ; Clotting disorders [ ] ; Anemia [ ]   Endocrine: Diabetes [Y ]; Thyroid dysfunction[ ]    Home Medications Prior to Admission medications   Medication Sig Start Date End Date Taking? Authorizing Provider  allopurinol (ZYLOPRIM) 300 MG tablet Take 300 mg by mouth daily.    Yes Historical Provider, MD  amiodarone (PACERONE) 200 MG tablet Take 200 mg by mouth 2 (two) times daily.   Yes Historical Provider, MD  furosemide (LASIX) 20 MG tablet Take 20 mg by mouth 2 (two) times daily.   Yes Historical Provider, MD  lisinopril (PRINIVIL,ZESTRIL) 40 MG tablet Take 40 mg by mouth daily.   Yes Historical Provider, MD  metFORMIN (GLUCOPHAGE) 500 MG tablet Take 500 mg by mouth 2 (two) times daily  with a meal.   Yes Historical Provider, MD  metoprolol (LOPRESSOR) 100 MG tablet Take 100 mg by mouth 2 (two) times daily.   Yes Historical Provider, MD  Multiple Vitamin (MULTIVITAMIN) tablet Take 1 tablet by mouth daily.   Yes Historical Provider, MD  potassium chloride SA (K-DUR,KLOR-CON) 20 MEQ tablet Take 1 tablet (20 mEq total) by mouth daily. 03/07/16  Yes Minus Breeding, MD  warfarin (COUMADIN) 10 MG tablet Take 10 mg by mouth daily at 6 PM.   Yes Historical Provider, MD  carvedilol (COREG) 25 MG tablet Take 1 tablet (25 mg total) by mouth 2 (two) times daily with a meal. 12/28/15   Thompson Grayer, MD  Fish Oil-Cholecalciferol (FISH OIL + D3 PO) Take 1 capsule by mouth daily.    Historical Provider, MD    Past Medical History: Past  Medical History:  Diagnosis Date  . AICD (automatic cardioverter/defibrillator) present 2002, 2010   secondary to post-partum cardiomyopathy (1996)  . CHF (congestive heart failure) ([place])   . Diabetes mellitus without complication ([place])   . Hypertension   . Hypertensive cardiovascular disease   . Obesity   . Postpartum cardiomyopathy    Last echocardiogram was last year and EF 45 -50 %  . Seasonal allergies   . Sleep apnea    complaint with CPAP  . Ventricular tachycardia (HCC)    s/p ICD Implantation (MDT Secura VR)    Past Surgical History: Past Surgical History:  Procedure Laterality Date  . CARDIAC DEFIBRILLATOR PLACEMENT  08/12/2000, 11/11/2008   Most recent Generator MDT Secura VR placed in [place]  . CESAREAN SECTION  1996  . CHOLECYSTECTOMY  2004  . IR GENERIC HISTORICAL  06/13/2014   IR RADIOLOGIST EVAL & MGMT 06/13/2014 Greggory Keen, MD [facility] INTERV RAD    Family History: Family History  Problem Relation Age of Onset  . Other Mother     has chronic pain syndrome from DDD with spinal cord stimulator  . Hypertension Mother   . COPD Father   . Arthritis/Rheumatoid Father     Social History: Social History   Social History  . Marital status: Divorced    Spouse name: N/A  . Number of children: N/A  . Years of education: N/A   Social History Main Topics  . Smoking status: Never Smoker  . Smokeless tobacco: Never Used  . Alcohol use No  . Drug use: No  . Sexual activity: Not Asked   Other Topics Concern  . None   Social History Narrative   Lives with her parents and 51 year old daughter in [place].    Allergies:  Allergies  Allergen Reactions  . Morphine And Related Nausea Only and Other (See Comments)    Feels sick,and like she is going to pass out; can't breathe, starts sweating    Objective:    Vital Signs:   Temp:  [97.8 F (36.6 C)] 97.8 F (36.6 C) (01/09 1308) Pulse Rate:  [73] 73 (01/09 1447) Resp:  [11-15] 11 (01/09  1447) BP: (79-97)/(50-62) 79/55 (01/09 1500) SpO2:  [96 %-99 %] 96 % (01/09 1447)   There were no vitals filed for this visit.  Physical Exam: General:  Pale. Dyspneic at rest.  HEENT: normal Neck: Thick, supple. JVP to jaw. Carotids 2+ bilat; no bruits. No lymphadenopathy or thyromegaly appreciated. Cor: PMI nondisplaced. Regular rate & rhythm. No rubs, gallops or murmurs. Lungs: clear Abdomen: soft, nontender, nondistended. No hepatosplenomegaly. No bruits or masses. Good bowel sounds.  Extremities: no cyanosis, clubbing, rash, R and LLE 1-2 +edema Neuro: alert & oriented x 3, cranial nerves grossly intact. moves all 4 extremities w/o difficulty. Affect pleasant.  Telemetry:  A fib/flutter   Labs: Basic Metabolic Panel:  Recent Labs Lab 04/08/16 1330  NA 137  K 4.5  GLUCOSE 108*    Liver Function Tests: No results for input(s): AST, ALT, ALKPHOS, BILITOT, PROT, ALBUMIN in the last 168 hours. No results for input(s): LIPASE, AMYLASE in the last 168 hours. No results for input(s): AMMONIA in the last 168 hours.  CBC:  Recent Labs Lab 04/08/16 1330  HGB 11.6*  HCT 34.0*    Cardiac Enzymes: No results for input(s): CKTOTAL, CKMB, CKMBINDEX, TROPONINI in the last 168 hours.  BNP: BNP (last 3 results) No results for input(s): BNP in the last 8760 hours.  ProBNP (last 3 results) No results for input(s): PROBNP in the last 8760 hours.   CBG: No results for input(s): GLUCAP in the last 168 hours.  Coagulation Studies:  Recent Labs  04/07/16 1025  INR 3.7    Other results:   Imaging:  No results found.   Assessment/Plan  Ms Monette is a 51 year old admitted with A/C systolic heart failure, A fib/flutter, and new LAA clot noted on TEE. She has marked volume overload and hypotension. Admit to ICU due to hypotension.   1. A/C Systolic Heart Failure - Post Partum cardiomyopathy 1996. [place] 2001 no CAD. ICD Medtronic. TEE LVEF < 20%. Marked volume  overload. Diurese with 80 mg IV lasix twice a day. SBP soft. No bb for now due to suspected low output. No Ace/ARNi/spiro due to hypotension and recent rise in creatinine. Will also need CMET now to evaluate renal function. No digoxin for now.  Will need to place PICC for CO-OX and CVP to guide diuresis. May need to add norepi to maintain SBP > 85. Prior to admit she had high dose metoprolol and lisinopril so hopefully BP will improve.  2. A Fib/Flutter- rate controlled. Hold on amio drip. She had oral amio this morning so we can address in the morning. Unable to complete DC-CV due to LAA clot. Continue coumadin. Pharmacy to dose coumadin. Todays INR 3.7 Add 81 mg aspirin due to newly noted LAA clot.  3. LAA Clot- on coumadin as above.  4. H/O VT-keep K .4 and Mag >2.  5. DMII- hold metformin with recently elevated creatinine. Place on sliding scale. Check Hgb A1C.  6. Morbid Obesity  Low voltage noted on monitor. Check SPEP/UPEP.  Length of Stay: 0   Darrick Grinder, NP 04/08/2016, 3:54 PM  Advanced Heart Failure Team Pager [phone] (M-F; 7a - 4p)  Please contact [facility] for night-coverage after hours (4p -7a ) and weekends on [email or web address]  Patient seen with NP, agree with the above note.  1. Acute on chronic systolic CHF: EF < 123456 on TEE today.  Nonischemic cardiomyopathy, known since 45.  Possibly peri-partum.  Cath around 2001 in [place] reportedly showed no coronary disease.  Has Medtronic ICD.  On exam, she is volume overloaded and SBP soft in 80s.  She has felt progressively worse over the last few weeks.  Concern for low output HF.  - Will admit to CCU, place central access => plan PICC with elevated INR.  Follow CVP and co-ox.  If co-ox low, will use milrinone +/- norepinephrine (suspect she will require).  - Needs diuresis => Lasix 80 mg IV bid to start.   -  Hold home ACEI and metoprolol.  Got doses this morning, hopefully BP will improve as these meds wear out.  - Low  voltage 2. Atrial flutter: Controlled rate.  Just started warfarin about 3 weeks ago, does have LA appendage thrombus.  - Cannot cardiovert at this point.  - Can continue amiodarone to load in anticipation of future DCCV but decrease to 200 mg daily.  - Continue warfarin and maintain therapeutic INR.  Will add ASA 81 for now with thrombus.  - Repeat TEE in about a month to see if thrombus has resolved with prolonged warfarin use.  3. AKI: Creatinine up to 2 when last checked in 12/17.  May be due to low output/low BP/continue use of high dose lisinopril.   - BMET today.  - Stopping lisinopril.  - May need inotrope.   Loralie Champagne 04/08/2016 4:45 PM

## 2016-04-08 NOTE — Progress Notes (Addendum)
ANTICOAGULATION CONSULT NOTE - Initial Consult  Pharmacy Consult for Coumadin Indication: atrial fibrillation  Allergies  Allergen Reactions  . Morphine And Related Nausea Only and Other (See Comments)    Feels sick,and like she is going to pass out; can't breathe, starts sweating    Patient Measurements:    Vital Signs: Temp: 97.8 F (36.6 C) (01/09 1308) Temp Source: Oral (01/09 1308) BP: 92/57 (01/09 1651) Pulse Rate: 80 (01/09 1550)  Labs:  Recent Labs  04/07/16 1025 04/08/16 1330 04/08/16 1600  HGB  --  11.6*  --   HCT  --  34.0*  --   INR 3.7  --   --   CREATININE  --   --  1.80*    Estimated Creatinine Clearance: 53.1 mL/min (by C-G formula based on SCr of 1.8 mg/dL (H)).   Medical History: Past Medical History:  Diagnosis Date  . AICD (automatic cardioverter/defibrillator) present 2002, 2010   secondary to post-partum cardiomyopathy (1996)  . CHF (congestive heart failure) ([place])   . Diabetes mellitus without complication ([place])   . Hypertension   . Hypertensive cardiovascular disease   . Obesity   . Postpartum cardiomyopathy    Last echocardiogram was last year and EF 45 -50 %  . Seasonal allergies   . Sleep apnea    complaint with CPAP  . Ventricular tachycardia (HCC)    s/p ICD Implantation (MDT Secura VR)    Assessment: 51 year old female with atrial fibrillation diagnosed 3 weeks ago on admission at [facility] and Coumadin was started that admission. Patient now admitted 04/08/2016 to the ICU with hypotension and marked volume overload. Plan was for DCCV but unable due to left atrial appendage clot found on TEE prior to procedure. Pharmacy consult to continue Coumadin for LAA clot and atrial fibrillation.   INR 04/07/16 was elevated at 3.7 - elevated for INR goal of 2-3 per last Anti-coag visit note from [place].  Home Coumadin dose was 10 mg daily at 6 PM- last dose was 04/07/16, patient was told to take 5mg  last night then resume 10mg  daily  today.   Goal of Therapy:  INR 2-3 Monitor platelets by anticoagulation protocol: Yes   Plan:  Recheck INR today before further Coumadin  Sloan Leiter, PharmD, BCPS Clinical Pharmacist Clinical Phone 04/08/2016 until 11 PM - [phone] After hours, please call [phone] 04/08/2016,5:06 PM   Addendum: INR on recheck remains elevated at 3.67 after patient took 1/2 dose yesterday.   Plan: Hold Coumadin tonight.

## 2016-04-08 NOTE — CV Procedure (Signed)
TRANSESOPHAGEAL ECHOCARDIOGRAM (TEE) NOTE  INDICATIONS: atrial fibrillation  PROCEDURE:   Informed consent was obtained prior to the procedure. The risks, benefits and alternatives for the procedure were discussed and the patient comprehended these risks.  Risks include, but are not limited to, cough, sore throat, vomiting, nausea, somnolence, esophageal and stomach trauma or perforation, bleeding, low blood pressure, aspiration, pneumonia, infection, trauma to the teeth and death.    After a procedural time-out, the patient was given Propofol per anesthesia for sedation.  The patient's heart rate, blood pressure, and oxygen saturation are monitored continuously during the procedure.The oropharynx was anesthetized 2 cetacaine sprays.  The transesophageal probe was inserted in the esophagus and stomach without difficulty and multiple views were obtained.  The patient was kept under observation until the patient left the procedure room. The patient left the procedure room in stable condition.   Agitated microbubble saline contrast was administered.  COMPLICATIONS:    There were no immediate complications.  Findings:  1. LEFT VENTRICLE: The left ventricular wall thickness is normal.  The left ventricular cavity is severely dilated in size. Wall motion is globally hypokinetic.  LVEF is <20%.  2. RIGHT VENTRICLE:  The right ventricle is normal in structure and function without any thrombus or masses.    3. LEFT ATRIUM:  The left atrium is severely dilated in size without any thrombus or masses.  There is spontaneous echo contrast ("smoke") in the left atrium consistent with a low flow state.  4. LEFT ATRIAL APPENDAGE:  The left atrial appendage is noted to have a 1 x 1.4 cm thrombus with heavy smoke and poor emptying velocities. The appendage has single lobes. Pulse doppler indicates very low flow in the appendage.  5. ATRIAL SEPTUM:  The atrial septum appears intact and is free of  thrombus and/or masses.  There is no evidence for interatrial shunting by color doppler and saline microbubble.  6. RIGHT ATRIUM:  The right atrium is moderately dilated. Smoke is noted in the right atrium.  7. MITRAL VALVE:  The mitral valve is normal in structure and function with mild to moderate functional regurgitation.  There were no vegetations or stenosis.  8. AORTIC VALVE:  The aortic valve is trileaflet, normal in structure and function with no regurgitation.  There were no vegetations or stenosis  9. TRICUSPID VALVE:  The tricuspid valve is normal in structure and function with moderate to severe regurgitation.  There were no vegetations or stenosis. An AICD wire is noted without thrombus.  10.  PULMONIC VALVE:  The pulmonic valve is normal in structure and function with trivial regurgitation.  There were no vegetations or stenosis.   11. AORTIC ARCH, ASCENDING AND DESCENDING AORTA:  Poorly visualized.  12. PULMONARY VEINS: Anomalous pulmonary venous return was not noted.  13. PERICARDIUM: The pericardium appeared normal and non-thickened.  There is no pericardial effusion.  IMPRESSION:   1. Large LAA thrombus with heavy smoke in the left atrium. 2. Dilated cardiomyopathy with LVEF of <20% 3. Severe biatrial enlargment 4. Mild to moderate functional MR 5. Moderate to severe TR 6. AICD wire noted without thrombus 7. Negative for PFO by saline microbubble contrast  RECOMMENDATIONS:    1. DCCV was not performed due to large thrombus noted in the LAA. The patient is anticoagulated on warfarin. Will need either 1 additional month of anticoagulation and a repeat TEE (with anesthesia) or consider switching to a NOAC with at least 3 weeks of anticoagulation and repeat TEE (  with anesthesia) at that time if cardioversion is desired. Given her severe biatrial enlargement, restoration of sinus rhythm may be difficult without antiarrythmic therapy.  Time Spent Directly with the  Patient:  60 minutes   Pixie Casino, MD, [facility] Attending Cardiologist [facility]  04/08/2016, 2:53 PM

## 2016-04-08 NOTE — H&P (Signed)
    INTERVAL PROCEDURE H&P  History and Physical Interval Note:  04/08/2016 1:32 PM  Erin Jarvis has presented today for their planned procedure. The various methods of treatment have been discussed with the patient and family. After consideration of risks, benefits and other options for treatment, the patient has consented to the procedure.  The patients' outpatient history has been reviewed, patient examined, and no change in status from most recent office note within the past 30 days. I have reviewed the patients' chart and labs and will proceed as planned. Questions were answered to the patient's satisfaction.   Pixie Casino, MD, [facility] Attending Cardiologist [facility]  Erin Jarvis Erin Jarvis 04/08/2016, 1:32 PM

## 2016-04-08 NOTE — Anesthesia Postprocedure Evaluation (Signed)
Anesthesia Post Note  Patient: Erin Jarvis  Procedure(s) Performed: Procedure(s) (LRB): TRANSESOPHAGEAL ECHOCARDIOGRAM (TEE) (N/A)  Patient location during evaluation: PACU Anesthesia Type: MAC Level of consciousness: awake and alert Pain management: pain level controlled Vital Signs Assessment: post-procedure vital signs reviewed and stable Respiratory status: spontaneous breathing, nonlabored ventilation, respiratory function stable and patient connected to nasal cannula oxygen Cardiovascular status: stable and blood pressure returned to baseline Anesthetic complications: no       Last Vitals:  Vitals:   04/08/16 1900 04/08/16 2015  BP: 98/63 (!) 80/52  Pulse: 82 90  Resp: 19 14  Temp: 36.8 C     Last Pain:  Vitals:   04/08/16 1900  TempSrc: Oral                 Niki Payment,W. EDMOND

## 2016-04-08 NOTE — Anesthesia Preprocedure Evaluation (Addendum)
Anesthesia Evaluation  Patient identified by MRN, date of birth, ID band Patient awake    Reviewed: Allergy & Precautions, H&P , NPO status , Patient's Chart, lab work & pertinent test results, reviewed documented beta blocker date and time   Airway Mallampati: III  TM Distance: >3 FB Neck ROM: Full    Dental no notable dental hx. (+) Teeth Intact, Dental Advisory Given   Pulmonary sleep apnea ,    Pulmonary exam normal breath sounds clear to auscultation       Cardiovascular hypertension, Pt. on medications and Pt. on home beta blockers +CHF  + Cardiac Defibrillator  Rhythm:Irregular Rate:Normal     Neuro/Psych negative neurological ROS  negative psych ROS   GI/Hepatic negative GI ROS, Neg liver ROS,   Endo/Other  diabetes, Type 2, Oral Hypoglycemic AgentsMorbid obesity  Renal/GU negative Renal ROS  negative genitourinary   Musculoskeletal   Abdominal   Peds  Hematology negative hematology ROS (+)   Anesthesia Other Findings   Reproductive/Obstetrics negative OB ROS                            Anesthesia Physical Anesthesia Plan  ASA: III  Anesthesia Plan: General   Post-op Pain Management:    Induction: Intravenous  Airway Management Planned: Nasal Cannula  Additional Equipment:   Intra-op Plan:   Post-operative Plan:   Informed Consent: I have reviewed the patients History and Physical, chart, labs and discussed the procedure including the risks, benefits and alternatives for the proposed anesthesia with the patient or authorized representative who has indicated his/her understanding and acceptance.   Dental advisory given  Plan Discussed with: CRNA  Anesthesia Plan Comments:         Anesthesia Quick Evaluation

## 2016-04-08 NOTE — Transfer of Care (Signed)
Immediate Anesthesia Transfer of Care Note  Patient: Erin Jarvis  Procedure(s) Performed: Procedure(s): TRANSESOPHAGEAL ECHOCARDIOGRAM (TEE) (N/A) CARDIOVERSION (N/A)  Patient Location: Endoscopy Unit  Anesthesia Type:MAC  Level of Consciousness: awake, alert , oriented and patient cooperative  Airway & Oxygen Therapy: Patient Spontanous Breathing and Patient connected to nasal cannula oxygen  Post-op Assessment: Report given to RN, Post -op Vital signs reviewed and stable and Patient moving all extremities  Post vital signs: Reviewed and stable  Last Vitals:  Vitals:   04/08/16 1311 04/08/16 1447  BP: 97/62 (!) 91/50  Pulse:  73  Resp:  11  Temp:      Last Pain:  Vitals:   04/08/16 1308  TempSrc: Oral         Complications: No apparent anesthesia complications

## 2016-04-09 ENCOUNTER — Inpatient Hospital Stay (HOSPITAL_COMMUNITY): Payer: Medicare Other

## 2016-04-09 ENCOUNTER — Encounter (HOSPITAL_COMMUNITY): Payer: Self-pay | Admitting: Interventional Radiology

## 2016-04-09 DIAGNOSIS — I509 Heart failure, unspecified: Secondary | ICD-10-CM

## 2016-04-09 DIAGNOSIS — N183 Chronic kidney disease, stage 3 (moderate): Secondary | ICD-10-CM

## 2016-04-09 HISTORY — PX: IR GENERIC HISTORICAL: IMG1180011

## 2016-04-09 LAB — PROTIME-INR
INR: 4.27
Prothrombin Time: 42.2 seconds — ABNORMAL HIGH (ref 11.4–15.2)

## 2016-04-09 LAB — GLUCOSE, CAPILLARY
GLUCOSE-CAPILLARY: 145 mg/dL — AB (ref 65–99)
Glucose-Capillary: 167 mg/dL — ABNORMAL HIGH (ref 65–99)
Glucose-Capillary: 108 mg/dL — ABNORMAL HIGH (ref 65–99)
Glucose-Capillary: 140 mg/dL — ABNORMAL HIGH (ref 65–99)
Glucose-Capillary: 146 mg/dL — ABNORMAL HIGH (ref 65–99)

## 2016-04-09 LAB — BASIC METABOLIC PANEL
Anion gap: 10 (ref 5–15)
BUN: 51 mg/dL — ABNORMAL HIGH (ref 6–20)
CO2: 24 mmol/L (ref 22–32)
CREATININE: 1.9 mg/dL — AB (ref 0.44–1.00)
Calcium: 9 mg/dL (ref 8.9–10.3)
Chloride: 103 mmol/L (ref 101–111)
GFR calc Af Amer: 34 mL/min — ABNORMAL LOW (ref >60)
GFR, EST NON AFRICAN AMERICAN: 29 mL/min — AB (ref >60)
Glucose, Bld: 124 mg/dL — ABNORMAL HIGH (ref 65–99)
POTASSIUM: 4.4 mmol/L (ref 3.5–5.1)
SODIUM: 137 mmol/L (ref 135–145)

## 2016-04-09 LAB — COOXEMETRY PANEL
Carboxyhemoglobin: 1.1 % (ref 0.5–1.5)
Methemoglobin: 0.9 % (ref 0.0–1.5)
O2 Saturation: 54.8 %
TOTAL HEMOGLOBIN: 10.3 g/dL — AB (ref 12.0–16.0)

## 2016-04-09 LAB — ECHOCARDIOGRAM COMPLETE
Height: 67 in
Weight: 5044.12 oz

## 2016-04-09 MED ORDER — METOLAZONE 2.5 MG PO TABS
2.5000 mg | ORAL_TABLET | Freq: Once | ORAL | Status: AC
  Administered 2016-04-09: 2.5 mg via ORAL
  Filled 2016-04-09: qty 1

## 2016-04-09 MED ORDER — IOPAMIDOL (ISOVUE-300) INJECTION 61%
INTRAVENOUS | Status: AC
  Administered 2016-04-09: 10 mL
  Filled 2016-04-09: qty 50

## 2016-04-09 MED ORDER — LIDOCAINE HCL (PF) 1 % IJ SOLN
INTRAMUSCULAR | Status: AC
  Filled 2016-04-09: qty 10

## 2016-04-09 MED ORDER — AMIODARONE HCL 200 MG PO TABS
200.0000 mg | ORAL_TABLET | Freq: Every day | ORAL | Status: DC
  Administered 2016-04-09: 200 mg via ORAL
  Filled 2016-04-09: qty 1

## 2016-04-09 MED ORDER — LIDOCAINE HCL 1 % IJ SOLN
INTRAMUSCULAR | Status: DC | PRN
  Administered 2016-04-09: 10 mL

## 2016-04-09 MED ORDER — PERFLUTREN LIPID MICROSPHERE
1.0000 mL | INTRAVENOUS | Status: AC | PRN
  Administered 2016-04-09: 2 mL via INTRAVENOUS
  Filled 2016-04-09: qty 10

## 2016-04-09 MED ORDER — MILRINONE LACTATE IN DEXTROSE 20-5 MG/100ML-% IV SOLN
0.2500 ug/kg/min | INTRAVENOUS | Status: DC
  Administered 2016-04-09 – 2016-04-10 (×3): 0.25 ug/kg/min via INTRAVENOUS
  Filled 2016-04-09 (×3): qty 100

## 2016-04-09 MED ORDER — MAGNESIUM SULFATE 2 GM/50ML IV SOLN
2.0000 g | Freq: Once | INTRAVENOUS | Status: AC
  Administered 2016-04-09: 2 g via INTRAVENOUS
  Filled 2016-04-09: qty 50

## 2016-04-09 NOTE — Progress Notes (Signed)
Advanced Heart Failure Rounding Note   Subjective:   Admitted with marked volume overload from Endo with marked volume overload, LAA clot, and A fib. TEE EF ~20%.   Diuresing with IV lasix. Weight pending. Sluggish diuresis. IV team unable to place PICC.   Mild dyspnea with exertion.   Objective:   Weight Range:  Vital Signs:   Temp:  [97.8 F (36.6 C)-98.4 F (36.9 C)] 98.4 F (36.9 C) (01/10 0300) Pulse Rate:  [66-91] 86 (01/10 0700) Resp:  [11-22] 13 (01/10 0700) BP: (70-100)/(47-83) 93/67 (01/10 0700) SpO2:  [88 %-100 %] 93 % (01/10 0700) Weight:  [315 lb 4.1 oz (143 kg)] 315 lb 4.1 oz (143 kg) (01/09 1715) Last BM Date: 04/07/16  Weight change: Filed Weights   04/08/16 1715  Weight: (!) 315 lb 4.1 oz (143 kg)    Intake/Output:   Intake/Output Summary (Last 24 hours) at 04/09/16 0709 Last data filed at 04/09/16 0200  Gross per 24 hour  Intake             1162 ml  Output             2050 ml  Net             -888 ml    Physical Exam: General:  Pale. Dyspneic at rest. In bed.  HEENT: normal Neck: Thick, supple. JVP to jaw. Carotids 2+ bilat; no bruits. No lymphadenopathy or thyromegaly appreciated. Cor: PMI nondisplaced. Regular rate & rhythm. No rubs, gallops or murmurs. Lungs: clear Abdomen: soft, nontender, nondistended. No hepatosplenomegaly. No bruits or masses. Good bowel sounds. Extremities: no cyanosis, clubbing, rash, R and LLE 2 +edema Neuro: alert & oriented x 3, cranial nerves grossly intact. moves all 4 extremities w/o difficulty. Affect pleasant.  Telemetry:  A fib/flutter 90s Labs: Basic Metabolic Panel:  Recent Labs Lab 04/08/16 1330 04/08/16 1600 04/08/16 1834 04/09/16 0206  NA 137 136 137 137  K 4.5 4.6 4.2 4.4  CL  --  104 103 103  CO2  --  23 23 24   GLUCOSE 108* 111* 122* 124*  BUN  --  50* 50* 51*  CREATININE  --  1.80* 1.75* 1.90*  CALCIUM  --  8.9 9.3 9.0  MG  --  1.7  --   --     Liver Function Tests:  Recent  Labs Lab 04/08/16 1600  AST 45*  ALT 90*  ALKPHOS 130*  BILITOT 0.2*  PROT 5.7*  ALBUMIN 2.8*   No results for input(s): LIPASE, AMYLASE in the last 168 hours. No results for input(s): AMMONIA in the last 168 hours.  CBC:  Recent Labs Lab 04/08/16 1330  HGB 11.6*  HCT 34.0*    Cardiac Enzymes: No results for input(s): CKTOTAL, CKMB, CKMBINDEX, TROPONINI in the last 168 hours.  BNP: BNP (last 3 results)  Recent Labs  04/08/16 1834  BNP 601.2*    ProBNP (last 3 results) No results for input(s): PROBNP in the last 8760 hours.    Other results:  Imaging:  No results found.   Medications:     Scheduled Medications: . aspirin EC  81 mg Oral Daily  . furosemide  80 mg Intravenous BID  . insulin aspart  0-20 Units Subcutaneous TID WC  . sodium chloride flush  3 mL Intravenous Q12H  . Warfarin - Pharmacist Dosing Inpatient   Does not apply q1800     Infusions: . norepinephrine (LEVOPHED) Adult infusion  PRN Medications:  sodium chloride, acetaminophen, ondansetron (ZOFRAN) IV, sodium chloride flush   Assessment/Plan   Ms Klenke is a 51 year old admitted with A/C systolic heart failure, A fib/flutter, and new LAA clot noted on TEE. She has marked volume overload and hypotension. Admit to ICU due to hypotension.   1. A/C Systolic Heart Failure - Post Partum cardiomyopathy 1996. [place] 2001 no CAD. ICD Medtronic. TEE LVEF < 20%. Low voltage noted on monitor. Check SPEP/UPEP pending.   Marked volume overload. Diurese with 80 mg IV lasix twice a day. Add 2.5 mg metolazone x1.  SBP soft. No bb for now due to suspected low output. No Ace/ARNi/spiro due to hypotension.  Watch renal function closely.  Creatinine 1.9. No digoxin for now.  Will ask IR to place PICC for CO-OX and CVP. IV team unable to place. May need inotropes based on CO-OX results.   May need to add norepi to maintain SBP > 85.   2. A Fib/Flutter- rate controlled. Hold on amio drip.  Continue amio 200 mg daily Rate controlled.  Unable to complete DC-CV due to LAA clot. Continue coumadin. Pharmacy to dose coumadin. Todays INR 4.27.  3. LAA Clot- on coumadin as above.  4. H/O VT-keep K .4 and Mag >2. Mag 1.7. Will give 2 grams mag.  5. DMII- hold metformin with recently elevated creatinine. Place on sliding scale.  Hgb A1C pending.  6. Morbid Obesity 7. TSH - elevated 5.137. Check T3 T4.   Length of Stay: 1  Amy Clegg NP-C  04/09/2016, 7:09 AM  Advanced Heart Failure Team Pager [phone] (M-F; 7a - 4p)  Please contact [facility] for night-coverage after hours (4p -7a ) and weekends on [email or web address]  1. Acute on chronic systolic CHF: EF < 123456 on TEE.  Nonischemic cardiomyopathy, known since 60.  Possibly peri-partum.  Cath around 2001 in [place] reportedly showed no coronary disease.  Has Medtronic ICD.  On exam, she is volume overloaded and SBP soft in 90s.  She has felt progressively worse over the last few weeks.  Concern for low output HF.  She is volume overloaded on exam, some diuresis yesterday.  - Place central access => plan PICC with elevated INR, will need IR as IV team unable.  Follow CVP and co-ox.  If co-ox low, will use milrinone (suspect she will require).  - Continue diuretics, will add metolazone today.  - Hold home ACEI and metoprolol.   - Low voltage ECG, sent SPEP.  - RHC/LHC when more diuresed.  2. Atrial flutter: Controlled rate.  Just started warfarin about 3 weeks ago, does have LA appendage thrombus.  - Cannot cardiovert at this point.  - Can continue amiodarone to load in anticipation of future DCCV but decrease to 200 mg daily.  - Continue warfarin and maintain therapeutic INR (aim for 2.5-3 for now given known thrombus).   - Repeat TEE in about a month to see if thrombus has resolved with prolonged warfarin use.  3. AKI: Creatinine fairly stably elevated, was 2 in 12/17.  May be due to low output/low BP/continue use of high dose  lisinopril.    - Stopped lisinopril.  - May need inotrope.   Walk with cardiac rehab.   Loralie Champagne 04/09/2016 7:42 AM

## 2016-04-09 NOTE — Procedures (Signed)
CHF, PICC needed for CVP MONITORING AND IV ACCESS  RUE VENOGRAM: CHRONIC CENTRAL OCCLUSION  S/P LUE DL POWER PICC Tip svcra No comp Stable Ready for use Full report in pacs

## 2016-04-09 NOTE — Progress Notes (Signed)
  Echocardiogram 2D Echocardiogram has been performed.  Donata Clay 04/09/2016, 11:37 AM

## 2016-04-09 NOTE — Progress Notes (Signed)
[place] for Coumadin Indication: atrial fibrillation  Allergies  Allergen Reactions  . Morphine And Related Nausea Only and Other (See Comments)    Feels sick,and like she is going to pass out; can't breathe, starts sweating    Patient Measurements: Height: 5\' 7"  (170.2 cm) Weight: (!) 315 lb 4.1 oz (143 kg) IBW/kg (Calculated) : 61.6  Vital Signs: Temp: 97.3 F (36.3 C) (01/10 0824) Temp Source: Oral (01/10 0824) BP: 93/67 (01/10 0700) Pulse Rate: 86 (01/10 0700)  Labs:  Recent Labs  04/07/16 1025 04/08/16 1330 04/08/16 1600 04/08/16 1834 04/09/16 0206  HGB  --  11.6*  --   --   --   HCT  --  34.0*  --   --   --   LABPROT  --   --   --  37.4* 42.2*  INR 3.7  --   --  3.67 4.27*  CREATININE  --   --  1.80* 1.75* 1.90*    Estimated Creatinine Clearance: 50.9 mL/min (by C-G formula based on SCr of 1.9 mg/dL (H)).   Medical History: Past Medical History:  Diagnosis Date  . AICD (automatic cardioverter/defibrillator) present 2002, 2010   secondary to post-partum cardiomyopathy (1996)  . CHF (congestive heart failure) ([place])   . Diabetes mellitus without complication ([place])   . Hypertension   . Hypertensive cardiovascular disease   . Obesity   . Postpartum cardiomyopathy    Last echocardiogram was last year and EF 45 -50 %  . Seasonal allergies   . Sleep apnea    complaint with CPAP  . Ventricular tachycardia (HCC)    s/p ICD Implantation (MDT Secura VR)    Assessment: 51 year old female with atrial fibrillation diagnosed 3 weeks ago on admission at [facility] and Coumadin was started that admission. Patient now admitted 04/08/2016 to the ICU with hypotension and marked volume overload. Plan was for DCCV but unable due to left atrial appendage clot found on TEE prior to procedure. Pharmacy consult to continue Coumadin for LAA clot and atrial fibrillation.   INR 04/09/16 is elevated at 4.27 - note LAA thrombus so  shooting for INR goal of 2.5-3 clearly above goal this am, no bleeding noted.  Home Coumadin dose was 10 mg daily at 6 PM- last dose was 04/07/16  Goal of Therapy:  INR 2.5-3 Monitor platelets by anticoagulation protocol: Yes   Plan:  Continue to hold warfarin and follow trend Amiodarone dose reduced to 200mg  qd  Erin Hearing PharmD., BCPS Clinical Pharmacist Pager [phone] 04/09/2016 11:07 AM

## 2016-04-10 DIAGNOSIS — I4891 Unspecified atrial fibrillation: Secondary | ICD-10-CM

## 2016-04-10 LAB — CBC
HCT: 33.4 % — ABNORMAL LOW (ref 36.0–46.0)
Hemoglobin: 10.2 g/dL — ABNORMAL LOW (ref 12.0–15.0)
MCH: 24.9 pg — ABNORMAL LOW (ref 26.0–34.0)
MCHC: 30.5 g/dL (ref 30.0–36.0)
MCV: 81.7 fL (ref 78.0–100.0)
PLATELETS: 266 10*3/uL (ref 150–400)
RBC: 4.09 MIL/uL (ref 3.87–5.11)
RDW: 15.9 % — AB (ref 11.5–15.5)
WBC: 9.1 10*3/uL (ref 4.0–10.5)

## 2016-04-10 LAB — MAGNESIUM: Magnesium: 1.6 mg/dL — ABNORMAL LOW (ref 1.7–2.4)

## 2016-04-10 LAB — COMPREHENSIVE METABOLIC PANEL
ALBUMIN: 3.1 g/dL — AB (ref 3.5–5.0)
ALT: 71 U/L — ABNORMAL HIGH (ref 14–54)
ANION GAP: 12 (ref 5–15)
AST: 35 U/L (ref 15–41)
Alkaline Phosphatase: 136 U/L — ABNORMAL HIGH (ref 38–126)
BUN: 40 mg/dL — ABNORMAL HIGH (ref 6–20)
CO2: 28 mmol/L (ref 22–32)
Calcium: 9.3 mg/dL (ref 8.9–10.3)
Chloride: 97 mmol/L — ABNORMAL LOW (ref 101–111)
Creatinine, Ser: 1.59 mg/dL — ABNORMAL HIGH (ref 0.44–1.00)
GFR calc non Af Amer: 36 mL/min — ABNORMAL LOW (ref >60)
GFR, EST AFRICAN AMERICAN: 42 mL/min — AB (ref >60)
GLUCOSE: 189 mg/dL — AB (ref 65–99)
POTASSIUM: 3.4 mmol/L — AB (ref 3.5–5.1)
SODIUM: 137 mmol/L (ref 135–145)
TOTAL PROTEIN: 6.3 g/dL — AB (ref 6.5–8.1)
Total Bilirubin: 1.1 mg/dL (ref 0.3–1.2)

## 2016-04-10 LAB — PROTIME-INR
INR: 2.41
INR: 2.21
PROTHROMBIN TIME: 26.7 s — AB (ref 11.4–15.2)
Prothrombin Time: 24.9 seconds — ABNORMAL HIGH (ref 11.4–15.2)

## 2016-04-10 LAB — GLUCOSE, CAPILLARY
GLUCOSE-CAPILLARY: 145 mg/dL — AB (ref 65–99)
Glucose-Capillary: 149 mg/dL — ABNORMAL HIGH (ref 65–99)
Glucose-Capillary: 114 mg/dL — ABNORMAL HIGH (ref 65–99)
Glucose-Capillary: 154 mg/dL — ABNORMAL HIGH (ref 65–99)

## 2016-04-10 LAB — PROTEIN ELECTROPHORESIS, SERUM
A/G Ratio: 1 (ref 0.7–1.7)
ALBUMIN ELP: 2.9 g/dL (ref 2.9–4.4)
ALPHA-1-GLOBULIN: 0.3 g/dL (ref 0.0–0.4)
Alpha-2-Globulin: 0.7 g/dL (ref 0.4–1.0)
BETA GLOBULIN: 1 g/dL (ref 0.7–1.3)
GAMMA GLOBULIN: 1 g/dL (ref 0.4–1.8)
Globulin, Total: 3 g/dL (ref 2.2–3.9)
PDF: 0
TOTAL PROTEIN ELP: 5.9 g/dL — AB (ref 6.0–8.5)

## 2016-04-10 LAB — T4, FREE: FREE T4: 1.56 ng/dL — AB (ref 0.61–1.12)

## 2016-04-10 LAB — HEPARIN LEVEL (UNFRACTIONATED): Heparin Unfractionated: 0.22 IU/mL — ABNORMAL LOW (ref 0.30–0.70)

## 2016-04-10 LAB — HEMOGLOBIN A1C
Hgb A1c MFr Bld: 6.9 % — ABNORMAL HIGH (ref 4.8–5.6)
Mean Plasma Glucose: 151 mg/dL

## 2016-04-10 LAB — COOXEMETRY PANEL
CARBOXYHEMOGLOBIN: 1.4 % (ref 0.5–1.5)
METHEMOGLOBIN: 1 % (ref 0.0–1.5)
O2 SAT: 67.6 %
Total hemoglobin: 10.6 g/dL — ABNORMAL LOW (ref 12.0–16.0)

## 2016-04-10 MED ORDER — SODIUM CHLORIDE 0.9 % IV SOLN: INTRAVENOUS | Status: DC

## 2016-04-10 MED ORDER — ASPIRIN 81 MG PO CHEW
81.0000 mg | CHEWABLE_TABLET | ORAL | Status: AC
  Administered 2016-04-11: 81 mg via ORAL
  Filled 2016-04-10: qty 1

## 2016-04-10 MED ORDER — AMIODARONE LOAD VIA INFUSION
150.0000 mg | Freq: Once | INTRAVENOUS | Status: DC
  Filled 2016-04-10: qty 83.34

## 2016-04-10 MED ORDER — AMIODARONE HCL IN DEXTROSE 360-4.14 MG/200ML-% IV SOLN: 30.0000 mg/h | INTRAVENOUS | Status: DC

## 2016-04-10 MED ORDER — WARFARIN SODIUM 10 MG PO TABS: 10.0000 mg | ORAL_TABLET | Freq: Once | ORAL | Status: DC

## 2016-04-10 MED ORDER — MAGNESIUM SULFATE 4 GM/100ML IV SOLN
4.0000 g | Freq: Once | INTRAVENOUS | Status: AC
  Administered 2016-04-10: 4 g via INTRAVENOUS
  Filled 2016-04-10: qty 100

## 2016-04-10 MED ORDER — AMIODARONE HCL 200 MG PO TABS
200.0000 mg | ORAL_TABLET | Freq: Two times a day (BID) | ORAL | Status: DC
  Administered 2016-04-10: 200 mg via ORAL
  Filled 2016-04-10: qty 1

## 2016-04-10 MED ORDER — SODIUM CHLORIDE 0.9 % IV SOLN: 250.0000 mL | INTRAVENOUS | Status: DC | PRN

## 2016-04-10 MED ORDER — AMIODARONE HCL IN DEXTROSE 360-4.14 MG/200ML-% IV SOLN
30.0000 mg/h | INTRAVENOUS | Status: DC
  Administered 2016-04-10 – 2016-04-13 (×5): 30 mg/h via INTRAVENOUS
  Filled 2016-04-10 (×5): qty 200

## 2016-04-10 MED ORDER — DIGOXIN 125 MCG PO TABS
0.1250 mg | ORAL_TABLET | Freq: Every day | ORAL | Status: DC
  Administered 2016-04-12 – 2016-04-14 (×3): 0.125 mg via ORAL
  Filled 2016-04-10 (×3): qty 1

## 2016-04-10 MED ORDER — SODIUM CHLORIDE 0.9% FLUSH: 3.0000 mL | Freq: Two times a day (BID) | INTRAVENOUS | Status: DC

## 2016-04-10 MED ORDER — DIGOXIN 125 MCG PO TABS: 0.1250 mg | ORAL_TABLET | Freq: Every day | ORAL | Status: DC

## 2016-04-10 MED ORDER — ASPIRIN EC 81 MG PO TBEC: 81.0000 mg | DELAYED_RELEASE_TABLET | Freq: Every day | ORAL | Status: DC

## 2016-04-10 MED ORDER — HEPARIN (PORCINE) IN NACL 100-0.45 UNIT/ML-% IJ SOLN
1600.0000 [IU]/h | INTRAMUSCULAR | Status: DC
  Administered 2016-04-10: 1400 [IU]/h via INTRAVENOUS
  Filled 2016-04-10: qty 250

## 2016-04-10 MED ORDER — AMIODARONE HCL IN DEXTROSE 360-4.14 MG/200ML-% IV SOLN
60.0000 mg/h | INTRAVENOUS | Status: AC
Start: 2016-04-10 — End: 2016-04-10
  Administered 2016-04-10 (×2): 60 mg/h via INTRAVENOUS
  Filled 2016-04-10 (×3): qty 200

## 2016-04-10 MED ORDER — SODIUM CHLORIDE 0.9 % IV SOLN
INTRAVENOUS | Status: DC
  Administered 2016-04-11: 05:00:00 via INTRAVENOUS

## 2016-04-10 MED ORDER — DIGOXIN 250 MCG PO TABS
0.2500 mg | ORAL_TABLET | Freq: Once | ORAL | Status: AC
  Administered 2016-04-10: 0.25 mg via ORAL
  Filled 2016-04-10: qty 1

## 2016-04-10 MED ORDER — POTASSIUM CHLORIDE CRYS ER 20 MEQ PO TBCR
40.0000 meq | EXTENDED_RELEASE_TABLET | Freq: Two times a day (BID) | ORAL | Status: DC
  Administered 2016-04-10 – 2016-04-13 (×7): 40 meq via ORAL
  Filled 2016-04-10 (×7): qty 2

## 2016-04-10 MED ORDER — MILRINONE LACTATE IN DEXTROSE 20-5 MG/100ML-% IV SOLN: 0.1250 ug/kg/min | INTRAVENOUS | Status: DC

## 2016-04-10 MED ORDER — SODIUM CHLORIDE 0.9% FLUSH: 3.0000 mL | INTRAVENOUS | Status: DC | PRN

## 2016-04-10 MED ORDER — SPIRONOLACTONE 25 MG PO TABS
12.5000 mg | ORAL_TABLET | Freq: Every day | ORAL | Status: DC
  Administered 2016-04-10 – 2016-04-14 (×4): 12.5 mg via ORAL
  Filled 2016-04-10 (×4): qty 1

## 2016-04-10 MED ORDER — AMIODARONE LOAD VIA INFUSION
150.0000 mg | Freq: Once | INTRAVENOUS | Status: AC
  Administered 2016-04-10: 150 mg via INTRAVENOUS
  Filled 2016-04-10: qty 83.34

## 2016-04-10 NOTE — Progress Notes (Signed)
   CVP 4. Remains in  fib RVR.   Stop IV lasix and milrinone. Continue IV amio.   Plan for RHC/LHC in am.   Discussed with Dr Aundra Dubin.   Amy Clegg NP-C  4:06 PM

## 2016-04-10 NOTE — Progress Notes (Signed)
Advanced Heart Failure Rounding Note   Subjective:   Admitted with marked volume overload from Endo with marked volume overload, LAA clot, and A fib. TEE EF ~20%.   Yesterday milrinone added. Todays CO-OX is 68%. Diuresing with IV lasix + metolazone. Brisk diuresis noted. Weight down 20 pounds.    Denies SOB. Feeling better.   Objective:   Weight Range:  Vital Signs:   Temp:  [97.3 F (36.3 C)-97.7 F (36.5 C)] 97.5 F (36.4 C) (01/11 0340) Pulse Rate:  [97-140] 140 (01/11 0340) Resp:  [10-18] 16 (01/11 0340) BP: (68-112)/(42-86) 95/64 (01/11 0340) SpO2:  [94 %-100 %] 98 % (01/11 0340) Weight:  [295 lb 12.8 oz (134.2 kg)] 295 lb 12.8 oz (134.2 kg) (01/11 0340) Last BM Date: 04/08/16  Weight change: Filed Weights   04/08/16 1715 04/10/16 0340  Weight: (!) 315 lb 4.1 oz (143 kg) 295 lb 12.8 oz (134.2 kg)    Intake/Output:   Intake/Output Summary (Last 24 hours) at 04/10/16 0737 Last data filed at 04/10/16 0600  Gross per 24 hour  Intake           353.33 ml  Output             8300 ml  Net         -7946.67 ml    Physical Exam: CVP 7-8 General:  Pale. Dyspneic at rest. In bed.  HEENT: normal Neck: Thick, supple. JVP 7-8. Carotids 2+ bilat; no bruits. No lymphadenopathy or thyromegaly appreciated. Cor: PMI nondisplaced. Irregular rate & rhythm. No rubs, gallops or murmurs. Lungs: clear Abdomen: soft, nontender, nondistended. No hepatosplenomegaly. No bruits or masses. Good bowel sounds. Extremities: no cyanosis, clubbing, rash, R and LLE 1 +edema RUE PICC  Neuro: alert & oriented x 3, cranial nerves grossly intact. moves all 4 extremities w/o difficulty. Affect pleasant.  Telemetry:  A fib/flutter 120s  Labs: Basic Metabolic Panel:  Recent Labs Lab 04/08/16 1330  04/08/16 1600 04/08/16 1834 04/09/16 0206 04/10/16 0355  NA 137  --  136 137 137 137  K 4.5  --  4.6 4.2 4.4 3.4*  CL  --   --  104 103 103 97*  CO2  --   --  '23 23 24 28  ' GLUCOSE 108*  --   111* 122* 124* 189*  BUN  --   --  50* 50* 51* 40*  CREATININE  --   --  1.80* 1.75* 1.90* 1.59*  CALCIUM  --   < > 8.9 9.3 9.0 9.3  MG  --   --  1.7  --   --  1.6*  < > = values in this interval not displayed.  Liver Function Tests:  Recent Labs Lab 04/08/16 1600 04/10/16 0355  AST 45* 35  ALT 90* 71*  ALKPHOS 130* 136*  BILITOT 0.2* 1.1  PROT 5.7* 6.3*  ALBUMIN 2.8* 3.1*   No results for input(s): LIPASE, AMYLASE in the last 168 hours. No results for input(s): AMMONIA in the last 168 hours.  CBC:  Recent Labs Lab 04/08/16 1330 04/10/16 0355  WBC  --  9.1  HGB 11.6* 10.2*  HCT 34.0* 33.4*  MCV  --  81.7  PLT  --  266    Cardiac Enzymes: No results for input(s): CKTOTAL, CKMB, CKMBINDEX, TROPONINI in the last 168 hours.  BNP: BNP (last 3 results)  Recent Labs  04/08/16 1834  BNP 601.2*    ProBNP (last 3 results) No results for input(s):  PROBNP in the last 8760 hours.    Other results:  Imaging: Ir Veno/ext/uni Right  Result Date: 04/09/2016 INDICATION: CHF, central venous access for CVP monitoring, poor peripheral veins. EXAM: ULTRASOUND GUIDANCE FOR VASCULAR ACCESS RIGHT UPPER EXTREMITY VENOGRAM ULTRASOUND AND FLUOROSCOPIC GUIDED LEFT UPPER EXTREMITY PICC LINE INSERTION MEDICATIONS: 1% LIDOCAINE LOCALLY CONTRAST:  10 cc Isovue 300 FLUOROSCOPY TIME:  2 minutes 18 seconds (23 mGy) COMPLICATIONS: None immediate. TECHNIQUE: The procedure, risks, benefits, and alternatives were explained to the patient and informed written consent was obtained. A timeout was performed prior to the initiation of the procedure. Initially, the right upper extremity was sterilely prepped and draped. Under sterile conditions and local anesthesia, right brachial vein access was performed. Guidewire would not advance centrally. Through the 5 French peel-away sheath, right upper extremity venogram performed. Right upper extremity venogram: This confirms central chronic appearing  occlusion of the right subclavian and innominate veins. Therefore this access was removed. Hemostasis obtained with manual compression. Left upper extremity PICC line will be inserted. The left upper extremity was prepped with chlorhexidine in a sterile fashion, and a sterile drape was applied covering the operative field. Maximum barrier sterile technique with sterile gowns and gloves were used for the procedure. A timeout was performed prior to the initiation of the procedure. Local anesthesia was provided with 1% lidocaine. Under direct ultrasound guidance, the left basilic vein was accessed with a micropuncture kit after the overlying soft tissues were anesthetized with 1% lidocaine. An ultrasound image was saved for documentation purposes. A guidewire was advanced to the level of the superior caval-atrial junction for measurement purposes and the PICC line was cut to length. A peel-away sheath was placed and a 47 cm, 5 [place], dual lumen was inserted to level of the superior caval-atrial junction. A post procedure spot fluoroscopic was obtained. The catheter easily aspirated and flushed and was sutured in place. A dressing was placed. The patient tolerated the procedure well without immediate post procedural complication. FINDINGS: Right upper extremity venogram confirms central chronic occlusion of the right subclavian and innominate veins. After left upper extremity PICC placement, the tip lies within the superior cavoatrial junction. The catheter aspirates and flushes normally and is ready for immediate use. 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 Successful ultrasound and fluoroscopic guided placement of a left basilic vein approach, 47 cm, 5 French, dual lumen PICC with tip at the superior caval-atrial junction. The PICC line is ready for immediate use. Electronically Signed   By: Jerilynn Mages.  Shick M.D.   On: 04/09/2016 09:35   Ir Fluoro Guide Cv Line Left  Result  Date: 04/09/2016 INDICATION: CHF, central venous access for CVP monitoring, poor peripheral veins. EXAM: ULTRASOUND GUIDANCE FOR VASCULAR ACCESS RIGHT UPPER EXTREMITY VENOGRAM ULTRASOUND AND FLUOROSCOPIC GUIDED LEFT UPPER EXTREMITY PICC LINE INSERTION MEDICATIONS: 1% LIDOCAINE LOCALLY CONTRAST:  10 cc Isovue 300 FLUOROSCOPY TIME:  2 minutes 18 seconds (23 mGy) COMPLICATIONS: None immediate. TECHNIQUE: The procedure, risks, benefits, and alternatives were explained to the patient and informed written consent was obtained. A timeout was performed prior to the initiation of the procedure. Initially, the right upper extremity was sterilely prepped and draped. Under sterile conditions and local anesthesia, right brachial vein access was performed. Guidewire would not advance centrally. Through the 5 French peel-away sheath, right upper extremity venogram performed. Right upper extremity venogram: This confirms central chronic appearing occlusion of the right subclavian and innominate veins. Therefore this access was removed. Hemostasis obtained with manual  compression. Left upper extremity PICC line will be inserted. The left upper extremity was prepped with chlorhexidine in a sterile fashion, and a sterile drape was applied covering the operative field. Maximum barrier sterile technique with sterile gowns and gloves were used for the procedure. A timeout was performed prior to the initiation of the procedure. Local anesthesia was provided with 1% lidocaine. Under direct ultrasound guidance, the left basilic vein was accessed with a micropuncture kit after the overlying soft tissues were anesthetized with 1% lidocaine. An ultrasound image was saved for documentation purposes. A guidewire was advanced to the level of the superior caval-atrial junction for measurement purposes and the PICC line was cut to length. A peel-away sheath was placed and a 47 cm, 5 [place], dual lumen was inserted to level of the superior  caval-atrial junction. A post procedure spot fluoroscopic was obtained. The catheter easily aspirated and flushed and was sutured in place. A dressing was placed. The patient tolerated the procedure well without immediate post procedural complication. FINDINGS: Right upper extremity venogram confirms central chronic occlusion of the right subclavian and innominate veins. After left upper extremity PICC placement, the tip lies within the superior cavoatrial junction. The catheter aspirates and flushes normally and is ready for immediate use. 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 Successful ultrasound and fluoroscopic guided placement of a left basilic vein approach, 47 cm, 5 French, dual lumen PICC with tip at the superior caval-atrial junction. The PICC line is ready for immediate use. Electronically Signed   By: Jerilynn Mages.  Shick M.D.   On: 04/09/2016 09:35   Ir US Guide Vasc Access Left  Result Date: 04/09/2016 INDICATION: CHF, central venous access for CVP monitoring, poor peripheral veins. EXAM: ULTRASOUND GUIDANCE FOR VASCULAR ACCESS RIGHT UPPER EXTREMITY VENOGRAM ULTRASOUND AND FLUOROSCOPIC GUIDED LEFT UPPER EXTREMITY PICC LINE INSERTION MEDICATIONS: 1% LIDOCAINE LOCALLY CONTRAST:  10 cc Isovue 300 FLUOROSCOPY TIME:  2 minutes 18 seconds (23 mGy) COMPLICATIONS: None immediate. TECHNIQUE: The procedure, risks, benefits, and alternatives were explained to the patient and informed written consent was obtained. A timeout was performed prior to the initiation of the procedure. Initially, the right upper extremity was sterilely prepped and draped. Under sterile conditions and local anesthesia, right brachial vein access was performed. Guidewire would not advance centrally. Through the 5 French peel-away sheath, right upper extremity venogram performed. Right upper extremity venogram: This confirms central chronic appearing occlusion of the right subclavian and innominate  veins. Therefore this access was removed. Hemostasis obtained with manual compression. Left upper extremity PICC line will be inserted. The left upper extremity was prepped with chlorhexidine in a sterile fashion, and a sterile drape was applied covering the operative field. Maximum barrier sterile technique with sterile gowns and gloves were used for the procedure. A timeout was performed prior to the initiation of the procedure. Local anesthesia was provided with 1% lidocaine. Under direct ultrasound guidance, the left basilic vein was accessed with a micropuncture kit after the overlying soft tissues were anesthetized with 1% lidocaine. An ultrasound image was saved for documentation purposes. A guidewire was advanced to the level of the superior caval-atrial junction for measurement purposes and the PICC line was cut to length. A peel-away sheath was placed and a 47 cm, 5 [place], dual lumen was inserted to level of the superior caval-atrial junction. A post procedure spot fluoroscopic was obtained. The catheter easily aspirated and flushed and was sutured in place. A dressing was placed. The patient tolerated the  procedure well without immediate post procedural complication. FINDINGS: Right upper extremity venogram confirms central chronic occlusion of the right subclavian and innominate veins. After left upper extremity PICC placement, the tip lies within the superior cavoatrial junction. The catheter aspirates and flushes normally and is ready for immediate use. 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 Successful ultrasound and fluoroscopic guided placement of a left basilic vein approach, 47 cm, 5 French, dual lumen PICC with tip at the superior caval-atrial junction. The PICC line is ready for immediate use. Electronically Signed   By: Jerilynn Mages.  Shick M.D.   On: 04/09/2016 09:35   Ir US Guide Vasc Access Right  Result Date: 04/09/2016 INDICATION: CHF, central venous  access for CVP monitoring, poor peripheral veins. EXAM: ULTRASOUND GUIDANCE FOR VASCULAR ACCESS RIGHT UPPER EXTREMITY VENOGRAM ULTRASOUND AND FLUOROSCOPIC GUIDED LEFT UPPER EXTREMITY PICC LINE INSERTION MEDICATIONS: 1% LIDOCAINE LOCALLY CONTRAST:  10 cc Isovue 300 FLUOROSCOPY TIME:  2 minutes 18 seconds (23 mGy) COMPLICATIONS: None immediate. TECHNIQUE: The procedure, risks, benefits, and alternatives were explained to the patient and informed written consent was obtained. A timeout was performed prior to the initiation of the procedure. Initially, the right upper extremity was sterilely prepped and draped. Under sterile conditions and local anesthesia, right brachial vein access was performed. Guidewire would not advance centrally. Through the 5 French peel-away sheath, right upper extremity venogram performed. Right upper extremity venogram: This confirms central chronic appearing occlusion of the right subclavian and innominate veins. Therefore this access was removed. Hemostasis obtained with manual compression. Left upper extremity PICC line will be inserted. The left upper extremity was prepped with chlorhexidine in a sterile fashion, and a sterile drape was applied covering the operative field. Maximum barrier sterile technique with sterile gowns and gloves were used for the procedure. A timeout was performed prior to the initiation of the procedure. Local anesthesia was provided with 1% lidocaine. Under direct ultrasound guidance, the left basilic vein was accessed with a micropuncture kit after the overlying soft tissues were anesthetized with 1% lidocaine. An ultrasound image was saved for documentation purposes. A guidewire was advanced to the level of the superior caval-atrial junction for measurement purposes and the PICC line was cut to length. A peel-away sheath was placed and a 47 cm, 5 [place], dual lumen was inserted to level of the superior caval-atrial junction. A post procedure spot fluoroscopic  was obtained. The catheter easily aspirated and flushed and was sutured in place. A dressing was placed. The patient tolerated the procedure well without immediate post procedural complication. FINDINGS: Right upper extremity venogram confirms central chronic occlusion of the right subclavian and innominate veins. After left upper extremity PICC placement, the tip lies within the superior cavoatrial junction. The catheter aspirates and flushes normally and is ready for immediate use. 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 Successful ultrasound and fluoroscopic guided placement of a left basilic vein approach, 47 cm, 5 French, dual lumen PICC with tip at the superior caval-atrial junction. The PICC line is ready for immediate use. Electronically Signed   By: Jerilynn Mages.  Shick M.D.   On: 04/09/2016 09:35     Medications:     Scheduled Medications: . amiodarone  200 mg Oral Daily  . aspirin EC  81 mg Oral Daily  . furosemide  80 mg Intravenous BID  . insulin aspart  0-20 Units Subcutaneous TID WC  . sodium chloride flush  3 mL Intravenous Q12H  . Warfarin -  Pharmacist Dosing Inpatient   Does not apply q1800    Infusions: . milrinone 0.25 mcg/kg/min (04/10/16 0011)  . norepinephrine (LEVOPHED) Adult infusion      PRN Medications: sodium chloride, acetaminophen, lidocaine, ondansetron (ZOFRAN) IV, sodium chloride flush   Assessment/Plan   Erin Jarvis is a 51 year old admitted with A/C systolic heart failure, A fib/flutter, and new LAA clot noted on TEE. She has marked volume overload and hypotension. Admit to ICU due to hypotension.   1. A/C Systolic Heart Failure - Post Partum cardiomyopathy 1996. [place] 2001 no CAD. ICD Medtronic. TEE LVEF < 20%. ECHO 04/09/16 EF 25-30% akinesis anteroseptal and   inferoseptal myocardium. RV mildly dilated.  Low voltage noted on monitor. Check SPEP/UPEP pending.   Today CO-OX is 68% on milrinone 0.25 mcg. Tachy so will  cut back milrinone to 0.125 mcg.  Volume status improved. Stop metolazone. Continue IV lasix one more day.   SBP soft. No bb for now due to suspected low output. No Ace/ARNi Add 12.5 mg spiro daily.   Renal function trending down.  Creatinine 1.9.>1.5.  Add digoxin now.  2. A Fib/Flutter- rate controlled. Hold on amio drip. Increase amio to 200 mg twice a day. Rate controlled.  Unable to complete DC-CV due to LAA clot. Hold coumadin tonight. Pharmacy to dose coumadin. Todays INR 2.41. Will need heparin once INR 2.2 or below, repeat this evening.  3. LAA Clot- on coumadin as above.  4. H/O VT-keep K .4 and Mag >2.   K 3.4 Mag 1.6. Give 4 grams Mag now.  5. DMII- hold metformin with recently elevated creatinine. Place on sliding scale.  Hgb A1C 6.9.  6. Morbid Obesity 7. TSH - elevated 5.137. Check T3 T4.   Plan for RHC with LHC as long as creatinine <1.5.  Length of Stay: 2  Erin Clegg NP-C  04/10/2016, 7:37 AM  Advanced Heart Failure Team Pager [phone] (M-F; 7a - 4p)  Please contact [facility] for night-coverage after hours (4p -7a ) and weekends on [email or web address]  1. Acute on chronic systolic CHF: EF <75% on TEE. Nonischemic cardiomyopathy, known since 68. Possibly peri-partum. Cath around 2001 in [place] reportedly showed no coronary disease. Has Medtronic ICD. She was admitted with volume overload/soft BP. She has felt progressively worse over the last few weeks. Low output by co-ox.  She was started on milrinone and diuresed very well yesterday.  Weight now down about 20 lbs. Today, co-ox 68% with CVP now 7-8. Creatinine better.  - HR in 120s-130s in atrial fibrillation, will decrease milrinone to 0.125 this morning.  - Add digoxin 0.125 daily.  - Stop metolazone, can get IV Lasix at least this morning.  - Hold home ACEI and metoprolol.  - Can start spironolactone 12.5 daily.  - Low voltage ECG, sent SPEP.  - RHC/LHC when more diuresed => possibly tomorrow.  2.  Atrial flutter/fibrillation:  Just started warfarin about 3 weeks ago, does have LA appendage thrombus. HR elevated today in 120s, likely due to milrinone.  - Cannot cardiovert at this point with thrombus.  - Can continue amiodarone to load in anticipation of future DCCV, increase to 200 bid to help with rate.  Also decreasing milrinone today.  - Digoxin for rate control. - heparin gtt when INR 2.2 or below given thrombus (plan to hold heparin briefly for RHC/LHC tomorrow).    - Repeat TEE in about a month to see if thrombus has resolved with prolonged warfarin use.  3.  AKI: Creatinine better off lisinopril and with milrinone.    Loralie Champagne 04/10/2016 8:15 AM

## 2016-04-10 NOTE — Progress Notes (Signed)
[facility] for heparin Indication: atrial fibrillation  Allergies  Allergen Reactions  . Morphine And Related Nausea Only and Other (See Comments)    Feels sick,and like she is going to pass out; can't breathe, starts sweating    Patient Measurements: Height: 5\' 7"  (170.2 cm) Weight: 295 lb 12.8 oz (134.2 kg) IBW/kg (Calculated) : 61.6  Vital Signs: Temp: 98.3 F (36.8 C) (01/11 1210) Temp Source: Axillary (01/11 1210) BP: 99/67 (01/11 1210) Pulse Rate: 130 (01/11 1238)  Labs:  Recent Labs  04/08/16 1330  04/08/16 1834 04/09/16 0206 04/10/16 0355 04/10/16 1300  HGB 11.6*  --   --   --  10.2*  --   HCT 34.0*  --   --   --  33.4*  --   PLT  --   --   --   --  266  --   LABPROT  --   < > 37.4* 42.2* 26.7* 24.9*  INR  --   < > 3.67 4.27* 2.41 2.21  CREATININE  --   < > 1.75* 1.90* 1.59*  --   < > = values in this interval not displayed.  Estimated Creatinine Clearance: 58.5 mL/min (by C-G formula based on SCr of 1.59 mg/dL (H)).   Medical History: Past Medical History:  Diagnosis Date  . AICD (automatic cardioverter/defibrillator) present 2002, 2010   secondary to post-partum cardiomyopathy (1996)  . CHF (congestive heart failure) ([place])   . Diabetes mellitus without complication ([place])   . Hypertension   . Hypertensive cardiovascular disease   . Obesity   . Postpartum cardiomyopathy    Last echocardiogram was last year and EF 45 -50 %  . Seasonal allergies   . Sleep apnea    complaint with CPAP  . Ventricular tachycardia ([facility])    s/p ICD Implantation ([facility] [place] [facility])    Assessment: 51 year old female with atrial fibrillation diagnosed 3 weeks ago on admission at [facility] and Coumadin was started that admission. Patient now admitted 04/08/2016 to the ICU with hypotension and marked volume overload. Plan was for DCCV but unable due to left atrial appendage clot found on TEE prior to procedure.  INR 2.4 this morning  and 2.2 this afternoon. New plan to take for [facility] tomorrow as long as scr is stable.   Per discussion with cardiology will start heparin when INR close to 2.2 with LA clot noted earlier this week.   Home Coumadin dose was 10 mg daily at 6 PM- last dose was 04/07/16  Goal of Therapy:  INR 2.5-3 Monitor platelets by anticoagulation protocol: Yes   Plan:  Hold warfarin tonight Start IV heparin 1400 units/hour Check 6 hour heparin level then daily heparin level and cbc  Erin Hearing PharmD., BCPS Clinical Pharmacist Pager [phone] 04/10/2016 3:11 PM

## 2016-04-10 NOTE — Progress Notes (Signed)
CARDIAC REHAB PHASE I   Second attempt to ambulate with pt today. Pt now on IV amio, HR remains elevated 110s-130s. Spoke with RN, will await improvement of HR prior to ambulation. Will follow-up tomorrow.   Lenna Sciara, RN, BSN 04/10/2016 2:17 PM

## 2016-04-11 ENCOUNTER — Encounter (HOSPITAL_COMMUNITY): Payer: Self-pay | Admitting: Cardiology

## 2016-04-11 ENCOUNTER — Encounter (HOSPITAL_COMMUNITY)
Admission: RE | Disposition: A | Discharge status: Home Health | Payer: Self-pay | Source: Ambulatory Visit | Attending: Cardiology

## 2016-04-11 DIAGNOSIS — I509 Heart failure, unspecified: Secondary | ICD-10-CM

## 2016-04-11 HISTORY — PX: CARDIAC CATHETERIZATION: SHX172

## 2016-04-11 LAB — BASIC METABOLIC PANEL
ANION GAP: 13 (ref 5–15)
BUN: 30 mg/dL — ABNORMAL HIGH (ref 6–20)
CO2: 27 mmol/L (ref 22–32)
Calcium: 9.1 mg/dL (ref 8.9–10.3)
Chloride: 98 mmol/L — ABNORMAL LOW (ref 101–111)
Creatinine, Ser: 1.62 mg/dL — ABNORMAL HIGH (ref 0.44–1.00)
GFR calc Af Amer: 41 mL/min — ABNORMAL LOW (ref >60)
GFR calc non Af Amer: 35 mL/min — ABNORMAL LOW (ref >60)
GLUCOSE: 113 mg/dL — AB (ref 65–99)
POTASSIUM: 4.2 mmol/L (ref 3.5–5.1)
Sodium: 138 mmol/L (ref 135–145)

## 2016-04-11 LAB — POCT I-STAT 3, VENOUS BLOOD GAS (G3P V)
Acid-Base Excess: 6 mmol/L — ABNORMAL HIGH (ref 0.0–2.0)
Acid-Base Excess: 6 mmol/L — ABNORMAL HIGH (ref 0.0–2.0)
BICARBONATE: 29.4 mmol/L — AB (ref 20.0–28.0)
Bicarbonate: 29.8 mmol/L — ABNORMAL HIGH (ref 20.0–28.0)
O2 SAT: 64 %
O2 Saturation: 67 %
PCO2 VEN: 39.4 mmHg — AB (ref 44.0–60.0)
PH VEN: 7.486 — AB (ref 7.250–7.430)
PH VEN: 7.499 — AB (ref 7.250–7.430)
PO2 VEN: 32 mmHg (ref 32.0–45.0)
TCO2: 31 mmol/L (ref 0–100)
TCO2: 30 mmol/L (ref 0–100)
pCO2, Ven: 37.8 mmHg — ABNORMAL LOW (ref 44.0–60.0)
pO2, Ven: 31 mmHg — CL (ref 32.0–45.0)

## 2016-04-11 LAB — LIPID PANEL
CHOL/HDL RATIO: 3.4 ratio
Cholesterol: 122 mg/dL (ref 0–200)
HDL: 36 mg/dL — AB (ref >40)
LDL CALC: 67 mg/dL (ref 0–99)
TRIGLYCERIDES: 97 mg/dL (ref <150)
VLDL: 19 mg/dL (ref 0–40)

## 2016-04-11 LAB — GLUCOSE, CAPILLARY
GLUCOSE-CAPILLARY: 116 mg/dL — AB (ref 65–99)
Glucose-Capillary: 121 mg/dL — ABNORMAL HIGH (ref 65–99)
Glucose-Capillary: 122 mg/dL — ABNORMAL HIGH (ref 65–99)
Glucose-Capillary: 147 mg/dL — ABNORMAL HIGH (ref 65–99)

## 2016-04-11 LAB — CBC
HCT: 33.8 % — ABNORMAL LOW (ref 36.0–46.0)
Hemoglobin: 10.3 g/dL — ABNORMAL LOW (ref 12.0–15.0)
MCH: 24.9 pg — ABNORMAL LOW (ref 26.0–34.0)
MCHC: 30.5 g/dL (ref 30.0–36.0)
MCV: 81.6 fL (ref 78.0–100.0)
Platelets: 264 K/uL (ref 150–400)
RBC: 4.14 MIL/uL (ref 3.87–5.11)
RDW: 16 % — ABNORMAL HIGH (ref 11.5–15.5)
WBC: 6.9 K/uL (ref 4.0–10.5)

## 2016-04-11 LAB — POCT ACTIVATED CLOTTING TIME
ACTIVATED CLOTTING TIME: 219 s
Activated Clotting Time: 180 seconds

## 2016-04-11 LAB — COOXEMETRY PANEL
Carboxyhemoglobin: 1.5 % (ref 0.5–1.5)
Methemoglobin: 1.1 % (ref 0.0–1.5)
O2 SAT: 76.1 %
TOTAL HEMOGLOBIN: 10.5 g/dL — AB (ref 12.0–16.0)

## 2016-04-11 LAB — MAGNESIUM: Magnesium: 2.2 mg/dL (ref 1.7–2.4)

## 2016-04-11 LAB — PROTIME-INR
INR: 1.92
Prothrombin Time: 22.3 seconds — ABNORMAL HIGH (ref 11.4–15.2)

## 2016-04-11 LAB — T3, FREE: T3, Free: 2.4 pg/mL (ref 2.0–4.4)

## 2016-04-11 SURGERY — RIGHT/LEFT HEART CATH AND CORONARY ANGIOGRAPHY
Anesthesia: LOCAL

## 2016-04-11 MED ORDER — METOLAZONE 2.5 MG PO TABS
2.5000 mg | ORAL_TABLET | Freq: Once | ORAL | Status: AC
  Administered 2016-04-11: 2.5 mg via ORAL
  Filled 2016-04-11: qty 1

## 2016-04-11 MED ORDER — HEPARIN (PORCINE) IN NACL 2-0.9 UNIT/ML-% IJ SOLN
INTRAMUSCULAR | Status: DC | PRN
  Administered 2016-04-11: 1000 mL

## 2016-04-11 MED ORDER — FUROSEMIDE 10 MG/ML IJ SOLN
80.0000 mg | Freq: Two times a day (BID) | INTRAMUSCULAR | Status: DC
  Administered 2016-04-11 – 2016-04-13 (×6): 80 mg via INTRAVENOUS
  Filled 2016-04-11 (×6): qty 8

## 2016-04-11 MED ORDER — VERAPAMIL HCL 2.5 MG/ML IV SOLN
INTRAVENOUS | Status: AC
  Filled 2016-04-11: qty 2

## 2016-04-11 MED ORDER — ONDANSETRON HCL 4 MG/2ML IJ SOLN: 4.0000 mg | Freq: Four times a day (QID) | INTRAMUSCULAR | Status: DC | PRN

## 2016-04-11 MED ORDER — ACETAMINOPHEN 325 MG PO TABS
650.0000 mg | ORAL_TABLET | ORAL | Status: DC | PRN
  Administered 2016-04-12 – 2016-04-14 (×3): 650 mg via ORAL
  Filled 2016-04-11 (×3): qty 2

## 2016-04-11 MED ORDER — SODIUM CHLORIDE 0.9% FLUSH
10.0000 mL | INTRAVENOUS | Status: DC | PRN
Start: 2016-04-11 — End: 2016-04-14

## 2016-04-11 MED ORDER — LIDOCAINE HCL (PF) 1 % IJ SOLN
INTRAMUSCULAR | Status: DC | PRN
  Administered 2016-04-11: 20 mL
  Administered 2016-04-11: 3 mL via INTRADERMAL

## 2016-04-11 MED ORDER — APIXABAN 5 MG PO TABS
5.0000 mg | ORAL_TABLET | Freq: Two times a day (BID) | ORAL | Status: DC
  Administered 2016-04-11 – 2016-04-14 (×6): 5 mg via ORAL
  Filled 2016-04-11 (×7): qty 1

## 2016-04-11 MED ORDER — HEPARIN SODIUM (PORCINE) 1000 UNIT/ML IJ SOLN
INTRAMUSCULAR | Status: AC
  Filled 2016-04-11: qty 1

## 2016-04-11 MED ORDER — METOPROLOL SUCCINATE ER 25 MG PO TB24
25.0000 mg | ORAL_TABLET | Freq: Two times a day (BID) | ORAL | Status: DC
  Administered 2016-04-11 – 2016-04-12 (×3): 25 mg via ORAL
  Filled 2016-04-11 (×3): qty 1

## 2016-04-11 MED ORDER — HEPARIN SODIUM (PORCINE) 1000 UNIT/ML IJ SOLN
INTRAMUSCULAR | Status: DC | PRN
  Administered 2016-04-11: 6500 [IU] via INTRAVENOUS

## 2016-04-11 MED ORDER — LIDOCAINE HCL (PF) 1 % IJ SOLN
INTRAMUSCULAR | Status: AC
  Filled 2016-04-11: qty 30

## 2016-04-11 MED ORDER — SODIUM CHLORIDE 0.9% FLUSH: 3.0000 mL | INTRAVENOUS | Status: DC | PRN

## 2016-04-11 MED ORDER — SODIUM CHLORIDE 0.9% FLUSH: 3.0000 mL | Freq: Two times a day (BID) | INTRAVENOUS | Status: DC

## 2016-04-11 MED ORDER — AMIODARONE HCL 150 MG/3ML IV SOLN
INTRAVENOUS | Status: AC
  Filled 2016-04-11: qty 3

## 2016-04-11 MED ORDER — AMIODARONE LOAD VIA INFUSION
INTRAVENOUS | Status: DC | PRN
  Administered 2016-04-11: 150 mg via INTRAVENOUS

## 2016-04-11 MED ORDER — FENTANYL CITRATE (PF) 100 MCG/2ML IJ SOLN
INTRAMUSCULAR | Status: DC | PRN
  Administered 2016-04-11 (×2): 25 ug via INTRAVENOUS

## 2016-04-11 MED ORDER — VERAPAMIL HCL 2.5 MG/ML IV SOLN
INTRAVENOUS | Status: DC | PRN
  Administered 2016-04-11: 10 mL via INTRA_ARTERIAL

## 2016-04-11 MED ORDER — HEPARIN (PORCINE) IN NACL 2-0.9 UNIT/ML-% IJ SOLN
INTRAMUSCULAR | Status: AC
  Filled 2016-04-11: qty 1000

## 2016-04-11 MED ORDER — SODIUM CHLORIDE 0.9 % IV SOLN: 250.0000 mL | INTRAVENOUS | Status: DC | PRN

## 2016-04-11 MED ORDER — SODIUM CHLORIDE 0.9% FLUSH
10.0000 mL | Freq: Two times a day (BID) | INTRAVENOUS | Status: DC
  Administered 2016-04-11 – 2016-04-13 (×6): 10 mL
  Administered 2016-04-14: 20 mL

## 2016-04-11 MED ORDER — MIDAZOLAM HCL 2 MG/2ML IJ SOLN
INTRAMUSCULAR | Status: AC
  Filled 2016-04-11: qty 2

## 2016-04-11 MED ORDER — FENTANYL CITRATE (PF) 100 MCG/2ML IJ SOLN
INTRAMUSCULAR | Status: AC
  Filled 2016-04-11: qty 2

## 2016-04-11 MED ORDER — IOPAMIDOL (ISOVUE-370) INJECTION 76%
INTRAVENOUS | Status: AC
  Filled 2016-04-11: qty 100

## 2016-04-11 MED ORDER — MIDAZOLAM HCL 2 MG/2ML IJ SOLN
INTRAMUSCULAR | Status: DC | PRN
  Administered 2016-04-11 (×2): 1 mg via INTRAVENOUS

## 2016-04-11 MED ORDER — IOPAMIDOL (ISOVUE-370) INJECTION 76%
INTRAVENOUS | Status: DC | PRN
  Administered 2016-04-11: 55 mL via INTRAVENOUS

## 2016-04-11 SURGICAL SUPPLY — 13 items
CATH IMPULSE 5F ANG/FL3.5 (CATHETERS) ×2 IMPLANT
CATH SWAN GANZ 7F STRAIGHT (CATHETERS) ×2 IMPLANT
DEVICE RAD COMP TR BAND LRG (VASCULAR PRODUCTS) ×2 IMPLANT
GLIDESHEATH SLEND SS 6F .021 (SHEATH) ×2 IMPLANT
GUIDEWIRE INQWIRE 1.5J.035X260 (WIRE) ×1 IMPLANT
HOVERMATT SINGLE USE (MISCELLANEOUS) ×2 IMPLANT
INQWIRE 1.5J .035X260CM (WIRE) ×2
KIT HEART LEFT (KITS) ×2 IMPLANT
PACK CARDIAC CATHETERIZATION (CUSTOM PROCEDURE TRAY) ×2 IMPLANT
SHEATH PINNACLE 7F 10CM (SHEATH) ×2 IMPLANT
TRANSDUCER W/STOPCOCK (MISCELLANEOUS) ×2 IMPLANT
TUBING CIL FLEX 10 FLL-RA (TUBING) ×2 IMPLANT
WIRE HI TORQ VERSACORE-J 145CM (WIRE) ×2 IMPLANT

## 2016-04-11 NOTE — Progress Notes (Signed)
CM consulted about the cost of Eliquis for the patient. CM placed a benefits check however the patient does not have any drug coverage. She has been paying out of pocket for her medications. CM provided her a 30 day free Eliquis card and encouraged her to call the Eliquis assistance line to see if she will qualify for assistance. Pt in agreement.  CM also provided her the number for Medicare Assistance so she can call about adding Medicare D to her current Medicare.

## 2016-04-11 NOTE — Progress Notes (Signed)
Patient ID: ADALYND DONAHOE, female   DOB: 22-Dec-1965, 51 y.o.   MRN: [id number]    Advanced Heart Failure Rounding Note   Subjective:    Admitted with marked volume overload from Endo with marked volume overload, LAA clot, and A fib with RVR. TEE EF ~20%.   She was started on milrinone with low co-ox and diuresed with good weight loss and UOP.  Milrinone stopped yesterday.  She is on IV amiodarone for weight control, still with elevated rate.  IV Lasix stopped yesterday.      Denies SOB. Feeling better.   LHC/RHC done today: Dominance: Right   Left Main  Short, no angiographic CAD.  Left Anterior Descending  No angiographic CAD.  Ramus Intermedius  Moderate vessel, no angiographic CAD.  Left Circumflex  No angiographic CAD.  Right Coronary Artery  No angiographic CAD.  Right Heart  Right Heart Pressures RHC Procedural Findings: Hemodynamics (mmHg) RA mean 19 RV 45/16 PA 46/20, mean 35 PCWP mean 24 LV 108/21 AO 111/75  Oxygen saturations: PA 66% AO 96%  Cardiac Output (Fick) 7.56  Cardiac Index (Fick) 3.16 PVR 1.4 WU  Cardiac Output (Thermo) 5.66 Cardiac Index (Thermo) 2.37  PVR 1.9 WU     Objective:   Weight Range:  Vital Signs:   Temp:  [97.6 F (36.4 C)-98.3 F (36.8 C)] 97.9 F (36.6 C) (01/12 0400) Pulse Rate:  [104-136] 121 (01/12 0433) Resp:  [12-27] 13 (01/12 0433) BP: (93-106)/(45-86) 99/68 (01/12 0433) SpO2:  [95 %-97 %] 96 % (01/12 0433) Weight:  [300 lb 4.3 oz (136.2 kg)] 300 lb 4.3 oz (136.2 kg) (01/12 0400) Last BM Date: 04/08/16  Weight change: Filed Weights   04/08/16 1715 04/10/16 0340 04/11/16 0400  Weight: (!) 315 lb 4.1 oz (143 kg) 295 lb 12.8 oz (134.2 kg) (!) 300 lb 4.3 oz (136.2 kg)    Intake/Output:   Intake/Output Summary (Last 24 hours) at 04/11/16 0905 Last data filed at 04/11/16 0700  Gross per 24 hour  Intake          1045.29 ml  Output             4150 ml  Net         -3104.71 ml    Physical Exam: General:   Pale. Dyspneic at rest. In bed.  HEENT: normal Neck: Thick, supple. JVP 10. Carotids 2+ bilat; no bruits. No lymphadenopathy or thyromegaly appreciated. Cor: PMI nondisplaced. Tachy, irregular rate & rhythm. No rubs, gallops or murmurs. Lungs: clear Abdomen: soft, nontender, nondistended. No hepatosplenomegaly. No bruits or masses. Good bowel sounds. Extremities: no cyanosis, clubbing, rash, R and LLE 1 +edema RUE PICC  Neuro: alert & oriented x 3, cranial nerves grossly intact. moves all 4 extremities w/o difficulty. Affect pleasant.  Telemetry:  Atrial fibrillation 120s  Labs: Basic Metabolic Panel:  Recent Labs Lab 04/08/16 1600 04/08/16 1834 04/09/16 0206 04/10/16 0355 04/11/16 0532  NA 136 137 137 137 138  K 4.6 4.2 4.4 3.4* 4.2  CL 104 103 103 97* 98*  CO2 '23 23 24 28 27  ' GLUCOSE 111* 122* 124* 189* 113*  BUN 50* 50* 51* 40* 30*  CREATININE 1.80* 1.75* 1.90* 1.59* 1.62*  CALCIUM 8.9 9.3 9.0 9.3 9.1  MG 1.7  --   --  1.6* 2.2    Liver Function Tests:  Recent Labs Lab 04/08/16 1600 04/10/16 0355  AST 45* 35  ALT 90* 71*  ALKPHOS 130* 136*  BILITOT 0.2* 1.1  PROT 5.7* 6.3*  ALBUMIN 2.8* 3.1*   No results for input(s): LIPASE, AMYLASE in the last 168 hours. No results for input(s): AMMONIA in the last 168 hours.  CBC:  Recent Labs Lab 04/08/16 1330 04/10/16 0355 04/11/16 0532  WBC  --  9.1 6.9  HGB 11.6* 10.2* 10.3*  HCT 34.0* 33.4* 33.8*  MCV  --  81.7 81.6  PLT  --  266 264    Cardiac Enzymes: No results for input(s): CKTOTAL, CKMB, CKMBINDEX, TROPONINI in the last 168 hours.  BNP: BNP (last 3 results)  Recent Labs  04/08/16 1834  BNP 601.2*    ProBNP (last 3 results) No results for input(s): PROBNP in the last 8760 hours.    Other results:  Imaging: Ir Veno/ext/uni Right  Result Date: 04/09/2016 INDICATION: CHF, central venous access for CVP monitoring, poor peripheral veins. EXAM: ULTRASOUND GUIDANCE FOR VASCULAR ACCESS RIGHT  UPPER EXTREMITY VENOGRAM ULTRASOUND AND FLUOROSCOPIC GUIDED LEFT UPPER EXTREMITY PICC LINE INSERTION MEDICATIONS: 1% LIDOCAINE LOCALLY CONTRAST:  10 cc Isovue 300 FLUOROSCOPY TIME:  2 minutes 18 seconds (23 mGy) COMPLICATIONS: None immediate. TECHNIQUE: The procedure, risks, benefits, and alternatives were explained to the patient and informed written consent was obtained. A timeout was performed prior to the initiation of the procedure. Initially, the right upper extremity was sterilely prepped and draped. Under sterile conditions and local anesthesia, right brachial vein access was performed. Guidewire would not advance centrally. Through the 5 French peel-away sheath, right upper extremity venogram performed. Right upper extremity venogram: This confirms central chronic appearing occlusion of the right subclavian and innominate veins. Therefore this access was removed. Hemostasis obtained with manual compression. Left upper extremity PICC line will be inserted. The left upper extremity was prepped with chlorhexidine in a sterile fashion, and a sterile drape was applied covering the operative field. Maximum barrier sterile technique with sterile gowns and gloves were used for the procedure. A timeout was performed prior to the initiation of the procedure. Local anesthesia was provided with 1% lidocaine. Under direct ultrasound guidance, the left basilic vein was accessed with a micropuncture kit after the overlying soft tissues were anesthetized with 1% lidocaine. An ultrasound image was saved for documentation purposes. A guidewire was advanced to the level of the superior caval-atrial junction for measurement purposes and the PICC line was cut to length. A peel-away sheath was placed and a 47 cm, 5 [place], dual lumen was inserted to level of the superior caval-atrial junction. A post procedure spot fluoroscopic was obtained. The catheter easily aspirated and flushed and was sutured in place. A dressing was  placed. The patient tolerated the procedure well without immediate post procedural complication. FINDINGS: Right upper extremity venogram confirms central chronic occlusion of the right subclavian and innominate veins. After left upper extremity PICC placement, the tip lies within the superior cavoatrial junction. The catheter aspirates and flushes normally and is ready for immediate use. 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 Successful ultrasound and fluoroscopic guided placement of a left basilic vein approach, 47 cm, 5 French, dual lumen PICC with tip at the superior caval-atrial junction. The PICC line is ready for immediate use. Electronically Signed   By: Jerilynn Mages.  Shick M.D.   On: 04/09/2016 09:35   Ir Fluoro Guide Cv Line Left  Result Date: 04/09/2016 INDICATION: CHF, central venous access for CVP monitoring, poor peripheral veins. EXAM: ULTRASOUND GUIDANCE FOR VASCULAR ACCESS RIGHT UPPER EXTREMITY VENOGRAM ULTRASOUND AND FLUOROSCOPIC GUIDED LEFT UPPER EXTREMITY PICC  LINE INSERTION MEDICATIONS: 1% LIDOCAINE LOCALLY CONTRAST:  10 cc Isovue 300 FLUOROSCOPY TIME:  2 minutes 18 seconds (23 mGy) COMPLICATIONS: None immediate. TECHNIQUE: The procedure, risks, benefits, and alternatives were explained to the patient and informed written consent was obtained. A timeout was performed prior to the initiation of the procedure. Initially, the right upper extremity was sterilely prepped and draped. Under sterile conditions and local anesthesia, right brachial vein access was performed. Guidewire would not advance centrally. Through the 5 French peel-away sheath, right upper extremity venogram performed. Right upper extremity venogram: This confirms central chronic appearing occlusion of the right subclavian and innominate veins. Therefore this access was removed. Hemostasis obtained with manual compression. Left upper extremity PICC line will be inserted. The left upper  extremity was prepped with chlorhexidine in a sterile fashion, and a sterile drape was applied covering the operative field. Maximum barrier sterile technique with sterile gowns and gloves were used for the procedure. A timeout was performed prior to the initiation of the procedure. Local anesthesia was provided with 1% lidocaine. Under direct ultrasound guidance, the left basilic vein was accessed with a micropuncture kit after the overlying soft tissues were anesthetized with 1% lidocaine. An ultrasound image was saved for documentation purposes. A guidewire was advanced to the level of the superior caval-atrial junction for measurement purposes and the PICC line was cut to length. A peel-away sheath was placed and a 47 cm, 5 [place], dual lumen was inserted to level of the superior caval-atrial junction. A post procedure spot fluoroscopic was obtained. The catheter easily aspirated and flushed and was sutured in place. A dressing was placed. The patient tolerated the procedure well without immediate post procedural complication. FINDINGS: Right upper extremity venogram confirms central chronic occlusion of the right subclavian and innominate veins. After left upper extremity PICC placement, the tip lies within the superior cavoatrial junction. The catheter aspirates and flushes normally and is ready for immediate use. 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 Successful ultrasound and fluoroscopic guided placement of a left basilic vein approach, 47 cm, 5 French, dual lumen PICC with tip at the superior caval-atrial junction. The PICC line is ready for immediate use. Electronically Signed   By: Jerilynn Mages.  Shick M.D.   On: 04/09/2016 09:35   Ir US Guide Vasc Access Left  Result Date: 04/09/2016 INDICATION: CHF, central venous access for CVP monitoring, poor peripheral veins. EXAM: ULTRASOUND GUIDANCE FOR VASCULAR ACCESS RIGHT UPPER EXTREMITY VENOGRAM ULTRASOUND AND  FLUOROSCOPIC GUIDED LEFT UPPER EXTREMITY PICC LINE INSERTION MEDICATIONS: 1% LIDOCAINE LOCALLY CONTRAST:  10 cc Isovue 300 FLUOROSCOPY TIME:  2 minutes 18 seconds (23 mGy) COMPLICATIONS: None immediate. TECHNIQUE: The procedure, risks, benefits, and alternatives were explained to the patient and informed written consent was obtained. A timeout was performed prior to the initiation of the procedure. Initially, the right upper extremity was sterilely prepped and draped. Under sterile conditions and local anesthesia, right brachial vein access was performed. Guidewire would not advance centrally. Through the 5 French peel-away sheath, right upper extremity venogram performed. Right upper extremity venogram: This confirms central chronic appearing occlusion of the right subclavian and innominate veins. Therefore this access was removed. Hemostasis obtained with manual compression. Left upper extremity PICC line will be inserted. The left upper extremity was prepped with chlorhexidine in a sterile fashion, and a sterile drape was applied covering the operative field. Maximum barrier sterile technique with sterile gowns and gloves were used for the procedure. A timeout was performed  prior to the initiation of the procedure. Local anesthesia was provided with 1% lidocaine. Under direct ultrasound guidance, the left basilic vein was accessed with a micropuncture kit after the overlying soft tissues were anesthetized with 1% lidocaine. An ultrasound image was saved for documentation purposes. A guidewire was advanced to the level of the superior caval-atrial junction for measurement purposes and the PICC line was cut to length. A peel-away sheath was placed and a 47 cm, 5 [place], dual lumen was inserted to level of the superior caval-atrial junction. A post procedure spot fluoroscopic was obtained. The catheter easily aspirated and flushed and was sutured in place. A dressing was placed. The patient tolerated the procedure  well without immediate post procedural complication. FINDINGS: Right upper extremity venogram confirms central chronic occlusion of the right subclavian and innominate veins. After left upper extremity PICC placement, the tip lies within the superior cavoatrial junction. The catheter aspirates and flushes normally and is ready for immediate use. 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 Successful ultrasound and fluoroscopic guided placement of a left basilic vein approach, 47 cm, 5 French, dual lumen PICC with tip at the superior caval-atrial junction. The PICC line is ready for immediate use. Electronically Signed   By: Jerilynn Mages.  Shick M.D.   On: 04/09/2016 09:35   Ir US Guide Vasc Access Right  Result Date: 04/09/2016 INDICATION: CHF, central venous access for CVP monitoring, poor peripheral veins. EXAM: ULTRASOUND GUIDANCE FOR VASCULAR ACCESS RIGHT UPPER EXTREMITY VENOGRAM ULTRASOUND AND FLUOROSCOPIC GUIDED LEFT UPPER EXTREMITY PICC LINE INSERTION MEDICATIONS: 1% LIDOCAINE LOCALLY CONTRAST:  10 cc Isovue 300 FLUOROSCOPY TIME:  2 minutes 18 seconds (23 mGy) COMPLICATIONS: None immediate. TECHNIQUE: The procedure, risks, benefits, and alternatives were explained to the patient and informed written consent was obtained. A timeout was performed prior to the initiation of the procedure. Initially, the right upper extremity was sterilely prepped and draped. Under sterile conditions and local anesthesia, right brachial vein access was performed. Guidewire would not advance centrally. Through the 5 French peel-away sheath, right upper extremity venogram performed. Right upper extremity venogram: This confirms central chronic appearing occlusion of the right subclavian and innominate veins. Therefore this access was removed. Hemostasis obtained with manual compression. Left upper extremity PICC line will be inserted. The left upper extremity was prepped with chlorhexidine in a  sterile fashion, and a sterile drape was applied covering the operative field. Maximum barrier sterile technique with sterile gowns and gloves were used for the procedure. A timeout was performed prior to the initiation of the procedure. Local anesthesia was provided with 1% lidocaine. Under direct ultrasound guidance, the left basilic vein was accessed with a micropuncture kit after the overlying soft tissues were anesthetized with 1% lidocaine. An ultrasound image was saved for documentation purposes. A guidewire was advanced to the level of the superior caval-atrial junction for measurement purposes and the PICC line was cut to length. A peel-away sheath was placed and a 47 cm, 5 [place], dual lumen was inserted to level of the superior caval-atrial junction. A post procedure spot fluoroscopic was obtained. The catheter easily aspirated and flushed and was sutured in place. A dressing was placed. The patient tolerated the procedure well without immediate post procedural complication. FINDINGS: Right upper extremity venogram confirms central chronic occlusion of the right subclavian and innominate veins. After left upper extremity PICC placement, the tip lies within the superior cavoatrial junction. The catheter aspirates and flushes normally and is ready for immediate use.  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 Successful ultrasound and fluoroscopic guided placement of a left basilic vein approach, 47 cm, 5 French, dual lumen PICC with tip at the superior caval-atrial junction. The PICC line is ready for immediate use. Electronically Signed   By: Jerilynn Mages.  Shick M.D.   On: 04/09/2016 09:35     Medications:     Scheduled Medications: . [MAR Hold] aspirin EC  81 mg Oral Daily  . [MAR Hold] digoxin  0.125 mg Oral Daily  . furosemide  80 mg Intravenous BID  . [MAR Hold] insulin aspart  0-20 Units Subcutaneous TID WC  . metolazone  2.5 mg Oral Once  . metoprolol  succinate  25 mg Oral BID  . [MAR Hold] potassium chloride  40 mEq Oral BID  . [MAR Hold] sodium chloride flush  3 mL Intravenous Q12H  . sodium chloride flush  3 mL Intravenous Q12H  . [MAR Hold] spironolactone  12.5 mg Oral Daily    Infusions: . sodium chloride 10 mL/hr at 04/11/16 0503  . amiodarone 30 mg/hr (04/10/16 2324)  . heparin Stopped (04/11/16 0700)  . heparin      PRN Medications: [MAR Hold] sodium chloride, sodium chloride, [MAR Hold] acetaminophen, amiodarone, fentaNYL, heparin, heparin, iopamidol, lidocaine (PF), midazolam, [MAR Hold] ondansetron (ZOFRAN) IV, Radial Cocktail/Verapamil only, [MAR Hold] sodium chloride flush, sodium chloride flush   Assessment/Plan   Ms Simm is a 51 year old admitted with A/C systolic heart failure, A fib/flutter, and new LAA clot noted on TEE. She has marked volume overload and hypotension. Admitted to ICU due to hypotension.   1. Acute on chronic systolic CHF: EF <00% on TEE. Cardiomyopathy known since 45. Possibly peri-partum. Cath around 2001 in [place] reportedly showed no coronary disease. SPEP negative.  Has [employer] ICD. She was admitted with volume overload/soft BP. She has felt progressively worse over the last few weeks. Low output by co-ox.  She was started on milrinone and diuresed very well, weight down. Milrinone stopped yesterday.  RHC today showed ongoing elevated filling pressures, R>L.  Cardiac output was preserved.  No coronary disease.  - She can stay off milrinone.  Continue digoxin.  - Rate control still a problem, add Toprol XL 25 mg bid.   - Hold home ACEI with elevated creatinine.  - Continue spironolactone 12.5 daily. - Lasix 80 mg IV bid today with a dose of metolazone.  2. Atrial flutter/fibrillation:  Just started warfarin about 3 weeks ago, does have LA appendage thrombus. HR elevated today in 120s-130s.  - Cannot cardiovert at this point with thrombus.  - On milrinone gtt now for rate  control.  - As above, adding Toprol XL and will titrate as able.   - Digoxin for rate control. - Will transition to Eliquis now that off warfarin, start 6 hours post-cath.  - Repeat TEE in about a month to see if thrombus has resolved with prolonged anticoagulation.  3. AKI: Creatinine better off lisinopril and with milrinone.   4. H/o VT: Has ICD. 5. Type II DM: SSI.   Loralie Champagne 04/11/2016 9:05 AM

## 2016-04-11 NOTE — Progress Notes (Signed)
[place] for >>apixaban Indication: atrial fibrillation  Allergies  Allergen Reactions  . Morphine And Related Nausea Only and Other (See Comments)    Feels sick,and like she is going to pass out; can't breathe, starts sweating    Patient Measurements: Height: 5\' 7"  (170.2 cm) Weight: (!) 300 lb 4.3 oz (136.2 kg) IBW/kg (Calculated) : 61.6  Vital Signs: Temp: 97.9 F (36.6 C) (01/12 0400) Temp Source: Oral (01/12 0400) BP: 96/35 (01/12 1000) Pulse Rate: 103 (01/12 1000)  Labs:  Recent Labs  04/08/16 1330  04/09/16 0206 04/10/16 0355 04/10/16 1300 04/10/16 2320 04/11/16 0532  HGB 11.6*  --   --  10.2*  --   --  10.3*  HCT 34.0*  --   --  33.4*  --   --  33.8*  PLT  --   --   --  266  --   --  264  LABPROT  --   < > 42.2* 26.7* 24.9*  --  22.3*  INR  --   < > 4.27* 2.41 2.21  --  1.92  HEPARINUNFRC  --   --   --   --   --  0.22*  --   CREATININE  --   < > 1.90* 1.59*  --   --  1.62*  < > = values in this interval not displayed.  Estimated Creatinine Clearance: 57.9 mL/min (by C-G formula based on SCr of 1.62 mg/dL (H)).   Medical History: Past Medical History:  Diagnosis Date  . AICD (automatic cardioverter/defibrillator) present 2002, 2010   secondary to post-partum cardiomyopathy (1996)  . CHF (congestive heart failure) ([place])   . Diabetes mellitus without complication ([place])   . Hypertension   . Hypertensive cardiovascular disease   . Obesity   . Postpartum cardiomyopathy    Last echocardiogram was last year and EF 45 -50 %  . Seasonal allergies   . Sleep apnea    complaint with CPAP  . Ventricular tachycardia ([facility])    s/p ICD Implantation ([facility] [facility])    Assessment: 51 year old female with atrial fibrillation diagnosed 3 weeks ago on admission at [facility] and Coumadin was started that admission. Patient now admitted 04/08/2016 to the ICU with hypotension and marked volume overload. Plan was for DCCV but  unable due to left atrial appendage clot found on TEE prior to procedure.  INR 1.9 this morning, now s/p cath. No complications noted, however new orders received to change anticoagulation to apixaban post cath. Plan to recheck echo in a month to evaluate LAA clot.    Goal of Therapy:  Monitor platelets by anticoagulation protocol: Yes   Plan:  Start apixaban 5mg  bid this afternoon Case mgmt consulted for cost information  Erin Hearing PharmD., BCPS Clinical Pharmacist Pager [phone] 04/11/2016 10:14 AM

## 2016-04-11 NOTE — Interval H&P Note (Signed)
History and Physical Interval Note:  04/11/2016 7:53 AM  Erin Jarvis  has presented today for surgery, with the diagnosis of heart failure  The various methods of treatment have been discussed with the patient and family. After consideration of risks, benefits and other options for treatment, the patient has consented to  Procedure(s): Right/Left Heart Cath and Coronary Angiography (N/A) as a surgical intervention .  The patient's history has been reviewed, patient examined, no change in status, stable for surgery.  I have reviewed the patient's chart and labs.  Questions were answered to the patient's satisfaction.     Markie Heffernan [employer]

## 2016-04-11 NOTE — Progress Notes (Signed)
Site area: Right groin a 7 french venous sheath was removed  Site Prior to Removal:  Level 0  Pressure Applied For 15 MINUTES    Bedrest Beginning at 1000am  Manual:   Yes.    Patient Status During Pull:  stable  Post Pull Groin Site:  Level 0  Post Pull Instructions Given:  Yes.    Post Pull Pulses Present:  Yes.    Dressing Applied:  Yes.    Comments:  VS remain stable

## 2016-04-11 NOTE — H&P (View-Only) (Signed)
Advanced Heart Failure Rounding Note   Subjective:   Admitted with marked volume overload from Endo with marked volume overload, LAA clot, and A fib. TEE EF ~20%.   Yesterday milrinone added. Todays CO-OX is 68%. Diuresing with IV lasix + metolazone. Brisk diuresis noted. Weight down 20 pounds.    Denies SOB. Feeling better.   Objective:   Weight Range:  Vital Signs:   Temp:  [97.3 F (36.3 C)-97.7 F (36.5 C)] 97.5 F (36.4 C) (01/11 0340) Pulse Rate:  [97-140] 140 (01/11 0340) Resp:  [10-18] 16 (01/11 0340) BP: (68-112)/(42-86) 95/64 (01/11 0340) SpO2:  [94 %-100 %] 98 % (01/11 0340) Weight:  [295 lb 12.8 oz (134.2 kg)] 295 lb 12.8 oz (134.2 kg) (01/11 0340) Last BM Date: 04/08/16  Weight change: Filed Weights   04/08/16 1715 04/10/16 0340  Weight: (!) 315 lb 4.1 oz (143 kg) 295 lb 12.8 oz (134.2 kg)    Intake/Output:   Intake/Output Summary (Last 24 hours) at 04/10/16 0737 Last data filed at 04/10/16 0600  Gross per 24 hour  Intake           353.33 ml  Output             8300 ml  Net         -7946.67 ml    Physical Exam: CVP 7-8 General:  Pale. Dyspneic at rest. In bed.  HEENT: normal Neck: Thick, supple. JVP 7-8. Carotids 2+ bilat; no bruits. No lymphadenopathy or thyromegaly appreciated. Cor: PMI nondisplaced. Irregular rate & rhythm. No rubs, gallops or murmurs. Lungs: clear Abdomen: soft, nontender, nondistended. No hepatosplenomegaly. No bruits or masses. Good bowel sounds. Extremities: no cyanosis, clubbing, rash, R and LLE 1 +edema RUE PICC  Neuro: alert & oriented x 3, cranial nerves grossly intact. moves all 4 extremities w/o difficulty. Affect pleasant.  Telemetry:  A fib/flutter 120s  Labs: Basic Metabolic Panel:  Recent Labs Lab 04/08/16 1330  04/08/16 1600 04/08/16 1834 04/09/16 0206 04/10/16 0355  NA 137  --  136 137 137 137  K 4.5  --  4.6 4.2 4.4 3.4*  CL  --   --  104 103 103 97*  CO2  --   --  '23 23 24 28  ' GLUCOSE 108*  --   111* 122* 124* 189*  BUN  --   --  50* 50* 51* 40*  CREATININE  --   --  1.80* 1.75* 1.90* 1.59*  CALCIUM  --   < > 8.9 9.3 9.0 9.3  MG  --   --  1.7  --   --  1.6*  < > = values in this interval not displayed.  Liver Function Tests:  Recent Labs Lab 04/08/16 1600 04/10/16 0355  AST 45* 35  ALT 90* 71*  ALKPHOS 130* 136*  BILITOT 0.2* 1.1  PROT 5.7* 6.3*  ALBUMIN 2.8* 3.1*   No results for input(s): LIPASE, AMYLASE in the last 168 hours. No results for input(s): AMMONIA in the last 168 hours.  CBC:  Recent Labs Lab 04/08/16 1330 04/10/16 0355  WBC  --  9.1  HGB 11.6* 10.2*  HCT 34.0* 33.4*  MCV  --  81.7  PLT  --  266    Cardiac Enzymes: No results for input(s): CKTOTAL, CKMB, CKMBINDEX, TROPONINI in the last 168 hours.  BNP: BNP (last 3 results)  Recent Labs  04/08/16 1834  BNP 601.2*    ProBNP (last 3 results) No results for input(s):  PROBNP in the last 8760 hours.    Other results:  Imaging: Ir Veno/ext/uni Right  Result Date: 04/09/2016 INDICATION: CHF, central venous access for CVP monitoring, poor peripheral veins. EXAM: ULTRASOUND GUIDANCE FOR VASCULAR ACCESS RIGHT UPPER EXTREMITY VENOGRAM ULTRASOUND AND FLUOROSCOPIC GUIDED LEFT UPPER EXTREMITY PICC LINE INSERTION MEDICATIONS: 1% LIDOCAINE LOCALLY CONTRAST:  10 cc Isovue 300 FLUOROSCOPY TIME:  2 minutes 18 seconds (23 mGy) COMPLICATIONS: None immediate. TECHNIQUE: The procedure, risks, benefits, and alternatives were explained to the patient and informed written consent was obtained. A timeout was performed prior to the initiation of the procedure. Initially, the right upper extremity was sterilely prepped and draped. Under sterile conditions and local anesthesia, right brachial vein access was performed. Guidewire would not advance centrally. Through the 5 French peel-away sheath, right upper extremity venogram performed. Right upper extremity venogram: This confirms central chronic appearing  occlusion of the right subclavian and innominate veins. Therefore this access was removed. Hemostasis obtained with manual compression. Left upper extremity PICC line will be inserted. The left upper extremity was prepped with chlorhexidine in a sterile fashion, and a sterile drape was applied covering the operative field. Maximum barrier sterile technique with sterile gowns and gloves were used for the procedure. A timeout was performed prior to the initiation of the procedure. Local anesthesia was provided with 1% lidocaine. Under direct ultrasound guidance, the left basilic vein was accessed with a micropuncture kit after the overlying soft tissues were anesthetized with 1% lidocaine. An ultrasound image was saved for documentation purposes. A guidewire was advanced to the level of the superior caval-atrial junction for measurement purposes and the PICC line was cut to length. A peel-away sheath was placed and a 47 cm, 5 [place], dual lumen was inserted to level of the superior caval-atrial junction. A post procedure spot fluoroscopic was obtained. The catheter easily aspirated and flushed and was sutured in place. A dressing was placed. The patient tolerated the procedure well without immediate post procedural complication. FINDINGS: Right upper extremity venogram confirms central chronic occlusion of the right subclavian and innominate veins. After left upper extremity PICC placement, the tip lies within the superior cavoatrial junction. The catheter aspirates and flushes normally and is ready for immediate use. 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 Successful ultrasound and fluoroscopic guided placement of a left basilic vein approach, 47 cm, 5 French, dual lumen PICC with tip at the superior caval-atrial junction. The PICC line is ready for immediate use. Electronically Signed   By: Jerilynn Mages.  Shick M.D.   On: 04/09/2016 09:35   Ir Fluoro Guide Cv Line Left  Result  Date: 04/09/2016 INDICATION: CHF, central venous access for CVP monitoring, poor peripheral veins. EXAM: ULTRASOUND GUIDANCE FOR VASCULAR ACCESS RIGHT UPPER EXTREMITY VENOGRAM ULTRASOUND AND FLUOROSCOPIC GUIDED LEFT UPPER EXTREMITY PICC LINE INSERTION MEDICATIONS: 1% LIDOCAINE LOCALLY CONTRAST:  10 cc Isovue 300 FLUOROSCOPY TIME:  2 minutes 18 seconds (23 mGy) COMPLICATIONS: None immediate. TECHNIQUE: The procedure, risks, benefits, and alternatives were explained to the patient and informed written consent was obtained. A timeout was performed prior to the initiation of the procedure. Initially, the right upper extremity was sterilely prepped and draped. Under sterile conditions and local anesthesia, right brachial vein access was performed. Guidewire would not advance centrally. Through the 5 French peel-away sheath, right upper extremity venogram performed. Right upper extremity venogram: This confirms central chronic appearing occlusion of the right subclavian and innominate veins. Therefore this access was removed. Hemostasis obtained with manual  compression. Left upper extremity PICC line will be inserted. The left upper extremity was prepped with chlorhexidine in a sterile fashion, and a sterile drape was applied covering the operative field. Maximum barrier sterile technique with sterile gowns and gloves were used for the procedure. A timeout was performed prior to the initiation of the procedure. Local anesthesia was provided with 1% lidocaine. Under direct ultrasound guidance, the left basilic vein was accessed with a micropuncture kit after the overlying soft tissues were anesthetized with 1% lidocaine. An ultrasound image was saved for documentation purposes. A guidewire was advanced to the level of the superior caval-atrial junction for measurement purposes and the PICC line was cut to length. A peel-away sheath was placed and a 47 cm, 5 [place], dual lumen was inserted to level of the superior  caval-atrial junction. A post procedure spot fluoroscopic was obtained. The catheter easily aspirated and flushed and was sutured in place. A dressing was placed. The patient tolerated the procedure well without immediate post procedural complication. FINDINGS: Right upper extremity venogram confirms central chronic occlusion of the right subclavian and innominate veins. After left upper extremity PICC placement, the tip lies within the superior cavoatrial junction. The catheter aspirates and flushes normally and is ready for immediate use. 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 Successful ultrasound and fluoroscopic guided placement of a left basilic vein approach, 47 cm, 5 French, dual lumen PICC with tip at the superior caval-atrial junction. The PICC line is ready for immediate use. Electronically Signed   By: Jerilynn Mages.  Shick M.D.   On: 04/09/2016 09:35   Ir US Guide Vasc Access Left  Result Date: 04/09/2016 INDICATION: CHF, central venous access for CVP monitoring, poor peripheral veins. EXAM: ULTRASOUND GUIDANCE FOR VASCULAR ACCESS RIGHT UPPER EXTREMITY VENOGRAM ULTRASOUND AND FLUOROSCOPIC GUIDED LEFT UPPER EXTREMITY PICC LINE INSERTION MEDICATIONS: 1% LIDOCAINE LOCALLY CONTRAST:  10 cc Isovue 300 FLUOROSCOPY TIME:  2 minutes 18 seconds (23 mGy) COMPLICATIONS: None immediate. TECHNIQUE: The procedure, risks, benefits, and alternatives were explained to the patient and informed written consent was obtained. A timeout was performed prior to the initiation of the procedure. Initially, the right upper extremity was sterilely prepped and draped. Under sterile conditions and local anesthesia, right brachial vein access was performed. Guidewire would not advance centrally. Through the 5 French peel-away sheath, right upper extremity venogram performed. Right upper extremity venogram: This confirms central chronic appearing occlusion of the right subclavian and innominate  veins. Therefore this access was removed. Hemostasis obtained with manual compression. Left upper extremity PICC line will be inserted. The left upper extremity was prepped with chlorhexidine in a sterile fashion, and a sterile drape was applied covering the operative field. Maximum barrier sterile technique with sterile gowns and gloves were used for the procedure. A timeout was performed prior to the initiation of the procedure. Local anesthesia was provided with 1% lidocaine. Under direct ultrasound guidance, the left basilic vein was accessed with a micropuncture kit after the overlying soft tissues were anesthetized with 1% lidocaine. An ultrasound image was saved for documentation purposes. A guidewire was advanced to the level of the superior caval-atrial junction for measurement purposes and the PICC line was cut to length. A peel-away sheath was placed and a 47 cm, 5 [place], dual lumen was inserted to level of the superior caval-atrial junction. A post procedure spot fluoroscopic was obtained. The catheter easily aspirated and flushed and was sutured in place. A dressing was placed. The patient tolerated the  procedure well without immediate post procedural complication. FINDINGS: Right upper extremity venogram confirms central chronic occlusion of the right subclavian and innominate veins. After left upper extremity PICC placement, the tip lies within the superior cavoatrial junction. The catheter aspirates and flushes normally and is ready for immediate use. 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 Successful ultrasound and fluoroscopic guided placement of a left basilic vein approach, 47 cm, 5 French, dual lumen PICC with tip at the superior caval-atrial junction. The PICC line is ready for immediate use. Electronically Signed   By: Jerilynn Mages.  Shick M.D.   On: 04/09/2016 09:35   Ir US Guide Vasc Access Right  Result Date: 04/09/2016 INDICATION: CHF, central venous  access for CVP monitoring, poor peripheral veins. EXAM: ULTRASOUND GUIDANCE FOR VASCULAR ACCESS RIGHT UPPER EXTREMITY VENOGRAM ULTRASOUND AND FLUOROSCOPIC GUIDED LEFT UPPER EXTREMITY PICC LINE INSERTION MEDICATIONS: 1% LIDOCAINE LOCALLY CONTRAST:  10 cc Isovue 300 FLUOROSCOPY TIME:  2 minutes 18 seconds (23 mGy) COMPLICATIONS: None immediate. TECHNIQUE: The procedure, risks, benefits, and alternatives were explained to the patient and informed written consent was obtained. A timeout was performed prior to the initiation of the procedure. Initially, the right upper extremity was sterilely prepped and draped. Under sterile conditions and local anesthesia, right brachial vein access was performed. Guidewire would not advance centrally. Through the 5 French peel-away sheath, right upper extremity venogram performed. Right upper extremity venogram: This confirms central chronic appearing occlusion of the right subclavian and innominate veins. Therefore this access was removed. Hemostasis obtained with manual compression. Left upper extremity PICC line will be inserted. The left upper extremity was prepped with chlorhexidine in a sterile fashion, and a sterile drape was applied covering the operative field. Maximum barrier sterile technique with sterile gowns and gloves were used for the procedure. A timeout was performed prior to the initiation of the procedure. Local anesthesia was provided with 1% lidocaine. Under direct ultrasound guidance, the left basilic vein was accessed with a micropuncture kit after the overlying soft tissues were anesthetized with 1% lidocaine. An ultrasound image was saved for documentation purposes. A guidewire was advanced to the level of the superior caval-atrial junction for measurement purposes and the PICC line was cut to length. A peel-away sheath was placed and a 47 cm, 5 [place], dual lumen was inserted to level of the superior caval-atrial junction. A post procedure spot fluoroscopic  was obtained. The catheter easily aspirated and flushed and was sutured in place. A dressing was placed. The patient tolerated the procedure well without immediate post procedural complication. FINDINGS: Right upper extremity venogram confirms central chronic occlusion of the right subclavian and innominate veins. After left upper extremity PICC placement, the tip lies within the superior cavoatrial junction. The catheter aspirates and flushes normally and is ready for immediate use. 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 Successful ultrasound and fluoroscopic guided placement of a left basilic vein approach, 47 cm, 5 French, dual lumen PICC with tip at the superior caval-atrial junction. The PICC line is ready for immediate use. Electronically Signed   By: Jerilynn Mages.  Shick M.D.   On: 04/09/2016 09:35     Medications:     Scheduled Medications: . amiodarone  200 mg Oral Daily  . aspirin EC  81 mg Oral Daily  . furosemide  80 mg Intravenous BID  . insulin aspart  0-20 Units Subcutaneous TID WC  . sodium chloride flush  3 mL Intravenous Q12H  . Warfarin -  Pharmacist Dosing Inpatient   Does not apply q1800    Infusions: . milrinone 0.25 mcg/kg/min (04/10/16 0011)  . norepinephrine (LEVOPHED) Adult infusion      PRN Medications: sodium chloride, acetaminophen, lidocaine, ondansetron (ZOFRAN) IV, sodium chloride flush   Assessment/Plan   Ms Pascoe is a 51 year old admitted with A/C systolic heart failure, A fib/flutter, and new LAA clot noted on TEE. She has marked volume overload and hypotension. Admit to ICU due to hypotension.   1. A/C Systolic Heart Failure - Post Partum cardiomyopathy 1996. [place] 2001 no CAD. ICD Medtronic. TEE LVEF < 20%. ECHO 04/09/16 EF 25-30% akinesis anteroseptal and   inferoseptal myocardium. RV mildly dilated.  Low voltage noted on monitor. Check SPEP/UPEP pending.   Today CO-OX is 68% on milrinone 0.25 mcg. Tachy so will  cut back milrinone to 0.125 mcg.  Volume status improved. Stop metolazone. Continue IV lasix one more day.   SBP soft. No bb for now due to suspected low output. No Ace/ARNi Add 12.5 mg spiro daily.   Renal function trending down.  Creatinine 1.9.>1.5.  Add digoxin now.  2. A Fib/Flutter- rate controlled. Hold on amio drip. Increase amio to 200 mg twice a day. Rate controlled.  Unable to complete DC-CV due to LAA clot. Hold coumadin tonight. Pharmacy to dose coumadin. Todays INR 2.41. Will need heparin once INR 2.2 or below, repeat this evening.  3. LAA Clot- on coumadin as above.  4. H/O VT-keep K .4 and Mag >2.   K 3.4 Mag 1.6. Give 4 grams Mag now.  5. DMII- hold metformin with recently elevated creatinine. Place on sliding scale.  Hgb A1C 6.9.  6. Morbid Obesity 7. TSH - elevated 5.137. Check T3 T4.   Plan for RHC with LHC as long as creatinine <1.5.  Length of Stay: 2  Amy Clegg NP-C  04/10/2016, 7:37 AM  Advanced Heart Failure Team Pager [phone] (M-F; 7a - 4p)  Please contact [facility] for night-coverage after hours (4p -7a ) and weekends on [email or web address]  1. Acute on chronic systolic CHF: EF <50% on TEE. Nonischemic cardiomyopathy, known since 60. Possibly peri-partum. Cath around 2001 in [place] reportedly showed no coronary disease. Has Medtronic ICD. She was admitted with volume overload/soft BP. She has felt progressively worse over the last few weeks. Low output by co-ox.  She was started on milrinone and diuresed very well yesterday.  Weight now down about 20 lbs. Today, co-ox 68% with CVP now 7-8. Creatinine better.  - HR in 120s-130s in atrial fibrillation, will decrease milrinone to 0.125 this morning.  - Add digoxin 0.125 daily.  - Stop metolazone, can get IV Lasix at least this morning.  - Hold home ACEI and metoprolol.  - Can start spironolactone 12.5 daily.  - Low voltage ECG, sent SPEP.  - RHC/LHC when more diuresed => possibly tomorrow.  2.  Atrial flutter/fibrillation:  Just started warfarin about 3 weeks ago, does have LA appendage thrombus. HR elevated today in 120s, likely due to milrinone.  - Cannot cardiovert at this point with thrombus.  - Can continue amiodarone to load in anticipation of future DCCV, increase to 200 bid to help with rate.  Also decreasing milrinone today.  - Digoxin for rate control. - heparin gtt when INR 2.2 or below given thrombus (plan to hold heparin briefly for RHC/LHC tomorrow).    - Repeat TEE in about a month to see if thrombus has resolved with prolonged warfarin use.  3.  AKI: Creatinine better off lisinopril and with milrinone.    Loralie Champagne 04/10/2016 8:15 AM

## 2016-04-11 NOTE — Progress Notes (Signed)
[place] for heparin Indication: atrial fibrillation  Allergies  Allergen Reactions  . Morphine And Related Nausea Only and Other (See Comments)    Feels sick,and like she is going to pass out; can't breathe, starts sweating    Patient Measurements: Height: 5\' 7"  (170.2 cm) Weight: 295 lb 12.8 oz (134.2 kg) IBW/kg (Calculated) : 61.6  Heparin dosing wt: 95 kg  Vital Signs: Temp: 97.9 F (36.6 C) (01/11 2318) Temp Source: Oral (01/11 2318) BP: 95/45 (01/11 2100) Pulse Rate: 104 (01/11 2100)  Labs:  Recent Labs  04/08/16 1330  04/08/16 1834 04/09/16 0206 04/10/16 0355 04/10/16 1300 04/10/16 2320  HGB 11.6*  --   --   --  10.2*  --   --   HCT 34.0*  --   --   --  33.4*  --   --   PLT  --   --   --   --  266  --   --   LABPROT  --   < > 37.4* 42.2* 26.7* 24.9*  --   INR  --   < > 3.67 4.27* 2.41 2.21  --   HEPARINUNFRC  --   --   --   --   --   --  0.22*  CREATININE  --   < > 1.75* 1.90* 1.59*  --   --   < > = values in this interval not displayed.  Estimated Creatinine Clearance: 58.5 mL/min (by C-G formula based on SCr of 1.59 mg/dL (H)).  Assessment: 51 year old female with atrial fibrillation diagnosed 3 weeks ago on admission at [facility] and Coumadin was started that admission. Patient now admitted 04/08/2016 to the ICU with hypotension and marked volume overload. Plan was for DCCV but unable due to left atrial appendage clot found on TEE prior to procedure.  INR 2.4 this morning and 2.2 this afternoon. New plan to take for [facility] tomorrow as long as scr is stable.   Per discussion with cardiology will start heparin when INR close to 2.2 with LA clot noted earlier this week. Heparin level subtherapeutic (0.22) on gtt at 1400 units/hr. No issues with line or bleeding reported per RN.  Home Coumadin dose was 10 mg daily at 6 PM- last dose was 04/07/16  Goal of Therapy:  INR 2.5-3; heparin level 0.3-0.7 units/ml Monitor  platelets by anticoagulation protocol: Yes   Plan:  Increase heparin to 1600 units/hour Check 6 hour heparin level  Sherlon Handing, PharmD, BCPS Clinical pharmacist, pager [phone] 04/11/2016 12:07 AM

## 2016-04-12 LAB — BASIC METABOLIC PANEL
ANION GAP: 10 (ref 5–15)
BUN: 31 mg/dL — ABNORMAL HIGH (ref 6–20)
CALCIUM: 9.5 mg/dL (ref 8.9–10.3)
CO2: 31 mmol/L (ref 22–32)
Chloride: 96 mmol/L — ABNORMAL LOW (ref 101–111)
Creatinine, Ser: 1.7 mg/dL — ABNORMAL HIGH (ref 0.44–1.00)
GFR calc Af Amer: 39 mL/min — ABNORMAL LOW (ref >60)
GFR, EST NON AFRICAN AMERICAN: 33 mL/min — AB (ref >60)
Glucose, Bld: 111 mg/dL — ABNORMAL HIGH (ref 65–99)
POTASSIUM: 4.3 mmol/L (ref 3.5–5.1)
Sodium: 137 mmol/L (ref 135–145)

## 2016-04-12 LAB — COOXEMETRY PANEL
CARBOXYHEMOGLOBIN: 1.4 % (ref 0.5–1.5)
METHEMOGLOBIN: 0.9 % (ref 0.0–1.5)
O2 Saturation: 65.5 %
Total hemoglobin: 11.1 g/dL — ABNORMAL LOW (ref 12.0–16.0)

## 2016-04-12 LAB — CBC
HEMATOCRIT: 36.3 % (ref 36.0–46.0)
HEMOGLOBIN: 10.8 g/dL — AB (ref 12.0–15.0)
MCH: 24.8 pg — ABNORMAL LOW (ref 26.0–34.0)
MCHC: 29.8 g/dL — AB (ref 30.0–36.0)
MCV: 83.4 fL (ref 78.0–100.0)
Platelets: 262 10*3/uL (ref 150–400)
RBC: 4.35 MIL/uL (ref 3.87–5.11)
RDW: 16.4 % — ABNORMAL HIGH (ref 11.5–15.5)
WBC: 7.8 10*3/uL (ref 4.0–10.5)

## 2016-04-12 LAB — GLUCOSE, CAPILLARY
GLUCOSE-CAPILLARY: 129 mg/dL — AB (ref 65–99)
Glucose-Capillary: 149 mg/dL — ABNORMAL HIGH (ref 65–99)
Glucose-Capillary: 186 mg/dL — ABNORMAL HIGH (ref 65–99)
Glucose-Capillary: 106 mg/dL — ABNORMAL HIGH (ref 65–99)

## 2016-04-12 LAB — PROTIME-INR
INR: 1.66
Prothrombin Time: 19.8 seconds — ABNORMAL HIGH (ref 11.4–15.2)

## 2016-04-12 MED ORDER — METOPROLOL SUCCINATE ER 50 MG PO TB24
50.0000 mg | ORAL_TABLET | Freq: Two times a day (BID) | ORAL | Status: DC
  Administered 2016-04-12 – 2016-04-13 (×2): 50 mg via ORAL
  Filled 2016-04-12 (×2): qty 1

## 2016-04-12 MED ORDER — METOLAZONE 2.5 MG PO TABS
2.5000 mg | ORAL_TABLET | Freq: Once | ORAL | Status: AC
  Administered 2016-04-12: 2.5 mg via ORAL
  Filled 2016-04-12: qty 1

## 2016-04-12 MED ORDER — METOPROLOL SUCCINATE ER 25 MG PO TB24
25.0000 mg | ORAL_TABLET | Freq: Once | ORAL | Status: AC
  Administered 2016-04-12: 25 mg via ORAL
  Filled 2016-04-12: qty 1

## 2016-04-12 NOTE — Progress Notes (Addendum)
CARDIAC REHAB PHASE I   PRE:  Rate/Rhythm: 106 A Fib  BP:  Supine:   Sitting: 114/77  Standing:    SaO2: 97% RA  MODE:  Ambulation: 470 ft   POST:  Rate/Rhythm: 129 A Fib  BP:  Supine:   Sitting: 105/72  Standing:    SaO2: 94% RA  Tolerated ambulation well after walking around the entire unit, no complaints, but did drop blood pressure slightly, no complaints.  Instructed to walk 3 times daily while in hospital.  Heart rate increased to 129 with ambulation. [id number]  Liliane Channel RN, BSN 04/12/2016 2:51 PM

## 2016-04-12 NOTE — Progress Notes (Signed)
Patient ID: Erin Jarvis, female   DOB: 09-26-1965, 51 y.o.   MRN: [id number]    Advanced Heart Failure Rounding Note   Subjective:    Admitted with marked volume overload from Endo with marked volume overload, LAA clot, and A fib with RVR. TEE EF ~20%.   She was started on milrinone with low co-ox and diuresed with good weight loss and UOP.  Milrinone stopped 1/11.  Lasix/metolazone continued yesterday, weight down again with good UOP.  Creatinine stable at 1.7.  CVP 13 today with co-ox 66%.  Toprol XL started, HR better in 100s.      Breathing improving.   LHC/RHC 1/12: Dominance: Right   Left Main  Short, no angiographic CAD.  Left Anterior Descending  No angiographic CAD.  Ramus Intermedius  Moderate vessel, no angiographic CAD.  Left Circumflex  No angiographic CAD.  Right Coronary Artery  No angiographic CAD.  Right Heart  Right Heart Pressures RHC Procedural Findings: Hemodynamics (mmHg) RA mean 19 RV 45/16 PA 46/20, mean 35 PCWP mean 24 LV 108/21 AO 111/75  Oxygen saturations: PA 66% AO 96%  Cardiac Output (Fick) 7.56  Cardiac Index (Fick) 3.16 PVR 1.4 WU  Cardiac Output (Thermo) 5.66 Cardiac Index (Thermo) 2.37  PVR 1.9 WU     Objective:   Weight Range:  Vital Signs:   Temp:  [97.5 F (36.4 C)-98.2 F (36.8 C)] 97.5 F (36.4 C) (01/13 0739) Pulse Rate:  [94-107] 105 (01/12 1938) Resp:  [11-20] 11 (01/13 0739) BP: (90-143)/(35-101) 106/74 (01/13 0739) SpO2:  [91 %-100 %] 97 % (01/13 0739) Weight:  [286 lb 3.2 oz (129.8 kg)] 286 lb 3.2 oz (129.8 kg) (01/13 0500) Last BM Date: 04/11/16  Weight change: Filed Weights   04/10/16 0340 04/11/16 0400 04/12/16 0500  Weight: 295 lb 12.8 oz (134.2 kg) (!) 300 lb 4.3 oz (136.2 kg) 286 lb 3.2 oz (129.8 kg)    Intake/Output:   Intake/Output Summary (Last 24 hours) at 04/12/16 0932 Last data filed at 04/12/16 0800  Gross per 24 hour  Intake           1420.5 ml  Output             5350 ml  Net           -3929.5 ml    Physical Exam: General:  NAD  HEENT: normal Neck: Thick, supple. JVP 12. Carotids 2+ bilat; no bruits. No lymphadenopathy or thyromegaly appreciated. Cor: PMI nondisplaced. Tachy, irregular rate & rhythm. No rubs, gallops or murmurs. Lungs: clear Abdomen: soft, nontender, nondistended. No hepatosplenomegaly. No bruits or masses. Good bowel sounds. Extremities: no cyanosis, clubbing, rash.  1+ ankle edema Neuro: alert & oriented x 3, cranial nerves grossly intact. moves all 4 extremities w/o difficulty. Affect pleasant.  Telemetry:  Atrial fibrillation 100s  Labs: Basic Metabolic Panel:  Recent Labs Lab 04/08/16 1600 04/08/16 1834 04/09/16 0206 04/10/16 0355 04/11/16 0532 04/12/16 0500  NA 136 137 137 137 138 137  K 4.6 4.2 4.4 3.4* 4.2 4.3  CL 104 103 103 97* 98* 96*  CO2 23 23 24 28 27 31   GLUCOSE 111* 122* 124* 189* 113* 111*  BUN 50* 50* 51* 40* 30* 31*  CREATININE 1.80* 1.75* 1.90* 1.59* 1.62* 1.70*  CALCIUM 8.9 9.3 9.0 9.3 9.1 9.5  MG 1.7  --   --  1.6* 2.2  --     Liver Function Tests:  Recent Labs Lab 04/08/16 1600 04/10/16 0355  AST 45*  35  ALT 90* 71*  ALKPHOS 130* 136*  BILITOT 0.2* 1.1  PROT 5.7* 6.3*  ALBUMIN 2.8* 3.1*   No results for input(s): LIPASE, AMYLASE in the last 168 hours. No results for input(s): AMMONIA in the last 168 hours.  CBC:  Recent Labs Lab 04/08/16 1330 04/10/16 0355 04/11/16 0532 04/12/16 0500  WBC  --  9.1 6.9 7.8  HGB 11.6* 10.2* 10.3* 10.8*  HCT 34.0* 33.4* 33.8* 36.3  MCV  --  81.7 81.6 83.4  PLT  --  266 264 262    Cardiac Enzymes: No results for input(s): CKTOTAL, CKMB, CKMBINDEX, TROPONINI in the last 168 hours.  BNP: BNP (last 3 results)  Recent Labs  04/08/16 1834  BNP 601.2*    ProBNP (last 3 results) No results for input(s): PROBNP in the last 8760 hours.    Other results:  Imaging: No results found.   Medications:     Scheduled Medications: . apixaban  5  mg Oral BID  . digoxin  0.125 mg Oral Daily  . furosemide  80 mg Intravenous BID  . insulin aspart  0-20 Units Subcutaneous TID WC  . metolazone  2.5 mg Oral Once  . metoprolol succinate  25 mg Oral Once  . metoprolol succinate  50 mg Oral BID  . potassium chloride  40 mEq Oral BID  . sodium chloride flush  10-40 mL Intracatheter Q12H  . sodium chloride flush  3 mL Intravenous Q12H  . spironolactone  12.5 mg Oral Daily    Infusions: . amiodarone 30 mg/hr (04/12/16 0012)    PRN Medications: sodium chloride, acetaminophen, ondansetron (ZOFRAN) IV, sodium chloride flush, sodium chloride flush   Assessment/Plan   Ms Crincoli is a 51 year old admitted with A/C systolic heart failure, A fib/flutter, and new LAA clot noted on TEE. She has marked volume overload and hypotension. Admitted to ICU due to hypotension.   1. Acute on chronic systolic CHF: EF 99991111 on TEE. Cardiomyopathy known since 51. Possibly peri-partum. Cath around 2001 in [place] reportedly showed no coronary disease. SPEP negative.  Has Medtronic ICD. She was admitted with volume overload/soft BP. She has felt progressively worse over the last few weeks. Low output by co-ox initially.  She was started on milrinone and diuresed very well, weight down. Milrinone stopped 1/11  RHC 1/12 showed ongoing elevated filling pressures, R>L.  Cardiac output was preserved.  No coronary disease. Lasix IV and metolazone restarted. CVP 13, co-ox 66%.  - She can stay off milrinone.  Continue digoxin.  - Rate control remains difficult, increase Toprol XL to 50 mg bid.   - Hold home ACEI with elevated creatinine.  - Continue spironolactone 12.5 daily. - Lasix 80 mg IV bid today with a dose of metolazone.  2. Atrial flutter/fibrillation:  Just started warfarin about 3 weeks ago, does have LA appendage thrombus. Rate control difficult, improving with Toprol XL and digoxin.   - Cannot cardiovert at this point with thrombus.  - On  amiodarone gtt for rate control, continue for now as we titrate up Toprol XL.   - Increase Toprol XL to 50 bid.   - Digoxin for rate control. - Transitioned to Eliquis.  - Repeat TEE in about a month to see if thrombus has resolved with prolonged anticoagulation.  3. AKI: Creatinine better off lisinopril and with milrinone. Stable this morning compared to yesterday.   4. H/o VT: Has ICD. 5. Type II DM: SSI.   Loralie Champagne 04/12/2016 9:32  AM

## 2016-04-12 NOTE — Progress Notes (Signed)
Pharmacist Heart Failure Core Measure Documentation  Assessment: Erin Jarvis has an EF documented as 25%  by ECHO.  Rationale: Heart failure patients with left ventricular systolic dysfunction (LVSD) and an EF < 40% should be prescribed an angiotensin converting enzyme inhibitor (ACEI) or angiotensin receptor blocker (ARB) at discharge unless a contraindication is documented in the medical record.  This patient is not currently on an ACEI or ARB for HF.  This note is being placed in the record in order to provide documentation that a contraindication to the use of these agents is present for this encounter.  ACE Inhibitor or Angiotensin Receptor Blocker is contraindicated (specify all that apply)  []   ACEI allergy AND ARB allergy []   Angioedema []   Moderate or severe aortic stenosis []   Hyperkalemia [x]   Hypotension []   Renal artery stenosis [x]   Worsening renal function, preexisting renal disease or dysfunction   Bonnita Nasuti Pharm.D. CPP, BCPS Clinical Pharmacist [phone] 04/12/2016 3:09 PM

## 2016-04-13 LAB — CBC
HEMATOCRIT: 36.8 % (ref 36.0–46.0)
HEMOGLOBIN: 11.2 g/dL — AB (ref 12.0–15.0)
MCH: 24.9 pg — ABNORMAL LOW (ref 26.0–34.0)
MCHC: 30.4 g/dL (ref 30.0–36.0)
MCV: 82 fL (ref 78.0–100.0)
Platelets: 266 10*3/uL (ref 150–400)
RBC: 4.49 MIL/uL (ref 3.87–5.11)
RDW: 16.2 % — AB (ref 11.5–15.5)
WBC: 8.2 10*3/uL (ref 4.0–10.5)

## 2016-04-13 LAB — BASIC METABOLIC PANEL
ANION GAP: 13 (ref 5–15)
BUN: 37 mg/dL — ABNORMAL HIGH (ref 6–20)
CHLORIDE: 91 mmol/L — AB (ref 101–111)
CO2: 29 mmol/L (ref 22–32)
Calcium: 9.7 mg/dL (ref 8.9–10.3)
Creatinine, Ser: 1.86 mg/dL — ABNORMAL HIGH (ref 0.44–1.00)
GFR, EST AFRICAN AMERICAN: 35 mL/min — AB (ref >60)
GFR, EST NON AFRICAN AMERICAN: 30 mL/min — AB (ref >60)
Glucose, Bld: 175 mg/dL — ABNORMAL HIGH (ref 65–99)
POTASSIUM: 4.4 mmol/L (ref 3.5–5.1)
SODIUM: 133 mmol/L — AB (ref 135–145)

## 2016-04-13 LAB — COOXEMETRY PANEL
Carboxyhemoglobin: 1.5 % (ref 0.5–1.5)
METHEMOGLOBIN: 1 % (ref 0.0–1.5)
O2 Saturation: 68.7 %
Total hemoglobin: 10.2 g/dL — ABNORMAL LOW (ref 12.0–16.0)

## 2016-04-13 LAB — GLUCOSE, CAPILLARY
GLUCOSE-CAPILLARY: 136 mg/dL — AB (ref 65–99)
GLUCOSE-CAPILLARY: 146 mg/dL — AB (ref 65–99)
GLUCOSE-CAPILLARY: 157 mg/dL — AB (ref 65–99)
Glucose-Capillary: 110 mg/dL — ABNORMAL HIGH (ref 65–99)
Glucose-Capillary: 127 mg/dL — ABNORMAL HIGH (ref 65–99)

## 2016-04-13 MED ORDER — AMIODARONE HCL 200 MG PO TABS
200.0000 mg | ORAL_TABLET | Freq: Two times a day (BID) | ORAL | Status: DC
  Administered 2016-04-13 (×2): 200 mg via ORAL
  Filled 2016-04-13 (×2): qty 1

## 2016-04-13 MED ORDER — METOPROLOL SUCCINATE ER 50 MG PO TB24
75.0000 mg | ORAL_TABLET | Freq: Two times a day (BID) | ORAL | Status: DC
  Administered 2016-04-13 – 2016-04-14 (×2): 75 mg via ORAL
  Filled 2016-04-13 (×2): qty 1

## 2016-04-13 MED ORDER — METOPROLOL SUCCINATE ER 25 MG PO TB24
25.0000 mg | ORAL_TABLET | Freq: Once | ORAL | Status: AC
  Administered 2016-04-13: 25 mg via ORAL
  Filled 2016-04-13: qty 1

## 2016-04-13 MED ORDER — METOLAZONE 2.5 MG PO TABS
2.5000 mg | ORAL_TABLET | Freq: Once | ORAL | Status: AC
  Administered 2016-04-13: 2.5 mg via ORAL
  Filled 2016-04-13: qty 1

## 2016-04-13 NOTE — Progress Notes (Signed)
Patient ID: Erin Jarvis, female   DOB: Aug 17, 1965, 51 y.o.   MRN: [id number]    Advanced Heart Failure Rounding Note   Subjective:    Admitted with marked volume overload from Endo with marked volume overload, LAA clot, and A fib with RVR. TEE EF ~20%.   She was started on milrinone with low co-ox and diuresed with good weight loss and UOP.  Milrinone stopped 1/11.  Lasix/metolazone continued yesterday, weight another 5 lbs with good UOP.  Creatinine up a bit to 1.86.  CVP 12 today with co-ox 69%.  HR remains in 100s.       Breathing improving.   LHC/RHC 1/12: Dominance: Right   Left Main  Short, no angiographic CAD.  Left Anterior Descending  No angiographic CAD.  Ramus Intermedius  Moderate vessel, no angiographic CAD.  Left Circumflex  No angiographic CAD.  Right Coronary Artery  No angiographic CAD.  Right Heart  Right Heart Pressures RHC Procedural Findings: Hemodynamics (mmHg) RA mean 19 RV 45/16 PA 46/20, mean 35 PCWP mean 24 LV 108/21 AO 111/75  Oxygen saturations: PA 66% AO 96%  Cardiac Output (Fick) 7.56  Cardiac Index (Fick) 3.16 PVR 1.4 WU  Cardiac Output (Thermo) 5.66 Cardiac Index (Thermo) 2.37  PVR 1.9 WU     Objective:   Weight Range:  Vital Signs:   Temp:  [97.5 F (36.4 C)-98.5 F (36.9 C)] 97.5 F (36.4 C) (01/14 0745) Pulse Rate:  [88-116] 99 (01/14 0745) Resp:  [10-27] 10 (01/14 0745) BP: (92-117)/(55-79) 105/69 (01/14 0745) SpO2:  [94 %-99 %] 97 % (01/14 0745) Weight:  [281 lb 4.9 oz (127.6 kg)] 281 lb 4.9 oz (127.6 kg) (01/14 0440) Last BM Date: 04/12/16  Weight change: Filed Weights   04/11/16 0400 04/12/16 0500 04/13/16 0440  Weight: (!) 300 lb 4.3 oz (136.2 kg) 286 lb 3.2 oz (129.8 kg) 281 lb 4.9 oz (127.6 kg)    Intake/Output:   Intake/Output Summary (Last 24 hours) at 04/13/16 0916 Last data filed at 04/13/16 0746  Gross per 24 hour  Intake           324.55 ml  Output             2425 ml  Net          -2100.45 ml    Physical Exam: General:  NAD  HEENT: normal Neck: Thick, supple. JVP 12. Carotids 2+ bilat; no bruits. No lymphadenopathy or thyromegaly appreciated. Cor: PMI nondisplaced. Tachy, irregular rate & rhythm. No rubs, gallops or murmurs. Lungs: clear Abdomen: soft, nontender, nondistended. No hepatosplenomegaly. No bruits or masses. Good bowel sounds. Extremities: no cyanosis, clubbing, rash.  1+ ankle edema Neuro: alert & oriented x 3, cranial nerves grossly intact. moves all 4 extremities w/o difficulty. Affect pleasant.  Telemetry:  Atrial fibrillation 100s  Labs: Basic Metabolic Panel:  Recent Labs Lab 04/08/16 1600  04/09/16 0206 04/10/16 0355 04/11/16 0532 04/12/16 0500 04/13/16 0400  NA 136  < > 137 137 138 137 133*  K 4.6  < > 4.4 3.4* 4.2 4.3 4.4  CL 104  < > 103 97* 98* 96* 91*  CO2 23  < > 24 28 27 31 29   GLUCOSE 111*  < > 124* 189* 113* 111* 175*  BUN 50*  < > 51* 40* 30* 31* 37*  CREATININE 1.80*  < > 1.90* 1.59* 1.62* 1.70* 1.86*  CALCIUM 8.9  < > 9.0 9.3 9.1 9.5 9.7  MG 1.7  --   --  1.6* 2.2  --   --   < > = values in this interval not displayed.  Liver Function Tests:  Recent Labs Lab 04/08/16 1600 04/10/16 0355  AST 45* 35  ALT 90* 71*  ALKPHOS 130* 136*  BILITOT 0.2* 1.1  PROT 5.7* 6.3*  ALBUMIN 2.8* 3.1*   No results for input(s): LIPASE, AMYLASE in the last 168 hours. No results for input(s): AMMONIA in the last 168 hours.  CBC:  Recent Labs Lab 04/08/16 1330 04/10/16 0355 04/11/16 0532 04/12/16 0500 04/13/16 0400  WBC  --  9.1 6.9 7.8 8.2  HGB 11.6* 10.2* 10.3* 10.8* 11.2*  HCT 34.0* 33.4* 33.8* 36.3 36.8  MCV  --  81.7 81.6 83.4 82.0  PLT  --  266 264 262 266    Cardiac Enzymes: No results for input(s): CKTOTAL, CKMB, CKMBINDEX, TROPONINI in the last 168 hours.  BNP: BNP (last 3 results)  Recent Labs  04/08/16 1834  BNP 601.2*    ProBNP (last 3 results) No results for input(s): PROBNP in the last 8760  hours.    Other results:  Imaging: No results found.   Medications:     Scheduled Medications: . amiodarone  200 mg Oral BID  . apixaban  5 mg Oral BID  . digoxin  0.125 mg Oral Daily  . furosemide  80 mg Intravenous BID  . insulin aspart  0-20 Units Subcutaneous TID WC  . metolazone  2.5 mg Oral Once  . metoprolol succinate  25 mg Oral Once  . metoprolol succinate  75 mg Oral BID  . potassium chloride  40 mEq Oral BID  . sodium chloride flush  10-40 mL Intracatheter Q12H  . sodium chloride flush  3 mL Intravenous Q12H  . spironolactone  12.5 mg Oral Daily    Infusions:   PRN Medications: sodium chloride, acetaminophen, ondansetron (ZOFRAN) IV, sodium chloride flush, sodium chloride flush   Assessment/Plan   Erin Jarvis is a 51 year old admitted with A/C systolic heart failure, A fib/flutter, and new LAA clot noted on TEE. She has marked volume overload and hypotension. Admitted to ICU due to hypotension.   1. Acute on chronic systolic CHF: EF 99991111 on TEE. Cardiomyopathy known since 54. Possibly peri-partum. Cath around 2001 in [place] reportedly showed no coronary disease. SPEP negative.  Has Medtronic ICD. She was admitted with volume overload/soft BP. She has felt progressively worse over the last few weeks. Low output by co-ox initially.  She was started on milrinone and diuresed very well, weight down. Milrinone stopped 1/11  RHC 1/12 showed ongoing elevated filling pressures, R>L.  Cardiac output was preserved.  No coronary disease. Lasix IV and metolazone restarted. CVP 12, co-ox 69% today.  - She can stay off milrinone.  Continue digoxin, check level in am.  - Rate control remains difficult, increase Toprol XL to 75 mg bid.   - Hold home ACEI with elevated creatinine.  - Continue spironolactone 12.5 daily. - Lasix 80 mg IV bid today again with a dose of metolazone, with rise in creatinine will likely convert to torsemide tomorrow.  2. Atrial  flutter/fibrillation:  Just started warfarin about 3 weeks ago, does have LA appendage thrombus. Rate control difficult, improving with Toprol XL and digoxin.   - Cannot cardiovert at this point with thrombus.  - Stop amiodarone gtt, start amiodarone 200 mg bid.    - Increase Toprol XL to 75 bid.   - Digoxin for rate control. - Transitioned to Eliquis.  -  Repeat TEE in about a month to see if thrombus has resolved with prolonged anticoagulation.  3. AKI: Creatinine mildly higher today, will likely have to transition diuretics to po tomorrow.   4. H/o VT: Has ICD. 5. Type II DM: SSI.   Loralie Champagne 04/13/2016 9:16 AM

## 2016-04-14 LAB — BASIC METABOLIC PANEL
Anion gap: 13 (ref 5–15)
BUN: 45 mg/dL — AB (ref 6–20)
CALCIUM: 10.1 mg/dL (ref 8.9–10.3)
CO2: 29 mmol/L (ref 22–32)
CREATININE: 2.13 mg/dL — AB (ref 0.44–1.00)
Chloride: 93 mmol/L — ABNORMAL LOW (ref 101–111)
GFR calc non Af Amer: 25 mL/min — ABNORMAL LOW (ref >60)
GFR, EST AFRICAN AMERICAN: 29 mL/min — AB (ref >60)
GLUCOSE: 118 mg/dL — AB (ref 65–99)
Potassium: 4.5 mmol/L (ref 3.5–5.1)
Sodium: 135 mmol/L (ref 135–145)

## 2016-04-14 LAB — COOXEMETRY PANEL
Carboxyhemoglobin: 1.4 % (ref 0.5–1.5)
Methemoglobin: 1 % (ref 0.0–1.5)
O2 Saturation: 69.1 %
TOTAL HEMOGLOBIN: 12.1 g/dL (ref 12.0–16.0)

## 2016-04-14 LAB — CBC
HCT: 38.4 % (ref 36.0–46.0)
Hemoglobin: 11.8 g/dL — ABNORMAL LOW (ref 12.0–15.0)
MCH: 25.1 pg — AB (ref 26.0–34.0)
MCHC: 30.7 g/dL (ref 30.0–36.0)
MCV: 81.5 fL (ref 78.0–100.0)
PLATELETS: 265 10*3/uL (ref 150–400)
RBC: 4.71 MIL/uL (ref 3.87–5.11)
RDW: 16.3 % — AB (ref 11.5–15.5)
WBC: 8.1 10*3/uL (ref 4.0–10.5)

## 2016-04-14 LAB — GLUCOSE, CAPILLARY
GLUCOSE-CAPILLARY: 117 mg/dL — AB (ref 65–99)
Glucose-Capillary: 117 mg/dL — ABNORMAL HIGH (ref 65–99)

## 2016-04-14 LAB — DIGOXIN LEVEL: Digoxin Level: 0.5 ng/mL — ABNORMAL LOW (ref 0.8–2.0)

## 2016-04-14 MED ORDER — TORSEMIDE 20 MG PO TABS: 60.0000 mg | ORAL_TABLET | Freq: Every day | ORAL | 3 refills | Status: DC

## 2016-04-14 MED ORDER — ALLOPURINOL 300 MG PO TABS
300.0000 mg | ORAL_TABLET | Freq: Every day | ORAL | Status: DC
  Administered 2016-04-14: 300 mg via ORAL
  Filled 2016-04-14: qty 1

## 2016-04-14 MED ORDER — METOPROLOL SUCCINATE ER 25 MG PO TB24: 75.0000 mg | ORAL_TABLET | Freq: Two times a day (BID) | ORAL | 6 refills | Status: DC

## 2016-04-14 MED ORDER — DIGOXIN 125 MCG PO TABS: 0.1250 mg | ORAL_TABLET | Freq: Every day | ORAL | 6 refills | Status: DC

## 2016-04-14 MED ORDER — SPIRONOLACTONE 25 MG PO TABS: 12.5000 mg | ORAL_TABLET | Freq: Every day | ORAL | 6 refills | Status: DC

## 2016-04-14 MED ORDER — POTASSIUM CHLORIDE CRYS ER 20 MEQ PO TBCR: 20.0000 meq | EXTENDED_RELEASE_TABLET | Freq: Every day | ORAL | 6 refills | Status: DC

## 2016-04-14 MED ORDER — OFF THE BEAT BOOK
Freq: Once | Status: AC
  Administered 2016-04-14: 1
  Filled 2016-04-14: qty 1

## 2016-04-14 MED ORDER — AMIODARONE HCL 200 MG PO TABS
200.0000 mg | ORAL_TABLET | Freq: Every day | ORAL | Status: DC
  Administered 2016-04-14: 200 mg via ORAL
  Filled 2016-04-14: qty 1

## 2016-04-14 MED ORDER — AMIODARONE HCL 200 MG PO TABS: 200.0000 mg | ORAL_TABLET | Freq: Every day | ORAL | 6 refills | Status: DC

## 2016-04-14 MED ORDER — AMIODARONE HCL 200 MG PO TABS: 200.0000 mg | ORAL_TABLET | Freq: Two times a day (BID) | ORAL | 6 refills | Status: DC

## 2016-04-14 MED ORDER — APIXABAN 5 MG PO TABS: 5.0000 mg | ORAL_TABLET | Freq: Two times a day (BID) | ORAL | 6 refills | Status: DC

## 2016-04-14 NOTE — Progress Notes (Signed)
Pt walking independently, doing well. Sts her HR is 120s when walking. In depth education for afib, HF, low sodium, daily wts, fluid restrictions and walking daily. Very receptive. Gave her videos and booklets and diets to read. Good reception and teach back.  [id number] Lawson, ACSM 11:50 AM 04/14/2016

## 2016-04-14 NOTE — Discharge Instructions (Signed)
Transesophageal Echocardiogram Transesophageal echocardiography (TEE) is a picture test of your heart using sound waves. The pictures taken can give very detailed pictures of your heart. This can help your doctor see if there are problems with your heart. TEE can check:  If your heart has blood clots in it.  How well your heart valves are working.  If you have an infection on the inside of your heart.  Some of the major arteries of your heart.  If your heart valve is working after a Office [employer].  Your heart before a procedure that uses a shock to your heart to get the rhythm back to normal. What happens before the procedure?  Do not eat or drink for 6 hours before the procedure or as told by your doctor.  Make plans to have someone drive you home after the procedure. Do not drive yourself home.  An IV tube will be put in your arm. What happens during the procedure?  You will be given a medicine to help you relax (sedative). It will be given through the IV tube.  A numbing medicine will be sprayed or gargled in the back of your throat to help numb it.  The tip of the probe is placed into the back of your mouth. You will be asked to swallow. This helps to pass the probe into your esophagus.  Once the tip of the probe is in the right place, your doctor can take pictures of your heart.  You may feel pressure at the back of your throat. What happens after the procedure?  You will be taken to a recovery area so the sedative can wear off.  Your throat may be sore and scratchy. This will go away slowly over time.  You will go home when you are fully awake and able to swallow liquids.  You should have someone stay with you for the next 24 hours.  Do not drive or operate machinery for the next 24 hours. This information is not intended to replace advice given to you by your health care provider. Make sure you discuss any questions you have with your health care provider. Document  Released: 01/12/2009 Document Revised: 08/23/2015 Document Reviewed: 09/16/2012 Elsevier Interactive Patient Education  2017 [place] on my medicine - ELIQUIS (apixaban)  Why was Eliquis prescribed for you? Eliquis was prescribed for you to reduce the risk of a blood clot forming that can cause a stroke if you have a medical condition called atrial fibrillation (a type of irregular heartbeat).  What do You need to know about Eliquis ? Take your Eliquis TWICE DAILY - one tablet in the morning and one tablet in the evening with or without food. If you have difficulty swallowing the tablet whole please discuss with your pharmacist how to take the medication safely.  Take Eliquis exactly as prescribed by your doctor and DO NOT stop taking Eliquis without talking to the doctor who prescribed the medication.  Stopping may increase your risk of developing a stroke.  Refill your prescription before you run out.  After discharge, you should have regular check-up appointments with your healthcare provider that is prescribing your Eliquis.  In the future your dose may need to be changed if your kidney function or weight changes by a significant amount or as you get older.  What do you do if you miss a dose? If you miss a dose, take it as soon as you remember on the same day and resume taking  twice daily.  Do not take more than one dose of ELIQUIS at the same time to make up a missed dose.  Important Safety Information A possible side effect of Eliquis is bleeding. You should call your healthcare provider right away if you experience any of the following: ? Bleeding from an injury or your nose that does not stop. ? Unusual colored urine (red or dark brown) or unusual colored stools (red or black). ? Unusual bruising for unknown reasons. ? A serious fall or if you hit your head (even if there is no bleeding).  Some medicines may interact with Eliquis and might increase your  risk of bleeding or clotting while on Eliquis. To help avoid this, consult your healthcare provider or pharmacist prior to using any new prescription or non-prescription medications, including herbals, vitamins, non-steroidal anti-inflammatory drugs (NSAIDs) and supplements.  This website has more information on Eliquis (apixaban): http://www.eliquis.com/eliquis/home

## 2016-04-14 NOTE — Care Management Note (Signed)
Case Management Note  Patient Details  Name: Erin Jarvis MRN: [id number] Date of Birth: Aug 07, 1965  Subjective/Objective:      Adm w chf              Action/Plan:lives at home   Expected Discharge Date:                  Expected Discharge Plan:  [place]  In-House Referral:     Discharge planning Services  CM Consult  Post Acute Care Choice:  Home Health Choice offered to:  Patient  DME Arranged:    DME Agency:     HH Arranged:  [facility] Agency:  [place]  Status of Service:  Completed, signed off  If discussed at Long Length of Stay Meetings, dates discussed:    Additional Comments:went over list of hhc agencies in [place] and no pref. Ref to [facility] [address] [phone] for hhrn. Faxed orders to [phone]. Lacretia Leigh, RN 04/14/2016, 10:16 AM

## 2016-04-14 NOTE — Progress Notes (Signed)
Discharge instructions, Rx's and follow up appts reviewed and provided to patient and c/g verbalized understanding. Patient left floor via wheelchair accompanied by volunteers. No c/o pain or shortness of breath at d/c.  Shelsey Rieth, Tivis Ringer, RN

## 2016-04-14 NOTE — Discharge Summary (Signed)
Advanced Heart Failure Discharge Note  Discharge Summary   Patient ID: Erin Jarvis MRN: [id number], DOB/AGE: [birth year] 51 y.o. Admit date: 04/08/2016 D/C date:     04/14/2016   Primary Discharge Diagnoses:  1. Acute on Chronic Systolic CHF: EF < [id number] by TEE 2. Aflutter/Fib 3. LA Appendage Thrombos 3. AKI 4. H/o VT 6. DM2  Hospital Course:   Erin Jarvis is a 51 y.o. female with hx of Post-partum CMP, VT s/p Medtronic ICD, AFib with recent RVR. She presented 04/08/16 for TEE/DCCV. Was noted to be dyspneic at rest with soft pressures and severe LV dysfunction noted on TEE with EF~20%. Unable to be cardioverted due to LA thrombus on TEE. Admitted for treatment and further evaluation.   PICC placed for coox and CVP. Diuresed with IV lasix 80 mg BID. Initial coox 54.8%. Started on milrinone 0.25 mcg/kg/min and weaned as tolerated. CVP 4 on 04/10/16. IV lasix and milrinone stopped.   [facility] 04/11/16 with no angiographic CAD but with ongoing elevated filling pressures, R>L. With preserved cardiac output. Started on Toprol and back on IV lasix and metolazone. Rate improved with Toprol XL and digoxin.  Amio gtt also used for rate control with RVR on several occasions this admission.  Overall pt diuresed 20.7 L and down 34 lbs from admission weight. Plan to repeat TEE in about a month to see if thrombus has resolved with prolonged anticoagulation with Eliquis.  Diuretics held on day of admission with plans to start torsemide 04/15/16.  She will be discharged home today in stable condition, though Creatinine mildly elevated. Will have [facility] check labs later this week, and will have close follow up in the office as below. Metformin held for now with AKI.   Discharge Weight Range: 281 lbs Discharge Vitals: Blood pressure 107/87, pulse 83, temperature 97.7 F (36.5 C), temperature source Oral, resp. rate 13, height 5\' 7"  (1.702 m), weight 281 lb 9.6 oz (127.7 kg), last menstrual period 05/30/2013,  SpO2 96 %.  Labs: Lab Results  Component Value Date   WBC 8.1 04/14/2016   HGB 11.8 (L) 04/14/2016   HCT 38.4 04/14/2016   MCV 81.5 04/14/2016   PLT 265 04/14/2016    Recent Labs Lab 04/10/16 0355  04/14/16 0445  NA 137  < > 135  K 3.4*  < > 4.5  CL 97*  < > 93*  CO2 28  < > 29  BUN 40*  < > 45*  CREATININE 1.59*  < > 2.13*  CALCIUM 9.3  < > 10.1  PROT 6.3*  --   --   BILITOT 1.1  --   --   ALKPHOS 136*  --   --   ALT 71*  --   --   AST 35  --   --   GLUCOSE 189*  < > 118*  < > = values in this interval not displayed. Lab Results  Component Value Date   CHOL 122 04/11/2016   HDL 36 (L) 04/11/2016   LDLCALC 67 04/11/2016   TRIG 97 04/11/2016   BNP (last 3 results)  Recent Labs  04/08/16 1834  BNP 601.2*    ProBNP (last 3 results) No results for input(s): PROBNP in the last 8760 hours.   Diagnostic Studies/Procedures   LHC/RHC 1/12: Dominance: Right   Left Main  Short, no angiographic CAD.  Left Anterior Descending  No angiographic CAD.  Ramus Intermedius  Moderate vessel, no angiographic CAD.  Left Circumflex  No angiographic  CAD.  Right Coronary Artery  No angiographic CAD.  Right Heart  Right Heart Pressures RHC Procedural Findings: Hemodynamics (mmHg) RA mean 19 RV 45/16 PA 46/20, mean 35 PCWP mean 24 LV 108/21 AO 111/75  Oxygen saturations: PA 66% AO 96%  Cardiac Output (Fick) 7.56  Cardiac Index (Fick) 3.16 PVR 1.4 WU  Cardiac Output (Thermo) 5.66 Cardiac Index (Thermo) 2.37  PVR 1.9 WU     Discharge Medications   Allergies as of 04/14/2016      Reactions   Morphine And Related Nausea Only, Other (See Comments)   Feels sick,and like she is going to pass out; can't breathe, starts sweating      Medication List    STOP taking these medications   furosemide 20 MG tablet Commonly known as:  LASIX   lisinopril 40 MG tablet Commonly known as:  PRINIVIL,ZESTRIL   metFORMIN 500 MG tablet Commonly known as:   GLUCOPHAGE   metoprolol 100 MG tablet Commonly known as:  LOPRESSOR   warfarin 10 MG tablet Commonly known as:  COUMADIN     TAKE these medications   allopurinol 300 MG tablet Commonly known as:  ZYLOPRIM Take 300 mg by mouth daily.   amiodarone 200 MG tablet Commonly known as:  PACERONE Take 1 tablet (200 mg total) by mouth daily. What changed:  when to take this   apixaban 5 MG Tabs tablet Commonly known as:  ELIQUIS Take 1 tablet (5 mg total) by mouth 2 (two) times daily.   digoxin 0.125 MG tablet Commonly known as:  LANOXIN Take 1 tablet (0.125 mg total) by mouth daily. Start taking on:  04/15/2016   metoprolol succinate 25 MG 24 hr tablet Commonly known as:  TOPROL-XL Take 3 tablets (75 mg total) by mouth 2 (two) times daily.   multivitamin tablet Take 1 tablet by mouth daily.   potassium chloride SA 20 MEQ tablet Commonly known as:  K-DUR,KLOR-CON Take 1 tablet (20 mEq total) by mouth daily.   spironolactone 25 MG tablet Commonly known as:  ALDACTONE Take 0.5 tablets (12.5 mg total) by mouth daily. Start taking on:  04/15/2016   torsemide 20 MG tablet Commonly known as:  DEMADEX Take 3 tablets (60 mg total) by mouth daily. Start taking on:  04/15/2016            Durable Medical Equipment        Start     Ordered   04/14/16 0756  Heart failure home health orders  (Heart failure home health orders / Face to face)  Once    Comments:  Heart Failure Follow-up Care:  Verify follow-up appointments per Patient Discharge Instructions. Confirm transportation arranged. Reconcile home medications with discharge medication list. Remove discontinued medications from use. Assist patient/caregiver to manage medications using pill box. Reinforce low sodium food selection Assessments: Vital signs and oxygen saturation at each visit. Assess home environment for safety concerns, caregiver support and availability of low-sodium foods. Consult Education officer, museum, PT/OT,  Dietitian, and CNA based on assessments. Perform comprehensive cardiopulmonary assessment. Notify MD for any change in condition or weight gain of 3 pounds in one day or 5 pounds in one week with symptoms. Daily Weights and Symptom Monitoring: Ensure patient has access to scales. Teach patient/caregiver to weigh daily before breakfast and after voiding using same scale and record.    Teach patient/caregiver to track weight and symptoms and when to notify Provider. Activity: Develop individualized activity plan with patient/caregiver.  Please draw BMET 04/17/16 and  send to AHF clinic as above.  Question Answer Comment  Heart Failure Follow-up Care Advanced [facility] ([facility]F) Clinic at [phone]   Obtain the following labs Basic Metabolic Panel   Lab frequency Other see comments   Fax lab results to AHF Clinic at [phone]   Diet Low Sodium Heart Healthy   Fluid restrictions: 2000 mL Fluid      04/14/16 0758   04/14/16 0751  Heart failure home health orders  (Heart failure home health orders / Face to face)  Once    Comments:  Heart Failure Follow-up Care:  Verify follow-up appointments per Patient Discharge Instructions. Confirm transportation arranged. Reconcile home medications with discharge medication list. Remove discontinued medications from use. Assist patient/caregiver to manage medications using pill box. Reinforce low sodium food selection Assessments: Vital signs and oxygen saturation at each visit. Assess home environment for safety concerns, caregiver support and availability of low-sodium foods. Consult Education officer, museum, PT/OT, Dietitian, and CNA based on assessments. Perform comprehensive cardiopulmonary assessment. Notify MD for any change in condition or weight gain of 3 pounds in one day or 5 pounds in one week with symptoms. Daily Weights and Symptom Monitoring: Ensure patient has access to scales. Teach patient/caregiver to weigh daily before breakfast and after  voiding using same scale and record.    Teach patient/caregiver to track weight and symptoms and when to notify Provider. Activity: Develop individualized activity plan with patient/caregiver.   Question Answer Comment  Heart Failure Follow-up Care Advanced [facility] ([facility]F) Clinic at [phone]   Obtain the following labs Basic Metabolic Panel   Lab frequency Weekly   Fax lab results to AHF Clinic at [phone]   Diet Low Sodium Heart Healthy   Fluid restrictions: 1800 mL Fluid   Skilled Nurse to notify MD of weight trends weekly for first 2 weeks. May fax or call: AHF Clinic at [phone] (fax) or [facility] Diuretic Protocol to be used by [place] only ( to be ordered by Heart Failure Team Providers Only) Yes      04/14/16 0752      Disposition   The patient will be discharged in stable condition to home. Discharge Instructions    (HEART FAILURE PATIENTS) Call MD:  Anytime you have any of the following symptoms: 1) 3 pound weight gain in 24 hours or 5 pounds in 1 week 2) shortness of breath, with or without a dry hacking cough 3) swelling in the hands, feet or stomach 4) if you have to sleep on extra pillows at night in order to breathe.    Complete by:  As directed    Diet - low sodium heart healthy    Complete by:  As directed    Heart Failure patients record your daily weight using the same scale at the same time of day    Complete by:  As directed    Increase activity slowly    Complete by:  As directed      Follow-up Information    Loralie Champagne, MD Follow up on 04/22/2016.   Specialty:  Cardiology Why:  at 1030 for post hospital follow up. Please bring all of your medications to your visit. The code for parking is 5000. Contact information: [place] [address] [phone]        [name], MD. Schedule an appointment as soon as possible for a visit in 1 week(s).   Specialty:  Internal Medicine Why:  For Diabetes medicine adjustment.  Metformin stopped with renal function.  Contact information: [place] [phone]        [facility] Follow up.   Why:  nurse will call 24-48hours after disch to set up home visit Contact information: [address] [place] [phone]            Duration of Discharge Encounter: Greater than 35 minutes   Signed, Shirley Friar, PA-C 04/14/2016, 3:57 PM

## 2016-04-14 NOTE — Progress Notes (Signed)
Patient ID: Erin Jarvis, female   DOB: [birth year], 51 y.o.   MRN: [id number]    Advanced Heart Failure Rounding Note   Subjective:    Admitted with marked volume overload from Endo with marked volume overload, LAA clot, and A fib with RVR. TEE EF ~20%.   She was started on milrinone with low co-ox and diuresed with good weight loss and UOP.  Milrinone stopped 1/11.  Lasix/metolazone continued yesterday.  Creatinine up at 2.1.  CVP 10 today with co-ox 69%.  HR now in 90s off IV amiodarone and on po.       Breathing improved.   LHC/RHC 1/12: Dominance: Right   Left Main  Short, no angiographic CAD.  Left Anterior Descending  No angiographic CAD.  Ramus Intermedius  Moderate vessel, no angiographic CAD.  Left Circumflex  No angiographic CAD.  Right Coronary Artery  No angiographic CAD.  Right Heart  Right Heart Pressures RHC Procedural Findings: Hemodynamics (mmHg) RA mean 19 RV 45/16 PA 46/20, mean 35 PCWP mean 24 LV 108/21 AO 111/75  Oxygen saturations: PA 66% AO 96%  Cardiac Output (Fick) 7.56  Cardiac Index (Fick) 3.16 PVR 1.4 WU  Cardiac Output (Thermo) 5.66 Cardiac Index (Thermo) 2.37  PVR 1.9 WU     Objective:   Weight Range:  Vital Signs:   Temp:  [97.5 F (36.4 C)-98.2 F (36.8 C)] 97.5 F (36.4 C) (01/15 0733) Pulse Rate:  [83-99] 83 (01/15 0545) Resp:  [10-22] 14 (01/15 0545) BP: (90-108)/(54-87) 107/87 (01/15 0545) SpO2:  [95 %-98 %] 96 % (01/15 0545) Weight:  [281 lb 9.6 oz (127.7 kg)] 281 lb 9.6 oz (127.7 kg) (01/15 0545) Last BM Date: 04/13/16  Weight change: Filed Weights   04/12/16 0500 04/13/16 0440 04/14/16 0545  Weight: 286 lb 3.2 oz (129.8 kg) 281 lb 4.9 oz (127.6 kg) 281 lb 9.6 oz (127.7 kg)    Intake/Output:   Intake/Output Summary (Last 24 hours) at 04/14/16 0743 Last data filed at 04/14/16 0006  Gross per 24 hour  Intake                0 ml  Output             2440 ml  Net            -2440 ml    Physical  Exam: General:  NAD  HEENT: normal Neck: Thick, supple. JVP 8. Carotids 2+ bilat; no bruits. No lymphadenopathy or thyromegaly appreciated. Cor: PMI nondisplaced. Irregular rate & rhythm. No rubs, gallops or murmurs. Lungs: clear Abdomen: soft, nontender, nondistended. No hepatosplenomegaly. No bruits or masses. Good bowel sounds. Extremities: no cyanosis, clubbing, rash.  No edema Neuro: alert & oriented x 3, cranial nerves grossly intact. moves all 4 extremities w/o difficulty. Affect pleasant.  Telemetry:  Atrial fibrillation 90s  Labs: Basic Metabolic Panel:  Recent Labs Lab 04/08/16 1600  04/10/16 0355 04/11/16 0532 04/12/16 0500 04/13/16 0400 04/14/16 0445  NA 136  < > 137 138 137 133* 135  K 4.6  < > 3.4* 4.2 4.3 4.4 4.5  CL 104  < > 97* 98* 96* 91* 93*  CO2 23  < > 28 27 31 29 29   GLUCOSE 111*  < > 189* 113* 111* 175* 118*  BUN 50*  < > 40* 30* 31* 37* 45*  CREATININE 1.80*  < > 1.59* 1.62* 1.70* 1.86* 2.13*  CALCIUM 8.9  < > 9.3 9.1 9.5 9.7 10.1  MG 1.7  --  1.6* 2.2  --   --   --   < > = values in this interval not displayed.  Liver Function Tests:  Recent Labs Lab 04/08/16 1600 04/10/16 0355  AST 45* 35  ALT 90* 71*  ALKPHOS 130* 136*  BILITOT 0.2* 1.1  PROT 5.7* 6.3*  ALBUMIN 2.8* 3.1*   No results for input(s): LIPASE, AMYLASE in the last 168 hours. No results for input(s): AMMONIA in the last 168 hours.  CBC:  Recent Labs Lab 04/10/16 0355 04/11/16 0532 04/12/16 0500 04/13/16 0400 04/14/16 0445  WBC 9.1 6.9 7.8 8.2 8.1  HGB 10.2* 10.3* 10.8* 11.2* 11.8*  HCT 33.4* 33.8* 36.3 36.8 38.4  MCV 81.7 81.6 83.4 82.0 81.5  PLT 266 264 262 266 265    Cardiac Enzymes: No results for input(s): CKTOTAL, CKMB, CKMBINDEX, TROPONINI in the last 168 hours.  BNP: BNP (last 3 results)  Recent Labs  04/08/16 1834  BNP 601.2*    ProBNP (last 3 results) No results for input(s): PROBNP in the last 8760 hours.    Other results:  Imaging: No  results found.   Medications:     Scheduled Medications: . amiodarone  200 mg Oral BID  . apixaban  5 mg Oral BID  . digoxin  0.125 mg Oral Daily  . insulin aspart  0-20 Units Subcutaneous TID WC  . metoprolol succinate  75 mg Oral BID  . potassium chloride  40 mEq Oral BID  . sodium chloride flush  10-40 mL Intracatheter Q12H  . sodium chloride flush  3 mL Intravenous Q12H  . spironolactone  12.5 mg Oral Daily    Infusions:   PRN Medications: sodium chloride, acetaminophen, ondansetron (ZOFRAN) IV, sodium chloride flush, sodium chloride flush   Assessment/Plan   Ms Clerkin is a 51 year old admitted with A/C systolic heart failure, A fib/flutter, and new LAA clot noted on TEE. She has marked volume overload and hypotension. Admitted to ICU due to hypotension.   1. Acute on chronic systolic CHF: EF 99991111 on TEE. Cardiomyopathy known since 51. Possibly peri-partum. Cath around 2001 in [place] reportedly showed no coronary disease. SPEP negative.  Has Medtronic ICD. She was admitted with volume overload/soft BP. She has felt progressively worse over the last few weeks. Low output by co-ox initially.  She was started on milrinone and diuresed very well, weight down. Milrinone stopped 1/11  RHC 1/12 showed ongoing elevated filling pressures, R>L.  Cardiac output was preserved.  No coronary disease. She has been diuresed with Lasix IV and metolazone, weight down > 20 lbs but creatinine now up to 2.1. CVP 10, co-ox 69% today.  - She can stay off milrinone.  Continue digoxin, level ok today at 0.5.   - Continue Toprol XL 75 mg bid.    - Hold home ACEI with elevated creatinine.  - Continue spironolactone 12.5 daily. - Hold diuretics today, start torsemide 60 mg daily tomorrow.  2. Atrial flutter/fibrillation:  Just started warfarin about 3 weeks ago, does have LA appendage thrombus. Rate control difficult, improving with Toprol XL and digoxin.   - Cannot cardiovert at this  point with thrombus.  - Can send home on amiodarone 200 mg daily.    - Continue Toprol XL 75 bid.   - Digoxin for rate control. - Transitioned to Eliquis.  - Repeat TEE in about a month to see if thrombus has resolved with prolonged anticoagulation.  3. AKI: Creatinine higher today, will hold diuretics for a day.  4. H/o VT: Has ICD. 5. Type II DM: SSI.  6. Disposition: I think she can go home today.  Needs followup with me next week.  Needs home health, have them draw BMET later this week.  Meds for home: Eliquis 5 mg bid, Toprol XL 75 mg bid, torsemide 60 mg daily, spironolactone 12.5 mg daily, amiodarone 200 mg daily, allopurinol 300 daily, digoxin 0.125 daily.    Loralie Champagne 04/14/2016 7:43 AM

## 2016-04-15 ENCOUNTER — Telehealth: Payer: Self-pay

## 2016-04-15 ENCOUNTER — Ambulatory Visit (INDEPENDENT_AMBULATORY_CARE_PROVIDER_SITE_OTHER): Payer: Medicare Other

## 2016-04-15 DIAGNOSIS — I5022 Chronic systolic (congestive) heart failure: Secondary | ICD-10-CM | POA: Diagnosis not present

## 2016-04-15 DIAGNOSIS — I509 Heart failure, unspecified: Secondary | ICD-10-CM

## 2016-04-15 DIAGNOSIS — Z7901 Long term (current) use of anticoagulants: Secondary | ICD-10-CM | POA: Diagnosis not present

## 2016-04-15 DIAGNOSIS — Z95810 Presence of automatic (implantable) cardiac defibrillator: Secondary | ICD-10-CM | POA: Diagnosis not present

## 2016-04-15 DIAGNOSIS — I110 Hypertensive heart disease with heart failure: Secondary | ICD-10-CM | POA: Diagnosis not present

## 2016-04-15 DIAGNOSIS — I4891 Unspecified atrial fibrillation: Secondary | ICD-10-CM | POA: Diagnosis not present

## 2016-04-15 DIAGNOSIS — Z98810 Dental sealant status: Secondary | ICD-10-CM | POA: Diagnosis not present

## 2016-04-15 DIAGNOSIS — E119 Type 2 diabetes mellitus without complications: Secondary | ICD-10-CM | POA: Diagnosis not present

## 2016-04-15 DIAGNOSIS — G4733 Obstructive sleep apnea (adult) (pediatric): Secondary | ICD-10-CM | POA: Diagnosis not present

## 2016-04-15 MED FILL — Amiodarone HCl Inj 150 MG/3ML (50 MG/ML): INTRAVENOUS | Qty: 3 | Status: AC

## 2016-04-15 NOTE — Telephone Encounter (Signed)
Remote ICM transmission received.  Attempted patient call and left detailed message regarding transmission and next ICM scheduled for 04/29/2016.  Advised to return call for any fluid symptoms or questions.

## 2016-04-15 NOTE — Progress Notes (Signed)
EPIC Encounter for ICM Monitoring  Patient Name: Erin Jarvis is a 51 y.o. female Date: 04/15/2016 Primary Care Physican: [name], MD Primary Cardiologist:Hochrein Electrophysiologist: Allred Dry Weight:Unknown      Attempted ICM call and unable to reach.  Left detailed message regarding transmission.  Transmission reviewed.   Thoracic impedance abnormal suggesting dryness which correlates with being diuresed at hospitalization. Per note, overall pt diuresed 20.7 L and down 34 lbs from admission weight.  Discharged on 04/08/2016  Recommendations:  Provided ICM number and encouraged to call for fluid symptoms.  Follow-up plan: ICM clinic phone appointment on 04/29/2016 and HF clinic office appointment on 04/22/2016.  Copy of ICM check sent to primary cardiologist and device physician.   3 month ICM trend: 04/15/2016     1 Year ICM trend:      [name], RN 04/15/2016 9:00 AM

## 2016-04-16 ENCOUNTER — Ambulatory Visit: Payer: Medicare Other | Admitting: Cardiology

## 2016-04-18 DIAGNOSIS — I509 Heart failure, unspecified: Secondary | ICD-10-CM | POA: Diagnosis not present

## 2016-04-18 DIAGNOSIS — Z7901 Long term (current) use of anticoagulants: Secondary | ICD-10-CM | POA: Diagnosis not present

## 2016-04-18 DIAGNOSIS — G4733 Obstructive sleep apnea (adult) (pediatric): Secondary | ICD-10-CM | POA: Diagnosis not present

## 2016-04-18 DIAGNOSIS — I4891 Unspecified atrial fibrillation: Secondary | ICD-10-CM | POA: Diagnosis not present

## 2016-04-18 DIAGNOSIS — I110 Hypertensive heart disease with heart failure: Secondary | ICD-10-CM | POA: Diagnosis not present

## 2016-04-18 DIAGNOSIS — E119 Type 2 diabetes mellitus without complications: Secondary | ICD-10-CM | POA: Diagnosis not present

## 2016-04-18 DIAGNOSIS — I5022 Chronic systolic (congestive) heart failure: Secondary | ICD-10-CM | POA: Diagnosis not present

## 2016-04-21 DIAGNOSIS — I110 Hypertensive heart disease with heart failure: Secondary | ICD-10-CM | POA: Diagnosis not present

## 2016-04-21 DIAGNOSIS — I5022 Chronic systolic (congestive) heart failure: Secondary | ICD-10-CM | POA: Diagnosis not present

## 2016-04-21 DIAGNOSIS — I4891 Unspecified atrial fibrillation: Secondary | ICD-10-CM | POA: Diagnosis not present

## 2016-04-21 DIAGNOSIS — G4733 Obstructive sleep apnea (adult) (pediatric): Secondary | ICD-10-CM | POA: Diagnosis not present

## 2016-04-21 DIAGNOSIS — Z7901 Long term (current) use of anticoagulants: Secondary | ICD-10-CM | POA: Diagnosis not present

## 2016-04-21 DIAGNOSIS — E119 Type 2 diabetes mellitus without complications: Secondary | ICD-10-CM | POA: Diagnosis not present

## 2016-04-22 ENCOUNTER — Ambulatory Visit (HOSPITAL_COMMUNITY)
Admission: RE | Admit: 2016-04-22 | Discharge: 2016-04-22 | Disposition: A | Discharge status: Home / Self Care | Payer: Medicare Other | Source: Ambulatory Visit | Attending: Cardiology | Admitting: Cardiology

## 2016-04-22 ENCOUNTER — Telehealth (HOSPITAL_COMMUNITY): Payer: Self-pay | Admitting: *Deleted

## 2016-04-22 VITALS — BP 104/56 | HR 110 | Wt 277.5 lb

## 2016-04-22 DIAGNOSIS — I481 Persistent atrial fibrillation: Secondary | ICD-10-CM | POA: Diagnosis not present

## 2016-04-22 DIAGNOSIS — N183 Chronic kidney disease, stage 3 (moderate): Secondary | ICD-10-CM | POA: Insufficient documentation

## 2016-04-22 DIAGNOSIS — I5022 Chronic systolic (congestive) heart failure: Secondary | ICD-10-CM

## 2016-04-22 DIAGNOSIS — I130 Hypertensive heart and chronic kidney disease with heart failure and stage 1 through stage 4 chronic kidney disease, or unspecified chronic kidney disease: Secondary | ICD-10-CM | POA: Insufficient documentation

## 2016-04-22 DIAGNOSIS — O903 Peripartum cardiomyopathy: Secondary | ICD-10-CM

## 2016-04-22 DIAGNOSIS — I4819 Other persistent atrial fibrillation: Secondary | ICD-10-CM

## 2016-04-22 DIAGNOSIS — I4891 Unspecified atrial fibrillation: Secondary | ICD-10-CM | POA: Diagnosis not present

## 2016-04-22 DIAGNOSIS — E1122 Type 2 diabetes mellitus with diabetic chronic kidney disease: Secondary | ICD-10-CM | POA: Diagnosis not present

## 2016-04-22 DIAGNOSIS — Z95810 Presence of automatic (implantable) cardiac defibrillator: Secondary | ICD-10-CM | POA: Diagnosis not present

## 2016-04-22 DIAGNOSIS — I428 Other cardiomyopathies: Secondary | ICD-10-CM | POA: Insufficient documentation

## 2016-04-22 DIAGNOSIS — Z7901 Long term (current) use of anticoagulants: Secondary | ICD-10-CM | POA: Insufficient documentation

## 2016-04-22 DIAGNOSIS — Z79899 Other long term (current) drug therapy: Secondary | ICD-10-CM | POA: Insufficient documentation

## 2016-04-22 LAB — COMPREHENSIVE METABOLIC PANEL
ALT: 34 U/L (ref 14–54)
AST: 53 U/L — AB (ref 15–41)
Albumin: 4.5 g/dL (ref 3.5–5.0)
Alkaline Phosphatase: 91 U/L (ref 38–126)
Anion gap: 15 (ref 5–15)
BILIRUBIN TOTAL: 0.9 mg/dL (ref 0.3–1.2)
BUN: 81 mg/dL — AB (ref 6–20)
CHLORIDE: 100 mmol/L — AB (ref 101–111)
CO2: 21 mmol/L — ABNORMAL LOW (ref 22–32)
CREATININE: 2.22 mg/dL — AB (ref 0.44–1.00)
Calcium: 10.2 mg/dL (ref 8.9–10.3)
GFR, EST AFRICAN AMERICAN: 28 mL/min — AB (ref >60)
GFR, EST NON AFRICAN AMERICAN: 24 mL/min — AB (ref >60)
Glucose, Bld: 161 mg/dL — ABNORMAL HIGH (ref 65–99)
POTASSIUM: 4.4 mmol/L (ref 3.5–5.1)
Sodium: 136 mmol/L (ref 135–145)
TOTAL PROTEIN: 8.1 g/dL (ref 6.5–8.1)

## 2016-04-22 LAB — DIGOXIN LEVEL: DIGOXIN LVL: 1.7 ng/mL (ref 0.8–2.0)

## 2016-04-22 LAB — TSH: TSH: 6.199 u[IU]/mL — ABNORMAL HIGH (ref 0.350–4.500)

## 2016-04-22 LAB — BRAIN NATRIURETIC PEPTIDE: B NATRIURETIC PEPTIDE 5: 205.9 pg/mL — AB (ref 0.0–100.0)

## 2016-04-22 MED ORDER — SPIRONOLACTONE 25 MG PO TABS: 25.0000 mg | ORAL_TABLET | Freq: Every day | ORAL | 6 refills | Status: DC

## 2016-04-22 MED ORDER — TORSEMIDE 20 MG PO TABS: 40.0000 mg | ORAL_TABLET | Freq: Every day | ORAL | 3 refills | Status: DC

## 2016-04-22 NOTE — Addendum Note (Signed)
Encounter addended by: Larey Dresser, MD on: 04/22/2016  5:04 PM<BR>    Actions taken: LOS modified

## 2016-04-22 NOTE — Patient Instructions (Signed)
Increase Spironolactone to 25 mg daily  Stop Potassium  Labs today  Labs in 10 days  Your physician recommends that you schedule a follow-up appointment in: 3 weeks

## 2016-04-22 NOTE — Telephone Encounter (Signed)
Notes Recorded by Scarlette Calico, RN on 04/22/2016 at 4:54 PM EST Pt aware and agreeable, she is sch for labs 2/2 ------  Notes Recorded by Larey Dresser, MD on 04/22/2016 at 3:48 PM EST Hold torsemide x 1 day, then decrease to 40 mg daily. BMET in 1 week.

## 2016-04-22 NOTE — Progress Notes (Signed)
PCP: Dr. Sherrie Sport Cardiology: Dr. Percival Spanish HF Cardiology: Dr. 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 At one point EF improved to 45% from 20%.  She also has a history VT with Medtronic ICD placed in [place]. In 12/17, she was  admitted to [facility] with new onset atrial fibrillation/RVR and volume overload. Required IV diuresis and cardizem drip.  She remained in atrial fibrillation.  Warfarin was started that admission.   On 03/26/16, she saw Dr Percival Spanish and she was set up TEE/DCCV. Creatinine at that time was 2.03.  She had had a functional decline and dyspnea at rest. Weight at home had been trending up from 290 to 309 pounds.  SBP in 90s at Dr. Rosezella [place] office.   On 04/08/16, she presented for scheduled TEE/DC-CV, however thrombus was noted in LA appendage so DC-CV was not pursued. TEE also showed severe LV dysfunction. Dyspneic at rest. SBP soft.  She was admitted and ultimately required initiation of milrinone gtt + diuresis with IV Lasix and metolazone.  She lost > 20 lbs.  We were able to titrate her off milrinone and she was sent home on torsemide.  Rate was difficult to control, Toprol XL was titrated up to 75 mg bid.  She returns for followup.  She is feeling "100% better" compared to prior to hospitalization.  No dyspnea walking on flat ground.  No orthopnea/PND.  Mild dyspnea walking up stairs/inclines.  No lightheadedness.  No chest pain.  No BRBPR or melena.  HR 80s-90s at home when she checks (daily).  Weight down 4 lbs since she left the hospital.  She remains in atrial fibrillation.   Labs (1/18): digoxin 0.5, K 4.5, creatinine 2.13  PMH: 1. Chronic systolic CHF: Nonischemic cardiomyopathy.  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  EF initially 20%, rose to 45% at one point.   - h/o VT => Medtronic ICD.  - Echo (1/18): EF 25-30%, mildly dilated RV with normal  systolic function, moderate TR, PASP 31 mmHg.  - LHC/RHC (1/18): No CAD; mean RA 19, PA 46/20 mean 35, mean PCWP 24, CI 2.37 (Thermo), CI 3.16 (Fick), PVR 1.9 WU.  2. Atrial fibrillation: Persistent.  Diagnosed 12/17 when she presented to [facility] with afib/RVR.  TEE/DCCV set up in 1/18 but no DCCV due to LA thrombus on TEE.   3. Gout 4. Type II diabetes 5. HTN 6. OSA: Uses CPAP 7. CKD stage III  SH: Divorced, on disability, lives with parents in [place], nonsmoker, no ETOH, no drugs.   Family History  Problem Relation Age of Onset  . Other Mother     has chronic pain syndrome from DDD with spinal cord stimulator  . Hypertension Mother   . COPD Father   . Arthritis/Rheumatoid Father    ROS: All systems reviewed and negative except as per HPI.   Current Outpatient Prescriptions  Medication Sig Dispense Refill  . allopurinol (ZYLOPRIM) 300 MG tablet Take 300 mg by mouth daily.     Marland Kitchen amiodarone (PACERONE) 200 MG tablet Take 1 tablet (200 mg total) by mouth daily. 30 tablet 6  . apixaban (ELIQUIS) 5 MG TABS tablet Take 1 tablet (5 mg total) by mouth 2 (two) times daily. 60 tablet 6  . digoxin (LANOXIN) 0.125 MG tablet Take 1 tablet (0.125 mg total) by mouth daily. 30 tablet 6  . metoprolol succinate (TOPROL-XL) 25 MG 24 hr tablet  Take 3 tablets (75 mg total) by mouth 2 (two) times daily. 180 tablet 6  . Multiple Vitamin (MULTIVITAMIN) tablet Take 1 tablet by mouth daily.    Marland Kitchen spironolactone (ALDACTONE) 25 MG tablet Take 1 tablet (25 mg total) by mouth daily. 30 tablet 6  . torsemide (DEMADEX) 20 MG tablet Take 3 tablets (60 mg total) by mouth daily. 180 tablet 3   No current facility-administered medications for this encounter.    BP (!) 104/56 (BP Location: Right Arm, Patient Position: Sitting, Cuff Size: Large)   Pulse (!) 110   Wt 277 lb 8 oz (125.9 kg)   LMP 03/14/2014 Comment: neg preg test  SpO2 99%   BMI 43.46 kg/m  General: NAD, obese Neck: No JVD, no thyromegaly or  thyroid nodule.  Lungs: Clear to auscultation bilaterally with normal respiratory effort. CV: Nondisplaced PMI.  Heart mildly tachy, irregular S1/S2, no S3/S4, no murmur.  Trace ankle edema.  No carotid bruit.  Normal pedal pulses.  Abdomen: Soft, nontender, no hepatosplenomegaly, no distention.  Skin: Intact without lesions or rashes.  Neurologic: Alert and oriented x 3.  Psych: Normal affect. Extremities: No clubbing or cyanosis.  HEENT: Normal.   Assessment/Plan: 1. Chronic systolic CHF: Echo 123456 with EF 25-30%, nonischemic cardiomyopathy. Cardiomyopathy known since 108. Possibly peri-partum. SPEP negative.  Has Medtronic ICD. LHC (1/18) with no coronary disease.  She was admitted in 1/18 with extensive diuresis, required milrinone due to low output but was able to titrate off.  When RHC was done, cardiac output was ok off milrinone. NYHA class II symptoms now, she is no longer volume overloaded on exam.  - Continue torsemide 60 mg daily but need to check BMET today (may need to back off on torsemide if creatinine up).    - Continue Toprol XL 75 mg bid.    - Can increase spironolactone to 25 mg daily and stop KCl.  BMET in 10 days.  - Continue digoxin, check level today.  - No ACEI/ARB/ARNI yet with elevated creatinine.  - Ultimately, think atrial fibrillation sent her into overt CHF.  She will need eventual restoration of NSR.   2. Atrial flutter/fibrillation:  Noted in 12/17.  TEE in 1/18 showed LA appendage thrombus. She is still in atrial fibrillation, rate high today but has been ok at home.  She is now on Eliquis. She decompensated from CHF standpoint after going into atrial fibrillation, think we need to get her back in NSR.  - Repeat TEE around 05/15/16 (1 more month of anticoagulation).  If no thrombus, will cardiovert.  She will continue Eliquis 5 mg bid.   - Continue amiodarone 200 mg daily, hopefully will hold her in NSR after DCCV.  Check LFTs/TSH.  She will need regular eye  exam while on amiodarone.    - Continue Toprol XL 75 bid.   - Digoxin for rate control, check digoxin level today.  3. CKD: Stage III.  BMET today, may need to cut back torsemide.   4. H/o VT: Has ICD.  Followup in 3 wks.   Loralie Champagne 04/22/2016 4:57 PM

## 2016-04-23 DIAGNOSIS — I110 Hypertensive heart disease with heart failure: Secondary | ICD-10-CM | POA: Diagnosis not present

## 2016-04-23 DIAGNOSIS — G4733 Obstructive sleep apnea (adult) (pediatric): Secondary | ICD-10-CM | POA: Diagnosis not present

## 2016-04-23 DIAGNOSIS — I5022 Chronic systolic (congestive) heart failure: Secondary | ICD-10-CM | POA: Diagnosis not present

## 2016-04-23 DIAGNOSIS — I4891 Unspecified atrial fibrillation: Secondary | ICD-10-CM | POA: Diagnosis not present

## 2016-04-23 DIAGNOSIS — Z7901 Long term (current) use of anticoagulants: Secondary | ICD-10-CM | POA: Diagnosis not present

## 2016-04-23 DIAGNOSIS — E119 Type 2 diabetes mellitus without complications: Secondary | ICD-10-CM | POA: Diagnosis not present

## 2016-04-24 ENCOUNTER — Telehealth (HOSPITAL_COMMUNITY): Payer: Self-pay | Admitting: *Deleted

## 2016-04-24 NOTE — Telephone Encounter (Signed)
Patient called in saying she has been having diarrhea for 3 days from the flu and wanted to see if she needed to hold any fluid pills to prevent from becoming dehydrated.  I spoke with Any Tillery, PA and as long has she is not gaining weight she can hold her Torsemide for 2 days while she is having diarrhea.    I called patient back and verified that her weight is stable and that she can hold torsemide for 2 days while she is having diarrhea.  She agrees and no further questions at this time.

## 2016-04-25 DIAGNOSIS — I110 Hypertensive heart disease with heart failure: Secondary | ICD-10-CM | POA: Diagnosis not present

## 2016-04-25 DIAGNOSIS — I5022 Chronic systolic (congestive) heart failure: Secondary | ICD-10-CM | POA: Diagnosis not present

## 2016-04-25 DIAGNOSIS — Z7901 Long term (current) use of anticoagulants: Secondary | ICD-10-CM | POA: Diagnosis not present

## 2016-04-25 DIAGNOSIS — I4891 Unspecified atrial fibrillation: Secondary | ICD-10-CM | POA: Diagnosis not present

## 2016-04-25 DIAGNOSIS — E119 Type 2 diabetes mellitus without complications: Secondary | ICD-10-CM | POA: Diagnosis not present

## 2016-04-25 DIAGNOSIS — G4733 Obstructive sleep apnea (adult) (pediatric): Secondary | ICD-10-CM | POA: Diagnosis not present

## 2016-04-29 ENCOUNTER — Ambulatory Visit (INDEPENDENT_AMBULATORY_CARE_PROVIDER_SITE_OTHER): Payer: Medicare Other

## 2016-04-29 ENCOUNTER — Telehealth: Payer: Self-pay

## 2016-04-29 DIAGNOSIS — Z7901 Long term (current) use of anticoagulants: Secondary | ICD-10-CM | POA: Diagnosis not present

## 2016-04-29 DIAGNOSIS — I5022 Chronic systolic (congestive) heart failure: Secondary | ICD-10-CM

## 2016-04-29 DIAGNOSIS — G4733 Obstructive sleep apnea (adult) (pediatric): Secondary | ICD-10-CM | POA: Diagnosis not present

## 2016-04-29 DIAGNOSIS — I4891 Unspecified atrial fibrillation: Secondary | ICD-10-CM | POA: Diagnosis not present

## 2016-04-29 DIAGNOSIS — Z95810 Presence of automatic (implantable) cardiac defibrillator: Secondary | ICD-10-CM

## 2016-04-29 DIAGNOSIS — I110 Hypertensive heart disease with heart failure: Secondary | ICD-10-CM | POA: Diagnosis not present

## 2016-04-29 DIAGNOSIS — E119 Type 2 diabetes mellitus without complications: Secondary | ICD-10-CM | POA: Diagnosis not present

## 2016-04-29 NOTE — Telephone Encounter (Signed)
Remote ICM transmission received.  Attempted patient call and left message to return call regarding transmission.    

## 2016-04-29 NOTE — Progress Notes (Signed)
EPIC Encounter for ICM Monitoring  Patient Name: Erin Jarvis is a 51 y.o. female Date: 04/29/2016 Primary Care Physican: [name], MD Primary Cardiologist:Hochrein/McLean Electrophysiologist: Allred Dry Weight:Unknown          Attempted call to patient and unable to reach.  Left message to return call regarding transmission.  Transmission reviewed.   Thoracic impedance abnormal suggesting dryness.  Per telephone note 04/24/2016, patient was experiencing diarrhea and instructed to hold Torsemide x 2 days.  Labs: 04/22/2016 Creatinine 2.22, BUN 81, Potassium 4.4, Sodium 136, EGFR 24-28 04/14/2016 Creatinine 2.13, BUN 45, Potassium 4.5, Sodium 135, EGFR 25-29  04/13/2016 Creatinine 1.86, BUN 37, Potassium 4.4, Sodium 133, EGFR 30-35  04/12/2016 Creatinine 1.70, BUN 31, Potassium 4.3, Sodium 137, EGFR 33-39  04/11/2016 Creatinine 1.62, BUN 30, Potassium 4.2, Sodium 138, EGFR 35-41  04/10/2016 Creatinine 1.59, BUN 40, Potassium 3.4, Sodium 137, EGFR 36-42  04/09/2016 Creatinine 1.90, BUN 51, Potassium 4.4, Sodium 137, EGFR 29-34  04/08/2016 Creatinine 1.75, BUN 50, Potassium 4.2, Sodium 137, EGFR 32-37   Recommendations: NONE - Unable to reach patient.  Follow-up plan: ICM clinic phone appointment on 05/06/2016 to recheck fluid levels.  HF clinic office appointment 05/15/2016  Copy of ICM check sent to primary cardiologist and device physician.   3 month ICM trend: 04/29/2016   1 Year ICM trend:      [name], RN 04/29/2016 2:23 PM

## 2016-04-30 ENCOUNTER — Other Ambulatory Visit (HOSPITAL_COMMUNITY): Payer: Self-pay | Admitting: Cardiology

## 2016-05-01 DIAGNOSIS — E119 Type 2 diabetes mellitus without complications: Secondary | ICD-10-CM | POA: Diagnosis not present

## 2016-05-01 DIAGNOSIS — I5022 Chronic systolic (congestive) heart failure: Secondary | ICD-10-CM | POA: Diagnosis not present

## 2016-05-01 DIAGNOSIS — Z7901 Long term (current) use of anticoagulants: Secondary | ICD-10-CM | POA: Diagnosis not present

## 2016-05-01 DIAGNOSIS — I110 Hypertensive heart disease with heart failure: Secondary | ICD-10-CM | POA: Diagnosis not present

## 2016-05-01 DIAGNOSIS — I4891 Unspecified atrial fibrillation: Secondary | ICD-10-CM | POA: Diagnosis not present

## 2016-05-01 DIAGNOSIS — G4733 Obstructive sleep apnea (adult) (pediatric): Secondary | ICD-10-CM | POA: Diagnosis not present

## 2016-05-02 ENCOUNTER — Other Ambulatory Visit (HOSPITAL_COMMUNITY): Payer: Medicare Other

## 2016-05-02 DIAGNOSIS — E119 Type 2 diabetes mellitus without complications: Secondary | ICD-10-CM | POA: Diagnosis not present

## 2016-05-02 DIAGNOSIS — G4733 Obstructive sleep apnea (adult) (pediatric): Secondary | ICD-10-CM | POA: Diagnosis not present

## 2016-05-02 DIAGNOSIS — Z7901 Long term (current) use of anticoagulants: Secondary | ICD-10-CM | POA: Diagnosis not present

## 2016-05-02 DIAGNOSIS — I4891 Unspecified atrial fibrillation: Secondary | ICD-10-CM | POA: Diagnosis not present

## 2016-05-02 DIAGNOSIS — I110 Hypertensive heart disease with heart failure: Secondary | ICD-10-CM | POA: Diagnosis not present

## 2016-05-02 DIAGNOSIS — I5022 Chronic systolic (congestive) heart failure: Secondary | ICD-10-CM | POA: Diagnosis not present

## 2016-05-02 DIAGNOSIS — I509 Heart failure, unspecified: Secondary | ICD-10-CM | POA: Diagnosis not present

## 2016-05-06 ENCOUNTER — Ambulatory Visit (INDEPENDENT_AMBULATORY_CARE_PROVIDER_SITE_OTHER): Payer: Medicare Other

## 2016-05-06 ENCOUNTER — Telehealth: Payer: Self-pay

## 2016-05-06 DIAGNOSIS — E119 Type 2 diabetes mellitus without complications: Secondary | ICD-10-CM | POA: Diagnosis not present

## 2016-05-06 DIAGNOSIS — I4891 Unspecified atrial fibrillation: Secondary | ICD-10-CM | POA: Diagnosis not present

## 2016-05-06 DIAGNOSIS — I110 Hypertensive heart disease with heart failure: Secondary | ICD-10-CM | POA: Diagnosis not present

## 2016-05-06 DIAGNOSIS — G4733 Obstructive sleep apnea (adult) (pediatric): Secondary | ICD-10-CM | POA: Diagnosis not present

## 2016-05-06 DIAGNOSIS — Z95810 Presence of automatic (implantable) cardiac defibrillator: Secondary | ICD-10-CM

## 2016-05-06 DIAGNOSIS — I5022 Chronic systolic (congestive) heart failure: Secondary | ICD-10-CM | POA: Diagnosis not present

## 2016-05-06 DIAGNOSIS — Z7901 Long term (current) use of anticoagulants: Secondary | ICD-10-CM | POA: Diagnosis not present

## 2016-05-06 NOTE — Telephone Encounter (Signed)
Remote ICM transmission received.  Attempted patient call and left detailed message regarding transmission and next ICM scheduled for 06/06/2016.  Advised to return call for any fluid symptoms or questions.

## 2016-05-06 NOTE — Progress Notes (Signed)
EPIC Encounter for ICM Monitoring  Patient Name: Erin Jarvis is a 51 y.o. female Date: 05/06/2016 Primary Care Physican: [name], MD Primary Cardiologist:Hochrein/McLean Electrophysiologist: Allred Dry Weight:Unknown     Attempted call to patient and unable to reach.  Left detailed message regarding transmission.  Transmission reviewed.   Thoracic impedance back at normal since 04/29/2016.  Impedance was above baseline from 04/09/18/18 to 04/28/2016 suggesting dryness but per epic note 04/24/2016 she had a diarrhea.  Labs: 04/22/2016 Creatinine 2.22, BUN 81, Potassium 4.4, Sodium 136, EGFR 24-28 04/14/2016 Creatinine 2.13, BUN 45, Potassium 4.5, Sodium 135, EGFR 25-29  04/13/2016 Creatinine 1.86, BUN 37, Potassium 4.4, Sodium 133, EGFR 30-35  04/12/2016 Creatinine 1.70, BUN 31, Potassium 4.3, Sodium 137, EGFR 33-39  04/11/2016 Creatinine 1.62, BUN 30, Potassium 4.2, Sodium 138, EGFR 35-41  04/10/2016 Creatinine 1.59, BUN 40, Potassium 3.4, Sodium 137, EGFR 36-42  04/09/2016 Creatinine 1.90, BUN 51, Potassium 4.4, Sodium 137, EGFR 29-34  04/08/2016 Creatinine 1.75, BUN 50, Potassium 4.2, Sodium 137, EGFR 32-37   Recommendations: Left voice mail with ICM number and encouraged to call for fluid symptoms.  Follow-up plan: ICM clinic phone appointment on 06/06/2016.  HF clinic office visit on 05/13/2016  Copy of ICM check sent to primary cardiologist and device physician.   3 month ICM trend: 05/06/2016   1 Year ICM trend:     [name], RN 05/06/2016 4:51 PM

## 2016-05-07 ENCOUNTER — Other Ambulatory Visit (HOSPITAL_COMMUNITY): Payer: Self-pay | Admitting: Pharmacist

## 2016-05-07 MED ORDER — APIXABAN 5 MG PO TABS: 5.0000 mg | ORAL_TABLET | Freq: Two times a day (BID) | ORAL | 3 refills | Status: DC

## 2016-05-07 NOTE — Progress Notes (Signed)
Needs repeat BMET.

## 2016-05-08 NOTE — Progress Notes (Signed)
Labs were done at [facility] in [place] at [place], [place] on 05/02/16:  Bun 54 CR 1.98 K 3.9

## 2016-05-09 ENCOUNTER — Other Ambulatory Visit (HOSPITAL_COMMUNITY): Payer: Self-pay | Admitting: Cardiology

## 2016-05-09 DIAGNOSIS — G4733 Obstructive sleep apnea (adult) (pediatric): Secondary | ICD-10-CM | POA: Diagnosis not present

## 2016-05-09 DIAGNOSIS — Z7901 Long term (current) use of anticoagulants: Secondary | ICD-10-CM | POA: Diagnosis not present

## 2016-05-09 DIAGNOSIS — I5022 Chronic systolic (congestive) heart failure: Secondary | ICD-10-CM | POA: Diagnosis not present

## 2016-05-09 DIAGNOSIS — I4891 Unspecified atrial fibrillation: Secondary | ICD-10-CM | POA: Diagnosis not present

## 2016-05-09 DIAGNOSIS — E119 Type 2 diabetes mellitus without complications: Secondary | ICD-10-CM | POA: Diagnosis not present

## 2016-05-09 DIAGNOSIS — I110 Hypertensive heart disease with heart failure: Secondary | ICD-10-CM | POA: Diagnosis not present

## 2016-05-09 NOTE — Progress Notes (Signed)
Patient returned call.  She stated she is feeling much better than she during her last hospitalization.  She now understands the importance of low salt diet and weighing daily.  She reported 20 lbs of fluid were removed when she was hospitalized and she can feel in her stomach area.  She does not have it in her extremities.  She is taking Eliquis for clot in her atrium and she said another TEE will be done to determine if the clot has resolved.   Today's weight is 278.8 lbs.  No changes today.  HF office appointment on 05/14/2016 and next ICM remote transmission is 06/03/2016.

## 2016-05-13 DIAGNOSIS — G4733 Obstructive sleep apnea (adult) (pediatric): Secondary | ICD-10-CM | POA: Diagnosis not present

## 2016-05-13 DIAGNOSIS — I4891 Unspecified atrial fibrillation: Secondary | ICD-10-CM | POA: Diagnosis not present

## 2016-05-13 DIAGNOSIS — I110 Hypertensive heart disease with heart failure: Secondary | ICD-10-CM | POA: Diagnosis not present

## 2016-05-13 DIAGNOSIS — Z7901 Long term (current) use of anticoagulants: Secondary | ICD-10-CM | POA: Diagnosis not present

## 2016-05-13 DIAGNOSIS — E119 Type 2 diabetes mellitus without complications: Secondary | ICD-10-CM | POA: Diagnosis not present

## 2016-05-13 DIAGNOSIS — I5022 Chronic systolic (congestive) heart failure: Secondary | ICD-10-CM | POA: Diagnosis not present

## 2016-05-14 ENCOUNTER — Encounter (HOSPITAL_COMMUNITY): Payer: Self-pay

## 2016-05-14 ENCOUNTER — Ambulatory Visit (HOSPITAL_COMMUNITY)
Admission: RE | Admit: 2016-05-14 | Discharge: 2016-05-14 | Disposition: A | Discharge status: Home / Self Care | Payer: Medicare Other | Source: Ambulatory Visit | Attending: Cardiology | Admitting: Cardiology

## 2016-05-14 VITALS — BP 123/71 | HR 82 | Wt 280.8 lb

## 2016-05-14 DIAGNOSIS — I481 Persistent atrial fibrillation: Secondary | ICD-10-CM

## 2016-05-14 DIAGNOSIS — R5383 Other fatigue: Secondary | ICD-10-CM

## 2016-05-14 DIAGNOSIS — I5022 Chronic systolic (congestive) heart failure: Secondary | ICD-10-CM | POA: Insufficient documentation

## 2016-05-14 DIAGNOSIS — N183 Chronic kidney disease, stage 3 (moderate): Secondary | ICD-10-CM | POA: Diagnosis not present

## 2016-05-14 DIAGNOSIS — Z7901 Long term (current) use of anticoagulants: Secondary | ICD-10-CM | POA: Diagnosis not present

## 2016-05-14 DIAGNOSIS — Z79899 Other long term (current) drug therapy: Secondary | ICD-10-CM | POA: Insufficient documentation

## 2016-05-14 DIAGNOSIS — I130 Hypertensive heart and chronic kidney disease with heart failure and stage 1 through stage 4 chronic kidney disease, or unspecified chronic kidney disease: Secondary | ICD-10-CM | POA: Insufficient documentation

## 2016-05-14 DIAGNOSIS — I4819 Other persistent atrial fibrillation: Secondary | ICD-10-CM

## 2016-05-14 DIAGNOSIS — Z95810 Presence of automatic (implantable) cardiac defibrillator: Secondary | ICD-10-CM | POA: Insufficient documentation

## 2016-05-14 DIAGNOSIS — I4891 Unspecified atrial fibrillation: Secondary | ICD-10-CM | POA: Insufficient documentation

## 2016-05-14 DIAGNOSIS — E1122 Type 2 diabetes mellitus with diabetic chronic kidney disease: Secondary | ICD-10-CM | POA: Diagnosis not present

## 2016-05-14 DIAGNOSIS — I428 Other cardiomyopathies: Secondary | ICD-10-CM | POA: Diagnosis not present

## 2016-05-14 DIAGNOSIS — I5020 Unspecified systolic (congestive) heart failure: Secondary | ICD-10-CM

## 2016-05-14 LAB — COMPREHENSIVE METABOLIC PANEL
ALBUMIN: 4 g/dL (ref 3.5–5.0)
ALT: 39 U/L (ref 14–54)
ANION GAP: 15 (ref 5–15)
AST: 39 U/L (ref 15–41)
Alkaline Phosphatase: 81 U/L (ref 38–126)
BILIRUBIN TOTAL: 0.8 mg/dL (ref 0.3–1.2)
BUN: 22 mg/dL — AB (ref 6–20)
CO2: 23 mmol/L (ref 22–32)
Calcium: 9 mg/dL (ref 8.9–10.3)
Chloride: 102 mmol/L (ref 101–111)
Creatinine, Ser: 1.6 mg/dL — ABNORMAL HIGH (ref 0.44–1.00)
GFR calc Af Amer: 41 mL/min — ABNORMAL LOW (ref >60)
GFR, EST NON AFRICAN AMERICAN: 36 mL/min — AB (ref >60)
GLUCOSE: 134 mg/dL — AB (ref 65–99)
POTASSIUM: 3.8 mmol/L (ref 3.5–5.1)
Sodium: 140 mmol/L (ref 135–145)
TOTAL PROTEIN: 7.4 g/dL (ref 6.5–8.1)

## 2016-05-14 LAB — TSH: TSH: 14.648 u[IU]/mL — ABNORMAL HIGH (ref 0.350–4.500)

## 2016-05-14 LAB — DIGOXIN LEVEL: DIGOXIN LVL: 1.7 ng/mL (ref 0.8–2.0)

## 2016-05-14 LAB — T4, FREE: FREE T4: 1.01 ng/dL (ref 0.61–1.12)

## 2016-05-14 MED ORDER — DIGOXIN 62.5 MCG PO TABS: 0.0625 mg | ORAL_TABLET | Freq: Every day | ORAL | 3 refills | Status: DC

## 2016-05-14 MED ORDER — METOPROLOL SUCCINATE ER 100 MG PO TB24
100.0000 mg | ORAL_TABLET | Freq: Two times a day (BID) | ORAL | 3 refills | Status: DC
Start: 2016-05-14 — End: 2017-01-01

## 2016-05-14 NOTE — Patient Instructions (Signed)
Labs today (will call for abnormal results, otherwise no news is good news)  Decrease digoxin to 0.0625 mg (1 Tablet) Once Daily  Increase Toprol XL to 100 mg (1 Tablet) Two times daily  Follow up in 1 Month

## 2016-05-14 NOTE — Progress Notes (Signed)
Medication Samples have been provided to the patient.  Drug name: Eliquis      Strength: 5 mg      Qty: 14 Tablets   LOT: [id number]   Exp.Date: Jan 2020  Dosing instructions: Take 1 Tablet Two times Daily  The patient has been instructed regarding the correct time, dose, and frequency of taking this medication, including desired effects and most common side effects.   Kennieth Rad 10:31 AM 05/14/2016

## 2016-05-14 NOTE — Progress Notes (Signed)
PCP: Dr. Sherrie Sport Cardiology: Dr. Percival Spanish HF Cardiology: Dr. 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 At one point EF improved to 45% from 20%.  She also has a history VT with Medtronic ICD placed in [place]. In 12/17, she was  admitted to [facility] with new onset atrial fibrillation/RVR and volume overload. Required IV diuresis and cardizem drip.  She remained in atrial fibrillation.  Warfarin was started that admission.   On 03/26/16, she saw Dr Percival Spanish and she was set up TEE/DCCV. Creatinine at that time was 2.03.  She had had a functional decline and dyspnea at rest. Weight at home had been trending up from 290 to 309 pounds.  SBP in 90s at Dr. Rosezella [place] office.   On 04/08/16, she presented for scheduled TEE/DC-CV, however thrombus was noted in LA appendage so DC-CV was not pursued. TEE also showed severe LV dysfunction. Dyspneic at rest. SBP soft.  She was admitted and ultimately required initiation of milrinone gtt + diuresis with IV Lasix and metolazone.  She lost > 20 lbs.  We were able to titrate her off milrinone and she was sent home on torsemide.  Rate was difficult to control, Toprol XL was titrated up to 75 mg bid.  She returns for followup.  She continues to do well since her discharge from the hospital.  Weight is up about 3 lbs here but stable on home scale.  No dyspnea walking on flat ground.  No orthopnea/PND.  Mild dyspnea walking up stairs/inclines.  No lightheadedness.  No chest pain.  No BRBPR or melena.  She is walking around a track for exercise, can go about 1/2 mile without stopping.  She is back in NSR on ECG today.  Labs (1/18): digoxin 0.5, K 4.5, creatinine 2.13, digoxin 1.7, AST 53, ALT 34, TSH elevated Labs (2/18): K 3.9, creatinine 1.98  ECG: NSR, anteroseptal Qs  PMH: 1. Chronic systolic CHF: Nonischemic cardiomyopathy.  Possible peri-partum cardiomyopathy, low EF  dates back to 1996.  EF initially 20%, rose to 45% at one point.   - h/o VT => Medtronic ICD.  - Echo (1/18): EF 25-30%, mildly dilated RV with normal systolic function, moderate TR, PASP 31 mmHg.  - LHC/RHC (1/18): No CAD; mean RA 19, PA 46/20 mean 35, mean PCWP 24, CI 2.37 (Thermo), CI 3.16 (Fick), PVR 1.9 WU.  2. Atrial fibrillation: Persistent.  Diagnosed 12/17 when she presented to [facility] with afib/RVR.  TEE/DCCV set up in 1/18 but no DCCV due to LA thrombus on TEE.   3. Gout 4. Type II diabetes 5. HTN 6. OSA: Uses CPAP 7. CKD stage III  SH: Divorced, on disability, lives with parents in [place], nonsmoker, no ETOH, no drugs.   Family History  Problem Relation Age of Onset  . Other Mother     has chronic pain syndrome from DDD with spinal cord stimulator  . Hypertension Mother   . COPD Father   . Arthritis/Rheumatoid Father    ROS: All systems reviewed and negative except as per HPI.   Current Outpatient Prescriptions  Medication Sig Dispense Refill  . amiodarone (PACERONE) 200 MG tablet Take 1 tablet (200 mg total) by mouth daily. 30 tablet 6  . apixaban (ELIQUIS) 5 MG TABS tablet Take 1 tablet (5 mg total) by mouth 2 (two) times daily. 180 tablet 3  . digoxin 62.5 MCG TABS Take 0.0625 mg by mouth daily. [place]  tablet 3  . metoprolol succinate (TOPROL-XL) 100 MG 24 hr tablet Take 1 tablet (100 mg total) by mouth 2 (two) times daily. 60 tablet 3  . Multiple Vitamin (MULTIVITAMIN) tablet Take 1 tablet by mouth daily.    Erin Jarvis spironolactone (ALDACTONE) 25 MG tablet Take 1 tablet (25 mg total) by mouth daily. 30 tablet 6  . torsemide (DEMADEX) 20 MG tablet Take 2 tablets (40 mg total) by mouth daily. 180 tablet 3  . allopurinol (ZYLOPRIM) 300 MG tablet Take 300 mg by mouth daily.      No current facility-administered medications for this encounter.    BP 123/71   Pulse 82   Wt 280 lb 12 oz (127.3 kg)   LMP 03/14/2014 Comment: neg preg test  SpO2 100%   BMI 43.97 kg/m   General: NAD, obese Neck: No JVD, no thyromegaly or thyroid nodule.  Lungs: Clear to auscultation bilaterally with normal respiratory effort. CV: Nondisplaced PMI.  Heart regular S1/S2, no S3/S4, no murmur.  No edema.  No carotid bruit.  Normal pedal pulses.  Abdomen: Soft, nontender, no hepatosplenomegaly, no distention.  Skin: Intact without lesions or rashes.  Neurologic: Alert and oriented x 3.  Psych: Normal affect. Extremities: No clubbing or cyanosis.  HEENT: Normal.   Assessment/Plan: 1. Chronic systolic CHF: Echo 123456 with EF 25-30%, nonischemic cardiomyopathy. Cardiomyopathy known since 74. Possibly peri-partum. SPEP negative.  Has Medtronic ICD. LHC (1/18) with no coronary disease.  She was admitted in 1/18 with extensive diuresis, required milrinone due to low output but was able to titrate off.  When RHC was done, cardiac output was ok off milrinone. NYHA class II symptoms now, she is no longer volume overloaded on exam.   - Continue torsemide 40 mg daily.  BMET today.  - Continue Toprol XL 75 mg bid.    - Continue spironolactone 25 daily.  - With elevated level, hold digoxin for a couple days then decrease to 0.0625 mg every other day.  She will need repeat digoxin level as trough in about 10 days.  - No ACEI/ARB/ARNI yet with elevated creatinine.  2. Atrial flutter/fibrillation:  Noted in 12/17.  TEE in 1/18 showed LA appendage thrombus. She is now on Eliquis. Suspect atrial fibrillation with RVR drove her recent CHF decompensation.  She is back in NSR today.  - Continue amiodarone 200 mg daily, hopefully will hold her in NSR.  Check LFTs/TSH again today => TSH mildly elevated and AST/ALT mildly elevated when last checked.  She will need regular eye exam while on amiodarone.    - Continue Toprol XL 75 bid.   - I think that she would benefit from atrial fibrillation ablation long-term, this may allow her to stop amiodarone.  I will refer her to EP after next appointment if  she maintains stability.  3. CKD: Stage III.  BMET today.   4. H/o VT: Has ICD.  Followup in 3 wks.   Erin Jarvis 05/14/2016

## 2016-05-15 LAB — T3, FREE: T3 FREE: 3.7 pg/mL (ref 2.0–4.4)

## 2016-05-19 ENCOUNTER — Telehealth (HOSPITAL_COMMUNITY): Payer: Self-pay | Admitting: Pharmacist

## 2016-05-19 NOTE — Telephone Encounter (Signed)
[employer] patient assistance approved for Eliquis 5 mg BID through 03/30/17. If she does not hear from them, she should call [phone] to verify shipping so that they can begin to send 3 month supplies to her home at no charge.   Ruta Hinds. Velva Harman, PharmD, BCPS, CPP Clinical Pharmacist Pager: [phone] Phone: [phone] 05/19/2016 11:13 AM

## 2016-05-21 DIAGNOSIS — I4891 Unspecified atrial fibrillation: Secondary | ICD-10-CM | POA: Diagnosis not present

## 2016-05-21 DIAGNOSIS — I110 Hypertensive heart disease with heart failure: Secondary | ICD-10-CM | POA: Diagnosis not present

## 2016-05-21 DIAGNOSIS — J019 Acute sinusitis, unspecified: Secondary | ICD-10-CM | POA: Diagnosis not present

## 2016-05-21 DIAGNOSIS — G4733 Obstructive sleep apnea (adult) (pediatric): Secondary | ICD-10-CM | POA: Diagnosis not present

## 2016-05-21 DIAGNOSIS — E119 Type 2 diabetes mellitus without complications: Secondary | ICD-10-CM | POA: Diagnosis not present

## 2016-05-21 DIAGNOSIS — I5022 Chronic systolic (congestive) heart failure: Secondary | ICD-10-CM | POA: Diagnosis not present

## 2016-05-21 DIAGNOSIS — Z7901 Long term (current) use of anticoagulants: Secondary | ICD-10-CM | POA: Diagnosis not present

## 2016-05-23 ENCOUNTER — Encounter (HOSPITAL_COMMUNITY): Payer: Self-pay | Admitting: *Deleted

## 2016-05-28 DIAGNOSIS — G4733 Obstructive sleep apnea (adult) (pediatric): Secondary | ICD-10-CM | POA: Diagnosis not present

## 2016-05-28 DIAGNOSIS — I4891 Unspecified atrial fibrillation: Secondary | ICD-10-CM | POA: Diagnosis not present

## 2016-05-28 DIAGNOSIS — I5022 Chronic systolic (congestive) heart failure: Secondary | ICD-10-CM | POA: Diagnosis not present

## 2016-05-28 DIAGNOSIS — I110 Hypertensive heart disease with heart failure: Secondary | ICD-10-CM | POA: Diagnosis not present

## 2016-05-28 DIAGNOSIS — Z7901 Long term (current) use of anticoagulants: Secondary | ICD-10-CM | POA: Diagnosis not present

## 2016-05-28 DIAGNOSIS — E119 Type 2 diabetes mellitus without complications: Secondary | ICD-10-CM | POA: Diagnosis not present

## 2016-06-06 ENCOUNTER — Telehealth: Payer: Self-pay

## 2016-06-06 ENCOUNTER — Ambulatory Visit (INDEPENDENT_AMBULATORY_CARE_PROVIDER_SITE_OTHER): Payer: Medicare Other

## 2016-06-06 DIAGNOSIS — I5022 Chronic systolic (congestive) heart failure: Secondary | ICD-10-CM

## 2016-06-06 DIAGNOSIS — Z95810 Presence of automatic (implantable) cardiac defibrillator: Secondary | ICD-10-CM

## 2016-06-06 NOTE — Telephone Encounter (Signed)
Remote ICM transmission received.  Attempted patient call and left message to return call.   

## 2016-06-06 NOTE — Progress Notes (Signed)
EPIC Encounter for ICM Monitoring  Patient Name: Erin Jarvis is a 51 y.o. female Date: 06/06/2016 Primary Care Physican: [name], MD Primary Cardiologist:Hochrein/McLean Electrophysiologist: Allred Dry Weight:Unknown                              Attempted call to patient and unable to reach.  Left message regarding transmission.  Transmission reviewed.   Thoracic impedance normal but was abnormal from 05/04/2016 to 05/28/2016 suggesting fluid accumulation.   Prescribed dosage Torsemide 20 mg 2 tablets (40 mg total) daily  Labs: 05/14/2016 Creatinine 1.60, BUN 22, Potassium 3.8, Sodium 140, EGFR 36-41 05/02/2016 Creatinine 1.98, BUN 54, Potassium 3.9, Sodium 139, EGFR 28-33 04/22/2016 Creatinine 2.22, BUN 81, Potassium 4.4, Sodium 136, EGFR 24-28 01/15/2018Creatinine 2.13, BUN 45, Potassium 4.5, Sodium 135, EGFR 25-29  01/14/2018Creatinine 1.86, BUN 37, Potassium 4.4, Sodium 133, EGFR 30-35  01/13/2018Creatinine 1.70, BUN 31, Potassium 4.3, Sodium 137, EGFR 33-39  01/12/2018Creatinine 1.62, BUN 30, Potassium 4.2, Sodium 138, EGFR 35-41  01/11/2018Creatinine 1.59, BUN 40, Potassium 3.4, Sodium 137, EGFR 36-42  01/10/2018Creatinine 1.90, BUN 51, Potassium 4.4, Sodium 137, EGFR 29-34  01/09/2018Creatinine 1.75, BUN 50, Potassium 4.2, Sodium 137, EGFR 32-37    Recommendations: NONE - Unable to reach patient   Follow-up plan: ICM clinic phone appointment on 07/07/2016.  HF clinic office appointment 06/13/2016  Copy of ICM check sent to primary cardiologist and device physician.   3 month ICM trend: 06/06/2016   1 Year ICM trend:      [name], RN 06/06/2016 8:58 AM

## 2016-06-13 ENCOUNTER — Ambulatory Visit (HOSPITAL_COMMUNITY)
Admission: RE | Admit: 2016-06-13 | Discharge: 2016-06-13 | Disposition: A | Discharge status: Home / Self Care | Payer: Medicare Other | Source: Ambulatory Visit | Attending: Cardiology | Admitting: Cardiology

## 2016-06-13 ENCOUNTER — Telehealth (HOSPITAL_COMMUNITY): Payer: Self-pay | Admitting: Vascular Surgery

## 2016-06-13 ENCOUNTER — Encounter (HOSPITAL_COMMUNITY): Payer: Self-pay

## 2016-06-13 ENCOUNTER — Telehealth (HOSPITAL_COMMUNITY): Payer: Self-pay | Admitting: *Deleted

## 2016-06-13 VITALS — BP 149/76 | HR 75 | Wt 276.0 lb

## 2016-06-13 DIAGNOSIS — I480 Paroxysmal atrial fibrillation: Secondary | ICD-10-CM

## 2016-06-13 DIAGNOSIS — N183 Chronic kidney disease, stage 3 unspecified: Secondary | ICD-10-CM

## 2016-06-13 DIAGNOSIS — I4891 Unspecified atrial fibrillation: Secondary | ICD-10-CM | POA: Insufficient documentation

## 2016-06-13 DIAGNOSIS — E039 Hypothyroidism, unspecified: Secondary | ICD-10-CM | POA: Diagnosis not present

## 2016-06-13 DIAGNOSIS — G4733 Obstructive sleep apnea (adult) (pediatric): Secondary | ICD-10-CM | POA: Diagnosis not present

## 2016-06-13 DIAGNOSIS — I130 Hypertensive heart and chronic kidney disease with heart failure and stage 1 through stage 4 chronic kidney disease, or unspecified chronic kidney disease: Secondary | ICD-10-CM | POA: Diagnosis not present

## 2016-06-13 DIAGNOSIS — I428 Other cardiomyopathies: Secondary | ICD-10-CM | POA: Insufficient documentation

## 2016-06-13 DIAGNOSIS — Z7901 Long term (current) use of anticoagulants: Secondary | ICD-10-CM | POA: Insufficient documentation

## 2016-06-13 DIAGNOSIS — E1122 Type 2 diabetes mellitus with diabetic chronic kidney disease: Secondary | ICD-10-CM | POA: Diagnosis not present

## 2016-06-13 DIAGNOSIS — Z95810 Presence of automatic (implantable) cardiac defibrillator: Secondary | ICD-10-CM | POA: Diagnosis not present

## 2016-06-13 DIAGNOSIS — I5022 Chronic systolic (congestive) heart failure: Secondary | ICD-10-CM | POA: Diagnosis not present

## 2016-06-13 DIAGNOSIS — Z79899 Other long term (current) drug therapy: Secondary | ICD-10-CM | POA: Insufficient documentation

## 2016-06-13 LAB — BASIC METABOLIC PANEL
ANION GAP: 13 (ref 5–15)
BUN: 43 mg/dL — AB (ref 6–20)
CALCIUM: 10.2 mg/dL (ref 8.9–10.3)
CO2: 24 mmol/L (ref 22–32)
Chloride: 102 mmol/L (ref 101–111)
Creatinine, Ser: 1.68 mg/dL — ABNORMAL HIGH (ref 0.44–1.00)
GFR calc Af Amer: 39 mL/min — ABNORMAL LOW (ref >60)
GFR, EST NON AFRICAN AMERICAN: 34 mL/min — AB (ref >60)
GLUCOSE: 150 mg/dL — AB (ref 65–99)
Potassium: 4.4 mmol/L (ref 3.5–5.1)
Sodium: 139 mmol/L (ref 135–145)

## 2016-06-13 LAB — DIGOXIN LEVEL: DIGOXIN LVL: 0.5 ng/mL — AB (ref 0.8–2.0)

## 2016-06-13 MED ORDER — DIGOXIN 62.5 MCG PO TABS: 0.0625 mg | ORAL_TABLET | ORAL | 3 refills | Status: DC

## 2016-06-13 MED ORDER — LISINOPRIL 5 MG PO TABS: 5.0000 mg | ORAL_TABLET | Freq: Every day | ORAL | 6 refills | Status: DC

## 2016-06-13 NOTE — Patient Instructions (Addendum)
Labs today (will call for abnormal results, otherwise no news is good news)  Repeat labs in 10-14 days at preferred location. Take Rx paper with you.  Will refer you to electrophysiology at [facility] with Dr. Rayann Heman to discuss Afib Ablation procedure. Address: [address] ([place]), [place],  [address]  Phone: [phone]  ________________________________________________________________________  ________________________________________________________________________  Randa Evens Digoxin for two days, then reduce to 0.0625 mg tablet once [place].  START Lisinopril 5 mg tablet once daily.  Follow up 6 weeks with Dr. Aundra Dubin  Do the following things EVERYDAY: 1) Weigh yourself in the morning before breakfast. Write it down and keep it in a log. 2) Take your medicines as prescribed 3) Eat low salt foods-Limit salt (sodium) to 2000 mg per day.  4) Stay as active as you can everyday 5) Limit all fluids for the day to less than 2 liters

## 2016-06-13 NOTE — Telephone Encounter (Signed)
Sent staff message to [facility] to call pt to schedule appt w/ Allred for Ablation

## 2016-06-13 NOTE — Telephone Encounter (Signed)
Notes recorded by Harvie Junior, CMA on 06/13/2016 at 12:17 PM EDT Pt aware of lab results and med change. Pt will have labs drawn closer to where she lives and have them faxed to [place] in 10 days. ------  Notes recorded by Larey Dresser, MD on 06/13/2016 at 12:15 PM EDT Digoxin ok. She can keep digoxin at 0.0625 daily rather than every other day. Needs BMET in 10 days after starting lisinopril.    Ref Range & Units 10:50 (06/13/16) 90mo ago (05/14/16) 10mo ago (04/22/16)   Digoxin Level 0.8 - 2.0 ng/mL 0.5   1.7  1.7

## 2016-06-15 NOTE — Progress Notes (Signed)
PCP: Dr. Sherrie Sport HF Cardiology: Dr. 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 At one point EF improved to 45% from 20%.  She also has a history VT with Medtronic ICD placed in [place]. In 12/17, she was  admitted to [facility] with new onset atrial fibrillation/RVR and volume overload. Required IV diuresis and cardizem drip.  She remained in atrial fibrillation.  Warfarin was started that admission.   On 03/26/16, she saw Dr Percival Spanish and she was set up TEE/DCCV. Creatinine at that time was 2.03.  She had had a functional decline and dyspnea at rest. Weight at home had been trending up from 290 to 309 pounds.  SBP in 90s at Dr. Rosezella [place] office.   On 04/08/16, she presented for scheduled TEE/DC-CV, however thrombus was noted in LA appendage so DC-CV was not pursued. TEE also showed severe LV dysfunction. Dyspneic at rest. SBP soft.  She was admitted and ultimately required initiation of milrinone gtt + diuresis with IV Lasix and metolazone.  She lost > 20 lbs.  We were able to titrate her off milrinone and she was sent home on torsemide.  Rate was difficult to control, Toprol XL was titrated up to 75 mg bid.   At appointment in 2/18, she was noted to be back in NSR.   She returns for followup.  Weight is down 4 lbs.  No dyspnea walking on flat ground, can walk 1/2 mile easily.  She walks on a treadmill for 20 minutes at the gym several times a week.  No orthopnea/PND.  No significant exertional dyspnea.  No lightheadedness.  No chest pain.  No BRBPR or melena.  She remains in NSR today. Not using CPAP right now, having technical problems with the machine.  Labs (1/18): digoxin 0.5, K 4.5, creatinine 2.13, digoxin 1.7, AST 53, ALT 34, TSH elevated Labs (2/18): K 3.9, creatinine 1.98 => 1.6, TSH 14, normal free T3 and free T4, LFTs normal, digoxin 1.7  ECG (personally reviewed): NSR, anteroseptal Qs, low  voltage  PMH: 1. Chronic systolic CHF: Nonischemic cardiomyopathy.  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  EF initially 20%, rose to 45% at one point.   - h/o VT => Medtronic ICD.  - Echo (1/18): EF 25-30%, mildly dilated RV with normal systolic function, moderate TR, PASP 31 mmHg.  - LHC/RHC (1/18): No CAD; mean RA 19, PA 46/20 mean 35, mean PCWP 24, CI 2.37 (Thermo), CI 3.16 (Fick), PVR 1.9 WU.  2. Atrial fibrillation: Persistent.  Diagnosed 12/17 when she presented to [facility] with afib/RVR.  TEE/DCCV set up in 1/18 but no DCCV due to LA thrombus on TEE.   3. Gout 4. Type II diabetes 5. HTN 6. OSA: Uses CPAP 7. CKD stage III 8. Subclinical hypothyroidism  SH: Divorced, on disability, lives with parents in [place], nonsmoker, no ETOH, no drugs.   Family History  Problem Relation Age of Onset  . Other Mother     has chronic pain syndrome from DDD with spinal cord stimulator  . Hypertension Mother   . COPD Father   . Arthritis/Rheumatoid Father    ROS: All systems reviewed and negative except as per HPI.   Current Outpatient Prescriptions  Medication Sig Dispense Refill  . allopurinol (ZYLOPRIM) 300 MG tablet Take 300 mg by mouth daily.     Marland Kitchen amiodarone (PACERONE) 200 MG tablet Take 1 tablet (200 mg total)  by mouth daily. 30 tablet 6  . apixaban (ELIQUIS) 5 MG TABS tablet Take 1 tablet (5 mg total) by mouth 2 (two) times daily. 180 tablet 3  . Digoxin 62.5 MCG TABS Take 0.0625 mg by mouth every other day. 45 tablet 3  . metoprolol succinate (TOPROL-XL) 100 MG 24 hr tablet Take 1 tablet (100 mg total) by mouth 2 (two) times daily. 60 tablet 3  . Multiple Vitamin (MULTIVITAMIN) tablet Take 1 tablet by mouth daily.    Marland Kitchen spironolactone (ALDACTONE) 25 MG tablet Take 1 tablet (25 mg total) by mouth daily. 30 tablet 6  . torsemide (DEMADEX) 20 MG tablet Take 2 tablets (40 mg total) by mouth daily. 180 tablet 3  . lisinopril (PRINIVIL,ZESTRIL) 5 MG tablet  Take 1 tablet (5 mg total) by mouth daily. 30 tablet 6   No current facility-administered medications for this encounter.    BP (!) 149/76   Pulse 75   Wt 276 lb (125.2 kg)   LMP 03/14/2014 Comment: neg preg test  SpO2 100%   BMI 43.23 kg/m  General: NAD, obese Neck: No JVD, no thyromegaly or thyroid nodule.  Lungs: Slight crackles at bases.  CV: Unable to palpate PMI.  Heart regular S1/S2, no S3/S4, no murmur.  No edema.  No carotid bruit.  Normal pedal pulses.  Abdomen: Soft, nontender, no hepatosplenomegaly, no distention.  Skin: Intact without lesions or rashes.  Neurologic: Alert and oriented x 3.  Psych: Normal affect. Extremities: No clubbing or cyanosis.  HEENT: Normal.   Assessment/Plan: 1. Chronic systolic CHF: Echo 2/40 with EF 25-30%, nonischemic cardiomyopathy. Cardiomyopathy known since 86. Possibly peri-partum. SPEP negative.  Has Medtronic ICD. LHC (1/18) with no coronary disease.  She was admitted in 1/18 with extensive diuresis, required milrinone due to low output but was able to titrate off.  When RHC was done, cardiac output was ok off milrinone. NYHA class II symptoms now, she is no longer volume overloaded on exam.   - Continue torsemide 40 mg daily.  BMET today.  - Continue Toprol XL 75 mg bid.    - Continue spironolactone 25 daily.  - She never decreased digoxin after high level last month. Level today is ok, she will continue digoxin 0.0625 mg daily.  - Add lisinopril 5 mg daily.  If she does ok with this, will eventually transition to [facility].  - BMET today and again in 10 days.   2. Atrial flutter/fibrillation:  Noted in 12/17.  TEE in 1/18 showed LA appendage thrombus. She is now on Eliquis. Suspect atrial fibrillation with RVR drove her recent CHF decompensation.  She remains in NSR today on amiodarone.  - Continue amiodarone 200 mg daily, hopefully will hold her in NSR.  She has subclinical hypothyroidism.  PCP prescribed Synthroid but she has not  taken yet.  I advised her to start.  LFTs recently normal.  She will need regular eye exam.    - Continue Toprol XL 75 bid.   - I think that she would benefit from atrial fibrillation ablation long-term, this may allow her to stop amiodarone.  I am going to have her see Dr. Rayann Heman.  3. CKD: Stage III.  BMET today.   4. H/o VT: Has ICD. 5. OSA: CPAP is not working properly.  She needs to call to get it fixed.   Followup in 6 wks.   Loralie Champagne 06/15/2016

## 2016-06-16 NOTE — Progress Notes (Signed)
Returned patient call and left detailed message.

## 2016-06-25 ENCOUNTER — Encounter: Payer: Medicare Other | Admitting: Internal Medicine

## 2016-06-25 DIAGNOSIS — I5022 Chronic systolic (congestive) heart failure: Secondary | ICD-10-CM | POA: Diagnosis not present

## 2016-07-01 DIAGNOSIS — I5022 Chronic systolic (congestive) heart failure: Secondary | ICD-10-CM | POA: Diagnosis not present

## 2016-07-01 DIAGNOSIS — Z79899 Other long term (current) drug therapy: Secondary | ICD-10-CM | POA: Diagnosis not present

## 2016-07-01 DIAGNOSIS — I481 Persistent atrial fibrillation: Secondary | ICD-10-CM | POA: Diagnosis not present

## 2016-07-01 DIAGNOSIS — I10 Essential (primary) hypertension: Secondary | ICD-10-CM | POA: Diagnosis not present

## 2016-07-01 DIAGNOSIS — M10061 Idiopathic gout, right knee: Secondary | ICD-10-CM | POA: Diagnosis not present

## 2016-07-07 ENCOUNTER — Ambulatory Visit (INDEPENDENT_AMBULATORY_CARE_PROVIDER_SITE_OTHER): Payer: Medicare Other | Admitting: *Deleted

## 2016-07-07 ENCOUNTER — Telehealth: Payer: Self-pay

## 2016-07-07 DIAGNOSIS — I428 Other cardiomyopathies: Secondary | ICD-10-CM

## 2016-07-07 DIAGNOSIS — Z95810 Presence of automatic (implantable) cardiac defibrillator: Secondary | ICD-10-CM

## 2016-07-07 DIAGNOSIS — I5022 Chronic systolic (congestive) heart failure: Secondary | ICD-10-CM | POA: Diagnosis not present

## 2016-07-07 NOTE — Progress Notes (Signed)
EPIC Encounter for ICM Monitoring  Patient Name: Erin Jarvis is a 51 y.o. female Date: 07/07/2016 Primary Care Physican: [name], MD Primary Cardiologist:Hochrein/McLean Electrophysiologist: Allred Dry Weight:Unknown      Attempted call to patient and unable to reach.  Left message to return call.  Transmission reviewed.    Thoracic impedance abnormal suggesting fluid accumulation since 06/30/2016.  Prescribed dosage Torsemide 20 mg 2 tablets (40 mg total) daily  Labs: 06/13/2016 Creatinine 1.68, BUN 43, Potassium 4.4, Sodium 138, EGFR 34-39 05/14/2016 Creatinine 1.60, BUN 22, Potassium 3.8, Sodium 140, EGFR 36-41 05/02/2016 Creatinine 1.98, BUN 54, Potassium 3.9, Sodium 139, EGFR 28-33 04/22/2016 Creatinine 2.22, BUN 81, Potassium 4.4, Sodium 136, EGFR 24-28 01/15/2018Creatinine 2.13, BUN 45, Potassium 4.5, Sodium 135, EGFR 25-29  01/14/2018Creatinine 1.86, BUN 37, Potassium 4.4, Sodium 133, EGFR 30-35  01/13/2018Creatinine 1.70, BUN 31, Potassium 4.3, Sodium 137, EGFR 33-39  01/12/2018Creatinine 1.62, BUN 30, Potassium 4.2, Sodium 138, EGFR 35-41  01/11/2018Creatinine 1.59, BUN 40, Potassium 3.4, Sodium 137, EGFR 36-42  01/10/2018Creatinine 1.90, BUN 51, Potassium 4.4, Sodium 137, EGFR 29-34  01/09/2018Creatinine 1.75, BUN 50, Potassium 4.2, Sodium 137, EGFR 32-37   Recommendations: NONE - Unable to reach patient   Follow-up plan: ICM clinic phone appointment on 08/07/2016.  Office appointment scheduled on 07/11/2016 with Dr Rayann Heman to discuss ablation and office visit with HF clinic 08/08/2016.  Copy of ICM check sent to primary cardiologist and device physician.   3 month ICM trend: 07/07/2016   1 Year ICM trend:      [name], RN 07/07/2016 9:12 AM

## 2016-07-07 NOTE — Telephone Encounter (Signed)
Remote ICM transmission received.  Attempted patient call and left message to return call.   

## 2016-07-08 ENCOUNTER — Ambulatory Visit: Payer: Self-pay | Admitting: *Deleted

## 2016-07-08 DIAGNOSIS — Z5181 Encounter for therapeutic drug level monitoring: Secondary | ICD-10-CM

## 2016-07-08 NOTE — Progress Notes (Signed)
Remote ICD transmission.   

## 2016-07-09 ENCOUNTER — Encounter: Payer: Self-pay | Admitting: Cardiology

## 2016-07-10 LAB — CUP PACEART REMOTE DEVICE CHECK
Brady Statistic RV Percent Paced: 0.01 %
Date Time Interrogation Session: 20180409073328
HighPow Impedance: 65 Ohm
HighPow Impedance: 79 Ohm
Implantable Lead Implant Date: 20100824
Implantable Lead Location: 753860
Lead Channel Pacing Threshold Amplitude: 1.375 V
Lead Channel Sensing Intrinsic Amplitude: 5.375 mV
Lead Channel Setting Pacing Amplitude: 3.75 V
MDC IDC MSMT BATTERY VOLTAGE: 2.84 V
MDC IDC MSMT LEADCHNL RV IMPEDANCE VALUE: 532 Ohm
MDC IDC MSMT LEADCHNL RV PACING THRESHOLD PULSEWIDTH: 0.4 ms
MDC IDC MSMT LEADCHNL RV SENSING INTR AMPL: 5.375 mV
MDC IDC PG IMPLANT DT: 20100824
MDC IDC SET LEADCHNL RV PACING PULSEWIDTH: 0.4 ms
MDC IDC SET LEADCHNL RV SENSING SENSITIVITY: 0.3 mV

## 2016-07-11 ENCOUNTER — Ambulatory Visit (INDEPENDENT_AMBULATORY_CARE_PROVIDER_SITE_OTHER): Payer: Medicare Other | Admitting: Internal Medicine

## 2016-07-11 ENCOUNTER — Encounter: Payer: Self-pay | Admitting: Internal Medicine

## 2016-07-11 VITALS — BP 124/75 | HR 67 | Ht 67.0 in | Wt 279.8 lb

## 2016-07-11 DIAGNOSIS — I5022 Chronic systolic (congestive) heart failure: Secondary | ICD-10-CM

## 2016-07-11 DIAGNOSIS — O903 Peripartum cardiomyopathy: Secondary | ICD-10-CM | POA: Diagnosis not present

## 2016-07-11 DIAGNOSIS — I472 Ventricular tachycardia, unspecified: Secondary | ICD-10-CM

## 2016-07-11 DIAGNOSIS — I4819 Other persistent atrial fibrillation: Secondary | ICD-10-CM

## 2016-07-11 DIAGNOSIS — I481 Persistent atrial fibrillation: Secondary | ICD-10-CM | POA: Diagnosis not present

## 2016-07-11 NOTE — Patient Instructions (Addendum)
Medication Instructions:  Continue all current medications.  Labwork: none  Testing/Procedures: none  Follow-Up: As scheduled   Any Other Special Instructions Will Be Listed Below (If Applicable).  Dr. Cottie Banda [facility] - info provided.  If you need a refill on your cardiac medications before your next appointment, please call your pharmacy.

## 2016-07-11 NOTE — Progress Notes (Signed)
PCP: [name], MD Primary Cardiologist: Rolin Barry is a 51 y.o. female who presents today for routine electrophysiology followup.  Since last being seen in our clinic, the patient reports doing very well.  She has developed afib.  She was quite ill earlier this year.  She had advanced CHF and required milrinone.  She has made slow improvement.  She is on amiodarone.  Today, she denies symptoms of palpitations, chest pain, dizziness, presyncope, syncope..  The patient is otherwise without complaint today.   Past Medical History:  Diagnosis Date  . AICD (automatic cardioverter/defibrillator) present 2002, 2010   secondary to post-partum cardiomyopathy (1996)  . CHF (congestive heart failure) ([place])   . Diabetes mellitus without complication ([place])   . Hypertension   . Hypertensive cardiovascular disease   . Obesity   . Persistent atrial fibrillation ([place])   . Postpartum cardiomyopathy    Last echocardiogram was last year and EF 45 -50 %  . Seasonal allergies   . Sleep apnea    complaint with CPAP  . Ventricular tachycardia ([facility])    s/p ICD Implantation (MDT Secura VR)   Past Surgical History:  Procedure Laterality Date  . CARDIAC CATHETERIZATION N/A 04/11/2016   Procedure: Right/Left Heart Cath and Coronary Angiography;  Surgeon: Larey Dresser, MD;  Location: [place] CV LAB;  Service: Cardiovascular;  Laterality: N/A;  . CARDIAC DEFIBRILLATOR PLACEMENT  08/12/2000, 11/11/2008   Most recent Generator MDT Secura VR placed in [place]  . CESAREAN SECTION  1996  . CHOLECYSTECTOMY  2004  . IR GENERIC HISTORICAL  06/13/2014   IR RADIOLOGIST EVAL & MGMT 06/13/2014 Greggory Keen, MD [facility] INTERV RAD  . IR GENERIC HISTORICAL  04/09/2016   IR FLUORO GUIDE CV LINE LEFT 04/09/2016 Greggory Keen, MD [facility]  . IR GENERIC HISTORICAL  04/09/2016   IR US GUIDE VASC ACCESS RIGHT 04/09/2016 Greggory Keen, MD [facility]  . IR GENERIC HISTORICAL  04/09/2016   IR  VENO/EXT/UNI RIGHT 04/09/2016 Greggory Keen, MD [facility]  . IR GENERIC HISTORICAL  04/09/2016   IR US GUIDE VASC ACCESS LEFT 04/09/2016 Greggory Keen, MD [facility]  . TEE WITHOUT CARDIOVERSION N/A 04/08/2016   Procedure: TRANSESOPHAGEAL ECHOCARDIOGRAM (TEE);  Surgeon: Pixie Casino, MD;  Location: [facility] ENDOSCOPY;  Service: Cardiovascular;  Laterality: N/A;    Current Outpatient Prescriptions  Medication Sig Dispense Refill  . amiodarone (PACERONE) 200 MG tablet Take 1 tablet (200 mg total) by mouth daily. 30 tablet 6  . apixaban (ELIQUIS) 5 MG TABS tablet Take 1 tablet (5 mg total) by mouth 2 (two) times daily. 180 tablet 3  . Digoxin 62.5 MCG TABS Take 0.0625 mg by mouth every other day. 45 tablet 3  . levothyroxine (SYNTHROID, LEVOTHROID) 100 MCG tablet Take 100 mcg by mouth daily before breakfast.    . lisinopril (PRINIVIL,ZESTRIL) 5 MG tablet Take 1 tablet (5 mg total) by mouth daily. 30 tablet 6  . metFORMIN (GLUCOPHAGE) 500 MG tablet Take by mouth 2 (two) times daily with a meal.    . metoprolol succinate (TOPROL-XL) 100 MG 24 hr tablet Take 1 tablet (100 mg total) by mouth 2 (two) times daily. 60 tablet 3  . Multiple Vitamin (MULTIVITAMIN) tablet Take 1 tablet by mouth daily.    Marland Kitchen spironolactone (ALDACTONE) 25 MG tablet Take 1 tablet (25 mg total) by mouth daily. 30 tablet 6  . torsemide (DEMADEX) 20 MG tablet Take 2 tablets (40 mg total) by  mouth daily. 180 tablet 3   No current facility-administered medications for this visit.     Physical Exam: Vitals:   07/11/16 1230  BP: 124/75  Pulse: 67  SpO2: 99%  Weight: 279 lb 12.8 oz (126.9 kg)  Height: 5\' 7"  (1.702 m)    GEN- The patient is obese appearing, alert and oriented x 3 today.   Head- normocephalic, atraumatic Eyes-  Sclera clear, conjunctiva pink Ears- hearing intact Oropharynx- clear Lungs- Clear to ausculation bilaterally, normal work of breathing Chest- ICD pocket is well healed Heart- Regular rate and  rhythm, no murmurs, rubs or gallops, PMI not laterally displaced GI- soft, NT, ND, + BS Extremities- no clubbing, cyanosis, or edema  ICD interrogation- reviewed in detail today,  See PACEART report ekg tracing from 06/13/16 is reviewed personally   Assessment and Plan:  1. Postpartum cardiomyopathy  clnically stable Followed in [facility] clinic   2. Ventricular tachycardia  Normal ICD function today See Pace Art report Interestingly, she did have monomorphic VT with CL 220 msec 04/29/16 for which she was successfully cardioverted with a 34 J shock.  This appears to have occurred in the setting of acute decompensated CHF and likely while on milrinone.  No changes today.  3. OSA Clinically improved with CPAP  4. Obesity Body mass index is 43.82 kg/m. Unfortunately, she has not made any real progress with this.  Lifestyle modification again discussed today.   We discussed bariatric surgery referral and information has been provided for her to contact the [facility] Bariatric program.  5. Persistent afib I worry that her anticipated success with ablation is reduced given her morbid obesity.  Lifestyle modification is essential long term. Therapeutic strategies for afib including medicine and ablation were discussed in detail with the patient today. Risk, benefits, and alternatives to EP study and radiofrequency ablation for afib were also discussed in detail today. At this time, she would prefer to continue lifestyle modification and amiodarone. She is going to consider bariatric surgery which I have encouraged.  She understands risks of amiodarone but would prefer to continue this for now.  Carelink every 3 months, ICM monthly  Very complicated patient.  A high level of decision making was required for this encounter.  Thompson Grayer MD, [facility]

## 2016-07-15 ENCOUNTER — Encounter: Payer: Self-pay | Admitting: Internal Medicine

## 2016-07-15 LAB — CUP PACEART INCLINIC DEVICE CHECK
Battery Voltage: 2.84 V
HighPow Impedance: 58 Ohm
HighPow Impedance: 74 Ohm
Implantable Lead Implant Date: 20100824
Implantable Lead Location: 753860
Implantable Lead Model: 6947
Implantable Pulse Generator Implant Date: 20100824
Lead Channel Pacing Threshold Pulse Width: 0.4 ms
Lead Channel Setting Pacing Pulse Width: 0.4 ms
Lead Channel Setting Sensing Sensitivity: 0.3 mV
MDC IDC MSMT LEADCHNL RV IMPEDANCE VALUE: 437 Ohm
MDC IDC MSMT LEADCHNL RV PACING THRESHOLD AMPLITUDE: 1.75 V
MDC IDC MSMT LEADCHNL RV SENSING INTR AMPL: 4.625 mV
MDC IDC MSMT LEADCHNL RV SENSING INTR AMPL: 4.625 mV
MDC IDC SESS DTM: 20180413170134
MDC IDC SET LEADCHNL RV PACING AMPLITUDE: 3.75 V
MDC IDC STAT BRADY RV PERCENT PACED: 0.04 %

## 2016-07-25 ENCOUNTER — Other Ambulatory Visit (HOSPITAL_COMMUNITY): Payer: Self-pay | Admitting: Cardiology

## 2016-07-25 DIAGNOSIS — R300 Dysuria: Secondary | ICD-10-CM | POA: Diagnosis not present

## 2016-08-04 DIAGNOSIS — G4733 Obstructive sleep apnea (adult) (pediatric): Secondary | ICD-10-CM | POA: Diagnosis not present

## 2016-08-04 DIAGNOSIS — I513 Intracardiac thrombosis, not elsewhere classified: Secondary | ICD-10-CM | POA: Diagnosis not present

## 2016-08-04 DIAGNOSIS — Z9989 Dependence on other enabling machines and devices: Secondary | ICD-10-CM | POA: Diagnosis not present

## 2016-08-04 DIAGNOSIS — E119 Type 2 diabetes mellitus without complications: Secondary | ICD-10-CM | POA: Diagnosis not present

## 2016-08-04 DIAGNOSIS — I4891 Unspecified atrial fibrillation: Secondary | ICD-10-CM | POA: Diagnosis not present

## 2016-08-04 DIAGNOSIS — Z86718 Personal history of other venous thrombosis and embolism: Secondary | ICD-10-CM | POA: Diagnosis not present

## 2016-08-04 DIAGNOSIS — I10 Essential (primary) hypertension: Secondary | ICD-10-CM | POA: Diagnosis not present

## 2016-08-04 DIAGNOSIS — Z8719 Personal history of other diseases of the digestive system: Secondary | ICD-10-CM | POA: Diagnosis not present

## 2016-08-04 DIAGNOSIS — Z7901 Long term (current) use of anticoagulants: Secondary | ICD-10-CM | POA: Diagnosis not present

## 2016-08-04 DIAGNOSIS — Z6841 Body Mass Index (BMI) 40.0 and over, adult: Secondary | ICD-10-CM | POA: Diagnosis not present

## 2016-08-04 DIAGNOSIS — I509 Heart failure, unspecified: Secondary | ICD-10-CM | POA: Diagnosis not present

## 2016-08-04 DIAGNOSIS — E785 Hyperlipidemia, unspecified: Secondary | ICD-10-CM | POA: Diagnosis not present

## 2016-08-07 ENCOUNTER — Telehealth: Payer: Self-pay

## 2016-08-07 ENCOUNTER — Ambulatory Visit (INDEPENDENT_AMBULATORY_CARE_PROVIDER_SITE_OTHER): Payer: Medicare Other

## 2016-08-07 DIAGNOSIS — Z95810 Presence of automatic (implantable) cardiac defibrillator: Secondary | ICD-10-CM

## 2016-08-07 DIAGNOSIS — I5022 Chronic systolic (congestive) heart failure: Secondary | ICD-10-CM

## 2016-08-07 NOTE — Progress Notes (Signed)
EPIC Encounter for ICM Monitoring  Patient Name: Erin Jarvis is a 51 y.o. female Date: 08/07/2016 Primary Care Physican: [name], MD Primary Cardiologist:Hochrein/McLean Electrophysiologist: Allred Dry Weight:Unknown      Attempted call to patient and unable to reach.  Left detailed message regarding transmission.  Transmission reviewed.    Thoracic impedance normal last 2 days but has been abnormal suggesting fluid accumulation for most of the days in since 07/07/2016.  Prescribed dosage Torsemide 20 mg 2 tablets (40 mg total) daily  Labs: 05/14/2016 Creatinine 1.60, BUN 22, Potassium 3.8, Sodium 140, EGFR 36-41 05/02/2016 Creatinine 1.98, BUN 54, Potassium 3.9, Sodium 139, EGFR 28-33 04/22/2016 Creatinine 2.22, BUN 81, Potassium 4.4, Sodium 136, EGFR 24-28 01/15/2018Creatinine 2.13, BUN 45, Potassium 4.5, Sodium 135, EGFR 25-29  01/14/2018Creatinine 1.86, BUN 37, Potassium 4.4, Sodium 133, EGFR 30-35  01/13/2018Creatinine 1.70, BUN 31, Potassium 4.3, Sodium 137, EGFR 33-39  01/12/2018Creatinine 1.62, BUN 30, Potassium 4.2, Sodium 138, EGFR 35-41  01/11/2018Creatinine 1.59, BUN 40, Potassium 3.4, Sodium 137, EGFR 36-42  01/10/2018Creatinine 1.90, BUN 51, Potassium 4.4, Sodium 137, EGFR 29-34  01/09/2018Creatinine 1.75, BUN 50, Potassium 4.2, Sodium 137, EGFR 32-37   Recommendations: Left voice mail with ICM number and encouraged to call for fluid symptoms.     Follow-up plan: ICM clinic phone appointment on 09/08/2016.  Office appointment scheduled on 08/08/2016 with HF clinic.  Copy of ICM check sent to primary cardiologist and device physician.   3 month ICM trend: 08/07/2016   1 Year ICM trend:  Carelink unable to generate reports today to view 1 year trend    [name], RN 08/07/2016 5:32 PM

## 2016-08-07 NOTE — Telephone Encounter (Signed)
Remote ICM transmission received.  Attempted patient call and left detailed message regarding transmission and next ICM scheduled for 09/08/2016.  Advised to return call for any fluid symptoms or questions.

## 2016-08-08 ENCOUNTER — Ambulatory Visit (HOSPITAL_COMMUNITY)
Admission: RE | Admit: 2016-08-08 | Discharge: 2016-08-08 | Disposition: A | Discharge status: Home / Self Care | Payer: Medicare Other | Source: Ambulatory Visit | Attending: Cardiology | Admitting: Cardiology

## 2016-08-08 ENCOUNTER — Encounter (HOSPITAL_COMMUNITY): Payer: Self-pay

## 2016-08-08 VITALS — BP 132/84 | HR 70 | Ht 65.0 in | Wt 280.2 lb

## 2016-08-08 DIAGNOSIS — I480 Paroxysmal atrial fibrillation: Secondary | ICD-10-CM

## 2016-08-08 DIAGNOSIS — E039 Hypothyroidism, unspecified: Secondary | ICD-10-CM | POA: Insufficient documentation

## 2016-08-08 DIAGNOSIS — G4733 Obstructive sleep apnea (adult) (pediatric): Secondary | ICD-10-CM | POA: Insufficient documentation

## 2016-08-08 DIAGNOSIS — E1122 Type 2 diabetes mellitus with diabetic chronic kidney disease: Secondary | ICD-10-CM | POA: Diagnosis not present

## 2016-08-08 DIAGNOSIS — I420 Dilated cardiomyopathy: Secondary | ICD-10-CM

## 2016-08-08 DIAGNOSIS — Z7984 Long term (current) use of oral hypoglycemic drugs: Secondary | ICD-10-CM | POA: Diagnosis not present

## 2016-08-08 DIAGNOSIS — M109 Gout, unspecified: Secondary | ICD-10-CM | POA: Diagnosis not present

## 2016-08-08 DIAGNOSIS — N183 Chronic kidney disease, stage 3 unspecified: Secondary | ICD-10-CM

## 2016-08-08 DIAGNOSIS — Z79899 Other long term (current) drug therapy: Secondary | ICD-10-CM | POA: Insufficient documentation

## 2016-08-08 DIAGNOSIS — I130 Hypertensive heart and chronic kidney disease with heart failure and stage 1 through stage 4 chronic kidney disease, or unspecified chronic kidney disease: Secondary | ICD-10-CM | POA: Diagnosis not present

## 2016-08-08 DIAGNOSIS — I429 Cardiomyopathy, unspecified: Secondary | ICD-10-CM | POA: Insufficient documentation

## 2016-08-08 DIAGNOSIS — I4891 Unspecified atrial fibrillation: Secondary | ICD-10-CM

## 2016-08-08 DIAGNOSIS — I5022 Chronic systolic (congestive) heart failure: Secondary | ICD-10-CM | POA: Diagnosis not present

## 2016-08-08 MED ORDER — SACUBITRIL-VALSARTAN 24-26 MG PO TABS: 1.0000 | ORAL_TABLET | Freq: Two times a day (BID) | ORAL | 3 refills | Status: DC

## 2016-08-08 MED ORDER — APIXABAN 5 MG PO TABS: 5.0000 mg | ORAL_TABLET | Freq: Two times a day (BID) | ORAL | 3 refills | Status: DC

## 2016-08-08 MED ORDER — SACUBITRIL-VALSARTAN 24-26 MG PO TABS: 1.0000 | ORAL_TABLET | Freq: Two times a day (BID) | ORAL | 11 refills | Status: DC

## 2016-08-08 NOTE — Patient Instructions (Signed)
Stop Lisinopril  Start Entresto 24/26 mg Twice daily STARTING on Sunday 08/10/16  Labs in 10 days  Your physician has requested that you have an echocardiogram. Echocardiography is a painless test that uses sound waves to create images of your heart. It provides your doctor with information about the size and shape of your heart and how well your heart's chambers and valves are working. This procedure takes approximately one hour. There are no restrictions for this procedure.  You have been referred to Cardiac Rehab, they will call you to schedule  You have been referred to [facility], Dr Joelyn Oms, their office will call you to schedule  Your physician recommends that you schedule a follow-up appointment in: 6 weeks

## 2016-08-08 NOTE — Progress Notes (Signed)
Pt provided Entresto 30 day free card, application completed and signed by patient for [employer] Pt Asst.

## 2016-08-09 NOTE — Progress Notes (Signed)
PCP: Dr. Sherrie Sport HF Cardiology: Dr. 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 At one point EF improved to 45% from 20%.  She also has a history VT with Medtronic ICD placed in [place]. In 12/17, she was  admitted to [facility] with new onset atrial fibrillation/RVR and volume overload. Required IV diuresis and cardizem drip.  She remained in atrial fibrillation.  Warfarin was started that admission.   On 03/26/16, she saw Dr Percival Spanish and she was set up TEE/DCCV. Creatinine at that time was 2.03.  She had had a functional decline and dyspnea at rest. Weight at home had been trending up from 290 to 309 pounds.  SBP in 90s at Dr. Rosezella [place] office.   On 04/08/16, she presented for scheduled TEE/DC-CV, however thrombus was noted in LA appendage so DC-CV was not pursued. TEE also showed severe LV dysfunction. Dyspneic at rest. SBP soft.  She was admitted and ultimately required initiation of milrinone gtt + diuresis with IV Lasix and metolazone.  She lost > 20 lbs.  We were able to titrate her off milrinone and she was sent home on torsemide.  Rate was difficult to control, Toprol XL was titrated up to 75 mg bid.   At appointment in 2/18, she was noted to be back in NSR.  She remains in NSR today.   She returns for followup.  Weight is up 4 lbs.  No dyspnea walking on flat ground.  She walks on a treadmill for 20 minutes at the gym several times a week.  No orthopnea/PND.  No tachypalpitations.  No lightheadedness.  No chest pain.  No BRBPR or melena.  Uses CPAP.  She has seen a bariatric surgeon and wants to work towards a gastric bypass.   Labs (1/18): digoxin 0.5, K 4.5, creatinine 2.13, digoxin 1.7, AST 53, ALT 34, TSH elevated Labs (2/18): K 3.9, creatinine 1.98 => 1.6, TSH 14, normal free T3 and free T4, LFTs normal, digoxin 1.7 Labs (3/18): K 3.8, creatinine 1.8, digoxin 1.0  ECG (personally reviewed):  NSR, poor RWP  Optivol; Fluid index currently < threshold with higher impedance.  However, a week or so ago, fluid index was > threshold.  PMH: 1. Chronic systolic CHF: Nonischemic cardiomyopathy.  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  EF initially 20%, rose to 45% at one point.   - h/o VT => Medtronic ICD.  - Echo (1/18): EF 25-30%, mildly dilated RV with normal systolic function, moderate TR, PASP 31 mmHg.  - LHC/RHC (1/18): No CAD; mean RA 19, PA 46/20 mean 35, mean PCWP 24, CI 2.37 (Thermo), CI 3.16 (Fick), PVR 1.9 WU.  2. Atrial fibrillation: Persistent.  Diagnosed 12/17 when she presented to [facility] with afib/RVR.  TEE/DCCV set up in 1/18 but no DCCV due to LA thrombus on TEE.   3. Gout 4. Type II diabetes 5. HTN 6. OSA: Uses CPAP 7. CKD stage III: Has history of atrophic left kidney  8. Subclinical hypothyroidism 9. VT: 1/18, had ICD discharge.   SH: Divorced, on disability, lives with parents in [place], nonsmoker, no ETOH, no drugs.   Family History  Problem Relation Age of Onset  . Other Mother        has chronic pain syndrome from DDD with spinal cord stimulator  . Hypertension Mother   . COPD Father   . Arthritis/Rheumatoid Father    ROS: All systems  reviewed and negative except as per HPI.   Current Outpatient Prescriptions  Medication Sig Dispense Refill  . amiodarone (PACERONE) 200 MG tablet Take 1 tablet (200 mg total) by mouth daily. 30 tablet 6  . apixaban (ELIQUIS) 5 MG TABS tablet Take 1 tablet (5 mg total) by mouth 2 (two) times daily. 180 tablet 3  . Digoxin 62.5 MCG TABS Take 0.0625 mg by mouth every other day. 45 tablet 3  . levothyroxine (SYNTHROID, LEVOTHROID) 100 MCG tablet Take 100 mcg by mouth daily before breakfast.    . metFORMIN (GLUCOPHAGE) 500 MG tablet Take by mouth 2 (two) times daily with a meal.    . metoprolol succinate (TOPROL-XL) 100 MG 24 hr tablet Take 1 tablet (100 mg total) by mouth 2 (two) times daily.  60 tablet 3  . Multiple Vitamin (MULTIVITAMIN) tablet Take 1 tablet by mouth daily.    Marland Kitchen spironolactone (ALDACTONE) 25 MG tablet Take 1 tablet (25 mg total) by mouth daily. 30 tablet 6  . torsemide (DEMADEX) 20 MG tablet Take 2 tablets (40 mg total) by mouth daily. 180 tablet 3  . sacubitril-valsartan (ENTRESTO) 24-26 MG Take 1 tablet by mouth 2 (two) times daily. 60 tablet 11   No current facility-administered medications for this encounter.    BP 132/84   Pulse 70   Ht 5\' 5"  (1.651 m)   Wt 280 lb 4 oz (127.1 kg)   LMP 03/14/2014 Comment: neg preg test  SpO2 97%   BMI 46.64 kg/m  General: NAD, obese Neck: JVP 7 cm, no thyromegaly or thyroid nodule.  Lungs: CTAB.  CV: Unable to palpate PMI.  Heart regular S1/S2, no S3/S4, no murmur.  No edema.  No carotid bruit.  Normal pedal pulses.  Abdomen: Soft, nontender, no hepatosplenomegaly, no distention.  Skin: Intact without lesions or rashes.  Neurologic: Alert and oriented x 3.  Psych: Normal affect. Extremities: No clubbing or cyanosis.  HEENT: Normal.   Assessment/Plan: 1. Chronic systolic CHF: Echo 2/50 with EF 25-30%, nonischemic cardiomyopathy. Cardiomyopathy known since 23. Possibly peri-partum. SPEP negative.  Has Medtronic ICD. LHC (1/18) with no coronary disease.  She was admitted in 1/18 with extensive diuresis, required milrinone due to low output but was able to titrate off.  When RHC was done, cardiac output was ok off milrinone. NYHA class II symptoms now, she is no longer volume overloaded on exam.   - Continue torsemide 40 mg daily.  BMET today.  - Continue Toprol XL 75 mg bid.    - Continue spironolactone 25 daily.  - Continue digoxin 0.0625 mg daily, check level today.  - Stop lisinopril, in 36 hrs start Entresto 24/26 bid.  She tolerated lisinopril without a significant change in creatinine.  Will repeat BMET in 10 days.    - I will arrange for a repeat echo.  - Cardiac rehab referral to hospital in  [place].  2. Atrial flutter/fibrillation:  Noted in 12/17.  TEE in 1/18 showed LA appendage thrombus. She is now on Eliquis. Suspect atrial fibrillation with RVR drove her recent CHF decompensation.  She remains in NSR today on amiodarone.  - Continue amiodarone 200 mg daily, hopefully will hold her in NSR.  She has subclinical hypothyroidism and is taking Levoxyl.  Check LFTs today.  She will need regular eye exam.    - Continue Toprol XL 75 bid.   - Continue Eliquis, check CBC.  - She saw Dr. Rayann Heman to discuss atrial fibrillation ablation.  He would like  to see weight loss before attempting this.  She is looking into a bariatric surgery program.   3. CKD: Stage III.  She has an atrophic left kidney. I would like her to establish with a nephrologist, will make referral.  4. H/o VT: Has ICD. 5. OSA: Encouraged to use CPAP.    Followup in 6 wks.   Loralie Champagne 08/09/2016

## 2016-08-11 ENCOUNTER — Encounter (HOSPITAL_COMMUNITY): Payer: Self-pay | Admitting: *Deleted

## 2016-08-11 NOTE — Progress Notes (Signed)
Referral form and pt's records faxed to [facility]

## 2016-08-13 ENCOUNTER — Telehealth (HOSPITAL_COMMUNITY): Payer: Self-pay | Admitting: Pharmacist

## 2016-08-13 NOTE — Telephone Encounter (Signed)
[employer] patient assistance approved for [employer]xair 24-26 mg BID through 08/13/17.   Ruta Hinds. Velva Harman, PharmD, BCPS, CPP Clinical Pharmacist Pager: [phone] Phone: [phone] 08/13/2016 12:48 PM

## 2016-08-19 DIAGNOSIS — Z9989 Dependence on other enabling machines and devices: Secondary | ICD-10-CM | POA: Diagnosis not present

## 2016-08-19 DIAGNOSIS — B372 Candidiasis of skin and nail: Secondary | ICD-10-CM | POA: Diagnosis not present

## 2016-08-19 DIAGNOSIS — I509 Heart failure, unspecified: Secondary | ICD-10-CM | POA: Diagnosis not present

## 2016-08-19 DIAGNOSIS — E039 Hypothyroidism, unspecified: Secondary | ICD-10-CM | POA: Diagnosis not present

## 2016-08-19 DIAGNOSIS — Z713 Dietary counseling and surveillance: Secondary | ICD-10-CM | POA: Diagnosis not present

## 2016-08-19 DIAGNOSIS — G4733 Obstructive sleep apnea (adult) (pediatric): Secondary | ICD-10-CM | POA: Diagnosis not present

## 2016-08-19 DIAGNOSIS — I110 Hypertensive heart disease with heart failure: Secondary | ICD-10-CM | POA: Diagnosis not present

## 2016-08-19 DIAGNOSIS — Z6841 Body Mass Index (BMI) 40.0 and over, adult: Secondary | ICD-10-CM | POA: Diagnosis not present

## 2016-08-19 DIAGNOSIS — E119 Type 2 diabetes mellitus without complications: Secondary | ICD-10-CM | POA: Diagnosis not present

## 2016-08-21 ENCOUNTER — Ambulatory Visit (HOSPITAL_COMMUNITY)
Admission: RE | Admit: 2016-08-21 | Discharge: 2016-08-21 | Disposition: A | Discharge status: Home / Self Care | Payer: Medicare Other | Source: Ambulatory Visit | Attending: Internal Medicine | Admitting: Internal Medicine

## 2016-08-21 DIAGNOSIS — I420 Dilated cardiomyopathy: Secondary | ICD-10-CM | POA: Diagnosis not present

## 2016-08-21 LAB — BASIC METABOLIC PANEL
ANION GAP: 14 (ref 5–15)
BUN: 45 mg/dL — ABNORMAL HIGH (ref 6–20)
CHLORIDE: 101 mmol/L (ref 101–111)
CO2: 22 mmol/L (ref 22–32)
CREATININE: 1.85 mg/dL — AB (ref 0.44–1.00)
Calcium: 9.9 mg/dL (ref 8.9–10.3)
GFR calc non Af Amer: 30 mL/min — ABNORMAL LOW (ref >60)
GFR, EST AFRICAN AMERICAN: 35 mL/min — AB (ref >60)
GLUCOSE: 128 mg/dL — AB (ref 65–99)
Potassium: 3.9 mmol/L (ref 3.5–5.1)
Sodium: 137 mmol/L (ref 135–145)

## 2016-08-21 NOTE — Progress Notes (Signed)
Echocardiogram 2D Echocardiogram has been performed.  Erin Jarvis 08/21/2016, 3:52 PM

## 2016-08-26 ENCOUNTER — Other Ambulatory Visit: Payer: Self-pay | Admitting: Internal Medicine

## 2016-08-28 ENCOUNTER — Telehealth (HOSPITAL_COMMUNITY): Payer: Self-pay | Admitting: *Deleted

## 2016-08-28 NOTE — Telephone Encounter (Signed)
ECHOCARDIOGRAM COMPLETE  Order: [id number]  Status:  Final result Visible to patient:  No (Not Released) Dx:  Dilated cardiomyopathy ([place])  Notes recorded by Kennieth Rad, RN on 08/28/2016 at 10:51 AM EDT Called and spoke with patient she is aware of results. No further questions at this time. ------  Notes recorded by Scarlette Calico, RN on 08/27/2016 at 10:19 AM EDT Left message to call back ------  Notes recorded by Larey Dresser, MD on 08/21/2016 at 4:59 PM EDT EF improved to 40-45% .

## 2016-08-29 DIAGNOSIS — O903 Peripartum cardiomyopathy: Secondary | ICD-10-CM | POA: Diagnosis not present

## 2016-08-29 DIAGNOSIS — N183 Chronic kidney disease, stage 3 (moderate): Secondary | ICD-10-CM | POA: Diagnosis not present

## 2016-08-29 DIAGNOSIS — I129 Hypertensive chronic kidney disease with stage 1 through stage 4 chronic kidney disease, or unspecified chronic kidney disease: Secondary | ICD-10-CM | POA: Diagnosis not present

## 2016-08-29 DIAGNOSIS — I4891 Unspecified atrial fibrillation: Secondary | ICD-10-CM | POA: Diagnosis not present

## 2016-08-29 DIAGNOSIS — Z6841 Body Mass Index (BMI) 40.0 and over, adult: Secondary | ICD-10-CM | POA: Diagnosis not present

## 2016-09-08 ENCOUNTER — Ambulatory Visit (INDEPENDENT_AMBULATORY_CARE_PROVIDER_SITE_OTHER): Payer: Medicare Other

## 2016-09-08 ENCOUNTER — Telehealth: Payer: Self-pay

## 2016-09-08 DIAGNOSIS — I5022 Chronic systolic (congestive) heart failure: Secondary | ICD-10-CM | POA: Diagnosis not present

## 2016-09-08 DIAGNOSIS — Z95810 Presence of automatic (implantable) cardiac defibrillator: Secondary | ICD-10-CM

## 2016-09-08 NOTE — Telephone Encounter (Signed)
Remote ICM transmission received.  Attempted patient call and is not a working number.

## 2016-09-08 NOTE — Progress Notes (Signed)
EPIC Encounter for ICM Monitoring  Patient Name: Erin Jarvis is a 51 y.o. female Date: 09/08/2016 Primary Care Physican: [name], MD Primary Cardiologist:Hochrein/McLean Electrophysiologist: Allred Dry Weight:Unknown      Attempted call to patient and unable to reach.    Transmission reviewed.    Thoracic impedance normal.  Prescribed dosage Torsemide 20 mg 2 tablets (40 mg total) daily  Labs: 05/14/2016 Creatinine 1.60, BUN 22, Potassium 3.8, Sodium 140, EGFR 36-41 05/02/2016 Creatinine 1.98, BUN 54, Potassium 3.9, Sodium 139, EGFR 28-33 04/22/2016 Creatinine 2.22, BUN 81, Potassium 4.4, Sodium 136, EGFR 24-28 01/15/2018Creatinine 2.13, BUN 45, Potassium 4.5, Sodium 135, EGFR 25-29  01/14/2018Creatinine 1.86, BUN 37, Potassium 4.4, Sodium 133, EGFR 30-35  01/13/2018Creatinine 1.70, BUN 31, Potassium 4.3, Sodium 137, EGFR 33-39  01/12/2018Creatinine 1.62, BUN 30, Potassium 4.2, Sodium 138, EGFR 35-41  01/11/2018Creatinine 1.59, BUN 40, Potassium 3.4, Sodium 137, EGFR 36-42  01/10/2018Creatinine 1.90, BUN 51, Potassium 4.4, Sodium 137, EGFR 29-34  01/09/2018Creatinine 1.75, BUN 50, Potassium 4.2, Sodium 137, EGFR 32-37   Recommendations:  NONE - Unable to reach patient   Follow-up plan: ICM clinic phone appointment on 10/13/2016.  Office appointment scheduled on 09/19/2016 with HF Clinic.  Copy of ICM check sent to device physician.   3 month ICM trend: 09/08/2016   1 Year ICM trend:      [name], RN 09/08/2016 10:55 AM

## 2016-09-12 ENCOUNTER — Telehealth (HOSPITAL_COMMUNITY): Payer: Self-pay | Admitting: *Deleted

## 2016-09-12 NOTE — Telephone Encounter (Signed)
[employer] called saying that patient's entresto had been approved but have been unable to reach patient.  I called and spoke with patient and she hadn't called them back because her kidney doctor wanted to talk to Dr. Aundra Dubin about his concerns with patient taking Entresto and Spironolactone.  She stated since she only has one kidney she didn't want to start it until he had talked to Dr. Aundra Dubin.    I told her that I would send message to Dr. Aundra Dubin and if he advises her to go ahead and start entresto 24-26 mg I will let her know and she can call [employer] back @ [phone].

## 2016-09-14 NOTE — Telephone Encounter (Signed)
She tolerated lisinopril with stable creatinine and should be no different with Entresto.  She should start the [facility] and get a BMET 10 days after starting to make sure that renal function is stable.

## 2016-09-15 DIAGNOSIS — Z6841 Body Mass Index (BMI) 40.0 and over, adult: Secondary | ICD-10-CM | POA: Diagnosis not present

## 2016-09-15 DIAGNOSIS — R948 Abnormal results of function studies of other organs and systems: Secondary | ICD-10-CM | POA: Diagnosis not present

## 2016-09-15 DIAGNOSIS — Z713 Dietary counseling and surveillance: Secondary | ICD-10-CM | POA: Diagnosis not present

## 2016-09-15 NOTE — Telephone Encounter (Signed)
Called patient this morning but had to leave VM asking for her to call us back.

## 2016-09-19 ENCOUNTER — Ambulatory Visit (HOSPITAL_COMMUNITY)
Admission: RE | Admit: 2016-09-19 | Discharge: 2016-09-19 | Disposition: A | Discharge status: Home / Self Care | Payer: Medicare Other | Source: Ambulatory Visit | Attending: Cardiology | Admitting: Cardiology

## 2016-09-19 VITALS — BP 122/78 | HR 73 | Wt 279.0 lb

## 2016-09-19 DIAGNOSIS — I429 Cardiomyopathy, unspecified: Secondary | ICD-10-CM | POA: Insufficient documentation

## 2016-09-19 DIAGNOSIS — M109 Gout, unspecified: Secondary | ICD-10-CM | POA: Diagnosis not present

## 2016-09-19 DIAGNOSIS — N183 Chronic kidney disease, stage 3 unspecified: Secondary | ICD-10-CM

## 2016-09-19 DIAGNOSIS — G4733 Obstructive sleep apnea (adult) (pediatric): Secondary | ICD-10-CM | POA: Insufficient documentation

## 2016-09-19 DIAGNOSIS — I472 Ventricular tachycardia, unspecified: Secondary | ICD-10-CM

## 2016-09-19 DIAGNOSIS — E1122 Type 2 diabetes mellitus with diabetic chronic kidney disease: Secondary | ICD-10-CM | POA: Insufficient documentation

## 2016-09-19 DIAGNOSIS — I481 Persistent atrial fibrillation: Secondary | ICD-10-CM | POA: Diagnosis not present

## 2016-09-19 DIAGNOSIS — I4891 Unspecified atrial fibrillation: Secondary | ICD-10-CM | POA: Diagnosis present

## 2016-09-19 DIAGNOSIS — I5022 Chronic systolic (congestive) heart failure: Secondary | ICD-10-CM | POA: Insufficient documentation

## 2016-09-19 DIAGNOSIS — I480 Paroxysmal atrial fibrillation: Secondary | ICD-10-CM | POA: Diagnosis not present

## 2016-09-19 DIAGNOSIS — E039 Hypothyroidism, unspecified: Secondary | ICD-10-CM | POA: Diagnosis not present

## 2016-09-19 DIAGNOSIS — I420 Dilated cardiomyopathy: Secondary | ICD-10-CM

## 2016-09-19 DIAGNOSIS — I130 Hypertensive heart and chronic kidney disease with heart failure and stage 1 through stage 4 chronic kidney disease, or unspecified chronic kidney disease: Secondary | ICD-10-CM | POA: Insufficient documentation

## 2016-09-19 LAB — BASIC METABOLIC PANEL
Anion gap: 13 (ref 5–15)
BUN: 37 mg/dL — ABNORMAL HIGH (ref 6–20)
CHLORIDE: 100 mmol/L — AB (ref 101–111)
CO2: 23 mmol/L (ref 22–32)
CREATININE: 1.71 mg/dL — AB (ref 0.44–1.00)
Calcium: 9.8 mg/dL (ref 8.9–10.3)
GFR calc Af Amer: 38 mL/min — ABNORMAL LOW (ref >60)
GFR calc non Af Amer: 33 mL/min — ABNORMAL LOW (ref >60)
Glucose, Bld: 153 mg/dL — ABNORMAL HIGH (ref 65–99)
Potassium: 4.2 mmol/L (ref 3.5–5.1)
Sodium: 136 mmol/L (ref 135–145)

## 2016-09-19 NOTE — Patient Instructions (Signed)
Routine lab work today. Will notify you of abnormal results, otherwise no news is good news!  Follow up 4 months with Dr. Aundra Dubin. We will call you closer to this time, or you may call our office to schedule 1 month before you are due to be seen. Take all medication as prescribed the day of your appointment. Bring all medications with you to your appointment.  Do the following things EVERYDAY: 1) Weigh yourself in the morning before breakfast. Write it down and keep it in a log. 2) Take your medicines as prescribed 3) Eat low salt foods-Limit salt (sodium) to 2000 mg per day.  4) Stay as active as you can everyday 5) Limit all fluids for the day to less than 2 liters

## 2016-09-19 NOTE — Progress Notes (Signed)
Advanced Heart Failure Medication Review by a Pharmacist  Does the patient  feel that his/her medications are working for him/her?  yes  Has the patient been experiencing any side effects to the medications prescribed?  no  Does the patient measure his/her own blood pressure or blood glucose at home?  yes   Does the patient have any problems obtaining medications due to transportation or finances?   no  Understanding of regimen: good Understanding of indications: good Potential of compliance: good Patient understands to avoid NSAIDs. Patient understands to avoid decongestants.  Issues to address at subsequent visits: none.    Pharmacist comments: Erin Jarvis is a pleasant 51 y/o female who presents without any medication bottles or list but good recollection of her medication regimen. Patient reports taking torsemide 20 mg 2 times a day, and she reports having to wake up several times in the middle of the night to go to the bathroom. Told patient that the prescription was written for torsemide 20 mg 2 tablets once a day, and that she could take both tablets in the morning. Patient had no medication-related questions or concerns at this time.   [name] PharmD Candidate   Time with patient: 10 minutes Preparation and documentation time: 10 minutes Total time: 20 minutes

## 2016-09-19 NOTE — Progress Notes (Signed)
PCP: Dr. Sherrie Sport HF Cardiology: Dr. 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 At one point EF improved to 45% from 20%.  She also has a history VT with Medtronic ICD placed in [place]. In 12/17, she was  admitted to [facility] with new onset atrial fibrillation/RVR and volume overload. Required IV diuresis and cardizem drip.  She remained in atrial fibrillation.  Warfarin was started that admission.   On 03/26/16, she saw Dr Percival Spanish and she was set up TEE/DCCV. Creatinine at that time was 2.03.  She had had a functional decline and dyspnea at rest. Weight at home had been trending up from 290 to 309 pounds.  SBP in 90s at Dr. Rosezella [place] office.   On 04/08/16, she presented for scheduled TEE/DC-CV, however thrombus was noted in LA appendage so DC-CV was not pursued. TEE also showed severe LV dysfunction. Dyspneic at rest. SBP soft.  She was admitted and ultimately required initiation of milrinone gtt + diuresis with IV Lasix and metolazone.  She lost > 20 lbs.  We were able to titrate her off milrinone and she was sent home on torsemide.  Rate was difficult to control, Toprol XL was titrated up to 75 mg bid.   At appointment in 2/18, she was noted to be back in NSR.   She returns today for HF follow up. She says she feels great. No SOB with walking into clinic, she is walking at the gym a few days a week without SOB. Taking all her medications. She is pursuing bariatric surgery so she has been given a strict diet to follow from the [facility] bariatric surgery center. Eating mostly low salt foods, drinking about 2400 ml a day. Weights at home 280-283 pounds. Weight stable from last visit. Taking all medications. She was recently started on Entresto and says that she does feel like she has more energy.   Labs (5/18): K 3.9, creatinine 1.85 Labs (1/18): digoxin 0.5, K 4.5, creatinine 2.13,  digoxin 1.7, AST 53, ALT 34, TSH elevated Labs (2/18): K 3.9, creatinine 1.98 => 1.6, TSH 14, normal free T3 and free T4, LFTs normal, digoxin 1.7 Labs (3/18): K 3.8, creatinine 1.8, digoxin 1.0   Optivol: Fluid status elevated over the past 4-5 days. Activity level 4 hours a day. No VT/VF or AF.   PMH: 1. Chronic systolic CHF: Nonischemic cardiomyopathy.  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  EF initially 20%, rose to 45% at one point.   - h/o VT => Medtronic ICD.  - Echo (1/18): EF 25-30%, mildly dilated RV with normal systolic function, moderate TR, PASP 31 mmHg.  - LHC/RHC (1/18): No CAD; mean RA 19, PA 46/20 mean 35, mean PCWP 24, CI 2.37 (Thermo), CI 3.16 (Fick), PVR 1.9 WU.  2. Atrial fibrillation: Persistent.  Diagnosed 12/17 when she presented to [facility] with afib/RVR.  TEE/DCCV set up in 1/18 but no DCCV due to LA thrombus on TEE.   3. Gout 4. Type II diabetes 5. HTN 6. OSA: Uses CPAP 7. CKD stage III: Has history of atrophic left kidney  8. Subclinical hypothyroidism 9. VT: 1/18, had ICD discharge.   SH: Divorced, on disability, lives with parents in [place], nonsmoker, no ETOH, no drugs.   Family History  Problem Relation Age of Onset  . Other Mother        has chronic  pain syndrome from DDD with spinal cord stimulator  . Hypertension Mother   . COPD Father   . Arthritis/Rheumatoid Father    ROS: All systems reviewed and negative except as per HPI.   Current Outpatient Prescriptions  Medication Sig Dispense Refill  . amiodarone (PACERONE) 200 MG tablet Take 1 tablet (200 mg total) by mouth daily. 30 tablet 6  . apixaban (ELIQUIS) 5 MG TABS tablet Take 1 tablet (5 mg total) by mouth 2 (two) times daily. 180 tablet 3  . Digoxin 62.5 MCG TABS Take 0.0625 mg by mouth every other day. 45 tablet 3  . levothyroxine (SYNTHROID, LEVOTHROID) 100 MCG tablet Take 100 mcg by mouth daily before breakfast.    . metFORMIN (GLUCOPHAGE) 500 MG tablet  Take by mouth 2 (two) times daily with a meal.    . metoprolol succinate (TOPROL-XL) 100 MG 24 hr tablet Take 1 tablet (100 mg total) by mouth 2 (two) times daily. 60 tablet 3  . Multiple Vitamin (MULTIVITAMIN) tablet Take 1 tablet by mouth daily.    . sacubitril-valsartan (ENTRESTO) 24-26 MG Take 1 tablet by mouth 2 (two) times daily. 60 tablet 11  . spironolactone (ALDACTONE) 25 MG tablet Take 1 tablet (25 mg total) by mouth daily. 30 tablet 6  . torsemide (DEMADEX) 20 MG tablet Take 2 tablets (40 mg total) by mouth daily. 180 tablet 3   No current facility-administered medications for this encounter.    BP 122/78   Pulse 73   Wt 279 lb (126.6 kg)   LMP 03/14/2014 Comment: neg preg test  SpO2 98%   BMI 46.43 kg/m  General: Obese female, NAD.  Neck: Supple, hard to assess JVP, does not appear elevated. No thyromegaly or thyroid nodule.  Lungs: Clear bilaterally. Normal effort.  CV: PMI nondisplaced. Heart rate regular, S1 and S2. no S3/S4, no murmur.  No edema.  No carotid bruit.  Normal pedal pulses. Trace pedal edema.  Abdomen: Obese, soft, nontender, no hepatosplenomegaly, no distention.  Skin: Intact without lesions or rashes.  Neurologic: Alert and oriented x 3.  Psych: Normal affect. Extremities: No clubbing or cyanosis.  HEENT: Normal.   Filed Weights   09/19/16 0906  Weight: 279 lb (126.6 kg)    Assessment/Plan: 1. Chronic systolic CHF: Echo 7/42 with EF 25-30%, nonischemic cardiomyopathy. Echo 07/2016 with improved EF 40-45%. Cardiomyopathy known since 70. Possibly peri-partum. SPEP negative.  Has Medtronic ICD. LHC (1/18) with no coronary disease.  She was admitted in 1/18 with extensive diuresis, required milrinone due to low output but was able to titrate off.  When RHC was done, cardiac output was ok off milrinone.  - NYHA II  - EF improved on recent Echo to 45% - Volume status stable on exam.  - Continue torsemide 40mg  daily - Continue Entresto 24/26mg  BID.  -  Continue Toprol Xl 100mg  daily.  - Continue Spiro 25mg  daily - Can stop digoxin.  2. Atrial flutter/fibrillation:  Noted in 12/17.  TEE in 1/18 showed LA appendage thrombus.  - Remains in NSR.  - Continue Amiodarone 200 mg daily. She was reminded today to get a regular eye exam.  - Recent LFT's ok - She has subclinical hypothyroidism, taking Levoxyl. She is followed by her PCP.     - Continue Eliquis. Denies melena, hematochezia. Recent CBC ok.  - She saw Dr. Rayann Heman to discuss atrial fibrillation ablation.  He would like to see weight loss before attempting this.   3. CKD: Stage III.  She  has an atrophic left kidney. - She sees Dr. Hollie Salk. Next appt. In August.  4. H/o VT: Has ICD. - No further VT.  5. OSA - Compliant with CPAP nightly.   Overall stable. BMET today. Follow up in 4 months.   Arbutus Leas, NP-C 09/19/2016

## 2016-09-22 DIAGNOSIS — R7303 Prediabetes: Secondary | ICD-10-CM | POA: Diagnosis not present

## 2016-09-22 DIAGNOSIS — I420 Dilated cardiomyopathy: Secondary | ICD-10-CM | POA: Diagnosis not present

## 2016-09-22 DIAGNOSIS — E039 Hypothyroidism, unspecified: Secondary | ICD-10-CM | POA: Diagnosis not present

## 2016-09-22 DIAGNOSIS — Z6841 Body Mass Index (BMI) 40.0 and over, adult: Secondary | ICD-10-CM | POA: Diagnosis not present

## 2016-09-22 DIAGNOSIS — Z713 Dietary counseling and surveillance: Secondary | ICD-10-CM | POA: Diagnosis not present

## 2016-09-22 DIAGNOSIS — G4733 Obstructive sleep apnea (adult) (pediatric): Secondary | ICD-10-CM | POA: Diagnosis not present

## 2016-09-22 DIAGNOSIS — Z9989 Dependence on other enabling machines and devices: Secondary | ICD-10-CM | POA: Diagnosis not present

## 2016-09-22 DIAGNOSIS — I10 Essential (primary) hypertension: Secondary | ICD-10-CM | POA: Diagnosis not present

## 2016-10-03 DIAGNOSIS — M10061 Idiopathic gout, right knee: Secondary | ICD-10-CM | POA: Diagnosis not present

## 2016-10-03 DIAGNOSIS — E032 Hypothyroidism due to medicaments and other exogenous substances: Secondary | ICD-10-CM | POA: Diagnosis not present

## 2016-10-03 DIAGNOSIS — I10 Essential (primary) hypertension: Secondary | ICD-10-CM | POA: Diagnosis not present

## 2016-10-03 DIAGNOSIS — I481 Persistent atrial fibrillation: Secondary | ICD-10-CM | POA: Diagnosis not present

## 2016-10-03 DIAGNOSIS — I5022 Chronic systolic (congestive) heart failure: Secondary | ICD-10-CM | POA: Diagnosis not present

## 2016-10-09 ENCOUNTER — Other Ambulatory Visit (HOSPITAL_COMMUNITY): Payer: Self-pay | Admitting: Cardiology

## 2016-10-13 ENCOUNTER — Ambulatory Visit (INDEPENDENT_AMBULATORY_CARE_PROVIDER_SITE_OTHER): Payer: Medicare Other | Admitting: *Deleted

## 2016-10-13 ENCOUNTER — Telehealth: Payer: Self-pay

## 2016-10-13 DIAGNOSIS — I428 Other cardiomyopathies: Secondary | ICD-10-CM

## 2016-10-13 DIAGNOSIS — I5022 Chronic systolic (congestive) heart failure: Secondary | ICD-10-CM | POA: Diagnosis not present

## 2016-10-13 DIAGNOSIS — Z95810 Presence of automatic (implantable) cardiac defibrillator: Secondary | ICD-10-CM

## 2016-10-13 NOTE — Progress Notes (Signed)
She needs to cut back on sodium intake and fluid intake.  Increase torsemide to 40 mg bid x 4 days then 40 qam/20 qpm after that.  BMET again in 10 days.

## 2016-10-13 NOTE — Progress Notes (Signed)
EPIC Encounter for ICM Monitoring  Patient Name: MADYSUN THALL is a 51 y.o. female Date: 10/13/2016 Primary Care Physican: [name], MD Primary Cardiologist:Hochrein/McLean Electrophysiologist: Allred Dry Weight:Unknown      Heart Failure questions reviewed, pt asymptomatic.  Patient is on bariatric diet which has been higher in salt and she has increased fluid intake >2 liters day.    Thoracic impedance abnormal suggesting fluid accumulation since 09/20/2016.  Prescribed dosage Torsemide 20 mg 2 tablets (40 mg total) daily  Labs: 09/19/2016 Creatinine 1.71, BUN 37, Potassium 4.2, Sodium 136, EGFR 33-38 08/21/2016 Creatinine 1.85, BUN 45, Potassium 3.9, Sodium 137, EGFR 30-35  06/13/2016 Creatinine 1.68, BUN 43, Potassium 4.4, Sodium 139, EGFR 34-39  05/14/2016 Creatinine 1.60, BUN 22, Potassium 3.8, Sodium 140, EGFR 36-41 05/02/2016 Creatinine 1.98, BUN 54, Potassium 3.9, Sodium 139, EGFR 28-33 04/22/2016 Creatinine 2.22, BUN 81, Potassium 4.4, Sodium 136, EGFR 24-28 01/15/2018Creatinine 2.13, BUN 45, Potassium 4.5, Sodium 135, EGFR 25-29  01/14/2018Creatinine 1.86, BUN 37, Potassium 4.4, Sodium 133, EGFR 30-35  01/13/2018Creatinine 1.70, BUN 31, Potassium 4.3, Sodium 137, EGFR 33-39  01/12/2018Creatinine 1.62, BUN 30, Potassium 4.2, Sodium 138, EGFR 35-41  01/11/2018Creatinine 1.59, BUN 40, Potassium 3.4, Sodium 137, EGFR 36-42  01/10/2018Creatinine 1.90, BUN 51, Potassium 4.4, Sodium 137, EGFR 29-34  01/09/2018Creatinine 1.75, BUN 50, Potassium 4.2, Sodium 137, EGFR 32-37   Recommendations: Copy of ICM check sent to Dr Aundra Dubin and Dr Rayann Heman for review and if any recommendations will call back.   Follow-up plan: ICM clinic phone appointment on 10/17/2016 to recheck fluid levels.  Office appointment scheduled 01/02/2017 with Dr. Rayann Heman.   3 month ICM trend: 10/13/2016   1 Year ICM trend:      [name], RN 10/13/2016 11:54 AM

## 2016-10-13 NOTE — Telephone Encounter (Signed)
Remote ICM transmission received.  Attempted patient call and left message to return call.   

## 2016-10-14 LAB — CUP PACEART REMOTE DEVICE CHECK
Brady Statistic RV Percent Paced: 0.03 %
Date Time Interrogation Session: 20180716133529
HighPow Impedance: 61 Ohm
HighPow Impedance: 78 Ohm
Implantable Lead Location: 753860
Implantable Lead Model: 6947
Implantable Pulse Generator Implant Date: 20100824
Lead Channel Pacing Threshold Amplitude: 1.5 V
Lead Channel Pacing Threshold Pulse Width: 0.4 ms
Lead Channel Setting Pacing Amplitude: 3.75 V
MDC IDC LEAD IMPLANT DT: 20100824
MDC IDC MSMT BATTERY VOLTAGE: 2.78 V
MDC IDC MSMT LEADCHNL RV IMPEDANCE VALUE: 627 Ohm
MDC IDC MSMT LEADCHNL RV SENSING INTR AMPL: 3.75 mV
MDC IDC MSMT LEADCHNL RV SENSING INTR AMPL: 3.75 mV
MDC IDC SET LEADCHNL RV PACING PULSEWIDTH: 0.4 ms
MDC IDC SET LEADCHNL RV SENSING SENSITIVITY: 0.3 mV

## 2016-10-14 MED ORDER — TORSEMIDE 20 MG PO TABS: ORAL_TABLET | ORAL | 3 refills | Status: DC

## 2016-10-14 NOTE — Progress Notes (Addendum)
Patient returned call.  Provided Dr Claris Gladden recommendation to increase Torsemide to 40 mg twice a day x 4 days and then Torsemide 40 mg every AM and 20 mg every PM.  She agreed to have BMET drawn 10/23/2016 at [facility] out patient lab.  Advised to decrease sodium and fluid intake.  She has been eating foods high in protein like jerky.  Advised her to work with the Bariatric nutritionist to find foods that are lower sodium and how to manage her fluid intake.  She has an appointment with the nutritionist in the next week.  Medication list updated for change in Torsemide and patient has supply on hand

## 2016-10-14 NOTE — Progress Notes (Signed)
Attempted call to patient to advise of Dr Claris Gladden recommendations.

## 2016-10-14 NOTE — Addendum Note (Signed)
Addended by: [name] on: 10/14/2016 03:03 PM   Modules accepted: Orders

## 2016-10-14 NOTE — Addendum Note (Signed)
Addended by: [name] on: 10/14/2016 04:10 PM   Modules accepted: Orders

## 2016-10-15 ENCOUNTER — Encounter: Payer: Self-pay | Admitting: Cardiology

## 2016-10-16 DIAGNOSIS — F419 Anxiety disorder, unspecified: Secondary | ICD-10-CM | POA: Diagnosis not present

## 2016-10-16 DIAGNOSIS — F329 Major depressive disorder, single episode, unspecified: Secondary | ICD-10-CM | POA: Diagnosis not present

## 2016-10-16 DIAGNOSIS — F3289 Other specified depressive episodes: Secondary | ICD-10-CM | POA: Diagnosis not present

## 2016-10-16 DIAGNOSIS — Z6841 Body Mass Index (BMI) 40.0 and over, adult: Secondary | ICD-10-CM | POA: Diagnosis not present

## 2016-10-16 DIAGNOSIS — I10 Essential (primary) hypertension: Secondary | ICD-10-CM | POA: Diagnosis not present

## 2016-10-20 ENCOUNTER — Telehealth: Payer: Self-pay

## 2016-10-20 NOTE — Telephone Encounter (Signed)
Returned call to patient as requested by voice mail.  Patient stated PCP has prescribed sodium bicarbonate to take.  She said the PCP said since her creatinine was high she would need to take the medication.  She stated she does not understand how this will help and afraid the medication may cause problems with her heart and kidneys.  She has left a message for Dr Aundra Dubin to get recommendation if she should take the medication. Advised she can call her pharmacist as well but Dr Claris Gladden office will be able to advise her.  I am not sure why the medication was prescribed.  No further questions and she will wait for call back from HF clinic.

## 2016-10-23 ENCOUNTER — Other Ambulatory Visit (HOSPITAL_COMMUNITY)
Admission: RE | Admit: 2016-10-23 | Discharge: 2016-10-23 | Disposition: A | Discharge status: Home / Self Care | Payer: Medicare Other | Source: Ambulatory Visit | Attending: Cardiology | Admitting: Cardiology

## 2016-10-23 ENCOUNTER — Ambulatory Visit (INDEPENDENT_AMBULATORY_CARE_PROVIDER_SITE_OTHER): Payer: Self-pay

## 2016-10-23 DIAGNOSIS — Z95810 Presence of automatic (implantable) cardiac defibrillator: Secondary | ICD-10-CM | POA: Diagnosis not present

## 2016-10-23 DIAGNOSIS — I5022 Chronic systolic (congestive) heart failure: Secondary | ICD-10-CM | POA: Insufficient documentation

## 2016-10-23 DIAGNOSIS — I428 Other cardiomyopathies: Secondary | ICD-10-CM | POA: Diagnosis not present

## 2016-10-23 LAB — BASIC METABOLIC PANEL
Anion gap: 13 (ref 5–15)
BUN: 46 mg/dL — ABNORMAL HIGH (ref 6–20)
CHLORIDE: 100 mmol/L — AB (ref 101–111)
CO2: 24 mmol/L (ref 22–32)
Calcium: 9.7 mg/dL (ref 8.9–10.3)
Creatinine, Ser: 1.66 mg/dL — ABNORMAL HIGH (ref 0.44–1.00)
GFR calc Af Amer: 40 mL/min — ABNORMAL LOW (ref >60)
GFR calc non Af Amer: 34 mL/min — ABNORMAL LOW (ref >60)
Glucose, Bld: 193 mg/dL — ABNORMAL HIGH (ref 65–99)
POTASSIUM: 3.6 mmol/L (ref 3.5–5.1)
SODIUM: 137 mmol/L (ref 135–145)

## 2016-10-23 NOTE — Progress Notes (Signed)
EPIC Encounter for ICM Monitoring  Patient Name: Erin Jarvis is a 51 y.o. female Date: 10/23/2016 Primary Care Physican: [name], MD Primary Cardiologist:Hochrein/McLean Electrophysiologist: Allred Dry Weight:Unknown         HF questions reviewed and pt asymptomatic   Thoracic impedance returned normal after taking increase Torsemide x 4 days from 7/16 to 10/17/2016 as recommended by Dr Aundra Dubin  Prescribed dosage: Torsemide 40 mg every AM and 20 mg every PM  Labs: 10/23/2016 Creatinine 1.66, BUN 46, Potassium 3.6, Sodium 137, EGFR 34-40 09/19/2016 Creatinine 1.71, BUN 37, Potassium 4.2, Sodium 136, EGFR 33-38 08/21/2016 Creatinine 1.85, BUN 45, Potassium 3.9, Sodium 137, EGFR 30-35  06/13/2016 Creatinine 1.68, BUN 43, Potassium 4.4, Sodium 139, EGFR 34-39  05/14/2016 Creatinine 1.60, BUN 22, Potassium 3.8, Sodium 140, EGFR 36-41 05/02/2016 Creatinine 1.98, BUN 54, Potassium 3.9, Sodium 139, EGFR 28-33 04/22/2016 Creatinine 2.22, BUN 81, Potassium 4.4, Sodium 136, EGFR 24-28 01/15/2018Creatinine 2.13, BUN 45, Potassium 4.5, Sodium 135, EGFR 25-29  01/14/2018Creatinine 1.86, BUN 37, Potassium 4.4, Sodium 133, EGFR 30-35  01/13/2018Creatinine 1.70, BUN 31, Potassium 4.3, Sodium 137, EGFR 33-39  01/12/2018Creatinine 1.62, BUN 30, Potassium 4.2, Sodium 138, EGFR 35-41  01/11/2018Creatinine 1.59, BUN 40, Potassium 3.4, Sodium 137, EGFR 36-42  01/10/2018Creatinine 1.90, BUN 51, Potassium 4.4, Sodium 137, EGFR 29-34  01/09/2018Creatinine 1.75, BUN 50, Potassium 4.2, Sodium 137, EGFR 32-37   Recommendations: No changes.    Follow-up plan: ICM clinic phone appointment on 11/14/2016.  Office appointment scheduled with Dr Rayann Heman 01/02/2017.  Copy of ICM check sent to device physician.   3 month ICM trend: 10/23/2016   1 Year ICM trend:      [name], RN 10/23/2016 12:44 PM

## 2016-10-28 ENCOUNTER — Other Ambulatory Visit: Payer: Self-pay

## 2016-11-13 DIAGNOSIS — F3289 Other specified depressive episodes: Secondary | ICD-10-CM | POA: Diagnosis not present

## 2016-11-13 DIAGNOSIS — E119 Type 2 diabetes mellitus without complications: Secondary | ICD-10-CM | POA: Diagnosis not present

## 2016-11-13 DIAGNOSIS — R635 Abnormal weight gain: Secondary | ICD-10-CM | POA: Diagnosis not present

## 2016-11-13 DIAGNOSIS — Z6841 Body Mass Index (BMI) 40.0 and over, adult: Secondary | ICD-10-CM | POA: Diagnosis not present

## 2016-11-13 DIAGNOSIS — I10 Essential (primary) hypertension: Secondary | ICD-10-CM | POA: Diagnosis not present

## 2016-11-14 ENCOUNTER — Ambulatory Visit (INDEPENDENT_AMBULATORY_CARE_PROVIDER_SITE_OTHER): Payer: Medicare Other

## 2016-11-14 ENCOUNTER — Telehealth: Payer: Self-pay

## 2016-11-14 DIAGNOSIS — Z95810 Presence of automatic (implantable) cardiac defibrillator: Secondary | ICD-10-CM | POA: Diagnosis not present

## 2016-11-14 DIAGNOSIS — I5022 Chronic systolic (congestive) heart failure: Secondary | ICD-10-CM

## 2016-11-14 NOTE — Progress Notes (Signed)
EPIC Encounter for ICM Monitoring  Patient Name: Erin Jarvis is a 51 y.o. female Date: 11/14/2016 Primary Care Physican: [name], MD Primary Cardiologist:Hochrein/McLean Electrophysiologist: Allred Dry Weight:277 lbs      Heart Failure questions reviewed, pt symptomatic but has weight gain 5 lbs in the last month. Previous weight was 272 lbs.   Denies shortness of breath or extremity swelling.  Following bariatric diet and eating high sodium foods such as cheese.  Drinking > 2 l/day because of dry mouth.  Having trouble making diet changes.    Thoracic impedance abnormal suggesting fluid accumulation since 10/21/2016.  Prescribed dosage: Torsemide 40 mg every AM and 20 mg every PM  Labs: 10/23/2016 Creatinine 1.66, BUN 46, Potassium 3.6, Sodium 137, EGFR 34-40 09/19/2016 Creatinine 1.71, BUN 37, Potassium 4.2, Sodium 136, EGFR 33-38 08/21/2016 Creatinine 1.85, BUN 45, Potassium 3.9, Sodium 137, EGFR 30-35  06/13/2016 Creatinine 1.68, BUN 43, Potassium 4.4, Sodium 139, EGFR 34-39  05/14/2016 Creatinine 1.60, BUN 22, Potassium 3.8, Sodium 140, EGFR 36-41 05/02/2016 Creatinine 1.98, BUN 54, Potassium 3.9, Sodium 139, EGFR 28-33 04/22/2016 Creatinine 2.22, BUN 81, Potassium 4.4, Sodium 136, EGFR 24-28 01/15/2018Creatinine 2.13, BUN 45, Potassium 4.5, Sodium 135, EGFR 25-29  01/14/2018Creatinine 1.86, BUN 37, Potassium 4.4, Sodium 133, EGFR 30-35  01/13/2018Creatinine 1.70, BUN 31, Potassium 4.3, Sodium 137, EGFR 33-39  01/12/2018Creatinine 1.62, BUN 30, Potassium 4.2, Sodium 138, EGFR 35-41  01/11/2018Creatinine 1.59, BUN 40, Potassium 3.4, Sodium 137, EGFR 36-42  01/10/2018Creatinine 1.90, BUN 51, Potassium 4.4, Sodium 137, EGFR 29-34  01/09/2018Creatinine 1.75, BUN 50, Potassium 4.2, Sodium 137, EGFR 32-37   Recommendations:  Advised tosubstitution fruit/vegetables for high salty foods and limit fluid intake to 2 liters a day.  Explained increase risk of  hospitalization when there is fluid accumulation.  Advised if she has fluid symptoms over the weekend to go to ER.    Copy of ICM check sent to Dr Aundra Dubin and Dr Rayann Heman for review and will call back if any recommendations.   Follow-up plan: ICM clinic phone appointment on 11/20/2016 (manual send).  Office appointment scheduled 01/02/2017 with Dr. Rayann Heman.    3 month ICM trend: 11/14/2016   1 Year ICM trend:      [name], RN 11/14/2016 10:34 AM

## 2016-11-14 NOTE — Telephone Encounter (Signed)
Remote ICM transmission received.  Attempted patient call and left message to return call.   

## 2016-11-16 NOTE — Progress Notes (Signed)
Take torsemide 60 qam/40 qpm x 3 days then 40 mg bid after that.  BMET 10 days.

## 2016-11-17 MED ORDER — TORSEMIDE 20 MG PO TABS: ORAL_TABLET | ORAL | 3 refills | Status: DC

## 2016-11-17 NOTE — Progress Notes (Signed)
Spoke with patient.  Advised Dr Aundra Dubin recommended to increase Torsemide to 60 mg am and 40 mg pm x 3 days and after 3rd day then long term dosage will be 40 mg twice a day.   She verbalized understanding of medication change. She requested script for Torsemide as she does not have enough supply on hand.   Advised BMET in 10 days and she will have drawn at [facility] on 11/27/2016.  She has made diet change to lower sodium foods and limiting fluid intake. Next ICM remote transmission 11/21/2016 to recheck fluid levels

## 2016-11-18 ENCOUNTER — Telehealth (HOSPITAL_COMMUNITY): Payer: Self-pay | Admitting: Pharmacist

## 2016-11-18 NOTE — Telephone Encounter (Signed)
Patient called stating that [facility] patient assistance pharmacy is stating that it is still processing her Eliquis refill but it should have been sent out yesterday. She will be out of Eliquis tomorrow so I will leave her some samples in case she does not receive the refill in time.   Medication Samples have been provided to the patient.  Drug name: Eliquis       Strength: 5 mg        Qty: 28  LOT: [id number]  Exp.Date: 10/20  Dosing instructions: Take 1 tablet twice daily   The patient has been instructed regarding the correct time, dose, and frequency of taking this medication, including desired effects and most common side effects.   Erin Jarvis 1:56 PM 11/18/2016

## 2016-11-21 ENCOUNTER — Telehealth: Payer: Self-pay

## 2016-11-21 ENCOUNTER — Ambulatory Visit (INDEPENDENT_AMBULATORY_CARE_PROVIDER_SITE_OTHER): Payer: Self-pay

## 2016-11-21 DIAGNOSIS — Z95810 Presence of automatic (implantable) cardiac defibrillator: Secondary | ICD-10-CM

## 2016-11-21 DIAGNOSIS — I5022 Chronic systolic (congestive) heart failure: Secondary | ICD-10-CM

## 2016-11-21 NOTE — Telephone Encounter (Signed)
Remote ICM transmission received.  Attempted patient call and left message to return call.   

## 2016-11-21 NOTE — Progress Notes (Signed)
EPIC Encounter for ICM Monitoring  Patient Name: Erin Jarvis is a 51 y.o. female Date: 11/21/2016 Primary Care Physican: [name], MD Primary Cardiologist:Hochrein/McLean Electrophysiologist: Allred Dry Weight:271 lbs      Heart Failure questions reviewed, pt is asymptomatic at this time.  Weight loss of 6 lbs since medication increase on 11/14/16 and has more energy.  She continue Bariatric diet and attempting to do better at limiting salt.    Thoracic impedance improved but still not back to baseline after taking Torsemide to 60 mg am and 40 mg pm x 3 days and 40 mg twice a day after 3rd day.  Prescribed dosage: Torsemide 20 mg 2 tablets (82m total) two times a day.  Labs: 10/23/2016 Creatinine 1.66, BUN 46, Potassium 3.6, Sodium 137, EGFR 34-40 09/19/2016 Creatinine 1.71, BUN 37, Potassium 4.2, Sodium 136, EGFR 33-38 08/21/2016 Creatinine 1.85, BUN 45, Potassium 3.9, Sodium 137, EGFR 30-35  06/13/2016 Creatinine 1.68, BUN 43, Potassium 4.4, Sodium 139, EGFR 34-39  05/14/2016 Creatinine 1.60, BUN 22, Potassium 3.8, Sodium 140, EGFR 36-41 05/02/2016 Creatinine 1.98, BUN 54, Potassium 3.9, Sodium 139, EGFR 28-33 04/22/2016 Creatinine 2.22, BUN 81, Potassium 4.4, Sodium 136, EGFR 24-28 01/15/2018Creatinine 2.13, BUN 45, Potassium 4.5, Sodium 135, EGFR 25-29  01/14/2018Creatinine 1.86, BUN 37, Potassium 4.4, Sodium 133, EGFR 30-35  01/13/2018Creatinine 1.70, BUN 31, Potassium 4.3, Sodium 137, EGFR 33-39  01/12/2018Creatinine 1.62, BUN 30, Potassium 4.2, Sodium 138, EGFR 35-41  01/11/2018Creatinine 1.59, BUN 40, Potassium 3.4, Sodium 137, EGFR 36-42  01/10/2018Creatinine 1.90, BUN 51, Potassium 4.4, Sodium 137, EGFR 29-34  01/09/2018Creatinine 1.75, BUN 50, Potassium 4.2, Sodium 137, EGFR 32-37   Recommendations:    Reminded her BMET is due 8/30.  Reiterated to limit salt to 2000 mg a day and that she still has some fluid accumulation although it is better.     Follow-up plan: ICM clinic phone appointment on 11/27/2016 to recheck fluid levels (manual send).  Office appointment scheduled 01/02/2017 with Dr. ARayann Heman  Copy of ICM check sent to Dr. MAundra Dubinand Dr. ARayann Hemanfor review and advised if any recommendations will call back.   3 month ICM trend: 11/21/2016   1 Year ICM trend:      LRosalene Billings RN 11/21/2016 9:28 AM

## 2016-11-22 NOTE — Progress Notes (Signed)
Keep torsemide at 60 qam/40 qpm and get BMET 1 week.

## 2016-11-24 MED ORDER — TORSEMIDE 20 MG PO TABS: ORAL_TABLET | ORAL | 3 refills | Status: DC

## 2016-11-24 NOTE — Progress Notes (Signed)
Attempted call to patient and requested return call.

## 2016-11-24 NOTE — Progress Notes (Signed)
Patient returned call.  Advised Dr Aundra Dubin ordered to change Torsemide 20 mg to 3 tablets (60 mg total) every morning and 2 tablets (40 mg total) every evening. Advised to have BMET drawn next week and she will have it drawn at [facility] on 9/5.  Recheck Fluid levels 11/27/2016.  Patient has Torsemide on hand and will call when refill is needed.

## 2016-11-27 ENCOUNTER — Telehealth: Payer: Self-pay

## 2016-11-27 ENCOUNTER — Ambulatory Visit (INDEPENDENT_AMBULATORY_CARE_PROVIDER_SITE_OTHER): Payer: Self-pay

## 2016-11-27 DIAGNOSIS — I5022 Chronic systolic (congestive) heart failure: Secondary | ICD-10-CM

## 2016-11-27 DIAGNOSIS — Z95810 Presence of automatic (implantable) cardiac defibrillator: Secondary | ICD-10-CM

## 2016-11-27 NOTE — Telephone Encounter (Signed)
Remote ICM transmission received.  Attempted call to patient and left message to return call. 

## 2016-11-27 NOTE — Progress Notes (Signed)
EPIC Encounter for ICM Monitoring  Patient Name: Erin Jarvis is a 51 y.o. female Date: 11/27/2016 Primary Care Physican: [name], MD Primary Cardiologist:Hochrein/McLean Electrophysiologist: Allred Dry Weight:270lbs         Patient reported she is feeling better and limiting salt.  No symptoms today.   She follows a Bariatric diet at this time.    Thoracic impedance returned to normal with the increase in Torsemide dosage.   Prescribed dosage: Torsemide 20 mg 3 tablets (67m total) every AM and 2 tablets (40 mg total) every PM.  Labs:  Due to have BMET drawn 12/03/2016 10/23/2016 Creatinine 1.66, BUN 46, Potassium 3.6, Sodium 137, EGFR 34-40 09/19/2016 Creatinine 1.71, BUN 37, Potassium 4.2, Sodium 136, EGFR 33-38 08/21/2016 Creatinine 1.85, BUN 45, Potassium 3.9, Sodium 137, EGFR 30-35  06/13/2016 Creatinine 1.68, BUN 43, Potassium 4.4, Sodium 139, EGFR 34-39  05/14/2016 Creatinine 1.60, BUN 22, Potassium 3.8, Sodium 140, EGFR 36-41 05/02/2016 Creatinine 1.98, BUN 54, Potassium 3.9, Sodium 139, EGFR 28-33 04/22/2016 Creatinine 2.22, BUN 81, Potassium 4.4, Sodium 136, EGFR 24-28 01/15/2018Creatinine 2.13, BUN 45, Potassium 4.5, Sodium 135, EGFR 25-29  01/14/2018Creatinine 1.86, BUN 37, Potassium 4.4, Sodium 133, EGFR 30-35  01/13/2018Creatinine 1.70, BUN 31, Potassium 4.3, Sodium 137, EGFR 33-39  01/12/2018Creatinine 1.62, BUN 30, Potassium 4.2, Sodium 138, EGFR 35-41  01/11/2018Creatinine 1.59, BUN 40, Potassium 3.4, Sodium 137, EGFR 36-42  01/10/2018Creatinine 1.90, BUN 51, Potassium 4.4, Sodium 137, EGFR 29-34   Recommendations: No changes.  Encouraged to call for fluid symptoms.   [facility]  Follow-up plan: ICM clinic phone appointment on 12/15/2016.  Office appointment scheduled 01/02/2017 with Dr. ARayann Heman  Copy of ICM check sent to Dr. ARayann Hemanand Dr. MAundra Dubin   3 month ICM trend: 11/27/2016   1 Year ICM  trend:      LRosalene Billings RN 11/27/2016 4:41 PM

## 2016-12-05 ENCOUNTER — Other Ambulatory Visit (HOSPITAL_COMMUNITY)
Admission: RE | Admit: 2016-12-05 | Discharge: 2016-12-05 | Disposition: A | Discharge status: Home / Self Care | Payer: Medicare Other | Source: Ambulatory Visit | Attending: Cardiology | Admitting: Cardiology

## 2016-12-05 DIAGNOSIS — I5022 Chronic systolic (congestive) heart failure: Secondary | ICD-10-CM

## 2016-12-05 LAB — BASIC METABOLIC PANEL
ANION GAP: 14 (ref 5–15)
BUN: 62 mg/dL — ABNORMAL HIGH (ref 6–20)
CALCIUM: 9.8 mg/dL (ref 8.9–10.3)
CO2: 24 mmol/L (ref 22–32)
CREATININE: 2.2 mg/dL — AB (ref 0.44–1.00)
Chloride: 98 mmol/L — ABNORMAL LOW (ref 101–111)
GFR calc Af Amer: 28 mL/min — ABNORMAL LOW (ref >60)
GFR, EST NON AFRICAN AMERICAN: 24 mL/min — AB (ref >60)
Glucose, Bld: 135 mg/dL — ABNORMAL HIGH (ref 65–99)
Potassium: 4 mmol/L (ref 3.5–5.1)
Sodium: 136 mmol/L (ref 135–145)

## 2016-12-09 ENCOUNTER — Telehealth (HOSPITAL_COMMUNITY): Payer: Self-pay | Admitting: *Deleted

## 2016-12-09 DIAGNOSIS — I5022 Chronic systolic (congestive) heart failure: Secondary | ICD-10-CM

## 2016-12-09 MED ORDER — TORSEMIDE 20 MG PO TABS: 20.0000 mg | ORAL_TABLET | Freq: Every day | ORAL | 3 refills | Status: DC

## 2016-12-09 NOTE — Telephone Encounter (Signed)
-----   Message from Larey Dresser, MD sent at 12/07/2016 10:23 PM EDT ----- Hold torsemide for 2 days, then decrease to 20 mg daily. Repeat BMET 1 week.

## 2016-12-11 ENCOUNTER — Ambulatory Visit (INDEPENDENT_AMBULATORY_CARE_PROVIDER_SITE_OTHER): Payer: Self-pay

## 2016-12-11 ENCOUNTER — Telehealth: Payer: Self-pay

## 2016-12-11 DIAGNOSIS — I5022 Chronic systolic (congestive) heart failure: Secondary | ICD-10-CM

## 2016-12-11 DIAGNOSIS — Z95810 Presence of automatic (implantable) cardiac defibrillator: Secondary | ICD-10-CM

## 2016-12-11 NOTE — Telephone Encounter (Signed)
Received call from patient.  She has gained 4 pounds in last 4 days.  She will send a remote transmission for review.  She said Dr Aundra Dubin decreased Torsemide to 20 mg daily due to most recent lab results, Creatinine had increased. Will review and call her back.

## 2016-12-11 NOTE — Progress Notes (Signed)
EPIC Encounter for ICM Monitoring  Patient Name: Erin Jarvis is a 51 y.o. female Date: 12/11/2016 Primary Care Physican: [name], MD Primary Cardiologist:Hochrein/McLean Electrophysiologist: Allred Dry Weight: 272 lbs (was 268 lbs on 9/7)       Received call from patient stating she has gained 4 pounds in last 5 days. Torsemide has been on hold for past 2 days as instructed by HF clinic and starting Torsemide 20 mg 1 tablet daily today.      Thoracic impedance normal since 12/04/2016.  Prescribed dosage: Torsemide 20 mg 1 tablet daily (decreased on 9/11 due to lab results 9/7)  Labs: 12/05/2016 Creatinine 2.20, BUN 62, Potassium 4.0, Sodium 136, EGFR 24-28 10/23/2016 Creatinine 1.66, BUN 46, Potassium 3.6, Sodium 137, EGFR 34-40 09/19/2016 Creatinine 1.71, BUN 37, Potassium 4.2, Sodium 136, EGFR 33-38 08/21/2016 Creatinine 1.85, BUN 45, Potassium 3.9, Sodium 137, EGFR 30-35  06/13/2016 Creatinine 1.68, BUN 43, Potassium 4.4, Sodium 139, EGFR 34-39  05/14/2016 Creatinine 1.60, BUN 22, Potassium 3.8, Sodium 140, EGFR 36-41 05/02/2016 Creatinine 1.98, BUN 54, Potassium 3.9, Sodium 139, EGFR 28-33 04/22/2016 Creatinine 2.22, BUN 81, Potassium 4.4, Sodium 136, EGFR 24-28 01/15/2018Creatinine 2.13, BUN 45, Potassium 4.5, Sodium 135, EGFR 25-29  01/14/2018Creatinine 1.86, BUN 37, Potassium 4.4, Sodium 133, EGFR 30-35  01/13/2018Creatinine 1.70, BUN 31, Potassium 4.3, Sodium 137, EGFR 33-39  01/12/2018Creatinine 1.62, BUN 30, Potassium 4.2, Sodium 138, EGFR 35-41  01/11/2018Creatinine 1.59, BUN 40, Potassium 3.4, Sodium 137, EGFR 36-42  01/10/2018Creatinine 1.90, BUN 51, Potassium 4.4, Sodium 137, EGFR 29-34   Recommendations: Copy of ICM check sent to Dr. Rayann Heman and Dr. Aundra Dubin for review and if recommendations will call her back.   BMET scheduled on on 9/19  Follow-up plan: ICM clinic phone appointment on 12/16/2016.  Office appointment scheduled 01/02/2017 with Dr.  Rayann Heman.   3 month ICM trend: 12/11/2016   1 Year ICM trend:      [name], RN 12/11/2016 11:20 AM

## 2016-12-15 ENCOUNTER — Telehealth: Payer: Self-pay

## 2016-12-15 ENCOUNTER — Ambulatory Visit (INDEPENDENT_AMBULATORY_CARE_PROVIDER_SITE_OTHER): Payer: Medicare Other

## 2016-12-15 DIAGNOSIS — I5022 Chronic systolic (congestive) heart failure: Secondary | ICD-10-CM | POA: Diagnosis not present

## 2016-12-15 DIAGNOSIS — Z95810 Presence of automatic (implantable) cardiac defibrillator: Secondary | ICD-10-CM

## 2016-12-15 NOTE — Progress Notes (Signed)
EPIC Encounter for ICM Monitoring  Patient Name: Erin Jarvis is a 51 y.o. female Date: 12/15/2016 Primary Care Physican: [name], MD Primary Cardiologist:Hochrein/McLean Electrophysiologist: Allred Dry Weight:  275 lbs today  9/7 268 lbs 9/13 272 lbs.     Heart Failure questions reviewed, pt symptomatic with minimal swelling in hands and 3 lb weight gain in last 3 days.  Total weight gain since 9/7 is 7 lbs.   Thoracic impedance abnormal suggesting fluid accumulation 12/11/2016.  Prescribed dosage: Torsemide 20 mg 1 tablet daily (decreased on 9/11)  Labs: 12/05/2016 Creatinine 2.20, BUN 62, Potassium 4.0, Sodium 136, EGFR 24-28 10/23/2016 Creatinine 1.66, BUN 46, Potassium 3.6, Sodium 137, EGFR 34-40 09/19/2016 Creatinine 1.71, BUN 37, Potassium 4.2, Sodium 136, EGFR 33-38 08/21/2016 Creatinine 1.85, BUN 45, Potassium 3.9, Sodium 137, EGFR 30-35  06/13/2016 Creatinine 1.68, BUN 43, Potassium 4.4, Sodium 139, EGFR 34-39  05/14/2016 Creatinine 1.60, BUN 22, Potassium 3.8, Sodium 140, EGFR 36-41 05/02/2016 Creatinine 1.98, BUN 54, Potassium 3.9, Sodium 139, EGFR 28-33 04/22/2016 Creatinine 2.22, BUN 81, Potassium 4.4, Sodium 136, EGFR 24-28 01/15/2018Creatinine 2.13, BUN 45, Potassium 4.5, Sodium 135, EGFR 25-29  01/14/2018Creatinine 1.86, BUN 37, Potassium 4.4, Sodium 133, EGFR 30-35  01/13/2018Creatinine 1.70, BUN 31, Potassium 4.3, Sodium 137, EGFR 33-39  01/12/2018Creatinine 1.62, BUN 30, Potassium 4.2, Sodium 138, EGFR 35-41  01/11/2018Creatinine 1.59, BUN 40, Potassium 3.4, Sodium 137, EGFR 36-42  01/10/2018Creatinine 1.90, BUN 51, Potassium 4.4, Sodium 137, EGFR 29-34   Recommendations: Copy of ICM check sent to Dr. Aundra Dubin and Dr. Rayann Heman for review and any recommendations if needed.   Follow-up plan: ICM clinic phone appointment on 12/18/2016.  Office appointment scheduled 01/02/2017 with Dr. Rayann Heman.   3 month ICM trend: 12/15/2016   1 Year ICM trend:       [name], RN 12/15/2016 10:52 AM

## 2016-12-15 NOTE — Telephone Encounter (Signed)
Remote ICM transmission received.  Attempted call to patient and left message to return call regarding transmission. 

## 2016-12-18 ENCOUNTER — Ambulatory Visit (INDEPENDENT_AMBULATORY_CARE_PROVIDER_SITE_OTHER): Payer: Self-pay

## 2016-12-18 DIAGNOSIS — I5022 Chronic systolic (congestive) heart failure: Secondary | ICD-10-CM

## 2016-12-18 DIAGNOSIS — Z95810 Presence of automatic (implantable) cardiac defibrillator: Secondary | ICD-10-CM

## 2016-12-18 MED ORDER — TORSEMIDE 20 MG PO TABS: ORAL_TABLET | ORAL | 3 refills | Status: DC

## 2016-12-18 NOTE — Progress Notes (Addendum)
EPIC Encounter for ICM Monitoring  Patient Name: Erin Jarvis is a 51 y.o. female Date: 12/18/2016 Primary Care Physican: [name], MD Primary Cardiologist:Hochrein/McLean Electrophysiologist: Allred Dry Weight:  272lbs today        Heart Failure questions reviewed, pt reported weight loss of 3-4 lbs since she self adjusted Torsemide to 60 mg x last 2 days before Dr Aundra Dubin gave dosage order change today.     Thoracic impedance continues to be abnormal suggesting fluid accumulation since last ICM transmission on 9/17.  Prescribed dosage:  Torsemide 20 mg daily alternating with 40 mg every other day.  Advised of new dosage today that was ordered by Dr Aundra Dubin in response from last ICM transmission on 9/17.    Labs: 12/05/2016 Creatinine 2.20, BUN 62, Potassium 4.0, Sodium 136, EGFR 24-28 10/23/2016 Creatinine 1.66, BUN 46, Potassium 3.6, Sodium 137, EGFR 34-40 09/19/2016 Creatinine 1.71, BUN 37, Potassium 4.2, Sodium 136, EGFR 33-38 08/21/2016 Creatinine 1.85, BUN 45, Potassium 3.9, Sodium 137, EGFR 30-35  06/13/2016 Creatinine 1.68, BUN 43, Potassium 4.4, Sodium 139, EGFR 34-39  05/14/2016 Creatinine 1.60, BUN 22, Potassium 3.8, Sodium 140, EGFR 36-41 05/02/2016 Creatinine 1.98, BUN 54, Potassium 3.9, Sodium 139, EGFR 28-33 04/22/2016 Creatinine 2.22, BUN 81, Potassium 4.4, Sodium 136, EGFR 24-28 01/15/2018Creatinine 2.13, BUN 45, Potassium 4.5, Sodium 135, EGFR 25-29  01/14/2018Creatinine 1.86, BUN 37, Potassium 4.4, Sodium 133, EGFR 30-35  01/13/2018Creatinine 1.70, BUN 31, Potassium 4.3, Sodium 137, EGFR 33-39  01/12/2018Creatinine 1.62, BUN 30, Potassium 4.2, Sodium 138, EGFR 35-41  01/11/2018Creatinine 1.59, BUN 40, Potassium 3.4, Sodium 137, EGFR 36-42  01/10/2018Creatinine 1.90, BUN 51, Potassium 4.4, Sodium 137, EGFR 29-34  Recommendations:  Medication change and she verbalized understanding.    Follow-up plan: ICM clinic phone appointment on 01/15/2017 since  patient will have defib check on 01/02/2017 with Dr Rayann Heman in the office.  BMET to be drawn 12/25/2016.  Copy of ICM check sent to Dr. Rayann Heman.   3 month ICM trend: 12/18/2016   1 Year ICM trend:      [name], RN 12/18/2016 3:08 PM

## 2016-12-18 NOTE — Addendum Note (Signed)
Addended by: [name] on: 12/18/2016 02:58 PM   Modules accepted: Orders

## 2016-12-18 NOTE — Progress Notes (Signed)
Attempted call to patient to provide Dr McLean's recommendations.   

## 2016-12-18 NOTE — Progress Notes (Addendum)
Patient returned call.  Advised Dr. Aundra Dubin ordered Torsemide 20 mg daily alternating with 40 mg daily.  BMET in 1 week, 12/25/2016.  She verbalized understanding.

## 2016-12-18 NOTE — Progress Notes (Signed)
Increase torsemide to 20 daily alternating with 40 daily.  Repeat BMET 1 week.

## 2016-12-23 DIAGNOSIS — F419 Anxiety disorder, unspecified: Secondary | ICD-10-CM | POA: Diagnosis not present

## 2016-12-23 DIAGNOSIS — F329 Major depressive disorder, single episode, unspecified: Secondary | ICD-10-CM | POA: Diagnosis not present

## 2016-12-23 DIAGNOSIS — Z6841 Body Mass Index (BMI) 40.0 and over, adult: Secondary | ICD-10-CM | POA: Diagnosis not present

## 2016-12-23 DIAGNOSIS — F54 Psychological and behavioral factors associated with disorders or diseases classified elsewhere: Secondary | ICD-10-CM | POA: Diagnosis not present

## 2016-12-26 ENCOUNTER — Other Ambulatory Visit (HOSPITAL_COMMUNITY)
Admission: RE | Admit: 2016-12-26 | Discharge: 2016-12-26 | Disposition: A | Discharge status: Home / Self Care | Payer: Medicare Other | Source: Ambulatory Visit | Attending: Cardiology | Admitting: Cardiology

## 2016-12-26 DIAGNOSIS — I10 Essential (primary) hypertension: Secondary | ICD-10-CM | POA: Diagnosis not present

## 2016-12-26 DIAGNOSIS — I5022 Chronic systolic (congestive) heart failure: Secondary | ICD-10-CM

## 2016-12-26 DIAGNOSIS — R635 Abnormal weight gain: Secondary | ICD-10-CM | POA: Diagnosis not present

## 2016-12-26 DIAGNOSIS — Z6841 Body Mass Index (BMI) 40.0 and over, adult: Secondary | ICD-10-CM | POA: Diagnosis not present

## 2016-12-26 DIAGNOSIS — Z9989 Dependence on other enabling machines and devices: Secondary | ICD-10-CM | POA: Diagnosis not present

## 2016-12-26 DIAGNOSIS — R69 Illness, unspecified: Secondary | ICD-10-CM | POA: Diagnosis present

## 2016-12-26 DIAGNOSIS — G4733 Obstructive sleep apnea (adult) (pediatric): Secondary | ICD-10-CM | POA: Diagnosis not present

## 2016-12-26 LAB — BASIC METABOLIC PANEL
ANION GAP: 12 (ref 5–15)
BUN: 45 mg/dL — ABNORMAL HIGH (ref 6–20)
CALCIUM: 9.5 mg/dL (ref 8.9–10.3)
CO2: 28 mmol/L (ref 22–32)
Chloride: 97 mmol/L — ABNORMAL LOW (ref 101–111)
Creatinine, Ser: 1.72 mg/dL — ABNORMAL HIGH (ref 0.44–1.00)
GFR calc non Af Amer: 33 mL/min — ABNORMAL LOW (ref >60)
GFR, EST AFRICAN AMERICAN: 38 mL/min — AB (ref >60)
Glucose, Bld: 130 mg/dL — ABNORMAL HIGH (ref 65–99)
Potassium: 3.3 mmol/L — ABNORMAL LOW (ref 3.5–5.1)
SODIUM: 137 mmol/L (ref 135–145)

## 2016-12-29 ENCOUNTER — Telehealth (HOSPITAL_COMMUNITY): Payer: Self-pay

## 2016-12-29 MED ORDER — POTASSIUM CHLORIDE ER 20 MEQ PO TBCR: 20.0000 meq | EXTENDED_RELEASE_TABLET | Freq: Every day | ORAL | 3 refills | Status: DC

## 2016-12-29 NOTE — Telephone Encounter (Signed)
Notes recorded by Shirley Muscat, RN on 12/29/2016 at 2:46 PM EDT Pt aware of results and med sent to pharmacy (pt not previously on K), started 20 meq daily per MD. Med changes in [facility] ------  Notes recorded by Larey Dresser, MD on 12/26/2016 at 4:06 PM EDT Increase total daily K by 20 mEq.

## 2017-01-01 ENCOUNTER — Other Ambulatory Visit (HOSPITAL_COMMUNITY): Payer: Self-pay | Admitting: Student

## 2017-01-02 ENCOUNTER — Encounter: Payer: Self-pay | Admitting: Internal Medicine

## 2017-01-02 ENCOUNTER — Ambulatory Visit (INDEPENDENT_AMBULATORY_CARE_PROVIDER_SITE_OTHER): Payer: Medicare Other | Admitting: Internal Medicine

## 2017-01-02 VITALS — BP 122/82 | HR 68 | Ht 67.0 in | Wt 275.0 lb

## 2017-01-02 DIAGNOSIS — N183 Chronic kidney disease, stage 3 unspecified: Secondary | ICD-10-CM

## 2017-01-02 DIAGNOSIS — G4733 Obstructive sleep apnea (adult) (pediatric): Secondary | ICD-10-CM | POA: Diagnosis not present

## 2017-01-02 DIAGNOSIS — I5023 Acute on chronic systolic (congestive) heart failure: Secondary | ICD-10-CM | POA: Diagnosis not present

## 2017-01-02 DIAGNOSIS — I472 Ventricular tachycardia, unspecified: Secondary | ICD-10-CM

## 2017-01-02 DIAGNOSIS — I480 Paroxysmal atrial fibrillation: Secondary | ICD-10-CM

## 2017-01-02 DIAGNOSIS — I5022 Chronic systolic (congestive) heart failure: Secondary | ICD-10-CM

## 2017-01-02 NOTE — Progress Notes (Signed)
PCP: [name], MD Primary Cardiologist:  Dr Percival Spanish CHF: Erin Jarvis Primary EP: Dr Erin Jarvis is a 51 y.o. female who presents today for routine electrophysiology followup.  Since last being seen in our clinic, the patient reports doing very well.  She is very engaged with the bariatric team at [facility] and is making very deliberate efforts at weight loss.  I am very excited for her!  She may be looking at bariatric surgery in November.  Today, she denies symptoms of palpitations, chest pain, shortness of breath,  lower extremity edema, dizziness, presyncope, syncope, or ICD shocks.  The patient is otherwise without complaint today.   Past Medical History:  Diagnosis Date  . AICD (automatic cardioverter/defibrillator) present 2002, 2010   secondary to post-partum cardiomyopathy (1996)  . CHF (congestive heart failure) ([place])   . Diabetes mellitus without complication ([place])   . Hypertension   . Hypertensive cardiovascular disease   . Obesity   . Persistent atrial fibrillation ([place])   . Postpartum cardiomyopathy    Last echocardiogram was last year and EF 45 -50 %  . Seasonal allergies   . Sleep apnea    complaint with CPAP  . Ventricular tachycardia (HCC)    s/p ICD Implantation (MDT Secura VR)   Past Surgical History:  Procedure Laterality Date  . CARDIAC CATHETERIZATION N/A 04/11/2016   Procedure: Right/Left Heart Cath and Coronary Angiography;  Surgeon: Larey Dresser, MD;  Location: [facility];  Service: Cardiovascular;  Laterality: N/A;  . CARDIAC DEFIBRILLATOR PLACEMENT  08/12/2000, 11/11/2008   Most recent Generator MDT Secura VR placed in [place]  . CESAREAN SECTION  1996  . CHOLECYSTECTOMY  2004  . IR GENERIC HISTORICAL  06/13/2014   IR RADIOLOGIST EVAL & MGMT 06/13/2014 Greggory Keen, MD [facility] INTER[facility]  . IR GENERIC HISTORICAL  04/09/2016   IR FLUORO GUIDE CV LINE LEFT 04/09/2016 Greggory Keen, MD [facility]  . IR GENERIC HISTORICAL   04/09/2016   IR US GUIDE VASC ACCESS RIGHT 04/09/2016 Greggory Keen, MD [facility]  . IR GENERIC HISTORICAL  04/09/2016   IR VENO/EXT/UNI RIGHT 04/09/2016 Greggory Keen, MD [facility]  . IR GENERIC HISTORICAL  04/09/2016   IR US GUIDE VASC ACCESS LEFT 04/09/2016 Greggory Keen, MD [facility]  . TEE WITHOUT CARDIOVERSION N/A 04/08/2016   Procedure: TRANSESOPHAGEAL ECHOCARDIOGRAM (TEE);  Surgeon: Pixie Casino, MD;  Location: [facility] ENDOSCOPY;  Service: Cardiovascular;  Laterality: N/A;    ROS- all systems are reviewed and negative except as per HPI above  Current Outpatient Prescriptions  Medication Sig Dispense Refill  . amiodarone (PACERONE) 200 MG tablet Take 1 tablet (200 mg total) by mouth daily. 30 tablet 6  . apixaban (ELIQUIS) 5 MG TABS tablet Take 1 tablet (5 mg total) by mouth 2 (two) times daily. 180 tablet 3  . citalopram (CELEXA) 10 MG tablet Take 10 mg by mouth.    . levothyroxine (SYNTHROID, LEVOTHROID) 100 MCG tablet Take 100 mcg by mouth daily before breakfast.    . metFORMIN (GLUCOPHAGE) 500 MG tablet Take by mouth 2 (two) times daily with a meal.    . metoprolol succinate (TOPROL-XL) 100 MG 24 hr tablet Take 1 tablet (100 mg total) by mouth 2 (two) times daily. 60 tablet 3  . Multiple Vitamin (MULTIVITAMIN) tablet Take 1 tablet by mouth daily.    . potassium chloride 20 MEQ TBCR Take 20 mEq by mouth daily. 30 tablet 3  . sacubitril-valsartan (  ENTRESTO) 24-26 MG Take 1 tablet by mouth 2 (two) times daily. 60 tablet 11  . spironolactone (ALDACTONE) 25 MG tablet Take 1 tablet (25 mg total) by mouth daily. 30 tablet 6  . torsemide (DEMADEX) 20 MG tablet Take 1 tablet (20 mg total) daily alternating with 2 tablets (40 mg) every other day. 135 tablet 3   No current facility-administered medications for this visit.     Physical Exam: Vitals:   01/02/17 0908  BP: 122/82  Pulse: 68  SpO2: 96%  Weight: 275 lb (124.7 kg)  Height: 5\' 7"  (1.702 m)    GEN- The patient is well  appearing, alert and oriented x 3 today.   Head- normocephalic, atraumatic Eyes-  Sclera clear, conjunctiva pink Ears- hearing intact Oropharynx- clear Lungs- Clear to ausculation bilaterally, normal work of breathing Chest- ICD pocket is well healed Heart- Regular rate and rhythm, no murmurs, rubs or gallops, PMI not laterally displaced GI- soft, NT, ND, + BS Extremities- no clubbing, cyanosis, or edema  ICD interrogation- reviewed in detail today,  See PACEART report  ekg tracing 08/08/16 is personally reviewed and shows sinus rhythm with narrow QRS  Assessment and Plan:  1.  Acute on chronic systolic dysfunction/ post partum CM optisol elevated today Lungs are clear.  She has mild fluid retention in her belly.  Management is complicated by chronic renal failure, stage IV. I will increase torsemide to 40mg  daily x 4 days then return to her current regimen of 20 alternating with 40 mg. Normal ICD function See Claudia Desanctis Art report No changes today EF has improved by recent echo, though clinically, she still struggles.  I think that she would benefit from Beat HF study.  Will defer until after her bariatric surgery  2. VT No recent VT.  She had VT with CL of 220 msec 04/29/16 with successful 34J shock but no VT since. No changes  3. OSA Stable No change required today  4. Obesity Body mass index is 43.07 kg/m. Lifestyle modification is encouraged I am very pleased with her efforts! She is to be commended.   She has been seen by bariatrics team and will likely have surgery in November.  5. Persistent afib Lifestyle modification encouraged Continue amiodarone for now Given renal failure reluctant to consider tikosyn Could consider weaning amiodarone after her bariatric surgery in 3-4 months  Very complicated patient at risk for decompesation A high level of decision making was required  ICM clinic [facility] Will reach out to Beat HF coordinates Needs soon follow-up with CHF  team and Dr Randa Ngo MD, [facility] 01/02/2017 9:49 AM

## 2017-01-02 NOTE — Patient Instructions (Addendum)
Medication Instructions:   Increase Torsemide to 40mg  daily x 4 days, then resume previous dosing.  Continue all other medications.    Labwork: none  Testing/Procedures: none  Follow-Up: Your physician wants you to follow up in:  1 year.  You will receive a reminder letter in the mail one-two months in advance.  If you don't receive a letter, please call our office to schedule the follow up appointment   Any Other Special Instructions Will Be Listed Below (If Applicable). Remote monitoring is used to monitor your Pacemaker of ICD from home. This monitoring reduces the number of office visits required to check your device to one time per year. It allows [place] to keep an eye on the functioning of your device to ensure it is working properly. You are scheduled for a device check from home on 01-15-2017. You may send your transmission at any time that day. If you have a wireless device, the transmission will be sent automatically. After your physician reviews your transmission, you will receive a postcard with your next transmission date.  If you need a refill on your cardiac medications before your next appointment, please call your pharmacy.

## 2017-01-05 LAB — CUP PACEART INCLINIC DEVICE CHECK
Battery Voltage: 2.73 V
Brady Statistic RV Percent Paced: 0.03 %
HIGH POWER IMPEDANCE MEASURED VALUE: 58 Ohm
HighPow Impedance: 88 Ohm
Implantable Lead Location: 753860
Implantable Lead Model: 6947
Lead Channel Impedance Value: 475 Ohm
Lead Channel Pacing Threshold Amplitude: 1.25 V
Lead Channel Sensing Intrinsic Amplitude: 4 mV
Lead Channel Setting Pacing Amplitude: 4 V
Lead Channel Setting Pacing Pulse Width: 0.4 ms
Lead Channel Setting Sensing Sensitivity: 0.3 mV
MDC IDC LEAD IMPLANT DT: 20100824
MDC IDC MSMT LEADCHNL RV PACING THRESHOLD PULSEWIDTH: 0.4 ms
MDC IDC MSMT LEADCHNL RV SENSING INTR AMPL: 3.375 mV
MDC IDC PG IMPLANT DT: 20100824
MDC IDC SESS DTM: 20181005132717

## 2017-01-07 DIAGNOSIS — Z23 Encounter for immunization: Secondary | ICD-10-CM | POA: Diagnosis not present

## 2017-01-14 DIAGNOSIS — S73191A Other sprain of right hip, initial encounter: Secondary | ICD-10-CM | POA: Diagnosis not present

## 2017-01-14 DIAGNOSIS — S0090XA Unspecified superficial injury of unspecified part of head, initial encounter: Secondary | ICD-10-CM | POA: Diagnosis not present

## 2017-01-14 DIAGNOSIS — S82831A Other fracture of upper and lower end of right fibula, initial encounter for closed fracture: Secondary | ICD-10-CM | POA: Diagnosis not present

## 2017-01-14 DIAGNOSIS — S134XXA Sprain of ligaments of cervical spine, initial encounter: Secondary | ICD-10-CM | POA: Diagnosis not present

## 2017-01-14 DIAGNOSIS — E119 Type 2 diabetes mellitus without complications: Secondary | ICD-10-CM | POA: Diagnosis not present

## 2017-01-14 DIAGNOSIS — S8001XA Contusion of right knee, initial encounter: Secondary | ICD-10-CM | POA: Diagnosis not present

## 2017-01-14 DIAGNOSIS — S0093XA Contusion of unspecified part of head, initial encounter: Secondary | ICD-10-CM | POA: Diagnosis not present

## 2017-01-14 DIAGNOSIS — S1980XA Other specified injuries of unspecified part of neck, initial encounter: Secondary | ICD-10-CM | POA: Diagnosis not present

## 2017-01-14 DIAGNOSIS — I10 Essential (primary) hypertension: Secondary | ICD-10-CM | POA: Diagnosis not present

## 2017-01-14 DIAGNOSIS — S99911A Unspecified injury of right ankle, initial encounter: Secondary | ICD-10-CM | POA: Diagnosis not present

## 2017-01-14 DIAGNOSIS — S3992XA Unspecified injury of lower back, initial encounter: Secondary | ICD-10-CM | POA: Diagnosis not present

## 2017-01-14 DIAGNOSIS — M25551 Pain in right hip: Secondary | ICD-10-CM | POA: Diagnosis not present

## 2017-01-14 DIAGNOSIS — W19XXXA Unspecified fall, initial encounter: Secondary | ICD-10-CM | POA: Diagnosis not present

## 2017-01-14 DIAGNOSIS — S93401A Sprain of unspecified ligament of right ankle, initial encounter: Secondary | ICD-10-CM | POA: Diagnosis not present

## 2017-01-14 DIAGNOSIS — S335XXA Sprain of ligaments of lumbar spine, initial encounter: Secondary | ICD-10-CM | POA: Diagnosis not present

## 2017-01-14 DIAGNOSIS — M545 Low back pain: Secondary | ICD-10-CM | POA: Diagnosis not present

## 2017-01-14 DIAGNOSIS — M79604 Pain in right leg: Secondary | ICD-10-CM | POA: Diagnosis not present

## 2017-01-14 DIAGNOSIS — S79911A Unspecified injury of right hip, initial encounter: Secondary | ICD-10-CM | POA: Diagnosis not present

## 2017-01-14 DIAGNOSIS — M25571 Pain in right ankle and joints of right foot: Secondary | ICD-10-CM | POA: Diagnosis not present

## 2017-01-14 DIAGNOSIS — T148XXA Other injury of unspecified body region, initial encounter: Secondary | ICD-10-CM | POA: Diagnosis not present

## 2017-01-15 ENCOUNTER — Ambulatory Visit (INDEPENDENT_AMBULATORY_CARE_PROVIDER_SITE_OTHER): Payer: Medicare Other | Admitting: *Deleted

## 2017-01-15 ENCOUNTER — Telehealth: Payer: Self-pay

## 2017-01-15 DIAGNOSIS — I5022 Chronic systolic (congestive) heart failure: Secondary | ICD-10-CM | POA: Diagnosis not present

## 2017-01-15 DIAGNOSIS — I472 Ventricular tachycardia, unspecified: Secondary | ICD-10-CM

## 2017-01-15 DIAGNOSIS — Z95810 Presence of automatic (implantable) cardiac defibrillator: Secondary | ICD-10-CM

## 2017-01-15 NOTE — Progress Notes (Signed)
EPIC Encounter for ICM Monitoring  Patient Name: Erin Jarvis is a 51 y.o. female Date: 01/15/2017 Primary Care Physican: [name], MD Primary Cardiologist:Hochrein/McLean Electrophysiologist: Allred Dry Weight: Last weight 272lbs        Attempted call to patient and unable to reach.  Left message to return call.  Transmission reviewed.    Thoracic impedance normal after taking 4 days of increased Torsemide as ordered by Dr Rayann Heman at last office visit 01/01/2017.  Prescribed dosage: Torsemide 20 mg daily alternating with 40 mg every other day.  Advised of new dosage today that was ordered by Dr Aundra Dubin in response from last ICM transmission on 9/17.    Labs: 12/26/2016 Creatinine 1.72, BUN 45, Potassium 3.3, Sodium 137, EGFR 33-38 12/05/2016 Creatinine 2.20, BUN 62, Potassium 4.0, Sodium 136, EGFR 24-28 10/23/2016 Creatinine 1.66, BUN 46, Potassium 3.6, Sodium 137, EGFR 34-40 09/19/2016 Creatinine 1.71, BUN 37, Potassium 4.2, Sodium 136, EGFR 33-38 08/21/2016 Creatinine 1.85, BUN 45, Potassium 3.9, Sodium 137, EGFR 30-35  06/13/2016 Creatinine 1.68, BUN 43, Potassium 4.4, Sodium 139, EGFR 34-39  05/14/2016 Creatinine 1.60, BUN 22, Potassium 3.8, Sodium 140, EGFR 36-41 05/02/2016 Creatinine 1.98, BUN 54, Potassium 3.9, Sodium 139, EGFR 28-33 04/22/2016 Creatinine 2.22, BUN 81, Potassium 4.4, Sodium 136, EGFR 24-28 01/15/2018Creatinine 2.13, BUN 45, Potassium 4.5, Sodium 135, EGFR 25-29  01/14/2018Creatinine 1.86, BUN 37, Potassium 4.4, Sodium 133, EGFR 30-35  01/13/2018Creatinine 1.70, BUN 31, Potassium 4.3, Sodium 137, EGFR 33-39  01/12/2018Creatinine 1.62, BUN 30, Potassium 4.2, Sodium 138, EGFR 35-41  01/11/2018Creatinine 1.59, BUN 40, Potassium 3.4, Sodium 137, EGFR 36-42  01/10/2018Creatinine 1.90, BUN 51, Potassium 4.4, Sodium 137, EGFR 29-34  Recommendations: NONE - Unable to reach patient   Follow-up plan: ICM clinic phone appointment on 02/16/2017.    Copy of ICM check sent to Dr. Rayann Heman.   3 month ICM trend: 01/15/2017   1 Year ICM trend:      [name], RN 01/15/2017 12:45 PM

## 2017-01-15 NOTE — Progress Notes (Signed)
Remote ICD transmission.   

## 2017-01-15 NOTE — Telephone Encounter (Signed)
Remote ICM transmission received.  Attempted call to patient and left message to return call. 

## 2017-01-22 ENCOUNTER — Encounter: Payer: Self-pay | Admitting: Cardiology

## 2017-01-27 DIAGNOSIS — E119 Type 2 diabetes mellitus without complications: Secondary | ICD-10-CM | POA: Diagnosis not present

## 2017-01-27 DIAGNOSIS — Z6841 Body Mass Index (BMI) 40.0 and over, adult: Secondary | ICD-10-CM | POA: Diagnosis not present

## 2017-01-27 DIAGNOSIS — I10 Essential (primary) hypertension: Secondary | ICD-10-CM | POA: Diagnosis not present

## 2017-01-27 DIAGNOSIS — R6889 Other general symptoms and signs: Secondary | ICD-10-CM | POA: Diagnosis not present

## 2017-01-27 DIAGNOSIS — Z713 Dietary counseling and surveillance: Secondary | ICD-10-CM | POA: Diagnosis not present

## 2017-01-29 ENCOUNTER — Other Ambulatory Visit (HOSPITAL_COMMUNITY): Payer: Self-pay | Admitting: Student

## 2017-02-02 DIAGNOSIS — I5022 Chronic systolic (congestive) heart failure: Secondary | ICD-10-CM | POA: Diagnosis not present

## 2017-02-02 DIAGNOSIS — E032 Hypothyroidism due to medicaments and other exogenous substances: Secondary | ICD-10-CM | POA: Diagnosis not present

## 2017-02-02 DIAGNOSIS — I10 Essential (primary) hypertension: Secondary | ICD-10-CM | POA: Diagnosis not present

## 2017-02-02 DIAGNOSIS — E1122 Type 2 diabetes mellitus with diabetic chronic kidney disease: Secondary | ICD-10-CM | POA: Diagnosis not present

## 2017-02-09 ENCOUNTER — Telehealth: Payer: Self-pay | Admitting: Cardiology

## 2017-02-09 NOTE — Telephone Encounter (Signed)
Close encounter 

## 2017-02-10 ENCOUNTER — Other Ambulatory Visit (HOSPITAL_COMMUNITY): Payer: Self-pay | Admitting: Cardiology

## 2017-02-11 ENCOUNTER — Telehealth: Payer: Self-pay | Admitting: Internal Medicine

## 2017-02-11 NOTE — Telephone Encounter (Signed)
   Chart reviewed as part of pre-operative protocol coverage. Last seen by Dr. Rayann Heman 01/02/17 who mention "She has been seen by bariatrics team and will likely have surgery in November". Will route to Dr. Rayann Heman for clearance as per note " Very complicated patient at risk for decompensation".   She is also due to see in CHF clinic by Dr.McLean. Will also route to pharmacy to review anticoagulation.   Pre-op covering staff: - Please schedule appointment (in CHF clinic) and call patient to inform them. - Please contact requesting surgeon's office via preferred method (i.e, phone, fax) to inform them of need for appointment prior to surgery.  [place], PA  02/11/2017, 4:46 PM

## 2017-02-11 NOTE — Telephone Encounter (Signed)
° °   [facility] Pre-operative Risk Assessment    Request for surgical clearance:  1. What type of surgery is being performed? Bariatric   2. When is this surgery scheduled? pending  3. Are there any medications that need to be held prior to surgery and how long? Eliquis for 7 days prior  4. Practice name and name of physician performing surgery? [facility]//Dr. Florene Glen   5. What is your office phone and fax number? Office # [phone]  Fax [phone]  6. Anesthesia type (None, local, MAC, general) ? General    Caryl Ada D [place] 02/11/2017, 1:49 PM  ________________________

## 2017-02-11 NOTE — Telephone Encounter (Deleted)
° °  [facility] Pre-operative Risk Assessment    Request for surgical clearance:  1. What type of surgery is being performed? ***   2. When is this surgery scheduled? ***   3. Are there any medications that need to be held prior to surgery and how long? Eliquis for 7 days prior  4. Practice name and name of physician performing surgery? [facility]//Dr. Florene Glen   5. What is your office phone and fax number? Office # [phone]  Fax [phone]  6. Anesthesia type (None, local, MAC, general) ? General    Marjean Donna 02/11/2017, 1:49 PM  _________________________________________________________________   (provider comments below)

## 2017-02-12 NOTE — Telephone Encounter (Signed)
F/U call:  Patient states she is returning call from [facility] in reference to clearance.

## 2017-02-12 NOTE — Telephone Encounter (Signed)
Pt takes Eliquis for afib with CHADS2VASc score of 4 (sex, HTN, CHF, DM). Pt has renal dysfunction with SCr of 1.72 however CrCl is still 75. There is no need to hold any blood thinner for 7 days for a procedure. Recommend holding for 2 days prior to allow for complete washout.

## 2017-02-13 NOTE — Telephone Encounter (Signed)
Patient scheduled for HF clinic eval with Isaiah Serge, NP on 02/18/17 @ 10am. Patient called and made aware of appointment date, time, location. Voiced understanding.

## 2017-02-13 NOTE — Telephone Encounter (Signed)
    Chart reviewed as part of pre-operative protocol coverage. Patient was contacted 02/13/2017 in reference to pre-operative risk assessment for pending surgery as outlined below.  Erin Jarvis was last seen on 09/19/2016 by Jettie Booze, NP.  Since that day, Erin Jarvis has done well, she still has significant dyspnea on exertion but generally feels like her volume status is okay.  She has been cleared by the nutritionist and by the psychologist at [facility] for the surgery.  She has an appointment with the bariatric MD next week.  Previous notes describe her needing an appointment with Dr. Aundra Dubin prior to the surgery because she is at high risk for decompensation.  I will route this recommendation via Epic fax function so that the appointment can be scheduled.  The Pharmacist has cleared her to hold the Eliquis prior to the procedure for 2 days which will allow for complete washout.  Please call with questions.  Rosaria Ferries, PA-C 02/13/2017, 3:09 PM

## 2017-02-16 ENCOUNTER — Ambulatory Visit (INDEPENDENT_AMBULATORY_CARE_PROVIDER_SITE_OTHER): Payer: Medicare Other

## 2017-02-16 DIAGNOSIS — I5022 Chronic systolic (congestive) heart failure: Secondary | ICD-10-CM | POA: Diagnosis not present

## 2017-02-16 DIAGNOSIS — Z95810 Presence of automatic (implantable) cardiac defibrillator: Secondary | ICD-10-CM | POA: Diagnosis not present

## 2017-02-16 NOTE — Progress Notes (Signed)
EPIC Encounter for ICM Monitoring  Patient Name: Erin Jarvis is a 51 y.o. female Date: 02/16/2017 Primary Care Physican: [name], MD Primary Cardiologist:Hochrein/McLean Electrophysiologist: Allred Dry Weight: Last weight 272lbs          Attempted call to patient and unable to reach.  Left detailed message regarding transmission.  Transmission reviewed.    Thoracic impedance normal.  Prescribed dosage: Torsemide 20 mg daily alternating with 40 mg every other day.    Labs: 12/26/2016 Creatinine 1.72, BUN 45, Potassium 3.3, Sodium 137, EGFR 33-38 12/05/2016 Creatinine 2.20, BUN 62, Potassium 4.0, Sodium 136, EGFR 24-28 10/23/2016 Creatinine 1.66, BUN 46, Potassium 3.6, Sodium 137, EGFR 34-40 09/19/2016 Creatinine 1.71, BUN 37, Potassium 4.2, Sodium 136, EGFR 33-38 08/21/2016 Creatinine 1.85, BUN 45, Potassium 3.9, Sodium 137, EGFR 30-35  06/13/2016 Creatinine 1.68, BUN 43, Potassium 4.4, Sodium 139, EGFR 34-39  05/14/2016 Creatinine 1.60, BUN 22, Potassium 3.8, Sodium 140, EGFR 36-41 05/02/2016 Creatinine 1.98, BUN 54, Potassium 3.9, Sodium 139, EGFR 28-33 04/22/2016 Creatinine 2.22, BUN 81, Potassium 4.4, Sodium 136, EGFR 24-28 01/15/2018Creatinine 2.13, BUN 45, Potassium 4.5, Sodium 135, EGFR 25-29  01/14/2018Creatinine 1.86, BUN 37, Potassium 4.4, Sodium 133, EGFR 30-35  01/13/2018Creatinine 1.70, BUN 31, Potassium 4.3, Sodium 137, EGFR 33-39  01/12/2018Creatinine 1.62, BUN 30, Potassium 4.2, Sodium 138, EGFR 35-41  01/11/2018Creatinine 1.59, BUN 40, Potassium 3.4, Sodium 137, EGFR 36-42  01/10/2018Creatinine 1.90, BUN 51, Potassium 4.4, Sodium 137, EGFR 29-34  Recommendations: Left voice mail with ICM number and encouraged to call if experiencing any fluid symptoms.  Follow-up plan: ICM clinic phone appointment on 03/19/2017.  Office appointment scheduled 02/18/2017 with Darrick Grinder, NP.  Copy of ICM check sent to Dr. Rayann Heman.   3 month ICM trend:  02/16/2017    1 Year ICM trend:       [name], RN 02/16/2017 10:30 AM

## 2017-02-18 ENCOUNTER — Ambulatory Visit (HOSPITAL_COMMUNITY)
Admission: RE | Admit: 2017-02-18 | Discharge: 2017-02-18 | Disposition: A | Discharge status: Home / Self Care | Payer: Medicare Other | Source: Ambulatory Visit | Attending: Internal Medicine | Admitting: Internal Medicine

## 2017-02-18 ENCOUNTER — Encounter (HOSPITAL_COMMUNITY): Payer: Self-pay | Admitting: *Deleted

## 2017-02-18 ENCOUNTER — Telehealth (HOSPITAL_COMMUNITY): Payer: Self-pay | Admitting: Cardiology

## 2017-02-18 VITALS — BP 120/66 | HR 60 | Wt 272.8 lb

## 2017-02-18 DIAGNOSIS — I429 Cardiomyopathy, unspecified: Secondary | ICD-10-CM | POA: Diagnosis not present

## 2017-02-18 DIAGNOSIS — I4892 Unspecified atrial flutter: Secondary | ICD-10-CM | POA: Diagnosis not present

## 2017-02-18 DIAGNOSIS — Z79899 Other long term (current) drug therapy: Secondary | ICD-10-CM | POA: Insufficient documentation

## 2017-02-18 DIAGNOSIS — G4733 Obstructive sleep apnea (adult) (pediatric): Secondary | ICD-10-CM | POA: Diagnosis not present

## 2017-02-18 DIAGNOSIS — Z8249 Family history of ischemic heart disease and other diseases of the circulatory system: Secondary | ICD-10-CM | POA: Diagnosis not present

## 2017-02-18 DIAGNOSIS — E1122 Type 2 diabetes mellitus with diabetic chronic kidney disease: Secondary | ICD-10-CM | POA: Diagnosis not present

## 2017-02-18 DIAGNOSIS — Z7901 Long term (current) use of anticoagulants: Secondary | ICD-10-CM | POA: Insufficient documentation

## 2017-02-18 DIAGNOSIS — N183 Chronic kidney disease, stage 3 unspecified: Secondary | ICD-10-CM

## 2017-02-18 DIAGNOSIS — I130 Hypertensive heart and chronic kidney disease with heart failure and stage 1 through stage 4 chronic kidney disease, or unspecified chronic kidney disease: Secondary | ICD-10-CM | POA: Insufficient documentation

## 2017-02-18 DIAGNOSIS — E039 Hypothyroidism, unspecified: Secondary | ICD-10-CM | POA: Insufficient documentation

## 2017-02-18 DIAGNOSIS — I5022 Chronic systolic (congestive) heart failure: Secondary | ICD-10-CM | POA: Diagnosis not present

## 2017-02-18 DIAGNOSIS — Z8261 Family history of arthritis: Secondary | ICD-10-CM | POA: Diagnosis not present

## 2017-02-18 DIAGNOSIS — Z825 Family history of asthma and other chronic lower respiratory diseases: Secondary | ICD-10-CM | POA: Diagnosis not present

## 2017-02-18 DIAGNOSIS — Z95810 Presence of automatic (implantable) cardiac defibrillator: Secondary | ICD-10-CM | POA: Insufficient documentation

## 2017-02-18 DIAGNOSIS — Z7984 Long term (current) use of oral hypoglycemic drugs: Secondary | ICD-10-CM | POA: Insufficient documentation

## 2017-02-18 DIAGNOSIS — O903 Peripartum cardiomyopathy: Secondary | ICD-10-CM

## 2017-02-18 DIAGNOSIS — Z6841 Body Mass Index (BMI) 40.0 and over, adult: Secondary | ICD-10-CM | POA: Diagnosis not present

## 2017-02-18 DIAGNOSIS — I4891 Unspecified atrial fibrillation: Secondary | ICD-10-CM | POA: Insufficient documentation

## 2017-02-18 LAB — BASIC METABOLIC PANEL
ANION GAP: 11 (ref 5–15)
BUN: 71 mg/dL — ABNORMAL HIGH (ref 6–20)
CALCIUM: 9.5 mg/dL (ref 8.9–10.3)
CO2: 21 mmol/L — ABNORMAL LOW (ref 22–32)
Chloride: 103 mmol/L (ref 101–111)
Creatinine, Ser: 2.37 mg/dL — ABNORMAL HIGH (ref 0.44–1.00)
GFR calc Af Amer: 26 mL/min — ABNORMAL LOW (ref >60)
GFR, EST NON AFRICAN AMERICAN: 22 mL/min — AB (ref >60)
GLUCOSE: 99 mg/dL (ref 65–99)
Potassium: 4.3 mmol/L (ref 3.5–5.1)
Sodium: 135 mmol/L (ref 135–145)

## 2017-02-18 LAB — HEPATIC FUNCTION PANEL
ALBUMIN: 4.1 g/dL (ref 3.5–5.0)
ALT: 21 U/L (ref 14–54)
AST: 25 U/L (ref 15–41)
Alkaline Phosphatase: 107 U/L (ref 38–126)
BILIRUBIN TOTAL: 0.6 mg/dL (ref 0.3–1.2)
Total Protein: 7.5 g/dL (ref 6.5–8.1)

## 2017-02-18 LAB — MAGNESIUM: Magnesium: 2 mg/dL (ref 1.7–2.4)

## 2017-02-18 MED ORDER — TORSEMIDE 20 MG PO TABS: 40.0000 mg | ORAL_TABLET | Freq: Every day | ORAL | 3 refills | Status: DC

## 2017-02-18 MED ORDER — TORSEMIDE 20 MG PO TABS: 20.0000 mg | ORAL_TABLET | ORAL | 3 refills | Status: DC

## 2017-02-18 NOTE — Addendum Note (Signed)
Addended by: Sosaia Pittinger, Sharlot Gowda on: 02/18/2017 02:20 PM   Modules accepted: Orders

## 2017-02-18 NOTE — Telephone Encounter (Signed)
Patient aware.patient voiced understanding, repeat labs at [facility] 11/29

## 2017-02-18 NOTE — Telephone Encounter (Signed)
-----   Message from Conrad Decherd, NP sent at 02/18/2017  1:34 PM EST ----- Please call. Hold torsemide for 2 days then restart torsemide 20 mg every other day. Repeat BMET in 7 days.

## 2017-02-18 NOTE — Patient Instructions (Signed)
Labs today (will call for abnormal results, otherwise no news is good news)  TAKE Torsemide 40 mg (2 Tablets) Once Daily.  Please call us at [phone], Option 5 this afternoon and let us know where to fax your surgical clearance letter.   Follow up with Dr. Aundra Dubin in 3 months.

## 2017-02-18 NOTE — Progress Notes (Signed)
PCP: Dr. Sherrie Sport HF Cardiology: Dr. 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 At one point EF improved to 45% from 20%.  She also has a history VT with Medtronic ICD placed in [place]. In 12/17, she was  admitted to [facility] with new onset atrial fibrillation/RVR and volume overload. Required IV diuresis and cardizem drip.  She remained in atrial fibrillation.  Warfarin was started that admission.   On 03/26/16, she saw Dr Percival Spanish and she was set up TEE/DCCV. Creatinine at that time was 2.03.  She had had a functional decline and dyspnea at rest. Weight at home had been trending up from 290 to 309 pounds.  SBP in 90s at Dr. Rosezella [place] office.   On 04/08/16, she presented for scheduled TEE/DC-CV, however thrombus was noted in LA appendage so DC-CV was not pursued. TEE also showed severe LV dysfunction. Dyspneic at rest. SBP soft.  She was admitted and ultimately required initiation of milrinone gtt + diuresis with IV Lasix and metolazone.  She lost > 20 lbs.  We were able to titrate her off milrinone and she was sent home on torsemide.  Rate was difficult to control, Toprol XL was titrated up to 75 mg bid.   Today she returns for surgical clearance for bariatric bypass surgery. Overall feeling ok. Denies SOB/PND/Orthopnea. No chest pina. Able to swim 10 laps with breaks. Weight at home 267 pounds. Using CPAP nightly. Taking all medications except potassium.    Labs (5/18): K 3.9, creatinine 1.85 Labs (1/18): digoxin 0.5, K 4.5, creatinine 2.13, digoxin 1.7, AST 53, ALT 34, TSH elevated Labs (2/18): K 3.9, creatinine 1.98 => 1.6, TSH 14, normal free T3 and free T4, LFTs normal, digoxin 1.7 Labs (3/18): K 3.8, creatinine 1.8, digoxin 1.0   PMH: 1. Chronic systolic CHF: Nonischemic cardiomyopathy.  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  EF initially 20%, rose to 45% at  one point.   - h/o VT => Medtronic ICD.  - Echo (1/18): EF 25-30%, mildly dilated RV with normal systolic function, moderate TR, PASP 31 mmHg.  - LHC/RHC (1/18): No CAD; mean RA 19, PA 46/20 mean 35, mean PCWP 24, CI 2.37 (Thermo), CI 3.16 (Fick), PVR 1.9 WU.  2. Atrial fibrillation: Persistent.  Diagnosed 12/17 when she presented to [facility] with afib/RVR.  TEE/DCCV set up in 1/18 but no DCCV due to LA thrombus on TEE.   3. Gout 4. Type II diabetes 5. HTN 6. OSA: Uses CPAP 7. CKD stage III: Has history of atrophic left kidney  8. Subclinical hypothyroidism 9. VT: 1/18, had ICD discharge.   SH: Divorced, on disability, lives with parents in [place], nonsmoker, no ETOH, no drugs.   Family History  Problem Relation Age of Onset  . Other Mother        has chronic pain syndrome from DDD with spinal cord stimulator  . Hypertension Mother   . COPD Father   . Arthritis/Rheumatoid Father    ROS: All systems reviewed and negative except as per HPI.   Current Outpatient Medications  Medication Sig Dispense Refill  . amiodarone (PACERONE) 200 MG tablet TAKE ONE TABLET (200 MG) BY MOUTH EACH DAY 30 tablet 3  . apixaban (ELIQUIS) 5 MG TABS tablet Take 1 tablet (5 mg total) by mouth 2 (two) times daily. 180 tablet 3  . atorvastatin (LIPITOR) 20 MG tablet Take  20 mg by mouth daily.    . citalopram (CELEXA) 10 MG tablet Take 10 mg by mouth daily.     Marland Kitchen levothyroxine (SYNTHROID, LEVOTHROID) 150 MCG tablet Take 150 mcg by mouth daily before breakfast.     . metoprolol succinate (TOPROL-XL) 100 MG 24 hr tablet TAKE ONE TABLET (100 MG) TWICE DAILY 60 tablet 6  . sacubitril-valsartan (ENTRESTO) 24-26 MG Take 1 tablet by mouth 2 (two) times daily. 60 tablet 11  . spironolactone (ALDACTONE) 25 MG tablet TAKE ONE TABLET BY MOUTH EACH DAY 30 tablet 6  . torsemide (DEMADEX) 20 MG tablet Take 20 mg by mouth 2 (two) times daily.    . metFORMIN (GLUCOPHAGE) 500 MG tablet Take by mouth 2 (two) times daily  with a meal.    . Multiple Vitamin (MULTIVITAMIN) tablet Take 1 tablet by mouth daily.    . potassium chloride 20 MEQ TBCR Take 20 mEq by mouth daily. (Patient not taking: Reported on 02/18/2017) 30 tablet 3   No current facility-administered medications for this encounter.    BP 120/66 (Patient Position: Sitting)   Pulse 60   Wt 272 lb 12.8 oz (123.7 kg)   LMP 03/14/2014 Comment: neg preg test  SpO2 100%   BMI 42.73 kg/m   General:  Well appearing. No resp difficulty. Walked in the clinic without difficulty.  HEENT: normal Neck: supple. no JVD. Carotids 2+ bilat; no bruits. No lymphadenopathy or thryomegaly appreciated. Cor: PMI nondisplaced. Regular rate & rhythm. No rubs, gallops or murmurs. Lungs: clear Abdomen: soft, nontender, nondistended. No hepatosplenomegaly. No bruits or masses. Good bowel sounds. Extremities: no cyanosis, clubbing, rash, edema Neuro: alert & orientedx3, cranial nerves grossly intact. moves all 4 extremities w/o difficulty. Affect pleasant   EKG: sinus bradycardia 59 bpm   Filed Weights   02/18/17 1011  Weight: 272 lb 12.8 oz (123.7 kg)    Assessment/Plan: 1. Chronic systolic CHF: Echo 1/70 with EF 25-30%, nonischemic cardiomyopathy. Echo 07/2016 with improved EF 40-45%. Cardiomyopathy known since 20. Possibly peri-partum. SPEP negative.  Has Medtronic ICD. LHC (1/18) with no coronary disease.   - ECHO 08/21/2016 EF 40-45%  - Volume status stable. Continue current dose of torsemide.  - Continue Entresto 24/26mg  BID.  - Continue Toprol Xl 100mg  daily.  - Continue Spiro 25mg  daily -BMEt today  2. Atrial flutter/fibrillation:  Noted in 12/17.  TEE in 1/18 showed LA appendage thrombus.  - Remains in NSR. - Continue Amiodarone 200 mg daily. She was reminded today to get a regular eye exam.  - LFTs stable today.  - She has subclinical hypothyroidism, taking Levoxyl. Followed closely by PCP.      - Continue Eliquis.  - She saw Dr. Rayann Heman to discuss  atrial fibrillation ablation.  3. CKD: Stage III.  She has an atrophic left kidney. Renal function elevated. Hold torsemide for 2 days then cut back 20 mg torsemide every other day.  - She sees Dr. Hollie Salk.   4. H/o VT: Has ICD. - No further VT.  5. OSA - compliant . Compliant with CPAP nightly.  6. Morbid Obesity-Body mass index is 42.73 kg/m.  Plan for Bariatric Surgery   Renal function elevated. Hold torsemide for 2 days then start torsemide 20 mg every other day. Repeat BMEt in 7 days.    May undergo bariatric surgery. She is at an intermediate risk from cardiac perspective but not prohibitive. I discussed with Dr Aundra Dubin as well.   Follow up in 3 months with Dr  McLean.     Darrick Grinder, NP-C 02/18/2017

## 2017-02-26 ENCOUNTER — Other Ambulatory Visit (HOSPITAL_COMMUNITY)
Admission: RE | Admit: 2017-02-26 | Discharge: 2017-02-26 | Disposition: A | Discharge status: Home / Self Care | Payer: Medicare Other | Source: Ambulatory Visit | Attending: Adult Health | Admitting: Adult Health

## 2017-02-26 DIAGNOSIS — A5031 Late congenital syphilitic interstitial keratitis: Secondary | ICD-10-CM | POA: Diagnosis present

## 2017-02-26 DIAGNOSIS — O903 Peripartum cardiomyopathy: Secondary | ICD-10-CM

## 2017-02-26 LAB — BASIC METABOLIC PANEL
ANION GAP: 8 (ref 5–15)
BUN: 39 mg/dL — AB (ref 6–20)
CHLORIDE: 107 mmol/L (ref 101–111)
CO2: 23 mmol/L (ref 22–32)
Calcium: 9.3 mg/dL (ref 8.9–10.3)
Creatinine, Ser: 1.67 mg/dL — ABNORMAL HIGH (ref 0.44–1.00)
GFR calc Af Amer: 39 mL/min — ABNORMAL LOW (ref >60)
GFR, EST NON AFRICAN AMERICAN: 34 mL/min — AB (ref >60)
GLUCOSE: 101 mg/dL — AB (ref 65–99)
POTASSIUM: 4.5 mmol/L (ref 3.5–5.1)
Sodium: 138 mmol/L (ref 135–145)

## 2017-02-27 DIAGNOSIS — I428 Other cardiomyopathies: Secondary | ICD-10-CM | POA: Diagnosis not present

## 2017-02-27 DIAGNOSIS — Z7984 Long term (current) use of oral hypoglycemic drugs: Secondary | ICD-10-CM | POA: Diagnosis not present

## 2017-02-27 DIAGNOSIS — Z7901 Long term (current) use of anticoagulants: Secondary | ICD-10-CM | POA: Diagnosis not present

## 2017-02-27 DIAGNOSIS — I10 Essential (primary) hypertension: Secondary | ICD-10-CM | POA: Diagnosis not present

## 2017-02-27 DIAGNOSIS — Z6841 Body Mass Index (BMI) 40.0 and over, adult: Secondary | ICD-10-CM | POA: Diagnosis not present

## 2017-02-27 DIAGNOSIS — Z9989 Dependence on other enabling machines and devices: Secondary | ICD-10-CM | POA: Diagnosis not present

## 2017-02-27 DIAGNOSIS — G4733 Obstructive sleep apnea (adult) (pediatric): Secondary | ICD-10-CM | POA: Diagnosis not present

## 2017-02-27 DIAGNOSIS — E119 Type 2 diabetes mellitus without complications: Secondary | ICD-10-CM | POA: Diagnosis not present

## 2017-03-03 ENCOUNTER — Other Ambulatory Visit: Payer: Self-pay

## 2017-03-19 ENCOUNTER — Telehealth: Payer: Self-pay

## 2017-03-19 ENCOUNTER — Ambulatory Visit (INDEPENDENT_AMBULATORY_CARE_PROVIDER_SITE_OTHER): Payer: Medicare Other

## 2017-03-19 DIAGNOSIS — I5022 Chronic systolic (congestive) heart failure: Secondary | ICD-10-CM | POA: Diagnosis not present

## 2017-03-19 DIAGNOSIS — Z95810 Presence of automatic (implantable) cardiac defibrillator: Secondary | ICD-10-CM

## 2017-03-19 NOTE — Progress Notes (Signed)
EPIC Encounter for ICM Monitoring  Patient Name: Erin Jarvis is a 51 y.o. female Date: 03/19/2017 Primary Care Physican: [name], MD Primary Cardiologist:Hochrein/McLean Electrophysiologist: Allred Dry Weight: 272lbs                                                 Heart Failure questions reviewed, pt symptomatic with weight gain of 5 lbs Torsemide decreased.  She said Torsemide was decreased due to elevated Creatinine at last OV.    Thoracic impedance abnormal suggesting fluid accumulation since 02/20/2017.  Prescribed dosage: Torsemide 20 mg 1 tablet every other day (decreased on 02/18/2017 at last HF office visit).     Labs: 02/26/2017 Creatinine 1.67, BUN 39, Potassium 4.5, Sodium 138, EGFR 34-39 02/18/2017 Creatinine 2.37, BUN 71, Potassium 4.3, Sodium 135, EGFR 22-26 12/26/2016 Creatinine 1.72, BUN 45, Potassium 3.3, Sodium 137, EGFR 33-38 12/05/2016 Creatinine 2.20, BUN 62, Potassium 4.0, Sodium 136, EGFR 24-28 10/23/2016 Creatinine 1.66, BUN 46, Potassium 3.6, Sodium 137, EGFR 34-40 09/19/2016 Creatinine 1.71, BUN 37, Potassium 4.2, Sodium 136, EGFR 33-38 08/21/2016 Creatinine 1.85, BUN 45, Potassium 3.9, Sodium 137, EGFR 30-35  06/13/2016 Creatinine 1.68, BUN 43, Potassium 4.4, Sodium 139, EGFR 34-39  05/14/2016 Creatinine 1.60, BUN 22, Potassium 3.8, Sodium 140, EGFR 36-41 05/02/2016 Creatinine 1.98, BUN 54, Potassium 3.9, Sodium 139, EGFR 28-33 04/22/2016 Creatinine 2.22, BUN 81, Potassium 4.4, Sodium 136, EGFR 24-28 01/15/2018Creatinine 2.13, BUN 45, Potassium 4.5, Sodium 135, EGFR 25-29  01/14/2018Creatinine 1.86, BUN 37, Potassium 4.4, Sodium 133, EGFR 30-35  01/13/2018Creatinine 1.70, BUN 31, Potassium 4.3, Sodium 137, EGFR 33-39  01/12/2018Creatinine 1.62, BUN 30, Potassium 4.2, Sodium 138, EGFR 35-41  01/11/2018Creatinine 1.59, BUN 40, Potassium 3.4, Sodium 137, EGFR 36-42  01/10/2018Creatinine 1.90, BUN 51, Potassium 4.4, Sodium 137,  EGFR 29-34  Recommendations: Copy of ICM check sent to Dr. Aundra Dubin and Dr. Rayann Heman for review and recommendations if needed.   Follow-up plan: ICM clinic phone appointment on 04/02/2017 to recheck fluid levels.    3 month ICM trend: 03/19/2017    1 Year ICM trend:       Erin Billings, RN 03/19/2017 1:47 PM

## 2017-03-19 NOTE — Telephone Encounter (Signed)
Remote ICM transmission received.  Attempted call to patient and left message to return call. 

## 2017-03-20 MED ORDER — TORSEMIDE 20 MG PO TABS: 20.0000 mg | ORAL_TABLET | Freq: Every day | ORAL | 3 refills | Status: DC

## 2017-03-20 NOTE — Progress Notes (Signed)
Call to patient and advised Dr Aundra Dubin ordered to increase torsemide to 40 mg daily x 3 days then 20 mg daily after that.  She repeated instructions back correctly.   BMET scheduled 03/27/2017 due to she cannot get to lab on 03/30/2017 and she will have drawn at [facility].  Next ICM follow up 04/02/2017

## 2017-03-20 NOTE — Progress Notes (Signed)
Received: Today  Message Contents  Thompson Grayer, MD  Short, Laurie Panda, RN; Larey Dresser, MD        Kirk Ruths,  Given weight gain and impedance trend, do you think we need to go back up on her diuretic?

## 2017-03-20 NOTE — Progress Notes (Signed)
Increase torsemide to 40 mg daily x 3 days then 20 mg daily after that.  BMET 10 days.

## 2017-03-27 ENCOUNTER — Other Ambulatory Visit (HOSPITAL_COMMUNITY)
Admission: RE | Admit: 2017-03-27 | Discharge: 2017-03-27 | Disposition: A | Discharge status: Home / Self Care | Payer: Medicare Other | Source: Ambulatory Visit | Attending: Cardiology | Admitting: Cardiology

## 2017-03-27 DIAGNOSIS — I5022 Chronic systolic (congestive) heart failure: Secondary | ICD-10-CM | POA: Diagnosis not present

## 2017-03-27 DIAGNOSIS — Z95810 Presence of automatic (implantable) cardiac defibrillator: Secondary | ICD-10-CM | POA: Diagnosis not present

## 2017-03-27 LAB — BASIC METABOLIC PANEL
Anion gap: 13 (ref 5–15)
BUN: 45 mg/dL — AB (ref 6–20)
CO2: 21 mmol/L — ABNORMAL LOW (ref 22–32)
CREATININE: 1.62 mg/dL — AB (ref 0.44–1.00)
Calcium: 9.4 mg/dL (ref 8.9–10.3)
Chloride: 103 mmol/L (ref 101–111)
GFR calc Af Amer: 41 mL/min — ABNORMAL LOW (ref >60)
GFR, EST NON AFRICAN AMERICAN: 35 mL/min — AB (ref >60)
Glucose, Bld: 195 mg/dL — ABNORMAL HIGH (ref 65–99)
Potassium: 4 mmol/L (ref 3.5–5.1)
SODIUM: 137 mmol/L (ref 135–145)

## 2017-04-02 ENCOUNTER — Ambulatory Visit (INDEPENDENT_AMBULATORY_CARE_PROVIDER_SITE_OTHER): Payer: Self-pay

## 2017-04-02 DIAGNOSIS — I5022 Chronic systolic (congestive) heart failure: Secondary | ICD-10-CM

## 2017-04-02 DIAGNOSIS — Z95810 Presence of automatic (implantable) cardiac defibrillator: Secondary | ICD-10-CM

## 2017-04-02 NOTE — Progress Notes (Signed)
EPIC Encounter for ICM Monitoring  Patient Name: Erin Jarvis is a 52 y.o. female Date: 04/02/2017 Primary Care Physican: [name], MD Primary Cardiologist:Hochrein/McLean Electrophysiologist: Allred Dry Weight: Previous weight 272lbs      Attempted call to patient and unable to reach.  Left message to return call.  Transmission reviewed.    Thoracic impedance normal.  Prescribed dosage: Torsemide 20 mg 1 tablet daily    Labs: 03/27/2017 Creatinine 1.62, BUN 45, Potassium 4.0, Sodium 137, EGFR 35-41 02/26/2017 Creatinine 1.67, BUN 39, Potassium 4.5, Sodium 138, EGFR 34-39 02/18/2017 Creatinine 2.37, BUN 71, Potassium 4.3, Sodium 135, EGFR 22-26 12/26/2016 Creatinine 1.72, BUN 45, Potassium 3.3, Sodium 137, EGFR 33-38 12/05/2016 Creatinine 2.20, BUN 62, Potassium 4.0, Sodium 136, EGFR 24-28 10/23/2016 Creatinine 1.66, BUN 46, Potassium 3.6, Sodium 137, EGFR 34-40 09/19/2016 Creatinine 1.71, BUN 37, Potassium 4.2, Sodium 136, EGFR 33-38 08/21/2016 Creatinine 1.85, BUN 45, Potassium 3.9, Sodium 137, EGFR 30-35  06/13/2016 Creatinine 1.68, BUN 43, Potassium 4.4, Sodium 139, EGFR 34-39  05/14/2016 Creatinine 1.60, BUN 22, Potassium 3.8, Sodium 140, EGFR 36-41 05/02/2016 Creatinine 1.98, BUN 54, Potassium 3.9, Sodium 139, EGFR 28-33 04/22/2016 Creatinine 2.22, BUN 81, Potassium 4.4, Sodium 136, EGFR 24-28 01/15/2018Creatinine 2.13, BUN 45, Potassium 4.5, Sodium 135, EGFR 25-29  01/14/2018Creatinine 1.86, BUN 37, Potassium 4.4, Sodium 133, EGFR 30-35  01/13/2018Creatinine 1.70, BUN 31, Potassium 4.3, Sodium 137, EGFR 33-39  01/12/2018Creatinine 1.62, BUN 30, Potassium 4.2, Sodium 138, EGFR 35-41  01/11/2018Creatinine 1.59, BUN 40, Potassium 3.4, Sodium 137, EGFR 36-42  01/10/2018Creatinine 1.90, BUN 51, Potassium 4.4, Sodium 137, EGFR 29-34  Recommendations: NONE - Unable to reach.  Follow-up plan: ICM clinic phone appointment on 05/04/2017.  Office  appointment scheduled 05/21/2017 with Dr. Aundra Dubin.  Copy of ICM check sent to Dr. Rayann Heman.   3 month ICM trend: 04/02/2017    1 Year ICM trend:       [name], RN 04/02/2017 4:15 PM

## 2017-04-03 NOTE — Progress Notes (Signed)
Patient returned call.  She stated she is feeling fine.  She has been eating out in restaurants a lot over the holidays but plans to eat more at home this month.  No changes and encouraged to call for fluid symptoms.

## 2017-04-21 DIAGNOSIS — Z1231 Encounter for screening mammogram for malignant neoplasm of breast: Secondary | ICD-10-CM | POA: Diagnosis not present

## 2017-05-04 ENCOUNTER — Telehealth: Payer: Self-pay | Admitting: Cardiology

## 2017-05-04 ENCOUNTER — Ambulatory Visit (INDEPENDENT_AMBULATORY_CARE_PROVIDER_SITE_OTHER): Payer: Medicare Other | Admitting: *Deleted

## 2017-05-04 DIAGNOSIS — O903 Peripartum cardiomyopathy: Secondary | ICD-10-CM

## 2017-05-04 DIAGNOSIS — I5022 Chronic systolic (congestive) heart failure: Secondary | ICD-10-CM | POA: Diagnosis not present

## 2017-05-04 DIAGNOSIS — Z95810 Presence of automatic (implantable) cardiac defibrillator: Secondary | ICD-10-CM

## 2017-05-04 NOTE — Telephone Encounter (Signed)
LMOVM reminding pt to send remote transmission.   

## 2017-05-05 ENCOUNTER — Encounter: Payer: Self-pay | Admitting: Cardiology

## 2017-05-05 ENCOUNTER — Telehealth: Payer: Self-pay

## 2017-05-05 NOTE — Progress Notes (Signed)
Remote ICD transmission.   

## 2017-05-05 NOTE — Telephone Encounter (Signed)
Remote ICM transmission received.  Attempted call to patient and left message to return call. 

## 2017-05-05 NOTE — Progress Notes (Signed)
EPIC Encounter for ICM Monitoring  Patient Name: Erin Jarvis is a 52 y.o. female Date: 05/05/2017 Primary Care Physican: [name], MD Primary Cardiologist:Hochrein/McLean Electrophysiologist: Allred Dry Weight: Previous weight 272lbs      Attempted call to patient and unable to reach.  Left message to return call.  Transmission reviewed.    Thoracic impedance close to baseline normal.  Prescribed dosage: Torsemide20 mg 1 tablet daily   Labs: 03/27/2017 Creatinine 1.62, BUN 45, Potassium 4.0, Sodium 137, EGFR 35-41 02/26/2017 Creatinine 1.67, BUN 39, Potassium 4.5, Sodium 138, EGFR 34-39 02/18/2017 Creatinine 2.37, BUN 71, Potassium 4.3, Sodium 135, EGFR 22-26 12/26/2016 Creatinine 1.72, BUN 45, Potassium 3.3, Sodium 137, EGFR 33-38 12/05/2016 Creatinine 2.20, BUN 62, Potassium 4.0, Sodium 136, EGFR 24-28 10/23/2016 Creatinine 1.66, BUN 46, Potassium 3.6, Sodium 137, EGFR 34-40 09/19/2016 Creatinine 1.71, BUN 37, Potassium 4.2, Sodium 136, EGFR 33-38 08/21/2016 Creatinine 1.85, BUN 45, Potassium 3.9, Sodium 137, EGFR 30-35  06/13/2016 Creatinine 1.68, BUN 43, Potassium 4.4, Sodium 139, EGFR 34-39  05/14/2016 Creatinine 1.60, BUN 22, Potassium 3.8, Sodium 140, EGFR 36-41 05/02/2016 Creatinine 1.98, BUN 54, Potassium 3.9, Sodium 139, EGFR 28-33 04/22/2016 Creatinine 2.22, BUN 81, Potassium 4.4, Sodium 136, EGFR 24-28 01/15/2018Creatinine 2.13, BUN 45, Potassium 4.5, Sodium 135, EGFR 25-29  01/14/2018Creatinine 1.86, BUN 37, Potassium 4.4, Sodium 133, EGFR 30-35  01/13/2018Creatinine 1.70, BUN 31, Potassium 4.3, Sodium 137, EGFR 33-39  01/12/2018Creatinine 1.62, BUN 30, Potassium 4.2, Sodium 138, EGFR 35-41  01/11/2018Creatinine 1.59, BUN 40, Potassium 3.4, Sodium 137, EGFR 36-42  01/10/2018Creatinine 1.90, BUN 51, Potassium 4.4, Sodium 137, EGFR 29-34  Recommendations: NONE - Unable to reach.  Follow-up plan: ICM clinic phone appointment on 06/05/2017.     Copy of ICM check sent to Dr. Rayann Heman.   3 month ICM trend: 05/05/2017    1 Year ICM trend:       [name], RN 05/05/2017 11:34 AM

## 2017-05-05 NOTE — Progress Notes (Signed)
Patient returned call.  She stated her weight is around 270 lbs.  She has been eating too much sodium and knows to cut back on the intake.  No changes today and encouraged to call for any fluid symptoms.  Next ICM remote transmission 06/05/2017.

## 2017-05-21 ENCOUNTER — Encounter (HOSPITAL_COMMUNITY): Payer: Medicare Other | Admitting: Cardiology

## 2017-05-26 LAB — CUP PACEART REMOTE DEVICE CHECK
Brady Statistic RV Percent Paced: 0.26 %
Date Time Interrogation Session: 20190205154317
HIGH POWER IMPEDANCE MEASURED VALUE: 61 Ohm
HighPow Impedance: 74 Ohm
Implantable Lead Location: 753860
Implantable Lead Model: 6947
Lead Channel Impedance Value: 589 Ohm
Lead Channel Sensing Intrinsic Amplitude: 3.5 mV
Lead Channel Setting Pacing Amplitude: 5 V
MDC IDC LEAD IMPLANT DT: 20100824
MDC IDC MSMT BATTERY VOLTAGE: 2.65 V
MDC IDC MSMT LEADCHNL RV PACING THRESHOLD AMPLITUDE: 1.375 V
MDC IDC MSMT LEADCHNL RV PACING THRESHOLD PULSEWIDTH: 0.4 ms
MDC IDC MSMT LEADCHNL RV SENSING INTR AMPL: 3.5 mV
MDC IDC PG IMPLANT DT: 20100824
MDC IDC SET LEADCHNL RV PACING PULSEWIDTH: 0.4 ms
MDC IDC SET LEADCHNL RV SENSING SENSITIVITY: 0.3 mV

## 2017-05-29 HISTORY — PX: GASTRIC BYPASS: SHX52

## 2017-06-01 ENCOUNTER — Ambulatory Visit (INDEPENDENT_AMBULATORY_CARE_PROVIDER_SITE_OTHER): Payer: Self-pay | Admitting: *Deleted

## 2017-06-01 DIAGNOSIS — O903 Peripartum cardiomyopathy: Secondary | ICD-10-CM | POA: Diagnosis not present

## 2017-06-01 DIAGNOSIS — R001 Bradycardia, unspecified: Secondary | ICD-10-CM | POA: Diagnosis not present

## 2017-06-01 DIAGNOSIS — Z95810 Presence of automatic (implantable) cardiac defibrillator: Secondary | ICD-10-CM | POA: Diagnosis not present

## 2017-06-01 DIAGNOSIS — E1122 Type 2 diabetes mellitus with diabetic chronic kidney disease: Secondary | ICD-10-CM | POA: Diagnosis not present

## 2017-06-01 DIAGNOSIS — I5022 Chronic systolic (congestive) heart failure: Secondary | ICD-10-CM

## 2017-06-01 DIAGNOSIS — Z6841 Body Mass Index (BMI) 40.0 and over, adult: Secondary | ICD-10-CM | POA: Diagnosis not present

## 2017-06-01 DIAGNOSIS — Z01818 Encounter for other preprocedural examination: Secondary | ICD-10-CM | POA: Diagnosis not present

## 2017-06-01 DIAGNOSIS — E785 Hyperlipidemia, unspecified: Secondary | ICD-10-CM | POA: Diagnosis not present

## 2017-06-01 DIAGNOSIS — N189 Chronic kidney disease, unspecified: Secondary | ICD-10-CM | POA: Diagnosis not present

## 2017-06-01 DIAGNOSIS — Z7901 Long term (current) use of anticoagulants: Secondary | ICD-10-CM | POA: Diagnosis not present

## 2017-06-01 DIAGNOSIS — Z7984 Long term (current) use of oral hypoglycemic drugs: Secondary | ICD-10-CM | POA: Diagnosis not present

## 2017-06-01 DIAGNOSIS — Z713 Dietary counseling and surveillance: Secondary | ICD-10-CM | POA: Diagnosis not present

## 2017-06-01 DIAGNOSIS — I4891 Unspecified atrial fibrillation: Secondary | ICD-10-CM | POA: Diagnosis not present

## 2017-06-01 DIAGNOSIS — G4733 Obstructive sleep apnea (adult) (pediatric): Secondary | ICD-10-CM | POA: Diagnosis not present

## 2017-06-01 DIAGNOSIS — K430 Incisional hernia with obstruction, without gangrene: Secondary | ICD-10-CM | POA: Diagnosis not present

## 2017-06-01 DIAGNOSIS — E119 Type 2 diabetes mellitus without complications: Secondary | ICD-10-CM | POA: Diagnosis not present

## 2017-06-01 DIAGNOSIS — E039 Hypothyroidism, unspecified: Secondary | ICD-10-CM | POA: Diagnosis not present

## 2017-06-01 DIAGNOSIS — I129 Hypertensive chronic kidney disease with stage 1 through stage 4 chronic kidney disease, or unspecified chronic kidney disease: Secondary | ICD-10-CM | POA: Diagnosis not present

## 2017-06-01 DIAGNOSIS — I428 Other cardiomyopathies: Secondary | ICD-10-CM | POA: Diagnosis not present

## 2017-06-01 DIAGNOSIS — I472 Ventricular tachycardia: Secondary | ICD-10-CM | POA: Diagnosis not present

## 2017-06-01 DIAGNOSIS — I429 Cardiomyopathy, unspecified: Secondary | ICD-10-CM | POA: Diagnosis not present

## 2017-06-01 LAB — CUP PACEART REMOTE DEVICE CHECK
Date Time Interrogation Session: 20190304161816
HIGH POWER IMPEDANCE MEASURED VALUE: 66 Ohm
HighPow Impedance: 86 Ohm
Implantable Lead Implant Date: 20100824
Implantable Lead Location: 753860
Implantable Pulse Generator Implant Date: 20100824
Lead Channel Impedance Value: 494 Ohm
Lead Channel Pacing Threshold Amplitude: 1.875 V
Lead Channel Pacing Threshold Pulse Width: 0.4 ms
Lead Channel Sensing Intrinsic Amplitude: 3.875 mV
Lead Channel Setting Sensing Sensitivity: 0.3 mV
MDC IDC MSMT BATTERY VOLTAGE: 2.65 V
MDC IDC MSMT LEADCHNL RV SENSING INTR AMPL: 3.875 mV
MDC IDC SET LEADCHNL RV PACING AMPLITUDE: 4 V
MDC IDC SET LEADCHNL RV PACING PULSEWIDTH: 0.4 ms
MDC IDC STAT BRADY RV PERCENT PACED: 0.2 %

## 2017-06-01 NOTE — Progress Notes (Signed)
Remote defibrillator check.  

## 2017-06-02 ENCOUNTER — Telehealth: Payer: Self-pay

## 2017-06-02 NOTE — Telephone Encounter (Signed)
   [name]    Request for surgical clearance:  1. What type of surgery is being performed? Laparoscopic Gastric Bypass   2. When is this surgery scheduled? 06/16/17    3. What type of clearance is required (medical clearance vs. Pharmacy clearance to hold med vs. Both)? Both  4. Are there any medications that need to be held prior to surgery and how long?  Dr. Florene Glen refers: "She needs to be off for 5 to 7 days with a Lovenox bridge. We will restart anticoag after surgery as soon as it's safe. Likely this will be 48-72 hours. I am also going to attempt to contact her and ask that she once again contact you. Please advise.    5. Practice name and name of physician performing surgery? [facility]/ Dr. Cathlean Sauer   6. What is your office phone and fax number? Phone [phone] FAX [phone]   7. Anesthesia type (None, local, MAC, general) ? Not known   Erin Jarvis 06/02/2017, 2:35 PM  _________________________________________________________________   (provider comments below)

## 2017-06-03 ENCOUNTER — Encounter: Payer: Self-pay | Admitting: Cardiology

## 2017-06-04 NOTE — Telephone Encounter (Signed)
Unsure why office is requesting holding anticoagulation 5-7 days prior with Lovenox bridge since patient takes Eliquis, not warfarin.  She takes Eliquis for afib with CHADS2VASc score of 4 (age, CHF, HTN, DM). Pt also has hx of DVT in 2008 and LAA thrombus in 04/08/2016. CrCl is 14mL/min, SCr 1.62.  Would typically recommend holding Eliquis for 2-3 days prior to procedure, however due to hx of LAA thrombus 1 year ago will consult with Dr Aundra Dubin regarding periprocedural anticoagulation.

## 2017-06-04 NOTE — Telephone Encounter (Signed)
I have called the patient, she denies any chest pain. She has a h/o NICM with EF 35-30%. She had knee fracture as result of fall and still has some issues climbing stairs, but she is able to swim twice a week and each time swim for 10 laps without much issue. She denies any recent heart failure exacerbation. Her RCRI risk score is Class III 6.6% cardiovascular risk during the surgery.   For some reason, even though she is on eliquis, per patient, Dr. Florene Glen want to stop the eliquis for 5 days prior to the laproscopic gastric bypass and have Lovenox bridge. Not sure why 5 days with eliquis, bleeding risk?, however will defer to our clinical pharmacist.   Given significant comorbidities, will have this reviewed by Dr. Aundra Dubin. Dr. Aundra Dubin please review and send your response to P CV Div Preop.     Hilbert Corrigan PA Pager: [phone]

## 2017-06-04 NOTE — Telephone Encounter (Signed)
Would stop Eliquis 2 days prior to surgery, no Lovenox bridge.

## 2017-06-05 ENCOUNTER — Ambulatory Visit (INDEPENDENT_AMBULATORY_CARE_PROVIDER_SITE_OTHER): Payer: Medicare Other

## 2017-06-05 DIAGNOSIS — I5022 Chronic systolic (congestive) heart failure: Secondary | ICD-10-CM

## 2017-06-05 DIAGNOSIS — Z95810 Presence of automatic (implantable) cardiac defibrillator: Secondary | ICD-10-CM

## 2017-06-05 NOTE — Progress Notes (Signed)
EPIC Encounter for ICM Monitoring  Patient Name: Erin Jarvis is a 52 y.o. female Date: 06/05/2017 Primary Care Physican: [name], MD Primary Cardiologist:Hochrein/McLean Electrophysiologist: Allred Dry Weight:272lbs      Heart Failure questions reviewed, pt asymptomatic.   Thoracic impedance normal  Prescribed dosage: Torsemide20 mg 1 tabletdaily   Labs: 03/27/2017 Creatinine 1.62, BUN 45, Potassium 4.0, Sodium 137, EGFR 35-41 02/26/2017 Creatinine 1.67, BUN 39, Potassium 4.5, Sodium 138, EGFR 34-39 02/18/2017 Creatinine 2.37, BUN 71, Potassium 4.3, Sodium 135, EGFR 22-26 12/26/2016 Creatinine 1.72, BUN 45, Potassium 3.3, Sodium 137, EGFR 33-38 12/05/2016 Creatinine 2.20, BUN 62, Potassium 4.0, Sodium 136, EGFR 24-28 10/23/2016 Creatinine 1.66, BUN 46, Potassium 3.6, Sodium 137, EGFR 34-40 09/19/2016 Creatinine 1.71, BUN 37, Potassium 4.2, Sodium 136, EGFR 33-38 08/21/2016 Creatinine 1.85, BUN 45, Potassium 3.9, Sodium 137, EGFR 30-35  06/13/2016 Creatinine 1.68, BUN 43, Potassium 4.4, Sodium 139, EGFR 34-39  05/14/2016 Creatinine 1.60, BUN 22, Potassium 3.8, Sodium 140, EGFR 36-41 05/02/2016 Creatinine 1.98, BUN 54, Potassium 3.9, Sodium 139, EGFR 28-33 04/22/2016 Creatinine 2.22, BUN 81, Potassium 4.4, Sodium 136, EGFR 24-28 01/15/2018Creatinine 2.13, BUN 45, Potassium 4.5, Sodium 135, EGFR 25-29  01/14/2018Creatinine 1.86, BUN 37, Potassium 4.4, Sodium 133, EGFR 30-35  01/13/2018Creatinine 1.70, BUN 31, Potassium 4.3, Sodium 137, EGFR 33-39  01/12/2018Creatinine 1.62, BUN 30, Potassium 4.2, Sodium 138, EGFR 35-41  01/11/2018Creatinine 1.59, BUN 40, Potassium 3.4, Sodium 137, EGFR 36-42  01/10/2018Creatinine 1.90, BUN 51, Potassium 4.4, Sodium 137, EGFR 29-34  Recommendations: No changes.  Encouraged to call for fluid symptoms.  Follow-up plan: ICM clinic phone appointment on 07/06/2017.    Copy of ICM check sent to Dr. Rayann Heman.   3 month ICM  trend: 06/05/2017    1 Year ICM trend:       [name], RN 06/05/2017 9:07 AM

## 2017-06-05 NOTE — Telephone Encounter (Signed)
   Primary Cardiologist:  Dr Aundra Dubin  Chart reviewed as part of pre-operative protocol coverage. Given past medical history and time since last visit, based on ACC/AHA guidelines, Erin Jarvis would be at acceptable risk for the planned procedure without further cardiovascular testing.   Dr Aundra Dubin and our pharmacist suggest holding the pt's Eliquis for 2 days pre op. Non need for Lovenox bridging, resume ASAP post op.   I will route this recommendation to the requesting party via Epic fax function and remove from pre-op pool.  Please call with questions.  Kerin Ransom, PA-C 06/05/2017, 2:30 PM

## 2017-06-11 ENCOUNTER — Other Ambulatory Visit (HOSPITAL_COMMUNITY): Payer: Self-pay | Admitting: Cardiology

## 2017-06-16 DIAGNOSIS — K430 Incisional hernia with obstruction, without gangrene: Secondary | ICD-10-CM | POA: Diagnosis present

## 2017-06-16 DIAGNOSIS — N189 Chronic kidney disease, unspecified: Secondary | ICD-10-CM | POA: Diagnosis not present

## 2017-06-16 DIAGNOSIS — Z95810 Presence of automatic (implantable) cardiac defibrillator: Secondary | ICD-10-CM | POA: Diagnosis not present

## 2017-06-16 DIAGNOSIS — G4733 Obstructive sleep apnea (adult) (pediatric): Secondary | ICD-10-CM | POA: Diagnosis not present

## 2017-06-16 DIAGNOSIS — E1122 Type 2 diabetes mellitus with diabetic chronic kidney disease: Secondary | ICD-10-CM | POA: Diagnosis present

## 2017-06-16 DIAGNOSIS — I129 Hypertensive chronic kidney disease with stage 1 through stage 4 chronic kidney disease, or unspecified chronic kidney disease: Secondary | ICD-10-CM | POA: Diagnosis present

## 2017-06-16 DIAGNOSIS — I130 Hypertensive heart and chronic kidney disease with heart failure and stage 1 through stage 4 chronic kidney disease, or unspecified chronic kidney disease: Secondary | ICD-10-CM | POA: Diagnosis not present

## 2017-06-16 DIAGNOSIS — Z9884 Bariatric surgery status: Secondary | ICD-10-CM | POA: Diagnosis not present

## 2017-06-16 DIAGNOSIS — I4891 Unspecified atrial fibrillation: Secondary | ICD-10-CM | POA: Diagnosis present

## 2017-06-16 DIAGNOSIS — I472 Ventricular tachycardia: Secondary | ICD-10-CM | POA: Diagnosis not present

## 2017-06-16 DIAGNOSIS — I509 Heart failure, unspecified: Secondary | ICD-10-CM | POA: Diagnosis not present

## 2017-06-16 DIAGNOSIS — Z7984 Long term (current) use of oral hypoglycemic drugs: Secondary | ICD-10-CM | POA: Diagnosis not present

## 2017-06-16 DIAGNOSIS — I428 Other cardiomyopathies: Secondary | ICD-10-CM | POA: Diagnosis not present

## 2017-06-16 DIAGNOSIS — E119 Type 2 diabetes mellitus without complications: Secondary | ICD-10-CM | POA: Diagnosis not present

## 2017-06-16 DIAGNOSIS — Z7901 Long term (current) use of anticoagulants: Secondary | ICD-10-CM | POA: Diagnosis not present

## 2017-06-16 DIAGNOSIS — E785 Hyperlipidemia, unspecified: Secondary | ICD-10-CM | POA: Diagnosis present

## 2017-06-16 DIAGNOSIS — I429 Cardiomyopathy, unspecified: Secondary | ICD-10-CM | POA: Diagnosis present

## 2017-06-16 DIAGNOSIS — K450 Other specified abdominal hernia with obstruction, without gangrene: Secondary | ICD-10-CM | POA: Diagnosis not present

## 2017-06-16 DIAGNOSIS — E039 Hypothyroidism, unspecified: Secondary | ICD-10-CM | POA: Diagnosis present

## 2017-06-16 DIAGNOSIS — Z6841 Body Mass Index (BMI) 40.0 and over, adult: Secondary | ICD-10-CM | POA: Diagnosis not present

## 2017-06-16 DIAGNOSIS — Z9049 Acquired absence of other specified parts of digestive tract: Secondary | ICD-10-CM | POA: Diagnosis not present

## 2017-06-25 ENCOUNTER — Other Ambulatory Visit (HOSPITAL_COMMUNITY): Payer: Self-pay | Admitting: *Deleted

## 2017-06-25 MED ORDER — APIXABAN 5 MG PO TABS: 5.0000 mg | ORAL_TABLET | Freq: Two times a day (BID) | ORAL | 3 refills | Status: DC

## 2017-07-06 ENCOUNTER — Telehealth (HOSPITAL_COMMUNITY): Payer: Self-pay | Admitting: Pharmacist

## 2017-07-06 ENCOUNTER — Ambulatory Visit (INDEPENDENT_AMBULATORY_CARE_PROVIDER_SITE_OTHER): Payer: Medicare Other

## 2017-07-06 DIAGNOSIS — I5022 Chronic systolic (congestive) heart failure: Secondary | ICD-10-CM

## 2017-07-06 DIAGNOSIS — Z95810 Presence of automatic (implantable) cardiac defibrillator: Secondary | ICD-10-CM | POA: Diagnosis not present

## 2017-07-06 NOTE — Telephone Encounter (Signed)
[employer] patient assistance approved for Eliquis 5 mg BID through 03/30/18.   Ruta Hinds. Velva Harman, PharmD, BCPS, CPP Clinical Pharmacist Phone: [phone] 07/06/2017 10:29 AM

## 2017-07-06 NOTE — Progress Notes (Signed)
EPIC Encounter for ICM Monitoring  Patient Name: Erin Jarvis is a 52 y.o. female Date: 07/06/2017 Primary Care Physican: [name], MD Primary Cardiologist:Hochrein/McLean Electrophysiologist: Allred Dry Weight:Previous weight 272lbs           Attempted call to patient and unable to reach.   Transmission reviewed.  Gastric by pass surgery was on 06/16/2017.   Thoracic impedance abnormal suggesting fluid accumulation but is trending toward baseline. Impedance with multiple peaks and valleys.  Prescribed dosage: Torsemide20 mg 1 tabletdaily   Labs: 06/11/2017 Creatinine 2.06, BUN 53, Potassium 4.3, Sodium 135, EGFR 27-31 03/27/2017 Creatinine 1.62, BUN 45, Potassium 4.0, Sodium 137, EGFR 35-41 02/26/2017 Creatinine 1.67, BUN 39, Potassium 4.5, Sodium 138, EGFR 34-39 02/18/2017 Creatinine 2.37, BUN 71, Potassium 4.3, Sodium 135, EGFR 22-26 12/26/2016 Creatinine 1.72, BUN 45, Potassium 3.3, Sodium 137, EGFR 33-38 12/05/2016 Creatinine 2.20, BUN 62, Potassium 4.0, Sodium 136, EGFR 24-28 10/23/2016 Creatinine 1.66, BUN 46, Potassium 3.6, Sodium 137, EGFR 34-40 09/19/2016 Creatinine 1.71, BUN 37, Potassium 4.2, Sodium 136, EGFR 33-38 08/21/2016 Creatinine 1.85, BUN 45, Potassium 3.9, Sodium 137, EGFR 30-35  06/13/2016 Creatinine 1.68, BUN 43, Potassium 4.4, Sodium 139, EGFR 34-39  05/14/2016 Creatinine 1.60, BUN 22, Potassium 3.8, Sodium 140, EGFR 36-41 05/02/2016 Creatinine 1.98, BUN 54, Potassium 3.9, Sodium 139, EGFR 28-33 04/22/2016 Creatinine 2.22, BUN 81, Potassium 4.4, Sodium 136, EGFR 24-28 01/15/2018Creatinine 2.13, BUN 45, Potassium 4.5, Sodium 135, EGFR 25-29  01/14/2018Creatinine 1.86, BUN 37, Potassium 4.4, Sodium 133, EGFR 30-35  01/13/2018Creatinine 1.70, BUN 31, Potassium 4.3, Sodium 137, EGFR 33-39  01/12/2018Creatinine 1.62, BUN 30, Potassium 4.2, Sodium 138, EGFR 35-41  01/11/2018Creatinine 1.59, BUN 40, Potassium 3.4, Sodium 137, EGFR 36-42    01/10/2018Creatinine 1.90, BUN 51, Potassium 4.4, Sodium 137, EGFR 29-34  Recommendations: NONE - Unable to reach.  Follow-up plan: ICM clinic phone appointment on 07/13/2017 to recheck fluid levels.  Office appointment scheduled 07/07/2017 with Dr. Aundra Dubin.  Copy of ICM check sent to Dr. Rayann Heman.   3 month ICM trend: 07/06/2017    1 Year ICM trend:       [name], RN 07/06/2017 12:15 PM

## 2017-07-07 ENCOUNTER — Ambulatory Visit (HOSPITAL_COMMUNITY)
Admission: RE | Admit: 2017-07-07 | Discharge: 2017-07-07 | Disposition: A | Discharge status: Home / Self Care | Payer: Medicare Other | Source: Ambulatory Visit | Attending: Cardiology | Admitting: Cardiology

## 2017-07-07 ENCOUNTER — Encounter (HOSPITAL_COMMUNITY): Payer: Self-pay | Admitting: Cardiology

## 2017-07-07 ENCOUNTER — Other Ambulatory Visit: Payer: Self-pay

## 2017-07-07 ENCOUNTER — Telehealth: Payer: Self-pay

## 2017-07-07 VITALS — BP 126/84 | HR 66 | Wt 260.8 lb

## 2017-07-07 DIAGNOSIS — N183 Chronic kidney disease, stage 3 unspecified: Secondary | ICD-10-CM

## 2017-07-07 DIAGNOSIS — Z79890 Hormone replacement therapy (postmenopausal): Secondary | ICD-10-CM | POA: Diagnosis not present

## 2017-07-07 DIAGNOSIS — I428 Other cardiomyopathies: Secondary | ICD-10-CM | POA: Insufficient documentation

## 2017-07-07 DIAGNOSIS — I4891 Unspecified atrial fibrillation: Secondary | ICD-10-CM | POA: Diagnosis not present

## 2017-07-07 DIAGNOSIS — I480 Paroxysmal atrial fibrillation: Secondary | ICD-10-CM

## 2017-07-07 DIAGNOSIS — I472 Ventricular tachycardia, unspecified: Secondary | ICD-10-CM

## 2017-07-07 DIAGNOSIS — Z7984 Long term (current) use of oral hypoglycemic drugs: Secondary | ICD-10-CM | POA: Insufficient documentation

## 2017-07-07 DIAGNOSIS — Z95810 Presence of automatic (implantable) cardiac defibrillator: Secondary | ICD-10-CM | POA: Diagnosis not present

## 2017-07-07 DIAGNOSIS — R001 Bradycardia, unspecified: Secondary | ICD-10-CM | POA: Diagnosis not present

## 2017-07-07 DIAGNOSIS — E1122 Type 2 diabetes mellitus with diabetic chronic kidney disease: Secondary | ICD-10-CM | POA: Diagnosis not present

## 2017-07-07 DIAGNOSIS — I130 Hypertensive heart and chronic kidney disease with heart failure and stage 1 through stage 4 chronic kidney disease, or unspecified chronic kidney disease: Secondary | ICD-10-CM | POA: Diagnosis not present

## 2017-07-07 DIAGNOSIS — I5022 Chronic systolic (congestive) heart failure: Secondary | ICD-10-CM | POA: Insufficient documentation

## 2017-07-07 DIAGNOSIS — Z79899 Other long term (current) drug therapy: Secondary | ICD-10-CM | POA: Insufficient documentation

## 2017-07-07 DIAGNOSIS — M109 Gout, unspecified: Secondary | ICD-10-CM | POA: Insufficient documentation

## 2017-07-07 DIAGNOSIS — G4733 Obstructive sleep apnea (adult) (pediatric): Secondary | ICD-10-CM | POA: Diagnosis not present

## 2017-07-07 DIAGNOSIS — E039 Hypothyroidism, unspecified: Secondary | ICD-10-CM | POA: Diagnosis not present

## 2017-07-07 DIAGNOSIS — Z9884 Bariatric surgery status: Secondary | ICD-10-CM | POA: Diagnosis not present

## 2017-07-07 LAB — COMPREHENSIVE METABOLIC PANEL
ALK PHOS: 148 U/L — AB (ref 38–126)
ALT: 45 U/L (ref 14–54)
ANION GAP: 14 (ref 5–15)
AST: 52 U/L — ABNORMAL HIGH (ref 15–41)
Albumin: 4 g/dL (ref 3.5–5.0)
BUN: 33 mg/dL — ABNORMAL HIGH (ref 6–20)
CALCIUM: 9.3 mg/dL (ref 8.9–10.3)
CO2: 18 mmol/L — AB (ref 22–32)
CREATININE: 2.15 mg/dL — AB (ref 0.44–1.00)
Chloride: 106 mmol/L (ref 101–111)
GFR calc Af Amer: 29 mL/min — ABNORMAL LOW (ref >60)
GFR, EST NON AFRICAN AMERICAN: 25 mL/min — AB (ref >60)
Glucose, Bld: 107 mg/dL — ABNORMAL HIGH (ref 65–99)
Potassium: 3.9 mmol/L (ref 3.5–5.1)
SODIUM: 138 mmol/L (ref 135–145)
TOTAL PROTEIN: 7.2 g/dL (ref 6.5–8.1)
Total Bilirubin: 0.7 mg/dL (ref 0.3–1.2)

## 2017-07-07 LAB — TSH

## 2017-07-07 LAB — CBC
HCT: 39.8 % (ref 36.0–46.0)
Hemoglobin: 12.9 g/dL (ref 12.0–15.0)
MCH: 26.5 pg (ref 26.0–34.0)
MCHC: 32.4 g/dL (ref 30.0–36.0)
MCV: 81.7 fL (ref 78.0–100.0)
Platelets: 173 10*3/uL (ref 150–400)
RBC: 4.87 MIL/uL (ref 3.87–5.11)
RDW: 13.9 % (ref 11.5–15.5)
WBC: 4.5 10*3/uL (ref 4.0–10.5)

## 2017-07-07 NOTE — Telephone Encounter (Signed)
Remote ICM transmission received.  Attempted call to patient and no message. 

## 2017-07-07 NOTE — Patient Instructions (Signed)
Labs drawn today (if we do not call you, then your lab work was stable)   Your physician has requested that you have an echocardiogram. Echocardiography is a painless test that uses sound waves to create images of your heart. It provides your doctor with information about the size and shape of your heart and how well your heart's chambers and valves are working. This procedure takes approximately one hour. There are no restrictions for this procedure.  Your physician recommends that you schedule a follow-up appointment in: 3 months (July, 2019) with Dr. Aundra Dubin  an a echocardiogram Please Call an Schedule Appointment

## 2017-07-08 NOTE — Progress Notes (Signed)
PCP: Dr. Sherrie Sport HF Cardiology: Dr. Aundra Dubin  Ms Alcaraz is a 52 y.o. 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 At one point EF improved to 45% from 20%.  She also has a history VT with Medtronic ICD placed in [place]. In 12/17, she was  admitted to [facility] with new onset atrial fibrillation/RVR and volume overload. Required IV diuresis and cardizem drip.  She remained in atrial fibrillation.  Warfarin was started that admission.   On 03/26/16, she saw Dr Percival Spanish and she was set up TEE/DCCV. Creatinine at that time was 2.03.  She had had a functional decline and dyspnea at rest. Weight at home had been trending up from 290 to 309 pounds.  SBP in 90s at Dr. Rosezella [place] office.   On 04/08/16, she presented for scheduled TEE/DC-CV, however thrombus was noted in LA appendage so DC-CV was not pursued. TEE also showed severe LV dysfunction. Dyspneic at rest. SBP soft.  She was admitted and ultimately required initiation of milrinone gtt + diuresis with IV Lasix and metolazone.  She lost > 20 lbs.  We were able to titrate her off milrinone and she was sent home on torsemide.  Rate was difficult to control, Toprol XL was titrated up to 75 mg bid.   At appointment in 2/18, she was noted to be back in NSR.  She remains in NSR today.   She had gastric bypass in 3/19 at [facility].   She saw Dr. Rayann Heman for evaluation for possible afib ablation.  He recommended initial weight loss + amiodarone, then try to stop amiodarone after she has lost a fair amount of weight.   She returns for followup of CHF.  Weight is down 12 lbs.  She is using CPAP at night.  Overall, breathing is better.  Short of breath with a flight of steps but no problems walking on flat ground.  No orthopnea/PND.  No lightheadedness, palpitations.  No chest pain.    Labs (1/18): digoxin 0.5, K 4.5, creatinine 2.13, digoxin 1.7, AST 53, ALT 34, TSH elevated Labs (2/18): K 3.9, creatinine 1.98 =>  1.6, TSH 14, normal free T3 and free T4, LFTs normal, digoxin 1.7 Labs (3/18): K 3.8, creatinine 1.8, digoxin 1.0 Labs (12/18): K 4, creatinine 1.62 Labs (3/19): creatinine 2.06  ECG (personally reviewed): NSR at 56, anteroseptal Qs  Medtronic device interrogation: Impedance trending up, ok.    PMH: 1. Chronic systolic CHF: Nonischemic cardiomyopathy.  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  EF initially 20%, rose to 45% at one point.   - h/o VT => Medtronic ICD.  - Echo (1/18): EF 25-30%, mildly dilated RV with normal systolic function, moderate TR, PASP 31 mmHg.  - LHC/RHC (1/18): No CAD; mean RA 19, PA 46/20 mean 35, mean PCWP 24, CI 2.37 (Thermo), CI 3.16 (Fick), PVR 1.9 WU.  2. Atrial fibrillation: Persistent.  Diagnosed 12/17 when she presented to [facility] with afib/RVR.  TEE/DCCV set up in 1/18 but no DCCV due to LA thrombus on TEE.   3. Gout 4. Type II diabetes 5. HTN 6. OSA: Uses CPAP 7. CKD stage III: Has history of atrophic left kidney  8. Subclinical hypothyroidism 9. VT: 1/18, had ICD discharge.  10. Gastric bypass 3/19.   SH: Divorced, on disability, lives with parents in [place], nonsmoker, no ETOH, no drugs.   Family History  Problem Relation Age of Onset  . Other Mother  has chronic pain syndrome from DDD with spinal cord stimulator  . Hypertension Mother   . COPD Father   . Arthritis/Rheumatoid Father    ROS: All systems reviewed and negative except as per HPI.   Current Outpatient Medications  Medication Sig Dispense Refill  . amiodarone (PACERONE) 200 MG tablet TAKE ONE TABLET (200 MG) BY MOUTH EACH DAY 30 tablet 3  . apixaban (ELIQUIS) 5 MG TABS tablet Take 1 tablet (5 mg total) by mouth 2 (two) times daily. 180 tablet 3  . atorvastatin (LIPITOR) 20 MG tablet Take 20 mg by mouth daily.    . citalopram (CELEXA) 10 MG tablet Take 10 mg by mouth daily.     Marland Kitchen levothyroxine (SYNTHROID, LEVOTHROID) 150 MCG tablet Take 150 mcg  by mouth daily before breakfast.     . metFORMIN (GLUCOPHAGE) 500 MG tablet Take by mouth 2 (two) times daily with a meal.    . metoprolol succinate (TOPROL-XL) 100 MG 24 hr tablet TAKE ONE TABLET (100 MG) TWICE DAILY 60 tablet 6  . Multiple Vitamin (MULTIVITAMIN) tablet Take 1 tablet by mouth daily.    . sacubitril-valsartan (ENTRESTO) 24-26 MG Take 1 tablet by mouth 2 (two) times daily. 60 tablet 11  . spironolactone (ALDACTONE) 25 MG tablet TAKE ONE TABLET BY MOUTH EACH DAY 30 tablet 6  . torsemide (DEMADEX) 20 MG tablet Take 1 tablet (20 mg total) by mouth daily. 90 tablet 3   No current facility-administered medications for this encounter.    BP 126/84   Pulse 66   Wt 260 lb 12 oz (118.3 kg)   LMP 03/14/2014 Comment: neg preg test  SpO2 100%   BMI 40.84 kg/m  General: NAD Neck: No JVD, no thyromegaly or thyroid nodule.  Lungs: Clear to auscultation bilaterally with normal respiratory effort. CV: Nondisplaced PMI.  Heart regular S1/S2, no S3/S4, no murmur.  No peripheral edema.  No carotid bruit.  Normal pedal pulses.  Abdomen: Soft, nontender, no hepatosplenomegaly, no distention.  Skin: Intact without lesions or rashes.  Neurologic: Alert and oriented x 3.  Psych: Normal affect. Extremities: No clubbing or cyanosis.  HEENT: Normal.   Assessment/Plan: 1. Chronic systolic CHF: Echo 4/19 with EF 25-30%, nonischemic cardiomyopathy. Cardiomyopathy known since 102. Possibly peri-partum. SPEP negative.  Has Medtronic ICD. LHC (1/18) with no coronary disease.  She was admitted in 1/18 with extensive diuresis, required milrinone due to low output but was able to titrate off.  When RHC was done, cardiac output was ok off milrinone. NYHA class II symptoms now, she is not volume overloaded on exam or by Optivol.   - Continue torsemide 20 mg daily.  BMET today.  - Continue Toprol XL 100 mg bid.    - Continue spironolactone 25 daily.  - Continue Entresto 24/26 bid. If creatinine is lower  today, would increase Entresto to 49/51 bid.    - I will arrange for a repeat echo at followup in 3 months.  2. Atrial flutter/fibrillation:  Noted in 12/17.  TEE in 1/18 showed LA appendage thrombus. She is now on Eliquis. Suspect atrial fibrillation with RVR drove her last CHF decompensation.  She remains in NSR today on amiodarone.  - Continue amiodarone 200 mg daily, hopefully will hold her in NSR.  She has subclinical hypothyroidism and is taking Levoxyl.  Check LFTs today.  She will need regular eye exam.    - Continue Toprol XL 100 bid.   - Continue Eliquis, check CBC.  - She saw  Dr. Rayann Heman to discuss atrial fibrillation ablation.  He would like to see weight loss before attempting this.  If she loses considerable weight post-gastric bypass, I would aim to try her off amiodarone.  3. CKD: Stage III.  She has an atrophic left kidney.  Creatinine higher in 3/19, repeat BMET today. 4. H/o VT: Has ICD. 5. OSA: Encouraged to use CPAP.    Followup in 3 months with echo.  Loralie Champagne 07/08/2017

## 2017-07-10 DIAGNOSIS — Z9884 Bariatric surgery status: Secondary | ICD-10-CM | POA: Diagnosis not present

## 2017-07-10 DIAGNOSIS — E46 Unspecified protein-calorie malnutrition: Secondary | ICD-10-CM | POA: Diagnosis not present

## 2017-07-13 ENCOUNTER — Ambulatory Visit (INDEPENDENT_AMBULATORY_CARE_PROVIDER_SITE_OTHER): Payer: Self-pay

## 2017-07-13 DIAGNOSIS — Z6841 Body Mass Index (BMI) 40.0 and over, adult: Secondary | ICD-10-CM | POA: Diagnosis not present

## 2017-07-13 DIAGNOSIS — Z9884 Bariatric surgery status: Secondary | ICD-10-CM | POA: Diagnosis not present

## 2017-07-13 DIAGNOSIS — Z713 Dietary counseling and surveillance: Secondary | ICD-10-CM | POA: Diagnosis not present

## 2017-07-13 DIAGNOSIS — I5022 Chronic systolic (congestive) heart failure: Secondary | ICD-10-CM

## 2017-07-13 DIAGNOSIS — Z95810 Presence of automatic (implantable) cardiac defibrillator: Secondary | ICD-10-CM

## 2017-07-14 ENCOUNTER — Telehealth (HOSPITAL_COMMUNITY): Payer: Self-pay

## 2017-07-14 DIAGNOSIS — I5022 Chronic systolic (congestive) heart failure: Secondary | ICD-10-CM

## 2017-07-14 MED ORDER — TORSEMIDE 20 MG PO TABS: 20.0000 mg | ORAL_TABLET | ORAL | 3 refills | Status: DC

## 2017-07-14 NOTE — Telephone Encounter (Signed)
Notes recorded by Shirley Muscat, RN on 07/14/2017 at 5:06 PM EDT Pt aware of results, agreeable to med changes (changes made in [facility]). Lab order placed for AP per pt's request ------  Notes recorded by Shirley Muscat, RN on 07/09/2017 at 11:26 AM EDT -Left VM  -Sent TSH to PCP ------  Notes recorded by Larey Dresser, MD on 07/08/2017 at 12:16 AM EDT 1. TSH suppressed, likely too high Levoxyl dose. Send to PCP.  2. BUN/creatinine remain elevated. I would have her decrease torsemide to 20 mg every other day. Repeat BMET 1 week.

## 2017-07-14 NOTE — Progress Notes (Signed)
EPIC Encounter for ICM Monitoring  Patient Name: Erin Jarvis is a 52 y.o. female Date: 07/14/2017 Primary Care Physican: [name], MD Primary Cardiologist:Hochrein/McLean Electrophysiologist: Allred Dry Weight: 259lbs        Heart Failure questions reviewed, pt asymptomatic.  Gastric by pass surgery was on 06/16/2017.   Thoracic impedance abnormal suggesting fluid accumulation since 07/08/2017.  Impedance with multiple peaks and valleys.  Prescribed dosage: Torsemide20 mg 1 tabletevery other day (decreased from 1 tab daily by Dr Claris Gladden office today, 07/14/2017).   Labs: 07/07/2017 Creatinine 2.15, BUN 33, Potassium 3.9, Sodium 138, EGFR 25-29 06/11/2017 Creatinine 2.06, BUN 53, Potassium 4.3, Sodium 135, EGFR 27-31 03/27/2017 Creatinine 1.62, BUN 45, Potassium 4.0, Sodium 137, EGFR 35-41 02/26/2017 Creatinine 1.67, BUN 39, Potassium 4.5, Sodium 138, EGFR 34-39 02/18/2017 Creatinine 2.37, BUN 71, Potassium 4.3, Sodium 135, EGFR 22-26 12/26/2016 Creatinine 1.72, BUN 45, Potassium 3.3, Sodium 137, EGFR 33-38 12/05/2016 Creatinine 2.20, BUN 62, Potassium 4.0, Sodium 136, EGFR 24-28 10/23/2016 Creatinine 1.66, BUN 46, Potassium 3.6, Sodium 137, EGFR 34-40 09/19/2016 Creatinine 1.71, BUN 37, Potassium 4.2, Sodium 136, EGFR 33-38 08/21/2016 Creatinine 1.85, BUN 45, Potassium 3.9, Sodium 137, EGFR 30-35  06/13/2016 Creatinine 1.68, BUN 43, Potassium 4.4, Sodium 139, EGFR 34-39  05/14/2016 Creatinine 1.60, BUN 22, Potassium 3.8, Sodium 140, EGFR 36-41 05/02/2016 Creatinine 1.98, BUN 54, Potassium 3.9, Sodium 139, EGFR 28-33 04/22/2016 Creatinine 2.22, BUN 81, Potassium 4.4, Sodium 136, EGFR 24-28 01/15/2018Creatinine 2.13, BUN 45, Potassium 4.5, Sodium 135, EGFR 25-29  01/14/2018Creatinine 1.86, BUN 37, Potassium 4.4, Sodium 133, EGFR 30-35  01/13/2018Creatinine 1.70, BUN 31, Potassium 4.3, Sodium 137, EGFR 33-39  01/12/2018Creatinine 1.62, BUN 30, Potassium 4.2,  Sodium 138, EGFR 35-41  01/11/2018Creatinine 1.59, BUN 40, Potassium 3.4, Sodium 137, EGFR 36-42  01/10/2018Creatinine 1.90, BUN 51, Potassium 4.4, Sodium 137, EGFR 29-34  Recommendations:  Copy of ICM check sent to Dr. Rayann Heman and Dr. Aundra Dubin for review and recommendations if needed.     Follow-up plan: ICM clinic phone appointment on 07/21/2017 to recheck fluid levels.    3 month ICM trend: 07/13/2017    1 Year ICM trend:       [name], RN 07/14/2017 2:57 PM

## 2017-07-17 NOTE — Progress Notes (Signed)
No change for now, recheck next week.

## 2017-07-21 ENCOUNTER — Ambulatory Visit (INDEPENDENT_AMBULATORY_CARE_PROVIDER_SITE_OTHER): Payer: Self-pay

## 2017-07-21 ENCOUNTER — Telehealth: Payer: Self-pay

## 2017-07-21 DIAGNOSIS — Z95810 Presence of automatic (implantable) cardiac defibrillator: Secondary | ICD-10-CM

## 2017-07-21 DIAGNOSIS — I5022 Chronic systolic (congestive) heart failure: Secondary | ICD-10-CM

## 2017-07-21 NOTE — Telephone Encounter (Signed)
Remote ICM transmission received.  Attempted call to patient and left message to return call. 

## 2017-07-21 NOTE — Progress Notes (Signed)
EPIC Encounter for ICM Monitoring  Patient Name: Erin Jarvis is a 52 y.o. female Date: 07/21/2017 Primary Care Physican: [name], MD Primary Cardiologist:Hochrein/McLean Electrophysiologist: Allred Dry Weight:259lbs      Patient is asymptomatic.   Bariatric surgery was on 06/16/2017. Weight stable.    Recheck of thoracic impedance shows it continues to be abnormal suggesting fluid accumulation since 07/08/2017.  Impedance with multiple peaks and valleys.  Prescribed dosage: Torsemide20 mg 1 tabletevery other day (decreased from 1 tab daily by Dr Claris Gladden on 07/14/2017).  Confirmed she is taking Torsemide every other day as prescribed.   Labs: 07/10/2017 Creatinine 1.50, BUN 27, Potassium 4.2, Sodium 140, EGFR 39-45 (Care Everywhere) 07/07/2017 Creatinine 2.15, BUN 33, Potassium 3.9, Sodium 138, EGFR 25-29 06/11/2017 Creatinine 2.06, BUN 53, Potassium 4.3, Sodium 135, EGFR 27-31 03/27/2017 Creatinine 1.62, BUN 45, Potassium 4.0, Sodium 137, EGFR 35-41 02/26/2017 Creatinine 1.67, BUN 39, Potassium 4.5, Sodium 138, EGFR 34-39 02/18/2017 Creatinine 2.37, BUN 71, Potassium 4.3, Sodium 135, EGFR 22-26 12/26/2016 Creatinine 1.72, BUN 45, Potassium 3.3, Sodium 137, EGFR 33-38 12/05/2016 Creatinine 2.20, BUN 62, Potassium 4.0, Sodium 136, EGFR 24-28 10/23/2016 Creatinine 1.66, BUN 46, Potassium 3.6, Sodium 137, EGFR 34-40 09/19/2016 Creatinine 1.71, BUN 37, Potassium 4.2, Sodium 136, EGFR 33-38 08/21/2016 Creatinine 1.85, BUN 45, Potassium 3.9, Sodium 137, EGFR 30-35  06/13/2016 Creatinine 1.68, BUN 43, Potassium 4.4, Sodium 139, EGFR 34-39  05/14/2016 Creatinine 1.60, BUN 22, Potassium 3.8, Sodium 140, EGFR 36-41 05/02/2016 Creatinine 1.98, BUN 54, Potassium 3.9, Sodium 139, EGFR 28-33  Recommendations:  Copy of ICM check sent to Dr. Rayann Heman and Dr. Aundra Dubin for review and recommendations if needed.   Follow-up plan: ICM clinic phone appointment on 07/30/2017 (manual send) to  recheck fluid levels.   3 month ICM trend: 07/21/2017    1 Year ICM trend:       [name], RN 07/21/2017 10:52 AM

## 2017-07-22 ENCOUNTER — Other Ambulatory Visit (HOSPITAL_COMMUNITY)
Admission: RE | Admit: 2017-07-22 | Discharge: 2017-07-22 | Disposition: A | Discharge status: Home / Self Care | Payer: Medicare Other | Source: Ambulatory Visit | Attending: Cardiology | Admitting: Cardiology

## 2017-07-22 ENCOUNTER — Telehealth: Payer: Self-pay | Admitting: *Deleted

## 2017-07-22 ENCOUNTER — Other Ambulatory Visit (HOSPITAL_COMMUNITY): Payer: Medicare Other

## 2017-07-22 DIAGNOSIS — I5022 Chronic systolic (congestive) heart failure: Secondary | ICD-10-CM | POA: Insufficient documentation

## 2017-07-22 LAB — BASIC METABOLIC PANEL
Anion gap: 13 (ref 5–15)
BUN: 40 mg/dL — ABNORMAL HIGH (ref 6–20)
CHLORIDE: 105 mmol/L (ref 101–111)
CO2: 20 mmol/L — ABNORMAL LOW (ref 22–32)
Calcium: 9.4 mg/dL (ref 8.9–10.3)
Creatinine, Ser: 2.08 mg/dL — ABNORMAL HIGH (ref 0.44–1.00)
GFR calc non Af Amer: 26 mL/min — ABNORMAL LOW (ref >60)
GFR, EST AFRICAN AMERICAN: 30 mL/min — AB (ref >60)
Glucose, Bld: 150 mg/dL — ABNORMAL HIGH (ref 65–99)
POTASSIUM: 3.3 mmol/L — AB (ref 3.5–5.1)
SODIUM: 138 mmol/L (ref 135–145)

## 2017-07-22 MED ORDER — TORSEMIDE 20 MG PO TABS: 20.0000 mg | ORAL_TABLET | Freq: Every day | ORAL | 11 refills | Status: DC

## 2017-07-22 NOTE — Progress Notes (Signed)
Increase torsemide back to 20 mg daily.

## 2017-07-22 NOTE — Telephone Encounter (Signed)
Erin Jarvis made aware to increase torsemide 20mg  daily. She reports she had labs drawn this morning. Rx sent to [facility]. Next ICM transmission 07/30/17.

## 2017-07-22 NOTE — Telephone Encounter (Signed)
LMOM to return call to [facility] regarding ICM transmission.  Recommendations received from Dr. Aundra Dubin- increase torsemide 20mg  to daily (rather than 20mg  every other day).

## 2017-07-22 NOTE — Telephone Encounter (Signed)
-----   Message from Larey Dresser, MD sent at 07/22/2017  2:31 AM EDT -----   ----- Message ----- From: [name], RN Sent: 07/21/2017  12:58 PM To: Larey Dresser, MD

## 2017-07-23 ENCOUNTER — Telehealth (HOSPITAL_COMMUNITY): Payer: Self-pay

## 2017-07-23 DIAGNOSIS — I5022 Chronic systolic (congestive) heart failure: Secondary | ICD-10-CM

## 2017-07-23 MED ORDER — POTASSIUM CHLORIDE CRYS ER 20 MEQ PO TBCR: 20.0000 meq | EXTENDED_RELEASE_TABLET | Freq: Every day | ORAL | 3 refills | Status: DC

## 2017-07-23 NOTE — Progress Notes (Signed)
Received: Yesterday  Message Contents  Long, Susette Racer, RN  Short, Laurie Panda, RN        McLean increased her torsemide to 20mg  daily rather than EOD. She is aware- sent Rx to preferred pharmacy.

## 2017-07-23 NOTE — Telephone Encounter (Signed)
Notes recorded by Shirley Muscat, RN on 07/23/2017 at 12:18 PM EDT Pt aware of results, agreeable to med changes (changes made in [facility]), and lab order placed for AP ------  Notes recorded by Shirley Muscat, RN on 07/23/2017 at 9:13 AM EDT Left VM  ------  Notes recorded by Larey Dresser, MD on 07/23/2017 at 2:33 AM EDT She needs an additional 20 mEq KCl daily. Repeat BMET 2 wks

## 2017-07-30 ENCOUNTER — Ambulatory Visit (INDEPENDENT_AMBULATORY_CARE_PROVIDER_SITE_OTHER): Payer: Self-pay

## 2017-07-30 DIAGNOSIS — Z95810 Presence of automatic (implantable) cardiac defibrillator: Secondary | ICD-10-CM

## 2017-07-30 DIAGNOSIS — I5022 Chronic systolic (congestive) heart failure: Secondary | ICD-10-CM

## 2017-07-30 NOTE — Progress Notes (Signed)
EPIC Encounter for ICM Monitoring  Patient Name: Erin Jarvis is a 52 y.o. female Date: 07/30/2017 Primary Care Physican: Hasanaj, Xaje A, MD Primary Cardiologist:Hochrein/McLean Electrophysiologist: Allred Dry Weight:254lbs        Heart Failure questions reviewed, pt asymptomatic.  Lost 4 pounds since 07/21/2017 transmission in response to change in Torsemide dosage.   Thoracic impedance returned to normal after change in Torsemide maintenance dose to 20 mg daily.   Prescribed dosage: Torsemide20 mg 1 tablet daily.  Potassium 20 mEq 1 tablet daily.  Labs: 07/22/2017 Creatinine 2.08, BUN 40, Potassium 3.3, Sodium 138, EGFR 26-30 07/10/2017 Creatinine 1.50, BUN 27, Potassium 4.2, Sodium 140, EGFR 39-45 (Care Everywhere) 07/07/2017 Creatinine 2.15, BUN 33, Potassium 3.9, Sodium 138, EGFR 25-29 06/11/2017 Creatinine 2.06, BUN 53, Potassium 4.3, Sodium 135, EGFR 27-31 03/27/2017 Creatinine 1.62, BUN 45, Potassium 4.0, Sodium 137, EGFR 35-41 02/26/2017 Creatinine 1.67, BUN 39, Potassium 4.5, Sodium 138, EGFR 34-39 02/18/2017 Creatinine 2.37, BUN 71, Potassium 4.3, Sodium 135, EGFR 22-26 12/26/2016 Creatinine 1.72, BUN 45, Potassium 3.3, Sodium 137, EGFR 33-38 12/05/2016 Creatinine 2.20, BUN 62, Potassium 4.0, Sodium 136, EGFR 24-28 10/23/2016 Creatinine 1.66, BUN 46, Potassium 3.6, Sodium 137, EGFR 34-40 09/19/2016 Creatinine 1.71, BUN 37, Potassium 4.2, Sodium 136, EGFR 33-38 08/21/2016 Creatinine 1.85, BUN 45, Potassium 3.9, Sodium 137, EGFR 30-35  06/13/2016 Creatinine 1.68, BUN 43, Potassium 4.4, Sodium 139, EGFR 34-39  05/14/2016 Creatinine 1.60, BUN 22, Potassium 3.8, Sodium 140, EGFR 36-41 05/02/2016 Creatinine 1.98, BUN 54, Potassium 3.9, Sodium 139, EGFR 28-33  Recommendations: No changes.  Advised she has BMET order for 08/06/2017 per HF clinic.  Encouraged to call for fluid symptoms.  Follow-up plan: ICM clinic phone appointment on 08/20/2017.    Copy of ICM check  sent to Dr. Allred and Dr. McLean.   3 month ICM trend: 07/30/2017    1 Year ICM trend:       Laurie S Short, RN 07/30/2017 12:42 PM   

## 2017-08-06 ENCOUNTER — Ambulatory Visit (INDEPENDENT_AMBULATORY_CARE_PROVIDER_SITE_OTHER): Payer: Medicare Other | Admitting: *Deleted

## 2017-08-06 DIAGNOSIS — I472 Ventricular tachycardia, unspecified: Secondary | ICD-10-CM

## 2017-08-06 NOTE — Progress Notes (Signed)
Remote ICD transmission.   

## 2017-08-11 ENCOUNTER — Encounter: Payer: Self-pay | Admitting: Cardiology

## 2017-08-13 ENCOUNTER — Telehealth (HOSPITAL_COMMUNITY): Payer: Self-pay | Admitting: *Deleted

## 2017-08-13 NOTE — Telephone Encounter (Signed)
Received message from [facility] that they are out of Torsemide at this time.  I have called patient and left VM letting her know I can send refill to [facility] if needed.  Will wait for her to return our call before sending.

## 2017-08-14 MED ORDER — TORSEMIDE 20 MG PO TABS: 20.0000 mg | ORAL_TABLET | Freq: Every day | ORAL | 11 refills | Status: DC

## 2017-08-14 MED FILL — TORSEMIDE 20 MG TABLET: 20 | 30 days supply | Qty: 30 | Fill #0

## 2017-08-14 NOTE — Telephone Encounter (Signed)
Pt called back, she states that she is able to come to the [facility] pharmacy to get Torsemide, RX sent in

## 2017-08-20 ENCOUNTER — Ambulatory Visit (INDEPENDENT_AMBULATORY_CARE_PROVIDER_SITE_OTHER): Payer: Medicare Other

## 2017-08-20 DIAGNOSIS — Z95810 Presence of automatic (implantable) cardiac defibrillator: Secondary | ICD-10-CM

## 2017-08-20 DIAGNOSIS — I5022 Chronic systolic (congestive) heart failure: Secondary | ICD-10-CM | POA: Diagnosis not present

## 2017-08-21 ENCOUNTER — Telehealth: Payer: Self-pay

## 2017-08-21 NOTE — Telephone Encounter (Signed)
Remote ICM transmission received.  Attempted call to patient and left message to return call. 

## 2017-08-21 NOTE — Progress Notes (Signed)
EPIC Encounter for ICM Monitoring  Patient Name: Erin Jarvis is a 52 y.o. female Date: 08/21/2017 Primary Care Physican: [name], MD Primary Cardiologist:Hochrein/McLean Electrophysiologist: Allred Dry Weight:Previous weight 254lbs      Spoke with patient and denied any fluid symptoms.      Thoracic impedance normal.  Prescribed dosage: Torsemide20 mg 1 tablet daily. Potassium 20 mEq 1 tablet daily.  Labs: 07/22/2017 Creatinine 2.08, BUN 40, Potassium 3.3, Sodium 138, EGFR 26-30 07/10/2017 Creatinine 1.50, BUN 27, Potassium 4.2, Sodium 140, EGFR 39-45 (Care Everywhere) 07/07/2017 Creatinine 2.15, BUN 33, Potassium 3.9, Sodium 138, EGFR 25-29 06/11/2017 Creatinine 2.06, BUN 53, Potassium 4.3, Sodium 135, EGFR 27-31 03/27/2017 Creatinine 1.62, BUN 45, Potassium 4.0, Sodium 137, EGFR 35-41 02/26/2017 Creatinine 1.67, BUN 39, Potassium 4.5, Sodium 138, EGFR 34-39 02/18/2017 Creatinine 2.37, BUN 71, Potassium 4.3, Sodium 135, EGFR 22-26 12/26/2016 Creatinine 1.72, BUN 45, Potassium 3.3, Sodium 137, EGFR 33-38 12/05/2016 Creatinine 2.20, BUN 62, Potassium 4.0, Sodium 136, EGFR 24-28 10/23/2016 Creatinine 1.66, BUN 46, Potassium 3.6, Sodium 137, EGFR 34-40 09/19/2016 Creatinine 1.71, BUN 37, Potassium 4.2, Sodium 136, EGFR 33-38 08/21/2016 Creatinine 1.85, BUN 45, Potassium 3.9, Sodium 137, EGFR 30-35  06/13/2016 Creatinine 1.68, BUN 43, Potassium 4.4, Sodium 139, EGFR 34-39  05/14/2016 Creatinine 1.60, BUN 22, Potassium 3.8, Sodium 140, EGFR 36-41 05/02/2016 Creatinine 1.98, BUN 54, Potassium 3.9, Sodium 139, EGFR 28-33  Recommendations: No changes.    Follow-up plan: ICM clinic phone appointment on 09/21/2017.    Copy of ICM check sent to Dr. Rayann Heman.   3 month ICM trend: 08/21/2017    1 Year ICM trend:       [name], RN 08/21/2017 9:38 AM

## 2017-09-01 DIAGNOSIS — K912 Postsurgical malabsorption, not elsewhere classified: Secondary | ICD-10-CM | POA: Diagnosis not present

## 2017-09-01 DIAGNOSIS — Z9884 Bariatric surgery status: Secondary | ICD-10-CM | POA: Diagnosis not present

## 2017-09-01 DIAGNOSIS — Z713 Dietary counseling and surveillance: Secondary | ICD-10-CM | POA: Diagnosis not present

## 2017-09-01 DIAGNOSIS — E569 Vitamin deficiency, unspecified: Secondary | ICD-10-CM | POA: Diagnosis not present

## 2017-09-01 DIAGNOSIS — Z01818 Encounter for other preprocedural examination: Secondary | ICD-10-CM | POA: Diagnosis not present

## 2017-09-01 DIAGNOSIS — Z6841 Body Mass Index (BMI) 40.0 and over, adult: Secondary | ICD-10-CM | POA: Diagnosis not present

## 2017-09-03 LAB — CUP PACEART REMOTE DEVICE CHECK
Date Time Interrogation Session: 20190509073427
HIGH POWER IMPEDANCE MEASURED VALUE: 67 Ohm
HighPow Impedance: 81 Ohm
Implantable Lead Implant Date: 20100824
Implantable Lead Location: 753860
Implantable Lead Model: 6947
Lead Channel Impedance Value: 627 Ohm
Lead Channel Pacing Threshold Amplitude: 1.5 V
Lead Channel Sensing Intrinsic Amplitude: 2.5 mV
MDC IDC MSMT BATTERY VOLTAGE: 2.64 V
MDC IDC MSMT LEADCHNL RV PACING THRESHOLD PULSEWIDTH: 0.4 ms
MDC IDC MSMT LEADCHNL RV SENSING INTR AMPL: 2.5 mV
MDC IDC PG IMPLANT DT: 20100824
MDC IDC SET LEADCHNL RV PACING AMPLITUDE: 4.25 V
MDC IDC SET LEADCHNL RV PACING PULSEWIDTH: 0.4 ms
MDC IDC SET LEADCHNL RV SENSING SENSITIVITY: 0.3 mV
MDC IDC STAT BRADY RV PERCENT PACED: 0.03 %

## 2017-09-18 DIAGNOSIS — I509 Heart failure, unspecified: Secondary | ICD-10-CM | POA: Diagnosis not present

## 2017-09-18 DIAGNOSIS — N189 Chronic kidney disease, unspecified: Secondary | ICD-10-CM | POA: Diagnosis not present

## 2017-09-18 DIAGNOSIS — Z9884 Bariatric surgery status: Secondary | ICD-10-CM | POA: Diagnosis not present

## 2017-09-18 DIAGNOSIS — N289 Disorder of kidney and ureter, unspecified: Secondary | ICD-10-CM | POA: Diagnosis not present

## 2017-09-21 ENCOUNTER — Ambulatory Visit (INDEPENDENT_AMBULATORY_CARE_PROVIDER_SITE_OTHER): Payer: Medicare Other

## 2017-09-21 DIAGNOSIS — I5022 Chronic systolic (congestive) heart failure: Secondary | ICD-10-CM | POA: Diagnosis not present

## 2017-09-21 DIAGNOSIS — Z95810 Presence of automatic (implantable) cardiac defibrillator: Secondary | ICD-10-CM | POA: Diagnosis not present

## 2017-09-21 NOTE — Progress Notes (Signed)
EPIC Encounter for ICM Monitoring  Patient Name: Erin Jarvis is a 52 y.o. female Date: 09/21/2017 Primary Care Physican: [name], MD Primary Cardiologist:Hochrein/McLean Electrophysiologist: Allred Nephrologist: Dr Hollie Salk at [facility] Failure questions reviewed, pt asymptomatic.  Received IV fluids on 09/18/2017 due to dehydration and kidney function was decreased.  She has less fluid intake after having Gastric ByPass on 06/16/2017.   Patient reported the nephrologist may need to perform temporary dialysis depending on how the kidneys are functioning.   Thoracic impedance abnormal suggesting fluid accumulation starting 08/12/2017 with a couple of days close to baseline.  Fluid Index > threshold starting 08/24/2017.  Prescribed dosage: Torsemide20 mg 1tablet daily. Potassium 20 mEq 1 tablet daily.  Labs: 09/18/2017 Creatinine 2.64, BUN 51, Potassium 3.8, Sodium 141, EGFR 20-23 09/01/2017 Creatinine 3.43, BUN 65, Potassium 3.5, Sodium 142, EGFR 14-17 07/22/2017 Creatinine 2.08, BUN 40, Potassium 3.3, Sodium 138, EGFR 26-30 07/10/2017 Creatinine 1.50, BUN 27, Potassium 4.2, Sodium 140, EGFR 39-45 (Care Everywhere) 07/07/2017 Creatinine 2.15, BUN 33, Potassium 3.9, Sodium 138, EGFR 25-29 06/11/2017 Creatinine 2.06, BUN 53, Potassium 4.3, Sodium 135, EGFR 27-31 03/27/2017 Creatinine 1.62, BUN 45, Potassium 4.0, Sodium 137, EGFR 35-41 02/26/2017 Creatinine 1.67, BUN 39, Potassium 4.5, Sodium 138, EGFR 34-39 02/18/2017 Creatinine 2.37, BUN 71, Potassium 4.3, Sodium 135, EGFR 22-26 12/26/2016 Creatinine 1.72, BUN 45, Potassium 3.3, Sodium 137, EGFR 33-38 12/05/2016 Creatinine 2.20, BUN 62, Potassium 4.0, Sodium 136, EGFR 24-28 10/23/2016 Creatinine 1.66, BUN 46, Potassium 3.6, Sodium 137, EGFR 34-40  Recommendations:  Pt reported her nephrologist stopped Entresto, Torsemide and Spirolactone on 09/18/2017 until her return visit on 09/24/2017.   She received 500 cc of IV fluids on 09/18/2017  Follow-up plan: ICM clinic phone appointment on 09/29/2017 to recheck fluid levels.  Office appointment scheduled 01/01/2018 with Dr. Rayann Heman.   Recall appt 10/05/2017 with Dr. Aundra Dubin and advised to call for an appointment.  Copy of ICM check sent to Dr. Rayann Heman and Dr. Aundra Dubin.   3 month ICM trend: 09/21/2017    1 Year ICM trend:       [name], RN 09/21/2017 11:50 AM

## 2017-09-22 ENCOUNTER — Telehealth (HOSPITAL_COMMUNITY): Payer: Self-pay

## 2017-09-22 NOTE — Progress Notes (Signed)
PCP: Dr. Sherrie Sport HF Cardiology: Dr. Aundra Dubin  Ms Kutter is a 52 y.o. 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 At one point EF improved to 45% from 20%.  She also has a history VT with Medtronic ICD placed in [place]. In 12/17, she was  admitted to [facility] with new onset atrial fibrillation/RVR and volume overload. Required IV diuresis and cardizem drip.  She remained in atrial fibrillation.  Warfarin was started that admission.   On 03/26/16, she saw Dr Percival Spanish and she was set up TEE/DCCV. Creatinine at that time was 2.03.  She had had a functional decline and dyspnea at rest. Weight at home had been trending up from 290 to 309 pounds.  SBP in 90s at Dr. Rosezella [place] office.   On 04/08/16, she presented for scheduled TEE/DC-CV, however thrombus was noted in LA appendage so DC-CV was not pursued. TEE also showed severe LV dysfunction. Dyspneic at rest. SBP soft.  She was admitted and ultimately required initiation of milrinone gtt + diuresis with IV Lasix and metolazone.  She lost > 20 lbs.  We were able to titrate her off milrinone and she was sent home on torsemide.  Rate was difficult to control, Toprol XL was titrated up to 75 mg bid.   At appointment in 2/18, she was noted to be back in NSR.  She remains in NSR today.   She had gastric bypass in 3/19 at [facility].   She saw Dr. Rayann Heman for evaluation for possible afib ablation.  He recommended initial weight loss + amiodarone, then try to stop amiodarone after she has lost a fair amount of weight.   She returns today as an add on for volume overload. She had an appointment recently with nephrology with elevated creatinine to 2.6, so her torsemide, entresto, and spiro were held and she was given 500 cc bolus. She has been off these meds since 6/21. Her optivol reading indicates volume overload, so we restarted her torsemide 20 mg daily. She has taken two doses of torsemide. Overall she is doing  okay. She is concerned about her kidneys and her reading of volume overload. Typically when she is volume overloaded, she experiences abdominal bloating. She has mild abd bloating today. No edema. Denies any SOB, orthopnea, or PND. No dizziness, CP, or palpitations. Her mouth was dry and skin was tenting prior to holding diuretics. Decreased intake due to recent bypass surgery. She is also having trouble swallowing solid foods. She is still urinating clear to yellow urine. Weights are 241 lbs at home, trending down.   Medtronic device interrogation: Optivol well above threshold. Thoracic impedence below theshold and trending down, indicating volume overload since end of May. Active 1-2 hours/day.   Labs (1/18): digoxin 0.5, K 4.5, creatinine 2.13, digoxin 1.7, AST 53, ALT 34, TSH elevated Labs (2/18): K 3.9, creatinine 1.98 => 1.6, TSH 14, normal free T3 and free T4, LFTs normal, digoxin 1.7 Labs (3/18): K 3.8, creatinine 1.8, digoxin 1.0 Labs (12/18): K 4, creatinine 1.62 Labs (3/19): creatinine 2.06  PMH: 1. Chronic systolic CHF: Nonischemic cardiomyopathy.  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  EF initially 20%, rose to 45% at one point.   - h/o VT => Medtronic ICD.  - Echo (1/18): EF 25-30%, mildly dilated RV with normal systolic function, moderate TR, PASP 31 mmHg.  - LHC/RHC (1/18): No CAD; mean RA 19, PA 46/20 mean 35, mean PCWP 24, CI 2.37 (Thermo), CI 3.16 (  Fick), PVR 1.9 WU.  2. Atrial fibrillation: Persistent.  Diagnosed 12/17 when she presented to [facility] with afib/RVR.  TEE/DCCV set up in 1/18 but no DCCV due to LA thrombus on TEE.   3. Gout 4. Type II diabetes 5. HTN 6. OSA: Uses CPAP 7. CKD stage III: Has history of atrophic left kidney  8. Subclinical hypothyroidism 9. VT: 1/18, had ICD discharge.  10. Gastric bypass 3/19.   SH: Divorced, on disability, lives with parents in [place], nonsmoker, no ETOH, no drugs.   Family History  Problem  Relation Age of Onset  . Other Mother        has chronic pain syndrome from DDD with spinal cord stimulator  . Hypertension Mother   . COPD Father   . Arthritis/Rheumatoid Father    Review of systems complete and found to be negative unless listed in HPI.   Current Outpatient Medications  Medication Sig Dispense Refill  . amiodarone (PACERONE) 200 MG tablet TAKE ONE TABLET (200 MG) BY MOUTH EACH DAY 30 tablet 3  . apixaban (ELIQUIS) 5 MG TABS tablet Take 1 tablet (5 mg total) by mouth 2 (two) times daily. 180 tablet 3  . atorvastatin (LIPITOR) 20 MG tablet Take 20 mg by mouth daily.    . citalopram (CELEXA) 10 MG tablet Take 10 mg by mouth daily.     Marland Kitchen levothyroxine (SYNTHROID, LEVOTHROID) 150 MCG tablet Take 150 mcg by mouth daily before breakfast.     . metFORMIN (GLUCOPHAGE) 500 MG tablet Take by mouth 2 (two) times daily with a meal.    . metoprolol succinate (TOPROL-XL) 100 MG 24 hr tablet TAKE ONE TABLET (100 MG) TWICE DAILY 60 tablet 6  . Multiple Vitamin (MULTIVITAMIN) tablet Take 1 tablet by mouth daily.    . potassium chloride SA (K-DUR,KLOR-CON) 20 MEQ tablet Take 1 tablet (20 mEq total) by mouth daily. 30 tablet 3  . sacubitril-valsartan (ENTRESTO) 24-26 MG Take 1 tablet by mouth 2 (two) times daily. (Patient not taking: Reported on 09/23/2017) 60 tablet 11  . spironolactone (ALDACTONE) 25 MG tablet TAKE ONE TABLET BY MOUTH EACH DAY (Patient not taking: Reported on 09/23/2017) 30 tablet 6  . torsemide (DEMADEX) 20 MG tablet Take 1 tablet (20 mg total) by mouth daily. (Patient not taking: Reported on 09/23/2017) 30 tablet 11   No current facility-administered medications for this encounter.    BP 132/88   Pulse (!) 58   Wt 239 lb 12.8 oz (108.8 kg)   LMP 03/14/2014 Comment: neg preg test  SpO2 97%   BMI 37.56 kg/m      Vitals:   09/23/17 1344  BP: 132/88  Pulse: (!) 58  SpO2: 97%    Wt Readings from Last 3 Encounters:  09/23/17 239 lb 12.8 oz (108.8 kg)  07/07/17  260 lb 12 oz (118.3 kg)  02/18/17 272 lb 12.8 oz (123.7 kg)    General: No resp difficulty. HEENT: Normal Neck: Supple. JVP flat. Carotids 2+ bilat; no bruits. No thyromegaly or nodule noted. Cor: PMI nondisplaced. RRR, No M/G/R noted Lungs: CTAB, normal effort. Abdomen: obese, soft, non-tender, non-distended, no HSM. No bruits or masses. +BS  Extremities: No cyanosis, clubbing, or rash. R and LLE no edema.  Neuro: Alert & orientedx3, cranial nerves grossly intact. moves all 4 extremities w/o difficulty. Affect pleasant   Assessment/Plan: 1. Chronic systolic CHF: Echo 3/14 with EF 25-30%, nonischemic cardiomyopathy. Cardiomyopathy known since 67. Possibly peri-partum. SPEP negative.  Has Medtronic ICD. LHC (  1/18) with no coronary disease.  She was admitted in 1/18 with extensive diuresis, required milrinone due to low output but was able to titrate off.  When RHC was done, cardiac output was ok off milrinone.  - NYHA class II symptoms  - Volume status stable to dry on exam. Optivol indicates extreme volume overload, but does not correlate with her presentation. Weight is down 21 lbs in 2 months (gastric bypass). Will check BNP and BMET today.  - Continue to hold torsemide for now. Okay to take as needed for worsening abdominal bloating.  - Continue Toprol XL 100 mg bid.    Arlyce Harman and Delene Loll on hold with AKI. Creatinine up to 2.6 last week. Recheck BMET today.  - Repeat Echo with Dr Aundra Dubin 2. Atrial flutter/fibrillation:  Noted in 12/17.  TEE in 1/18 showed LA appendage thrombus. She is now on Eliquis. Suspect atrial fibrillation with RVR drove her last CHF decompensation.  She remains in NSR today on amiodarone.  - Continue amiodarone 200 mg daily, hopefully will hold her in NSR.  TSH elevated 06/2017 -> message sent to PCP for further management. AST mildly elevated 52, ALT normal 06/2017 - Continue Toprol XL 100 bid.   - Continue Eliquis. Denies bleeding.  - She saw Dr. Rayann Heman to  discuss atrial fibrillation ablation.  He would like to see weight loss before attempting this.  If she loses considerable weight post-gastric bypass, I would aim to try her off amiodarone.  3. CKD: Stage III.  She has an atrophic left kidney. Baseline creatinine ~1.6-2.0 - Creatinine up to 2.6 at Nephrologist appointment. Received 500 cc bolus and spiro, entresto, and torsemide were held.  - BMET today. She sees Dr Hollie Salk again tomorrow. Will forward results to her.  4. H/o VT: Has ICD. No VT/VF on interrogation 5. OSA: Encouraged to use CPAP nightly. No change.  6. HTN - Elevated today, but gets SBP 100s on home readings   Continue to hold spiro, Entresto, and torsemide for now. Check BMET and BNP today. Will forward results to Dr Hollie Salk.  Follow up with pharmacy in 4 weeks to see if we can add back any medications. Set up echo with Dr Aundra Dubin when available.   Georgiana Shore 09/23/2017  Greater than 50% of the 25 minute visit was spent in counseling/coordination of care regarding disease state education, salt/fluid restriction, sliding scale diuretics, and medication compliance.

## 2017-09-22 NOTE — Telephone Encounter (Signed)
Patient left VM on CHF clinic triage line yesterday afternoon c/o increased optivol/threshold per ICM clinic. In reviewing noted, patient's nephrologist (Dr. Hollie Salk at [facility]) stopped entresto, torsemide (20 mg once daily), and Spiro for elevated cr/bun (2.64/51).  Also gave 500 cc NS bolus for suspected dehydration.  However, per optivol fluid was trending up at this time. Per Caryl Pina, patient needs to restart torsemide at 20 mg once daily and come in to APP clinic for stat bmet and exam. Attempted to return call to patient, no answer, VM left with urgency to return call to schedule this appt.  Renee Pain, RN

## 2017-09-22 NOTE — Telephone Encounter (Signed)
Patient returned call to clinic stating she is able to come for appt tomorrow. Patient scheduled and advised per Lillia Mountain NP-C to restart torsemide 20 mg tablet once today and tomorrow until seen. Aware and agreeable.  Renee Pain, RN

## 2017-09-23 ENCOUNTER — Ambulatory Visit (HOSPITAL_COMMUNITY)
Admission: RE | Admit: 2017-09-23 | Discharge: 2017-09-23 | Disposition: A | Discharge status: Home / Self Care | Payer: Medicare Other | Source: Ambulatory Visit | Attending: Internal Medicine | Admitting: Internal Medicine

## 2017-09-23 ENCOUNTER — Encounter (HOSPITAL_COMMUNITY): Payer: Self-pay

## 2017-09-23 VITALS — BP 132/88 | HR 58 | Wt 239.8 lb

## 2017-09-23 DIAGNOSIS — E1122 Type 2 diabetes mellitus with diabetic chronic kidney disease: Secondary | ICD-10-CM | POA: Diagnosis not present

## 2017-09-23 DIAGNOSIS — I130 Hypertensive heart and chronic kidney disease with heart failure and stage 1 through stage 4 chronic kidney disease, or unspecified chronic kidney disease: Secondary | ICD-10-CM | POA: Diagnosis not present

## 2017-09-23 DIAGNOSIS — E039 Hypothyroidism, unspecified: Secondary | ICD-10-CM | POA: Insufficient documentation

## 2017-09-23 DIAGNOSIS — Z7984 Long term (current) use of oral hypoglycemic drugs: Secondary | ICD-10-CM | POA: Insufficient documentation

## 2017-09-23 DIAGNOSIS — N183 Chronic kidney disease, stage 3 unspecified: Secondary | ICD-10-CM

## 2017-09-23 DIAGNOSIS — I4891 Unspecified atrial fibrillation: Secondary | ICD-10-CM | POA: Diagnosis not present

## 2017-09-23 DIAGNOSIS — Z79899 Other long term (current) drug therapy: Secondary | ICD-10-CM | POA: Insufficient documentation

## 2017-09-23 DIAGNOSIS — Z8249 Family history of ischemic heart disease and other diseases of the circulatory system: Secondary | ICD-10-CM | POA: Insufficient documentation

## 2017-09-23 DIAGNOSIS — Z79890 Hormone replacement therapy (postmenopausal): Secondary | ICD-10-CM | POA: Diagnosis not present

## 2017-09-23 DIAGNOSIS — M109 Gout, unspecified: Secondary | ICD-10-CM | POA: Insufficient documentation

## 2017-09-23 DIAGNOSIS — N179 Acute kidney failure, unspecified: Secondary | ICD-10-CM

## 2017-09-23 DIAGNOSIS — I429 Cardiomyopathy, unspecified: Secondary | ICD-10-CM | POA: Diagnosis not present

## 2017-09-23 DIAGNOSIS — Z7901 Long term (current) use of anticoagulants: Secondary | ICD-10-CM | POA: Insufficient documentation

## 2017-09-23 DIAGNOSIS — Z9884 Bariatric surgery status: Secondary | ICD-10-CM | POA: Diagnosis not present

## 2017-09-23 DIAGNOSIS — G4733 Obstructive sleep apnea (adult) (pediatric): Secondary | ICD-10-CM | POA: Diagnosis not present

## 2017-09-23 DIAGNOSIS — I480 Paroxysmal atrial fibrillation: Secondary | ICD-10-CM

## 2017-09-23 DIAGNOSIS — I5022 Chronic systolic (congestive) heart failure: Secondary | ICD-10-CM | POA: Diagnosis not present

## 2017-09-23 LAB — BASIC METABOLIC PANEL
Anion gap: 10 (ref 5–15)
BUN: 28 mg/dL — ABNORMAL HIGH (ref 6–20)
CO2: 21 mmol/L — AB (ref 22–32)
CREATININE: 2.2 mg/dL — AB (ref 0.44–1.00)
Calcium: 9.4 mg/dL (ref 8.9–10.3)
Chloride: 108 mmol/L (ref 98–111)
GFR calc Af Amer: 28 mL/min — ABNORMAL LOW (ref >60)
GFR calc non Af Amer: 24 mL/min — ABNORMAL LOW (ref >60)
GLUCOSE: 87 mg/dL (ref 70–99)
Potassium: 4.3 mmol/L (ref 3.5–5.1)
Sodium: 139 mmol/L (ref 135–145)

## 2017-09-23 LAB — BRAIN NATRIURETIC PEPTIDE: B Natriuretic Peptide: 319.6 pg/mL — ABNORMAL HIGH (ref 0.0–100.0)

## 2017-09-23 MED ORDER — TORSEMIDE 20 MG PO TABS: 20.0000 mg | ORAL_TABLET | Freq: Every day | ORAL | 11 refills | Status: DC | PRN

## 2017-09-23 NOTE — Patient Instructions (Signed)
Routine lab work today. Will notify you of abnormal results, otherwise no news is good news!  STOP Torsemide.  STOP Entresto.  STOP Spironolactone.  Follow up with pharmacy in 4 weeks.  _______________________________________________________________________ Erin Jarvis Code: 1400  Follow up with Dr. Aundra Dubin in 2-3 months with echocardiogram.  ________________________________________________________________________ Erin Jarvis Code:   Take all medication as prescribed the day of your appointment. Bring all medications with you to your appointment.  Do the following things EVERYDAY: 1) Weigh yourself in the morning before breakfast. Write it down and keep it in a log. 2) Take your medicines as prescribed 3) Eat low salt foods-Limit salt (sodium) to 2000 mg per day.  4) Stay as active as you can everyday 5) Limit all fluids for the day to less than 2 liters

## 2017-09-24 DIAGNOSIS — N179 Acute kidney failure, unspecified: Secondary | ICD-10-CM | POA: Diagnosis not present

## 2017-09-24 DIAGNOSIS — O903 Peripartum cardiomyopathy: Secondary | ICD-10-CM | POA: Diagnosis not present

## 2017-09-24 DIAGNOSIS — E1129 Type 2 diabetes mellitus with other diabetic kidney complication: Secondary | ICD-10-CM | POA: Diagnosis not present

## 2017-09-24 DIAGNOSIS — N189 Chronic kidney disease, unspecified: Secondary | ICD-10-CM | POA: Diagnosis not present

## 2017-09-24 DIAGNOSIS — I129 Hypertensive chronic kidney disease with stage 1 through stage 4 chronic kidney disease, or unspecified chronic kidney disease: Secondary | ICD-10-CM | POA: Diagnosis not present

## 2017-09-24 DIAGNOSIS — I4891 Unspecified atrial fibrillation: Secondary | ICD-10-CM | POA: Diagnosis not present

## 2017-09-28 DIAGNOSIS — Z9884 Bariatric surgery status: Secondary | ICD-10-CM | POA: Diagnosis not present

## 2017-09-29 ENCOUNTER — Other Ambulatory Visit (HOSPITAL_COMMUNITY): Payer: Self-pay | Admitting: Internal Medicine

## 2017-09-29 ENCOUNTER — Ambulatory Visit (INDEPENDENT_AMBULATORY_CARE_PROVIDER_SITE_OTHER): Payer: Medicare Other

## 2017-09-29 DIAGNOSIS — I5022 Chronic systolic (congestive) heart failure: Secondary | ICD-10-CM

## 2017-09-29 DIAGNOSIS — Z95810 Presence of automatic (implantable) cardiac defibrillator: Secondary | ICD-10-CM

## 2017-09-29 NOTE — Progress Notes (Signed)
EPIC Encounter for ICM Monitoring  Patient Name: Erin Jarvis is a 52 y.o. female Date: 09/29/2017 Primary Care Physican: [name], MD Primary Cardiologist:Hochrein/McLean Electrophysiologist: Allred Nephrologist: Dr Hollie Salk at [facility] Weight: 241lbs      Heart Failure questions reviewed, pt asymptomatic.  Torsemide now PRN per 6/26 by HF clinic due to she appeared stable to dry on OV exam which does not correlate with Optivol trending significantly below baseline.   On 6/21 OV with nephrologist, she received 500 cc IV fluid for suspected dehydration and also stopped entresto and Spiro for elevated cr/bun (2.64/51).  Thoracic impedance abnormal suggesting fluid accumulation starting 09/09/2017.  Discussed having Optivol calibrated since patient has been losing weight but she may be close to replacement of battery in the next few months.  A new battery will calculate a new baseline for patient.   Prescribed dosage: Torsemide 20 mg Take 1 tablet (20 mg total) by mouth daily as needed (For swelling, weight gain).  Labs: 09/23/2017 Creatinine 2.20, BUN 28, Potassium 4.3, Sodium 139, EGFR 24-28 09/18/2017 Creatinine 2.64, BUN 51, Potassium 3.8, Sodium 141, EGFR 20-23 09/01/2017 Creatinine 3.43, BUN 65, Potassium 3.5, Sodium 142, EGFR 14-17 07/22/2017 Creatinine 2.08, BUN 40, Potassium 3.3, Sodium 138, EGFR 26-30 07/10/2017 Creatinine 1.50, BUN 27, Potassium 4.2, Sodium 140, EGFR 39-45 (Care Everywhere) 07/07/2017 Creatinine 2.15, BUN 33, Potassium 3.9, Sodium 138, EGFR 25-29 06/11/2017 Creatinine 2.06, BUN 53, Potassium 4.3, Sodium 135, EGFR 27-31 03/27/2017 Creatinine 1.62, BUN 45, Potassium 4.0, Sodium 137, EGFR 35-41 02/26/2017 Creatinine 1.67, BUN 39, Potassium 4.5, Sodium 138, EGFR 34-39 02/18/2017 Creatinine 2.37, BUN 71, Potassium 4.3, Sodium 135, EGFR 22-26 12/26/2016 Creatinine 1.72, BUN 45, Potassium 3.3, Sodium 137, EGFR 33-38 12/05/2016 Creatinine 2.20, BUN  62, Potassium 4.0, Sodium 136, EGFR 24-28 10/23/2016 Creatinine 1.66, BUN 46, Potassium 3.6, Sodium 137, EGFR 34-40  Recommendations:  Advised patient to continue with plan of taking Torsemide as needed for symptoms.  If any recommendations given will call her back.  Advised to monitor closely for fluid symptoms and call if needed.  Follow-up plan: ICM clinic phone appointment on 11/05/2017.   Copy of ICM check sent to Dr. Rayann Heman and Dr. Aundra Dubin and Lillia Mountain, NP at [place] [facility].   3 month ICM trend: 09/29/2017    1 Year ICM trend:       [name], RN 09/29/2017 10:21 AM

## 2017-09-29 NOTE — Progress Notes (Signed)
I think she needs RHC given rise in creatinine but also Optivol suggesting worsening volume status.  Not sure if Optivol is accurate.  Would suggest RHC for definitive assessment of volume.

## 2017-09-30 ENCOUNTER — Other Ambulatory Visit (HOSPITAL_COMMUNITY): Payer: Self-pay

## 2017-09-30 ENCOUNTER — Telehealth (HOSPITAL_COMMUNITY): Payer: Self-pay

## 2017-09-30 DIAGNOSIS — I5022 Chronic systolic (congestive) heart failure: Secondary | ICD-10-CM

## 2017-09-30 NOTE — Progress Notes (Signed)
Please see phone note  

## 2017-09-30 NOTE — Telephone Encounter (Signed)
Set up cath and called pt no answer Left message to call back    Author: Larey Dresser, MD Service: Heart Failure Author Type: Physician  Filed: 09/29/2017 6:16 PM Encounter Date: 09/29/2017 Status: Signed  Editor: Larey Dresser, MD (Physician)       Show:Clear all [x] Manual[] Template[] Copied  Added by: [x] McLean, Dalton S, MD   [] Hover for details   I think she needs RHC given rise in creatinine but also Optivol suggesting worsening volume status.  Not sure if Optivol is accurate.  Would suggest RHC for definitive assessment of volume.

## 2017-10-12 ENCOUNTER — Other Ambulatory Visit (HOSPITAL_COMMUNITY): Payer: Self-pay | Admitting: Internal Medicine

## 2017-10-14 ENCOUNTER — Ambulatory Visit (HOSPITAL_COMMUNITY)
Admission: RE | Admit: 2017-10-14 | Discharge: 2017-10-14 | Disposition: A | Discharge status: Home / Self Care | Payer: Medicare Other | Source: Ambulatory Visit | Attending: Cardiology | Admitting: Cardiology

## 2017-10-14 VITALS — BP 128/72 | HR 56 | Wt 236.0 lb

## 2017-10-14 DIAGNOSIS — G4733 Obstructive sleep apnea (adult) (pediatric): Secondary | ICD-10-CM | POA: Diagnosis not present

## 2017-10-14 DIAGNOSIS — I428 Other cardiomyopathies: Secondary | ICD-10-CM | POA: Insufficient documentation

## 2017-10-14 DIAGNOSIS — Z95810 Presence of automatic (implantable) cardiac defibrillator: Secondary | ICD-10-CM | POA: Insufficient documentation

## 2017-10-14 DIAGNOSIS — I4891 Unspecified atrial fibrillation: Secondary | ICD-10-CM | POA: Diagnosis not present

## 2017-10-14 DIAGNOSIS — N183 Chronic kidney disease, stage 3 (moderate): Secondary | ICD-10-CM | POA: Insufficient documentation

## 2017-10-14 DIAGNOSIS — I4892 Unspecified atrial flutter: Secondary | ICD-10-CM | POA: Insufficient documentation

## 2017-10-14 DIAGNOSIS — I130 Hypertensive heart and chronic kidney disease with heart failure and stage 1 through stage 4 chronic kidney disease, or unspecified chronic kidney disease: Secondary | ICD-10-CM | POA: Diagnosis not present

## 2017-10-14 DIAGNOSIS — Z79899 Other long term (current) drug therapy: Secondary | ICD-10-CM | POA: Diagnosis not present

## 2017-10-14 DIAGNOSIS — Z9884 Bariatric surgery status: Secondary | ICD-10-CM | POA: Diagnosis not present

## 2017-10-14 DIAGNOSIS — I472 Ventricular tachycardia: Secondary | ICD-10-CM | POA: Diagnosis not present

## 2017-10-14 DIAGNOSIS — Z7901 Long term (current) use of anticoagulants: Secondary | ICD-10-CM | POA: Diagnosis not present

## 2017-10-14 DIAGNOSIS — I5022 Chronic systolic (congestive) heart failure: Secondary | ICD-10-CM | POA: Insufficient documentation

## 2017-10-14 LAB — BASIC METABOLIC PANEL
Anion gap: 8 (ref 5–15)
BUN: 18 mg/dL (ref 6–20)
CALCIUM: 9.6 mg/dL (ref 8.9–10.3)
CO2: 25 mmol/L (ref 22–32)
CREATININE: 1.66 mg/dL — AB (ref 0.44–1.00)
Chloride: 109 mmol/L (ref 98–111)
GFR calc non Af Amer: 34 mL/min — ABNORMAL LOW (ref >60)
GFR, EST AFRICAN AMERICAN: 39 mL/min — AB (ref >60)
GLUCOSE: 104 mg/dL — AB (ref 70–99)
Potassium: 4 mmol/L (ref 3.5–5.1)
Sodium: 142 mmol/L (ref 135–145)

## 2017-10-14 MED ORDER — SACUBITRIL-VALSARTAN 24-26 MG PO TABS: 1.0000 | ORAL_TABLET | Freq: Two times a day (BID) | ORAL | 6 refills | Status: DC

## 2017-10-14 NOTE — Progress Notes (Signed)
PCP: Dr. Sherrie Sport HF Cardiology: Dr. Aundra Dubin   HPI:  Erin Jarvis is a 52 y.o. 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 At one point EF improved to 45% from 20%. She also has a history VT with Medtronic ICD placed in [place]. In 12/17, she was admitted to [facility] with new onset atrial fibrillation/RVR and volume overload. Required IV diuresis and cardizem drip.  She remained in atrial fibrillation.  Warfarin was started that admission.   On 03/26/16, she saw Dr Percival Spanish and she was set up TEE/DCCV. Creatinine at that time was 2.03. She had had a functional decline and dyspnea at rest. Weight at home had been trending up from 290 to 309 pounds. SBP in 90s at Dr. Rosezella [place] office.   On 04/08/16, she presented for scheduled TEE/DC-CV, however thrombus was noted in LA appendage so DC-CV was not pursued. TEE also showed severe LV dysfunction. Dyspneic at rest. SBP soft.  She was admitted and ultimately required initiation of milrinone gtt + diuresis with IV Lasix and metolazone.  She lost > 20 lbs.  We were able to titrate her off milrinone and she was sent home on torsemide.  Rate was difficult to control, Toprol XL was titrated up to 75 mg bid.   At appointment in 2/18, she was noted to be back in NSR.  She remains in NSR today.   She had gastric bypass in 3/19 at [facility].   She saw Dr. Rayann Heman for evaluation for possible afib ablation.  He recommended initial weight loss + amiodarone, then try to stop amiodarone after she has lost a fair amount of weight.   She recently returned as an add on to HF clinic for volume overload per Optivol. She had an appointment recently with nephrology with elevated creatinine to 2.6, so her torsemide, entresto, and spiro were held and she was given 500 cc bolus. She had been off these meds since 6/21. Her optivol reading indicated volume overload, so restarted her torsemide 20 mg prn bloating, or LE edema.  She continued to hold Entresto and spironolactone.    Today she presents to initial pharmacy medication titration clinic with no knew complaints.  Her weight is down another 3lbs, she has take Torsemide twice for bloating / abdominal distention.  She feels good today no bloating or LE edema.   Her ReDS reading is 25%. She follows low fat / low salt diet.  She was swimming for exercise 3-4 days a week but when renal function was up she felt extremely fatigued and she stopped. - Plan to restart soon.    .  Shortness of breath/dyspnea on exertion? no  . Orthopnea/PND? no . Edema? no . Lightheadedness/dizziness? No - occasionally when sit to stand . Daily weights at home? yes . Blood pressure/heart rate monitoring at home? yes . Following low-sodium/fluid-restricted diet? yes  HF Medications: Toprol 100mg  BID Torsemide 20mg  prn bloating/ edema  Has the patient been experiencing any side effects to the medications prescribed?  no  Does the patient have any problems obtaining medications due to transportation or finances?   no  Understanding of regimen: good Understanding of indications: good Potential of compliance: good Patient understands to avoid NSAIDs. Patient understands to avoid decongestants.    Pertinent Lab Values: . Serum creatinine 2.2 > 1.66 (improved), CO2 25, Potassium 4 stable, Sodium 142,    Vital Signs: . Weight: 236lb (dry weight: ) . Blood pressure: 128/72 . Heart rate: 56 . O2  98% . ReDS 25%  Assessment: 1. Chronicsystolic CHF (EF 62%) improved, due to NICM NYHA class IIsymptoms. -volume status stable - no edema, no bloating, can not tell volume status from weight because s/p bariatric surgery and losing weight, ReDS reading 25% - BP stable 128/72 HR 56 discussed with Dr. Aundra Dubin restart 24-26mg  entresto recheck labs 1 week -Cr improved 2.2> 1.6, K stable  - continue current HF medications Toprol 100mg  BID Torsemide 20mg  prn bloating/ edema - Basic  disease state pathophysiology, medication indication, mechanism and side effects reviewed at length with patient and he verbalized understanding  2. Atrial flutter/fibrillation: Noted in 12/17.  TEE in 1/18 showed LA appendage thrombus. She is now on Eliquis. Suspect atrial fibrillation with RVR drove her last CHF decompensation.  She remains in NSR today on amiodarone.  - Continue amiodarone 200 mg daily, hopefully will hold her in NSR.  TSH elevated 06/2017 -> message sent to PCP for further management. AST mildly elevated 52, ALT normal 06/2017 - Continue Toprol XL100 bid.  - Continue Eliquis. Denies bleeding.  - She saw Dr. Rayann Heman to discuss atrial fibrillation ablation.    3. CKD: Stage III.  She has an atrophic left kidney. Baseline creatinine ~1.6-2.0 - Creatinine up to 2.6 at Nephrologist appointment. Received 500 cc bolus and spiro, entresto, and torsemide were held > now improved 1.6 continue to hold spironolactone but restart Entresto 24-26mg  BID and prn torsemide - BMET today. .  4. H/o VT: Has ICD. No VT/VF on interrogation 5. OSA: Encouraged to use CPAP nightly. No change.  6. HTN - Stable 122/70 but gets SBP 100s on home readings      Plan: 1) Medication changes: Based on clinical presentation, vital signs and recent labs -volume status stable - BP stable 128/72 HR 56 discussed with Dr. Aundra Dubin restart 24-26mg  entresto recheck labs 1 week -Cr improved 2.2> 1.6, K stable  - continue current HF medications Toprol 100mg  BID Torsemide 20mg  prn bloating/ edema  2) Labs: BMET next visit 7/29 3) Follow-up: 11/27/17 with Dr. Algie Coffer.D. CPP, BCPS Clinical Pharmacist [phone] 10/14/2017 10:28 PM

## 2017-10-14 NOTE — Patient Instructions (Addendum)
No Spironolactone or Torsemide  Restart Entresto 24-26mg  1 tablet twice daily  Follow up labs 7/29 Monday at 9:30  Follow Up with Dr Aundra Dubin and ECHO 11/27/17 at 10am

## 2017-10-26 ENCOUNTER — Encounter (HOSPITAL_COMMUNITY): Admission: RE | Payer: Self-pay | Source: Ambulatory Visit

## 2017-10-26 ENCOUNTER — Other Ambulatory Visit (HOSPITAL_COMMUNITY): Payer: Medicare Other

## 2017-10-26 ENCOUNTER — Ambulatory Visit (HOSPITAL_COMMUNITY): Admission: RE | Admit: 2017-10-26 | Payer: Medicare Other | Source: Ambulatory Visit | Admitting: Cardiology

## 2017-10-26 SURGERY — RIGHT HEART CATH
Anesthesia: LOCAL

## 2017-11-05 ENCOUNTER — Ambulatory Visit (INDEPENDENT_AMBULATORY_CARE_PROVIDER_SITE_OTHER): Payer: Medicare Other | Admitting: *Deleted

## 2017-11-05 ENCOUNTER — Ambulatory Visit (INDEPENDENT_AMBULATORY_CARE_PROVIDER_SITE_OTHER): Payer: Medicare Other

## 2017-11-05 ENCOUNTER — Telehealth: Payer: Self-pay

## 2017-11-05 DIAGNOSIS — I428 Other cardiomyopathies: Secondary | ICD-10-CM

## 2017-11-05 DIAGNOSIS — I5022 Chronic systolic (congestive) heart failure: Secondary | ICD-10-CM

## 2017-11-05 DIAGNOSIS — Z95810 Presence of automatic (implantable) cardiac defibrillator: Secondary | ICD-10-CM | POA: Diagnosis not present

## 2017-11-05 NOTE — Progress Notes (Signed)
EPIC Encounter for ICM Monitoring  Patient Name: Erin Jarvis is a 52 y.o. female Date: 11/05/2017 Primary Care Physican: [name], MD Primary Cardiologist:Hochrein/McLean Electrophysiologist: Allred Nephrologist: Dr Hollie Salk at [facility] Weight: 239lbs       Heart Failure questions reviewd, pt asymptomatic.  She stated she is feeling fine.  Patient report right heart cath canceled due to ReDs Vest showed 25 which is normal range and does not show any fluid.    Thoracic impedance abnormal suggesting fluid accumulation starting 08/12/2017 with a couple of days close to baseline.  Fluid Index > threshold starting 08/24/2017.  Last ICM note Dr Aundra Dubin is not sure if Optivol is accurate  Prescribed dosage: Torsemide 20 mg Take 1 tablet (20 mg total) by mouth daily as needed (For swelling, weight gain).  Labs: 10/14/2017 Creatinine 1.66, BUM 18, Potassium 4.0, Sodium 142, EGFR 34-39 09/23/2017 Creatinine 2.20, BUN 28, Potassium 4.3, Sodium 139, EGFR 24-28 09/18/2017 Creatinine 2.64, BUN 51, Potassium 3.8, Sodium 141, EGFR 20-23 09/01/2017 Creatinine 3.43, BUN 65, Potassium 3.5, Sodium 142, EGFR 14-17 07/22/2017 Creatinine 2.08, BUN 40, Potassium 3.3, Sodium 138, EGFR 26-30 07/10/2017 Creatinine 1.50, BUN 27, Potassium 4.2, Sodium 140, EGFR 39-45 (Care Everywhere) 07/07/2017 Creatinine 2.15, BUN 33, Potassium 3.9, Sodium 138, EGFR 25-29 06/11/2017 Creatinine 2.06, BUN 53, Potassium 4.3, Sodium 135, EGFR 27-31 03/27/2017 Creatinine 1.62, BUN 45, Potassium 4.0, Sodium 137, EGFR 35-41 02/26/2017 Creatinine 1.67, BUN 39, Potassium 4.5, Sodium 138, EGFR 34-39 02/18/2017 Creatinine 2.37, BUN 71, Potassium 4.3, Sodium 135, EGFR 22-26 12/26/2016 Creatinine 1.72, BUN 45, Potassium 3.3, Sodium 137, EGFR 33-38 12/05/2016 Creatinine 2.20, BUN 62, Potassium 4.0, Sodium 136, EGFR 24-28 10/23/2016 Creatinine 1.66, BUN 46, Potassium 3.6, Sodium 137, EGFR 34-40  Recommendations: No  changes.   Encouraged to call for fluid symptoms.  Follow-up plan: ICM clinic phone appointment on 12/07/2017.   Office appointment scheduled 11/27/2017 with Dr. Aundra Dubin.    Copy of ICM check sent to Dr. Rayann Heman and Dr Aundra Dubin for review.   3 month ICM trend: 11/05/2017    1 Year ICM trend:       [name], RN 11/05/2017 3:07 PM

## 2017-11-05 NOTE — Telephone Encounter (Signed)
Spoke with patient for ICM follow up.  She reported she needs Entresto samples.  Advised I would forward to refill department at HF clinic.

## 2017-11-06 NOTE — Progress Notes (Signed)
Remote ICD transmission.   

## 2017-11-06 NOTE — Progress Notes (Signed)
Have her weight herself, would not increase torsemide unless weight is trending up.

## 2017-11-06 NOTE — Progress Notes (Signed)
Attempted call to patient and left message to return call.  °

## 2017-11-25 LAB — CUP PACEART REMOTE DEVICE CHECK
Battery Voltage: 2.62 V
Brady Statistic RV Percent Paced: 0.04 %
Date Time Interrogation Session: 20190808083627
HighPow Impedance: 54 Ohm
HighPow Impedance: 66 Ohm
Implantable Lead Implant Date: 20100824
Implantable Lead Location: 753860
Lead Channel Pacing Threshold Amplitude: 2.5 V
Lead Channel Pacing Threshold Pulse Width: 0.4 ms
Lead Channel Sensing Intrinsic Amplitude: 3.25 mV
Lead Channel Setting Pacing Amplitude: 5 V
MDC IDC MSMT LEADCHNL RV IMPEDANCE VALUE: 494 Ohm
MDC IDC MSMT LEADCHNL RV SENSING INTR AMPL: 3.25 mV
MDC IDC PG IMPLANT DT: 20100824
MDC IDC SET LEADCHNL RV PACING PULSEWIDTH: 0.4 ms
MDC IDC SET LEADCHNL RV SENSING SENSITIVITY: 0.3 mV

## 2017-11-27 ENCOUNTER — Ambulatory Visit (HOSPITAL_COMMUNITY)
Admission: RE | Admit: 2017-11-27 | Discharge: 2017-11-27 | Disposition: A | Discharge status: Home / Self Care | Payer: Medicare Other | Source: Ambulatory Visit | Attending: Cardiology | Admitting: Cardiology

## 2017-11-27 ENCOUNTER — Ambulatory Visit (HOSPITAL_BASED_OUTPATIENT_CLINIC_OR_DEPARTMENT_OTHER)
Admission: RE | Admit: 2017-11-27 | Discharge: 2017-11-27 | Disposition: A | Discharge status: Home / Self Care | Payer: Medicare Other | Source: Ambulatory Visit | Attending: Cardiology | Admitting: Cardiology

## 2017-11-27 ENCOUNTER — Encounter (HOSPITAL_COMMUNITY): Payer: Self-pay | Admitting: Cardiology

## 2017-11-27 ENCOUNTER — Other Ambulatory Visit: Payer: Self-pay

## 2017-11-27 VITALS — BP 140/74 | HR 79 | Wt 232.0 lb

## 2017-11-27 DIAGNOSIS — G4733 Obstructive sleep apnea (adult) (pediatric): Secondary | ICD-10-CM | POA: Diagnosis not present

## 2017-11-27 DIAGNOSIS — N183 Chronic kidney disease, stage 3 unspecified: Secondary | ICD-10-CM

## 2017-11-27 DIAGNOSIS — Z7901 Long term (current) use of anticoagulants: Secondary | ICD-10-CM | POA: Diagnosis not present

## 2017-11-27 DIAGNOSIS — E02 Subclinical iodine-deficiency hypothyroidism: Secondary | ICD-10-CM | POA: Diagnosis not present

## 2017-11-27 DIAGNOSIS — E1122 Type 2 diabetes mellitus with diabetic chronic kidney disease: Secondary | ICD-10-CM | POA: Diagnosis not present

## 2017-11-27 DIAGNOSIS — I472 Ventricular tachycardia, unspecified: Secondary | ICD-10-CM

## 2017-11-27 DIAGNOSIS — I5022 Chronic systolic (congestive) heart failure: Secondary | ICD-10-CM

## 2017-11-27 DIAGNOSIS — Z79890 Hormone replacement therapy (postmenopausal): Secondary | ICD-10-CM | POA: Diagnosis not present

## 2017-11-27 DIAGNOSIS — I481 Persistent atrial fibrillation: Secondary | ICD-10-CM | POA: Insufficient documentation

## 2017-11-27 DIAGNOSIS — M109 Gout, unspecified: Secondary | ICD-10-CM | POA: Insufficient documentation

## 2017-11-27 DIAGNOSIS — I130 Hypertensive heart and chronic kidney disease with heart failure and stage 1 through stage 4 chronic kidney disease, or unspecified chronic kidney disease: Secondary | ICD-10-CM | POA: Insufficient documentation

## 2017-11-27 DIAGNOSIS — Z9884 Bariatric surgery status: Secondary | ICD-10-CM | POA: Insufficient documentation

## 2017-11-27 DIAGNOSIS — I480 Paroxysmal atrial fibrillation: Secondary | ICD-10-CM | POA: Diagnosis not present

## 2017-11-27 DIAGNOSIS — Z79899 Other long term (current) drug therapy: Secondary | ICD-10-CM | POA: Insufficient documentation

## 2017-11-27 LAB — COMPREHENSIVE METABOLIC PANEL
ALBUMIN: 3.8 g/dL (ref 3.5–5.0)
ALK PHOS: 113 U/L (ref 38–126)
ALT: 21 U/L (ref 0–44)
AST: 36 U/L (ref 15–41)
Anion gap: 10 (ref 5–15)
BILIRUBIN TOTAL: 1.2 mg/dL (ref 0.3–1.2)
BUN: 16 mg/dL (ref 6–20)
CALCIUM: 9.2 mg/dL (ref 8.9–10.3)
CO2: 18 mmol/L — ABNORMAL LOW (ref 22–32)
CREATININE: 1.48 mg/dL — AB (ref 0.44–1.00)
Chloride: 114 mmol/L — ABNORMAL HIGH (ref 98–111)
GFR calc Af Amer: 45 mL/min — ABNORMAL LOW (ref >60)
GFR calc non Af Amer: 39 mL/min — ABNORMAL LOW (ref >60)
GLUCOSE: 97 mg/dL (ref 70–99)
Potassium: 3.5 mmol/L (ref 3.5–5.1)
Sodium: 142 mmol/L (ref 135–145)
TOTAL PROTEIN: 6.6 g/dL (ref 6.5–8.1)

## 2017-11-27 LAB — CBC
HCT: 38.5 % (ref 36.0–46.0)
Hemoglobin: 12.7 g/dL (ref 12.0–15.0)
MCH: 27.1 pg (ref 26.0–34.0)
MCHC: 33 g/dL (ref 30.0–36.0)
MCV: 82.1 fL (ref 78.0–100.0)
PLATELETS: 157 10*3/uL (ref 150–400)
RBC: 4.69 MIL/uL (ref 3.87–5.11)
RDW: 13.9 % (ref 11.5–15.5)
WBC: 3 10*3/uL — ABNORMAL LOW (ref 4.0–10.5)

## 2017-11-27 LAB — TSH: TSH: <0.01 u[IU]/mL — ABNORMAL LOW (ref 0.350–4.500)

## 2017-11-27 MED ORDER — SPIRONOLACTONE 25 MG PO TABS: 12.5000 mg | ORAL_TABLET | Freq: Every day | ORAL | 3 refills | Status: DC

## 2017-11-27 NOTE — Patient Instructions (Addendum)
EKG today.  START Spironolactone 12.5 mg (1/2 tablet) once daily.  STOP Amiodarone.  Routine lab work today. Will notify you of abnormal results, otherwise no news is good news!  Return in 2 weeks for repeat labs.  ______________________________________________________________ Erin Jarvis Code: 1600  Follow up 3 months with Dr. Aundra Dubin.  ______________________________________________________________ Erin Jarvis Code: 1800  Take all medication as prescribed the day of your appointment. Bring all medications with you to your appointment.  Do the following things EVERYDAY: 1) Weigh yourself in the morning before breakfast. Write it down and keep it in a log. 2) Take your medicines as prescribed 3) Eat low salt foods-Limit salt (sodium) to 2000 mg per day.  4) Stay as active as you can everyday 5) Limit all fluids for the day to less than 2 liters

## 2017-11-27 NOTE — Progress Notes (Signed)
  Echocardiogram 2D Echocardiogram has been performed.  Roslyn Else G Maxmilian Trostel 11/27/2017, 10:55 AM

## 2017-11-30 NOTE — Progress Notes (Signed)
PCP: Dr. Sherrie Sport HF Cardiology: Dr. Aundra Dubin  Erin Jarvis is a 52 y.o. 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 At one point EF improved to 45% from 20%.  She also has a history VT with Medtronic ICD placed in [place]. In 12/17, she was  admitted to [facility] with new onset atrial fibrillation/RVR and volume overload. Required IV diuresis and cardizem drip.  She remained in atrial fibrillation.  Warfarin was started that admission.   On 03/26/16, she saw Dr Percival Spanish and she was set up TEE/DCCV. Creatinine at that time was 2.03.  She had had a functional decline and dyspnea at rest. Weight at home had been trending up from 290 to 309 pounds.  SBP in 90s at Dr. Rosezella [place] office.   On 04/08/16, she presented for scheduled TEE/DC-CV, however thrombus was noted in LA appendage so DC-CV was not pursued. TEE also showed severe LV dysfunction. Dyspneic at rest. SBP soft.  She was admitted and ultimately required initiation of milrinone gtt + diuresis with IV Lasix and metolazone.  She lost > 20 lbs.  We were able to titrate her off milrinone and she was sent home on torsemide.  Rate was difficult to control, Toprol XL was titrated up to 75 mg bid.   At appointment in 2/18, she was noted to be back in NSR.  She remains in NSR today.   She had gastric bypass in 3/19 at [facility].   She saw Dr. Rayann Heman for evaluation for possible afib ablation.  He recommended initial weight loss + amiodarone, then try to stop amiodarone after she has lost a fair amount of weight. She has now lost around 70 lbs.    She returns for followup of CHF.  Weight is down 7 lbs since last appointment.  She is using CPAP at night.  She has not been taking torsemide.  No significant exertional dyspnea with her normal activities.  No chest pain.  No orthopnea/PND.   Echo was done today, showing EF 40-45% with LV dilation.   Labs (1/18): digoxin 0.5, K 4.5, creatinine 2.13, digoxin 1.7,  AST 53, ALT 34, TSH elevated Labs (2/18): K 3.9, creatinine 1.98 => 1.6, TSH 14, normal free T3 and free T4, LFTs normal, digoxin 1.7 Labs (3/18): K 3.8, creatinine 1.8, digoxin 1.0 Labs (12/18): K 4, creatinine 1.62 Labs (3/19): creatinine 2.06 Labs (7/19): K 4, creatinine 1.66  ECG (personally reviewed): NSR, poor RWP  Medtronic device interrogation: Thoracic impedance > threshold  REDS clip reading: 31%    PMH: 1. Chronic systolic CHF: Nonischemic cardiomyopathy.  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  EF initially 20%, rose to 45% at one point.   - h/o VT => Medtronic ICD.  - Echo (1/18): EF 25-30%, mildly dilated RV with normal systolic function, moderate TR, PASP 31 mmHg.  - LHC/RHC (1/18): No CAD; mean RA 19, PA 46/20 mean 35, mean PCWP 24, CI 2.37 (Thermo), CI 3.16 (Fick), PVR 1.9 WU.  - Echo (8/19): EF 40-45%, severe LV dilation, no significant MR 2. Atrial fibrillation: Persistent.  Diagnosed 12/17 when she presented to [facility] with afib/RVR.  TEE/DCCV set up in 1/18 but no DCCV due to LA thrombus on TEE.   3. Gout 4. Type II diabetes 5. HTN 6. OSA: Uses CPAP 7. CKD stage III: Has history of atrophic left kidney  8. Subclinical hypothyroidism 9. VT: 1/18, had ICD discharge.  10. Gastric bypass 3/19.   SH: Divorced,  on disability, lives with parents in [place], nonsmoker, no ETOH, no drugs.   Family History  Problem Relation Age of Onset  . Other Mother        has chronic pain syndrome from DDD with spinal cord stimulator  . Hypertension Mother   . COPD Father   . Arthritis/Rheumatoid Father    ROS: All systems reviewed and negative except as per HPI.   Current Outpatient Medications  Medication Sig Dispense Refill  . apixaban (ELIQUIS) 5 MG TABS tablet Take 1 tablet (5 mg total) by mouth 2 (two) times daily. 180 tablet 3  . Calcium Citrate-Vitamin D (CALCIUM CITRATE + D) 250-200 MG-UNIT TABS Take 1 tablet by mouth daily.    .  citalopram (CELEXA) 10 MG tablet Take 10 mg by mouth daily.     . ferrous sulfate 325 (65 FE) MG tablet Take 325 mg by mouth daily with breakfast.    . levothyroxine (SYNTHROID, LEVOTHROID) 150 MCG tablet Take 150 mcg by mouth daily before breakfast.     . metoprolol succinate (TOPROL-XL) 100 MG 24 hr tablet TAKE ONE TABLET (100 MG) TWICE DAILY 60 tablet 6  . Multiple Vitamin (MULTIVITAMIN) tablet Take 1 tablet by mouth daily.    . pantoprazole (PROTONIX) 40 MG tablet Take 40 mg by mouth daily.    . promethazine (PHENERGAN) 25 MG tablet Take 25 mg by mouth every 8 (eight) hours as needed for nausea or vomiting.    . sacubitril-valsartan (ENTRESTO) 24-26 MG Take 1 tablet by mouth 2 (two) times daily. 60 tablet 6  . torsemide (DEMADEX) 20 MG tablet Take 1 tablet (20 mg total) by mouth daily as needed (For swelling, weight gain). 30 tablet 11  . vitamin B-12 (CYANOCOBALAMIN) 1000 MCG tablet Take 1,000 mcg by mouth daily.    Marland Kitchen spironolactone (ALDACTONE) 25 MG tablet Take 0.5 tablets (12.5 mg total) by mouth daily. 45 tablet 3   No current facility-administered medications for this encounter.    BP 140/74   Pulse 79   Wt 105.2 kg (232 lb)   LMP 03/14/2014 Comment: neg preg test  SpO2 100%   BMI 36.34 kg/m  General: NAD Neck: No JVD, no thyromegaly or thyroid nodule.  Lungs: Clear to auscultation bilaterally with normal respiratory effort. CV: Nondisplaced PMI.  Heart regular S1/S2, no S3/S4, no murmur.  No peripheral edema.  No carotid bruit.  Normal pedal pulses.  Abdomen: Soft, nontender, no hepatosplenomegaly, no distention.  Skin: Intact without lesions or rashes.  Neurologic: Alert and oriented x 3.  Psych: Normal affect. Extremities: No clubbing or cyanosis.  HEENT: Normal.   Assessment/Plan: 1. Chronic systolic CHF: Echo 2/35 with EF 25-30%, nonischemic cardiomyopathy. Cardiomyopathy known since 17. Possibly peri-partum. SPEP negative.  Has Medtronic ICD. LHC (1/18) with no  coronary disease.  She was admitted in 1/18 with extensive diuresis, required milrinone due to low output but was able to titrate off.  When RHC was done, cardiac output was ok off milrinone. Most recent echo 8/19 with EF up to 40-45%.  NYHA class II symptoms now, she is not volume overloaded on exam. Optivol has suggested volume overload in recent weeks but REDS clip measurement of 31% and physical exam as well as weight loss suggest euvolemia.  - I think Optivol is inaccurate, possibly due to significant weight loss.  Needs to be reset.   - She can continue to use torsemide prn.   - Continue Toprol XL 100 mg bid.    -  Restart spironolactone 12.5 mg daily.  BMET today and again in 10 days.   - Continue Entresto 24/26 bid.  2. Atrial flutter/fibrillation:  Noted in 12/17.  TEE in 1/18 showed LA appendage thrombus. She is now on Eliquis. Suspect atrial fibrillation with RVR drove her last CHF decompensation.  She remains in NSR today on amiodarone.  - I am going to have her stop amiodarone.  She has lost around 70 lbs, hopefully this will help keep her in NSR.  If atrial fibrillation recurs off amiodarone, would consider atrial fibrillation ablation.  - Continue Toprol XL 100 bid.   - Continue Eliquis  3. CKD: Stage III.  She has an atrophic left kidney.  BMET today.  4. H/o VT: Has ICD. 5. OSA: Encouraged to use CPAP.   6. Iatrogenic hyperthyroidism: Low TSH recently, sent to PCP but she says that her Levoxyl was not changed.  I will repeat TSH today, if TSH remains low I will decrease her Levoxyl.   Followup in 3 months.   Erin Jarvis 11/30/2017

## 2017-12-02 ENCOUNTER — Other Ambulatory Visit (HOSPITAL_COMMUNITY): Payer: Self-pay | Admitting: *Deleted

## 2017-12-02 MED ORDER — SACUBITRIL-VALSARTAN 24-26 MG PO TABS: 1.0000 | ORAL_TABLET | Freq: Two times a day (BID) | ORAL | 11 refills | Status: DC

## 2017-12-07 ENCOUNTER — Ambulatory Visit (INDEPENDENT_AMBULATORY_CARE_PROVIDER_SITE_OTHER): Payer: Medicare Other

## 2017-12-07 ENCOUNTER — Telehealth: Payer: Self-pay

## 2017-12-07 ENCOUNTER — Encounter (HOSPITAL_COMMUNITY): Payer: Self-pay | Admitting: *Deleted

## 2017-12-07 DIAGNOSIS — I5022 Chronic systolic (congestive) heart failure: Secondary | ICD-10-CM

## 2017-12-07 DIAGNOSIS — Z95810 Presence of automatic (implantable) cardiac defibrillator: Secondary | ICD-10-CM | POA: Diagnosis not present

## 2017-12-07 NOTE — Telephone Encounter (Signed)
Attempted ICM call and patient returned call right away

## 2017-12-07 NOTE — Progress Notes (Signed)
EPIC Encounter for ICM Monitoring  Patient Name: Erin Jarvis is a 52 y.o. female Date: 12/07/2017 Primary Care Physican: [name], MD Primary Cardiologist:Hochrein/McLean Electrophysiologist: Allred Nephrologist: Dr Hollie Salk at [facility] Weight: 232lbs         Heart Failure questions reviewed, pt asymptomatic.     Thoracic impedance abnormal suggesting fluid accumulation.  Dr Claris Gladden office note on 8/30 suggests that Optivol may not be accurate due to patient's weight loss.  Optivol may need recalibrating but device is close to battery replacement which will develop a new Optivol baseline.     Prescribed dosage: Torsemide20 mgTake 1 tablet (20 mg total) by mouth daily as needed (For swelling, weight gain).  Labs: 10/14/2017 Creatinine 1.66, BUM 18, Potassium 4.0, Sodium 142, EGFR 34-39 09/23/2017 Creatinine 2.20, BUN 28, Potassium 4.3, Sodium 139, EGFR 24-28 09/18/2017 Creatinine 2.64, BUN 51, Potassium 3.8, Sodium 141, EGFR 20-23 09/01/2017 Creatinine 3.43, BUN 65, Potassium 3.5, Sodium 142, EGFR 14-17 07/22/2017 Creatinine 2.08, BUN 40, Potassium 3.3, Sodium 138, EGFR 26-30 07/10/2017 Creatinine 1.50, BUN 27, Potassium 4.2, Sodium 140, EGFR 39-45 (Care Everywhere) 07/07/2017 Creatinine 2.15, BUN 33, Potassium 3.9, Sodium 138, EGFR 25-29 06/11/2017 Creatinine 2.06, BUN 53, Potassium 4.3, Sodium 135, EGFR 27-31  Recommendations:  Advised to call for fluid symptoms.  Follow-up plan: ICM clinic phone appointment on 02/04/2018. Office appointment scheduled 01/01/2018 with Dr. Rayann Heman.    Copy of ICM check sent to Dr. Rayann Heman and Dr Aundra Dubin.   3 month ICM trend: 12/07/2017    1 Year ICM trend:       [name], RN 12/07/2017 2:13 PM

## 2017-12-08 ENCOUNTER — Other Ambulatory Visit (HOSPITAL_COMMUNITY): Payer: Medicare Other

## 2017-12-09 ENCOUNTER — Ambulatory Visit (HOSPITAL_COMMUNITY)
Admission: RE | Admit: 2017-12-09 | Discharge: 2017-12-09 | Disposition: A | Discharge status: Home / Self Care | Payer: Medicare Other | Source: Ambulatory Visit | Attending: Internal Medicine | Admitting: Internal Medicine

## 2017-12-09 DIAGNOSIS — I5022 Chronic systolic (congestive) heart failure: Secondary | ICD-10-CM | POA: Insufficient documentation

## 2017-12-09 LAB — BASIC METABOLIC PANEL
Anion gap: 9 (ref 5–15)
BUN: 28 mg/dL — ABNORMAL HIGH (ref 6–20)
CHLORIDE: 113 mmol/L — AB (ref 98–111)
CO2: 21 mmol/L — ABNORMAL LOW (ref 22–32)
CREATININE: 2 mg/dL — AB (ref 0.44–1.00)
Calcium: 9.3 mg/dL (ref 8.9–10.3)
GFR, EST AFRICAN AMERICAN: 31 mL/min — AB (ref >60)
GFR, EST NON AFRICAN AMERICAN: 27 mL/min — AB (ref >60)
Glucose, Bld: 129 mg/dL — ABNORMAL HIGH (ref 70–99)
Potassium: 3.8 mmol/L (ref 3.5–5.1)
SODIUM: 143 mmol/L (ref 135–145)

## 2017-12-14 ENCOUNTER — Telehealth (HOSPITAL_COMMUNITY): Payer: Self-pay | Admitting: *Deleted

## 2017-12-14 DIAGNOSIS — I5022 Chronic systolic (congestive) heart failure: Secondary | ICD-10-CM

## 2017-12-14 DIAGNOSIS — N183 Chronic kidney disease, stage 3 unspecified: Secondary | ICD-10-CM

## 2017-12-14 DIAGNOSIS — Z9884 Bariatric surgery status: Secondary | ICD-10-CM | POA: Diagnosis not present

## 2017-12-14 NOTE — Telephone Encounter (Signed)
-----   Message from Larey Dresser, MD sent at 12/10/2017  9:31 PM EDT ----- Creatinine is higher.  Make sure she is not taking torsemide.  Stop spironolactone for now.  Make sure no NSAIDs.  Repeat BMET 1 week.

## 2017-12-14 NOTE — Telephone Encounter (Signed)
Notes recorded by Scarlette Calico, RN on 12/14/2017 at 12:10 PM EDT Pt is aware, she states she has only taken Torsemide 2 times and it has been awhile since she did that, she denies NSAID use and states she just take Tylenol. She will stop Arlyce Harman for now, repeat lab sch for 9/26

## 2017-12-17 ENCOUNTER — Telehealth (HOSPITAL_COMMUNITY): Payer: Self-pay | Admitting: Pharmacist

## 2017-12-17 NOTE — Telephone Encounter (Signed)
[employer] patient assistance temporarily approved for 3 months as patient needs to apply for [place] and get denied for continued approval.  Doroteo Bradford K. Velva Harman, PharmD, BCPS, CPP Clinical Pharmacist Phone: [phone] 12/17/2017 10:58 AM

## 2017-12-24 ENCOUNTER — Ambulatory Visit (HOSPITAL_COMMUNITY)
Admission: RE | Admit: 2017-12-24 | Discharge: 2017-12-24 | Disposition: A | Discharge status: Home / Self Care | Payer: Medicare Other | Source: Ambulatory Visit | Attending: Cardiology | Admitting: Cardiology

## 2017-12-24 DIAGNOSIS — I5022 Chronic systolic (congestive) heart failure: Secondary | ICD-10-CM | POA: Diagnosis not present

## 2017-12-24 DIAGNOSIS — N183 Chronic kidney disease, stage 3 unspecified: Secondary | ICD-10-CM

## 2017-12-24 LAB — BASIC METABOLIC PANEL
ANION GAP: 8 (ref 5–15)
BUN: 18 mg/dL (ref 6–20)
CHLORIDE: 113 mmol/L — AB (ref 98–111)
CO2: 20 mmol/L — AB (ref 22–32)
Calcium: 9 mg/dL (ref 8.9–10.3)
Creatinine, Ser: 1.44 mg/dL — ABNORMAL HIGH (ref 0.44–1.00)
GFR, EST AFRICAN AMERICAN: 47 mL/min — AB (ref >60)
GFR, EST NON AFRICAN AMERICAN: 40 mL/min — AB (ref >60)
Glucose, Bld: 89 mg/dL (ref 70–99)
POTASSIUM: 4.4 mmol/L (ref 3.5–5.1)
Sodium: 141 mmol/L (ref 135–145)

## 2017-12-25 ENCOUNTER — Telehealth (HOSPITAL_COMMUNITY): Payer: Self-pay

## 2017-12-25 NOTE — Telephone Encounter (Signed)
Pt called with no answer voice mail was left for pt to call back

## 2018-01-01 ENCOUNTER — Encounter: Payer: Self-pay | Admitting: Internal Medicine

## 2018-01-01 ENCOUNTER — Ambulatory Visit (INDEPENDENT_AMBULATORY_CARE_PROVIDER_SITE_OTHER): Payer: Medicare Other | Admitting: Internal Medicine

## 2018-01-01 VITALS — BP 132/60 | HR 61 | Ht 66.0 in | Wt 226.0 lb

## 2018-01-01 DIAGNOSIS — I428 Other cardiomyopathies: Secondary | ICD-10-CM | POA: Diagnosis not present

## 2018-01-01 DIAGNOSIS — I5022 Chronic systolic (congestive) heart failure: Secondary | ICD-10-CM | POA: Diagnosis not present

## 2018-01-01 DIAGNOSIS — I472 Ventricular tachycardia, unspecified: Secondary | ICD-10-CM

## 2018-01-01 DIAGNOSIS — Z23 Encounter for immunization: Secondary | ICD-10-CM

## 2018-01-01 DIAGNOSIS — Z95810 Presence of automatic (implantable) cardiac defibrillator: Secondary | ICD-10-CM | POA: Diagnosis not present

## 2018-01-01 NOTE — Progress Notes (Signed)
PCP: [name], MD Primary Cardiologist: Dr Percival Spanish CHF: Dr Aundra Dubin Primary EP: Dr Rayann Heman  Erin Jarvis is a 52 y.o. female who presents today for routine electrophysiology followup.  Since last being seen in our clinic, the patient reports doing very well.  She had weight loss surgery in March.  Today, she denies symptoms of palpitations, chest pain, shortness of breath,  lower extremity edema, dizziness, presyncope, syncope, or ICD shocks.  The patient is otherwise without complaint today.   Past Medical History:  Diagnosis Date  . AICD (automatic cardioverter/defibrillator) present 2002, 2010   secondary to post-partum cardiomyopathy (1996)  . CHF (congestive heart failure) ([place])   . Diabetes mellitus without complication ([place])   . Hypertension   . Hypertensive cardiovascular disease   . Obesity   . Persistent atrial fibrillation   . Postpartum cardiomyopathy    Last echocardiogram was last year and EF 45 -50 %  . Seasonal allergies   . Sleep apnea    complaint with CPAP  . Ventricular tachycardia (HCC)    s/p ICD Implantation (MDT Secura VR)   Past Surgical History:  Procedure Laterality Date  . CARDIAC CATHETERIZATION N/A 04/11/2016   Procedure: Right/Left Heart Cath and Coronary Angiography;  Surgeon: Larey Dresser, MD;  Location: [facility];  Service: Cardiovascular;  Laterality: N/A;  . CARDIAC DEFIBRILLATOR PLACEMENT  08/12/2000, 11/11/2008   Most recent Generator MDT Secura VR placed in [place]  . CESAREAN SECTION  1996  . CHOLECYSTECTOMY  2004  . IR GENERIC HISTORICAL  06/13/2014   IR RADIOLOGIST EVAL & MGMT 06/13/2014 Greggory Keen, MD [facility] INTERV RAD  . IR GENERIC HISTORICAL  04/09/2016   IR FLUORO GUIDE CV LINE LEFT 04/09/2016 Greggory Keen, MD [facility]  . IR GENERIC HISTORICAL  04/09/2016   IR US GUIDE VASC ACCESS RIGHT 04/09/2016 Greggory Keen, MD [facility]  . IR GENERIC HISTORICAL  04/09/2016   IR VENO/EXT/UNI RIGHT 04/09/2016  Greggory Keen, MD [facility]  . IR GENERIC HISTORICAL  04/09/2016   IR US GUIDE VASC ACCESS LEFT 04/09/2016 Greggory Keen, MD [facility]  . TEE WITHOUT CARDIOVERSION N/A 04/08/2016   Procedure: TRANSESOPHAGEAL ECHOCARDIOGRAM (TEE);  Surgeon: Pixie Casino, MD;  Location: [facility] ENDOSCOPY;  Service: Cardiovascular;  Laterality: N/A;    ROS- all systems are reviewed and negative except as per HPI above  Current Outpatient Medications  Medication Sig Dispense Refill  . apixaban (ELIQUIS) 5 MG TABS tablet Take 1 tablet (5 mg total) by mouth 2 (two) times daily. 180 tablet 3  . Calcium Citrate-Vitamin D (CALCIUM CITRATE + D) 250-200 MG-UNIT TABS Take 1 tablet by mouth daily.    . citalopram (CELEXA) 10 MG tablet Take 10 mg by mouth daily.     . ferrous sulfate 325 (65 FE) MG tablet Take 325 mg by mouth daily with breakfast.    . levothyroxine (SYNTHROID, LEVOTHROID) 150 MCG tablet Take 150 mcg by mouth daily before breakfast.     . metoprolol succinate (TOPROL-XL) 100 MG 24 hr tablet TAKE ONE TABLET (100 MG) TWICE DAILY 60 tablet 6  . Multiple Vitamin (MULTIVITAMIN) tablet Take 1 tablet by mouth daily.    . pantoprazole (PROTONIX) 40 MG tablet Take 40 mg by mouth daily.    . promethazine (PHENERGAN) 25 MG tablet Take 25 mg by mouth every 8 (eight) hours as needed for nausea or vomiting.    . sacubitril-valsartan (ENTRESTO) 24-26 MG Take 1 tablet by mouth  2 (two) times daily. 60 tablet 11  . torsemide (DEMADEX) 20 MG tablet Take 1 tablet (20 mg total) by mouth daily as needed (For swelling, weight gain). 30 tablet 11  . vitamin B-12 (CYANOCOBALAMIN) 1000 MCG tablet Take 1,000 mcg by mouth daily.     No current facility-administered medications for this visit.     Physical Exam: Vitals:   01/01/18 0922  BP: 132/60  Pulse: 61  SpO2: 97%  Weight: 226 lb (102.5 kg)  Height: '5\' 6"'  (1.676 m)    GEN- The patient is overweight appearing, alert and oriented x 3 today.   Head- normocephalic,  atraumatic Eyes-  Sclera clear, conjunctiva pink Ears- hearing intact Oropharynx- clear Lungs- Clear to ausculation bilaterally, normal work of breathing Chest- ICD pocket is well healed Heart- Regular rate and rhythm, no murmurs, rubs or gallops, PMI not laterally displaced GI- soft, NT, ND, + BS Extremities- no clubbing, cyanosis, or edema  ICD interrogation- reviewed in detail today,  See PACEART report  ekg tracing 11/27/17 is personally reviewed and reveals sinus rhythm, LAHB  Wt Readings from Last 3 Encounters:  01/01/18 226 lb (102.5 kg)  11/27/17 232 lb (105.2 kg)  10/14/17 236 lb (107 kg)    Assessment and Plan:  1.  Chronic systolic dysfunction/ nonischemic CM/ post partum CM euvolemic today EF 40-45% (improved from 20%) Stable on an appropriate medical regimen She has reached ERI.  Her RV lead is from 2002.  The lead is starting to show some wear, with lower R waves and elevated threshold but is still functional.  We had a long discussion today about options of gen change only, RV lead replacement, and also possible extraction.  We discussed risks of each approach at length.  Risks, benefits, and alternatives were discussed in detail today.  I have also spoken with Dr Lovena Le today.  At this point, I think that it may be best to have her sit down in consultation with Dr Lovena Le to discuss lead extraction with new lead placement.  Once she has met with him, she can better decided which direction to proceed.    See Claudia Desanctis Art report No changes today followed in ICM device clinic  2. VT Well controlled Appropriate therapy for VT 04/29/16, none since  3. Obesity Body mass index is 36.48 kg/m. Wt Readings from Last 3 Encounters:  01/01/18 226 lb (102.5 kg)  11/27/17 232 lb (105.2 kg)  10/14/17 236 lb (107 kg)   She continues to work diligently at weight loss. She did have weight loss surgery.  She has lost 50 lbs over the past year!  4. OSA uses CPAP  5. Persistent  afib Maintaining sinus rhythm Would hold eliquis 24 hours prior to any EP procedures coming up  Schedule follow-up with Dr Lovena Le in [place] at the next available time.  Thompson Grayer MD, [facility] 01/01/2018 9:54 AM

## 2018-01-01 NOTE — Patient Instructions (Signed)
Medication Instructions:  Continue all current medications.  Labwork: none  Testing/Procedures: none  Follow-Up: To be determined after generator change.      Any Other Special Instructions Will Be Listed Below (If Applicable). Remote monitoring is used to monitor your Pacemaker of ICD from home. This monitoring reduces the number of office visits required to check your device to one time per year. It allows [place] to keep an eye on the functioning of your device to ensure it is working properly. You are scheduled for a device check from home on 02/04/2018. You may send your transmission at any time that day. If you have a wireless device, the transmission will be sent automatically. After your physician reviews your transmission, you will receive a postcard with your next transmission date.  If you need a refill on your cardiac medications before your next appointment, please call your pharmacy.

## 2018-01-03 LAB — CUP PACEART INCLINIC DEVICE CHECK
Brady Statistic RV Percent Paced: 0.05 %
Date Time Interrogation Session: 20191004140226
HIGH POWER IMPEDANCE MEASURED VALUE: 77 Ohm
HighPow Impedance: 54 Ohm
Implantable Lead Implant Date: 20100824
Implantable Lead Location: 753860
Lead Channel Pacing Threshold Amplitude: 1.5 V
Lead Channel Sensing Intrinsic Amplitude: 3.5 mV
Lead Channel Setting Pacing Pulse Width: 1 ms
MDC IDC MSMT BATTERY VOLTAGE: 2.61 V
MDC IDC MSMT LEADCHNL RV IMPEDANCE VALUE: 418 Ohm
MDC IDC MSMT LEADCHNL RV PACING THRESHOLD PULSEWIDTH: 1 ms
MDC IDC PG IMPLANT DT: 20100824
MDC IDC SET LEADCHNL RV PACING AMPLITUDE: 3 V
MDC IDC SET LEADCHNL RV SENSING SENSITIVITY: 0.3 mV

## 2018-01-20 ENCOUNTER — Other Ambulatory Visit: Payer: Self-pay | Admitting: Cardiology

## 2018-01-27 ENCOUNTER — Ambulatory Visit
Admission: RE | Admit: 2018-01-27 | Discharge: 2018-01-27 | Disposition: A | Discharge status: Home / Self Care | Payer: Medicare Other | Source: Ambulatory Visit | Attending: Internal Medicine | Admitting: Internal Medicine

## 2018-01-27 ENCOUNTER — Ambulatory Visit (INDEPENDENT_AMBULATORY_CARE_PROVIDER_SITE_OTHER): Payer: Medicare Other | Admitting: Internal Medicine

## 2018-01-27 ENCOUNTER — Encounter: Payer: Self-pay | Admitting: Internal Medicine

## 2018-01-27 VITALS — BP 108/68 | HR 59 | Ht 67.0 in | Wt 217.4 lb

## 2018-01-27 DIAGNOSIS — I472 Ventricular tachycardia, unspecified: Secondary | ICD-10-CM

## 2018-01-27 DIAGNOSIS — I4819 Other persistent atrial fibrillation: Secondary | ICD-10-CM

## 2018-01-27 DIAGNOSIS — I5022 Chronic systolic (congestive) heart failure: Secondary | ICD-10-CM | POA: Diagnosis not present

## 2018-01-27 DIAGNOSIS — I428 Other cardiomyopathies: Secondary | ICD-10-CM

## 2018-01-27 DIAGNOSIS — Z95810 Presence of automatic (implantable) cardiac defibrillator: Secondary | ICD-10-CM | POA: Diagnosis not present

## 2018-01-27 NOTE — Progress Notes (Signed)
HPI Erin Jarvis is referred today by Dr. Rayann Heman for evaluation of her ICD which has had evidence of lead malfunction. She is a pleasant 52 yo woman with peripartum CM, s/p ICD insertion about 18 years ago. She has had one gen change and is now ERI. She is referred as the patient was noted to have worsening RV ICD lead function. She also has known VT. Her CHF symptoms are class 2-3 and she is on maximal medication. She has had no syncope. She has a [id number] medtronic ICD lead. She has no chest Xray in the system.  Allergies  Allergen Reactions  . Morphine And Related Nausea Only and Other (See Comments)    Feels sick,and like she is going to pass out; can't breathe, starts sweating  . Atorvastatin     Nitemares     Current Outpatient Medications  Medication Sig Dispense Refill  . apixaban (ELIQUIS) 5 MG TABS tablet Take 1 tablet (5 mg total) by mouth 2 (two) times daily. 180 tablet 3  . Calcium Citrate-Vitamin D (CALCIUM CITRATE + D) 250-200 MG-UNIT TABS Take 1 tablet by mouth daily.    . citalopram (CELEXA) 10 MG tablet Take 10 mg by mouth daily.     . ferrous sulfate 325 (65 FE) MG tablet Take 325 mg by mouth daily with breakfast.    . levothyroxine (SYNTHROID, LEVOTHROID) 150 MCG tablet Take 150 mcg by mouth daily before breakfast.     . metoprolol succinate (TOPROL-XL) 100 MG 24 hr tablet TAKE ONE TABLET (100 MG) TWICE DAILY 60 tablet 6  . Multiple Vitamin (MULTIVITAMIN) tablet Take 1 tablet by mouth daily.    . pantoprazole (PROTONIX) 40 MG tablet Take 40 mg by mouth daily.    . promethazine (PHENERGAN) 25 MG tablet Take 25 mg by mouth every 8 (eight) hours as needed for nausea or vomiting.    . sacubitril-valsartan (ENTRESTO) 24-26 MG Take 1 tablet by mouth 2 (two) times daily. 60 tablet 11  . torsemide (DEMADEX) 20 MG tablet Take 1 tablet (20 mg total) by mouth daily as needed (For swelling, weight gain). 30 tablet 11  . vitamin B-12 (CYANOCOBALAMIN) 1000 MCG tablet Take 1,000  mcg by mouth daily.     No current facility-administered medications for this visit.      Past Medical History:  Diagnosis Date  . AICD (automatic cardioverter/defibrillator) present 2002, 2010   secondary to post-partum cardiomyopathy (1996)  . CHF (congestive heart failure) ([place])   . Diabetes mellitus without complication ([place])   . Hypertension   . Hypertensive cardiovascular disease   . Obesity   . Persistent atrial fibrillation   . Postpartum cardiomyopathy    Last echocardiogram was last year and EF 45 -50 %  . Seasonal allergies   . Sleep apnea    complaint with CPAP  . Ventricular tachycardia (HCC)    s/p ICD Implantation (MDT Secura VR)    ROS:   All systems reviewed and negative except as noted in the HPI.   Past Surgical History:  Procedure Laterality Date  . CARDIAC CATHETERIZATION N/A 04/11/2016   Procedure: Right/Left Heart Cath and Coronary Angiography;  Surgeon: Larey Dresser, MD;  Location: [facility];  Service: Cardiovascular;  Laterality: N/A;  . CARDIAC DEFIBRILLATOR PLACEMENT  08/12/2000, 11/11/2008   Most recent Generator MDT Secura VR placed in [place]  . CESAREAN SECTION  1996  . CHOLECYSTECTOMY  2004  . IR GENERIC HISTORICAL  06/13/2014  IR RADIOLOGIST EVAL & MGMT 06/13/2014 Greggory Keen, MD [facility] INTERV RAD  . IR GENERIC HISTORICAL  04/09/2016   IR FLUORO GUIDE CV LINE LEFT 04/09/2016 Greggory Keen, MD [facility]  . IR GENERIC HISTORICAL  04/09/2016   IR US GUIDE VASC ACCESS RIGHT 04/09/2016 Greggory Keen, MD [facility]  . IR GENERIC HISTORICAL  04/09/2016   IR VENO/EXT/UNI RIGHT 04/09/2016 Greggory Keen, MD [facility]  . IR GENERIC HISTORICAL  04/09/2016   IR US GUIDE VASC ACCESS LEFT 04/09/2016 Greggory Keen, MD [facility]  . TEE WITHOUT CARDIOVERSION N/A 04/08/2016   Procedure: TRANSESOPHAGEAL ECHOCARDIOGRAM (TEE);  Surgeon: Pixie Casino, MD;  Location: [facility] ENDOSCOPY;  Service: Cardiovascular;  Laterality: N/A;     Family  History  Problem Relation Age of Onset  . Other Mother        has chronic pain syndrome from DDD with spinal cord stimulator  . Hypertension Mother   . COPD Father   . Arthritis/Rheumatoid Father      Social History   Socioeconomic History  . Marital status: Divorced    Spouse name: Not on file  . Number of children: Not on file  . Years of education: Not on file  . Highest education level: Not on file  Occupational History  . Not on file  Social Needs  . Financial resource strain: Not on file  . Food insecurity:    Worry: Not on file    Inability: Not on file  . Transportation needs:    Medical: Not on file    Non-medical: Not on file  Tobacco Use  . Smoking status: Never Smoker  . Smokeless tobacco: Never Used  Substance and Sexual Activity  . Alcohol use: No    Alcohol/week: 0.0 standard drinks  . Drug use: No  . Sexual activity: Not on file  Lifestyle  . Physical activity:    Days per week: Not on file    Minutes per session: Not on file  . Stress: Not on file  Relationships  . Social connections:    Talks on phone: Not on file    Gets together: Not on file    Attends religious service: Not on file    Active member of club or organization: Not on file    Attends meetings of clubs or organizations: Not on file    Relationship status: Not on file  . Intimate partner violence:    Fear of current or ex partner: Not on file    Emotionally abused: Not on file    Physically abused: Not on file    Forced sexual activity: Not on file  Other Topics Concern  . Not on file  Social History Narrative   Lives with her parents and 7 year old daughter in [place].     BP 108/68   Pulse (!) 59   Ht 5\' 7"  (1.702 m)   Wt 217 lb 6.4 oz (98.6 kg)   LMP 03/14/2014 Comment: neg preg test  SpO2 97%   BMI 34.05 kg/m   Physical Exam:  Well appearing but obese middle aged woman, NAD HEENT: Unremarkable Neck:  6 cm JVD, no thyromegally Lymphatics:  No  adenopathy Back:  No CVA tenderness Lungs:  Clear with no wheezes HEART:  Regular rate rhythm, no murmurs, no rubs, no clicks Abd:  soft, positive bowel sounds, no organomegally, no rebound, no guarding Ext:  2 plus pulses, no edema, no cyanosis, no clubbing Skin:  No rashes no  nodules Neuro:  CN II through XII intact, motor grossly intact  EKG - sinus bradycardia  DEVICE  Normal device function.  See PaceArt for details. Reviewed.  Assess/Plan: 1. ICD - [facility] device [facility] at [facility]. I have recommneded she undergo CXR to look at her lead. Based on her comorbidities and age, and severity of her CHF symptoms, I think the best approach would be to try and place another ICD lead. Her comorbidities would sugest that is the best approach. If she has an occluded vein on venography, then extraction would be suggested. 2. VT - she has been stable and will continue her current medical therapy.  3. Chronic systolic heart failure - she appears euvolemic on torsemide. I spent over 25 minutes including 50% face to face time with this patient.  Mikle Bosworth.D.

## 2018-01-27 NOTE — Patient Instructions (Signed)
Medication Instructions:  Your physician recommends that you continue on your current medications as directed. Please refer to the Current Medication list given to you today.  If you need a refill on your cardiac medications before your next appointment, please call your pharmacy.   Lab work: None ordered.  If you have labs (blood work) drawn today and your tests are completely normal, you will receive your results only by: Marland Kitchen MyChart Message (if you have MyChart) OR . A paper copy in the mail If you have any lab test that is abnormal or we need to change your treatment, we will call you to review the results.  Testing/Procedures: A chest x-ray takes a picture of the organs and structures inside the chest, including the heart, lungs, and blood vessels. This test can show several things, including, whether the heart is enlarges; whether fluid is building up in the lungs; and whether pacemaker / defibrillator leads are still in place.  Please obtain a chest xray.  Follow-Up:  I will call you with a plan after your chest xray.  Remote monitoring is used to monitor your ICD from home. This monitoring reduces the number of office visits required to check your device to one time per year. It allows [place] to keep an eye on the functioning of your device to ensure it is working properly. You are scheduled for a device check from home on 02/04/2018. You may send your transmission at any time that day. If you have a wireless device, the transmission will be sent automatically. After your physician reviews your transmission, you will receive a postcard with your next transmission date.  At [facility], you and your health needs are our priority.  As part of our continuing mission to provide you with exceptional heart care, we have created designated Provider Care Teams.  These Care Teams include your primary Cardiologist (physician) and Advanced Practice Providers (APPs -  Physician Assistants and Nurse  Practitioners) who all work together to provide you with the care you need, when you need it.  Any Other Special Instructions Will Be Listed Below (If Applicable).

## 2018-01-28 ENCOUNTER — Telehealth (HOSPITAL_COMMUNITY): Payer: Self-pay | Admitting: Pharmacist

## 2018-01-28 NOTE — Telephone Encounter (Signed)
Patient was asking about Entresto samples at [place]. She was approved for 3 months worth of assistance through [employer] in September. I have left her a VM to call [employer] at [phone] to verify her shipping address so that it can be mailed to her home.   Ruta Hinds. Velva Harman, PharmD, BCPS, CPP Clinical Pharmacist Phone: [phone] 01/28/2018 11:17 AM

## 2018-01-29 DIAGNOSIS — M25561 Pain in right knee: Secondary | ICD-10-CM | POA: Diagnosis not present

## 2018-02-04 ENCOUNTER — Ambulatory Visit (INDEPENDENT_AMBULATORY_CARE_PROVIDER_SITE_OTHER): Payer: Medicare Other

## 2018-02-04 ENCOUNTER — Ambulatory Visit (INDEPENDENT_AMBULATORY_CARE_PROVIDER_SITE_OTHER): Payer: Medicare Other | Admitting: *Deleted

## 2018-02-04 DIAGNOSIS — I428 Other cardiomyopathies: Secondary | ICD-10-CM

## 2018-02-04 DIAGNOSIS — I5022 Chronic systolic (congestive) heart failure: Secondary | ICD-10-CM

## 2018-02-04 DIAGNOSIS — Z95810 Presence of automatic (implantable) cardiac defibrillator: Secondary | ICD-10-CM

## 2018-02-04 NOTE — Progress Notes (Signed)
Remote ICD transmission.   

## 2018-02-05 ENCOUNTER — Telehealth: Payer: Self-pay

## 2018-02-05 NOTE — Progress Notes (Signed)
EPIC Encounter for ICM Monitoring  Patient Name: Erin Jarvis is a 52 y.o. female Date: 02/05/2018 Primary Care Physican: [name], MD Primary Cardiologist:[facility] Electrophysiologist: [place] Nephrologist: Dr Hollie Salk at [facility] Last Weight:  232lbs Today's Weight:  unknown  Battery: 2.63   Attempted call to patient and unable to reach.    Transmission reviewed.    Thoracic impedance thought to be inaccurate and being recalibrated 02/12/2018  Prescribed: Torsemide20 mgTake 1 tablet (20 mg total) by mouth daily as needed (For swelling, weight gain).  Labs: 10/14/2017 Creatinine 1.66, BUM 18, Potassium 4.0, Sodium 142, EGFR 34-39 09/23/2017 Creatinine 2.20, BUN 28, Potassium 4.3, Sodium 139, EGFR 24-28 09/18/2017 Creatinine 2.64, BUN 51, Potassium 3.8, Sodium 141, EGFR 20-23 09/01/2017 Creatinine 3.43, BUN 65, Potassium 3.5, Sodium 142, EGFR 14-17 07/22/2017 Creatinine 2.08, BUN 40, Potassium 3.3, Sodium 138, EGFR 26-30 07/10/2017 Creatinine 1.50, BUN 27, Potassium 4.2, Sodium 140, EGFR 39-45 (Care Everywhere) 07/07/2017 Creatinine 2.15, BUN 33, Potassium 3.9, Sodium 138, EGFR 25-29 06/11/2017 Creatinine 2.06, BUN 53, Potassium 4.3, Sodium 135, EGFR 27-31  Recommendations:  Unable to reach.  Follow-up plan: ICM clinic phone appointment on 03/09/2018.   Office appointment scheduled 02/12/2018 with Dr. Aundra Dubin.    Copy of ICM check sent to Dr. Rayann Heman.   3 month ICM trend: 02/04/2018    1 Year ICM trend:       [name], RN 02/05/2018 8:52 AM

## 2018-02-05 NOTE — Telephone Encounter (Signed)
Remote ICM transmission received.  Attempted call to patient regarding ICM remote transmission and no answer.  

## 2018-02-07 ENCOUNTER — Encounter: Payer: Self-pay | Admitting: Cardiology

## 2018-02-12 ENCOUNTER — Telehealth (HOSPITAL_COMMUNITY): Payer: Self-pay

## 2018-02-12 ENCOUNTER — Ambulatory Visit (HOSPITAL_COMMUNITY)
Admission: RE | Admit: 2018-02-12 | Discharge: 2018-02-12 | Disposition: A | Discharge status: Home / Self Care | Payer: Medicare Other | Source: Ambulatory Visit | Attending: Cardiology | Admitting: Cardiology

## 2018-02-12 VITALS — BP 158/92 | HR 73 | Wt 221.6 lb

## 2018-02-12 DIAGNOSIS — Z79890 Hormone replacement therapy (postmenopausal): Secondary | ICD-10-CM | POA: Diagnosis not present

## 2018-02-12 DIAGNOSIS — N183 Chronic kidney disease, stage 3 (moderate): Secondary | ICD-10-CM | POA: Diagnosis not present

## 2018-02-12 DIAGNOSIS — Z9884 Bariatric surgery status: Secondary | ICD-10-CM | POA: Diagnosis not present

## 2018-02-12 DIAGNOSIS — Z79899 Other long term (current) drug therapy: Secondary | ICD-10-CM | POA: Diagnosis not present

## 2018-02-12 DIAGNOSIS — E0580 Other thyrotoxicosis without thyrotoxic crisis or storm: Secondary | ICD-10-CM | POA: Diagnosis not present

## 2018-02-12 DIAGNOSIS — I130 Hypertensive heart and chronic kidney disease with heart failure and stage 1 through stage 4 chronic kidney disease, or unspecified chronic kidney disease: Secondary | ICD-10-CM | POA: Insufficient documentation

## 2018-02-12 DIAGNOSIS — M109 Gout, unspecified: Secondary | ICD-10-CM | POA: Insufficient documentation

## 2018-02-12 DIAGNOSIS — Z95810 Presence of automatic (implantable) cardiac defibrillator: Secondary | ICD-10-CM | POA: Insufficient documentation

## 2018-02-12 DIAGNOSIS — E039 Hypothyroidism, unspecified: Secondary | ICD-10-CM | POA: Diagnosis not present

## 2018-02-12 DIAGNOSIS — Z7901 Long term (current) use of anticoagulants: Secondary | ICD-10-CM | POA: Diagnosis not present

## 2018-02-12 DIAGNOSIS — I5023 Acute on chronic systolic (congestive) heart failure: Secondary | ICD-10-CM

## 2018-02-12 DIAGNOSIS — I480 Paroxysmal atrial fibrillation: Secondary | ICD-10-CM | POA: Diagnosis not present

## 2018-02-12 DIAGNOSIS — G4733 Obstructive sleep apnea (adult) (pediatric): Secondary | ICD-10-CM | POA: Insufficient documentation

## 2018-02-12 DIAGNOSIS — I428 Other cardiomyopathies: Secondary | ICD-10-CM | POA: Diagnosis not present

## 2018-02-12 DIAGNOSIS — I4891 Unspecified atrial fibrillation: Secondary | ICD-10-CM | POA: Diagnosis not present

## 2018-02-12 DIAGNOSIS — E1122 Type 2 diabetes mellitus with diabetic chronic kidney disease: Secondary | ICD-10-CM | POA: Insufficient documentation

## 2018-02-12 DIAGNOSIS — Z8249 Family history of ischemic heart disease and other diseases of the circulatory system: Secondary | ICD-10-CM | POA: Insufficient documentation

## 2018-02-12 DIAGNOSIS — I4892 Unspecified atrial flutter: Secondary | ICD-10-CM | POA: Insufficient documentation

## 2018-02-12 DIAGNOSIS — I5022 Chronic systolic (congestive) heart failure: Secondary | ICD-10-CM | POA: Insufficient documentation

## 2018-02-12 LAB — BASIC METABOLIC PANEL
ANION GAP: 9 (ref 5–15)
BUN: 10 mg/dL (ref 6–20)
CALCIUM: 9 mg/dL (ref 8.9–10.3)
CO2: 20 mmol/L — AB (ref 22–32)
CREATININE: 1.29 mg/dL — AB (ref 0.44–1.00)
Chloride: 111 mmol/L (ref 98–111)
GFR calc Af Amer: 53 mL/min — ABNORMAL LOW (ref >60)
GFR calc non Af Amer: 46 mL/min — ABNORMAL LOW (ref >60)
Glucose, Bld: 87 mg/dL (ref 70–99)
Potassium: 3.8 mmol/L (ref 3.5–5.1)
Sodium: 140 mmol/L (ref 135–145)

## 2018-02-12 LAB — TSH: TSH: <0.01 u[IU]/mL — ABNORMAL LOW (ref 0.350–4.500)

## 2018-02-12 MED ORDER — LEVOTHYROXINE SODIUM 100 MCG PO TABS: 100.0000 ug | ORAL_TABLET | Freq: Every day | ORAL | 6 refills | Status: DC

## 2018-02-12 MED ORDER — SPIRONOLACTONE 25 MG PO TABS: 12.5000 mg | ORAL_TABLET | Freq: Every day | ORAL | 11 refills | Status: DC

## 2018-02-12 NOTE — Telephone Encounter (Signed)
Pt called no answer voice mail left for pt to call back 

## 2018-02-12 NOTE — Patient Instructions (Addendum)
INCREASE Entresto 24/26mg  (1 tab) to twice a day  START Spironolactone 12.5mg  (0.5 tab) daily  DECREASE Levothyroxine (Synthroid) 150mcg (1 tab) daily before breakfast.    Labs today We will only contact you if something comes back abnormal or we need to make some changes. Otherwise no news is good news!  Your physician request that you have labs drawn in 10 days and 1 month. You were provided with an appointment today for both.  Your physician recommends that you schedule a follow-up appointment in: 1 month with Pharmacist for medication titration and 3 month with Dr. Aundra Dubin.

## 2018-02-14 NOTE — Progress Notes (Signed)
PCP: Dr. Sherrie Sport HF Cardiology: Dr. Aundra Dubin  Erin Jarvis is a 52 y.o. 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 At one point EF improved to 45% from 20%.  She also has a history VT with Medtronic ICD placed in [place]. In 12/17, she was  admitted to [facility] with new onset atrial fibrillation/RVR and volume overload. Required IV diuresis and cardizem drip.  She remained in atrial fibrillation.  Warfarin was started that admission.   On 03/26/16, she saw Dr Percival Spanish and she was set up TEE/DCCV. Creatinine at that time was 2.03.  She had had a functional decline and dyspnea at rest. Weight at home had been trending up from 290 to 309 pounds.  SBP in 90s at Dr. Rosezella [place] office.   On 04/08/16, she presented for scheduled TEE/DC-CV, however thrombus was noted in LA appendage so DC-CV was not pursued. TEE also showed severe LV dysfunction. Dyspneic at rest. SBP soft.  She was admitted and ultimately required initiation of milrinone gtt + diuresis with IV Lasix and metolazone.  She lost > 20 lbs.  We were able to titrate her off milrinone and she was sent home on torsemide.  Rate was difficult to control, Toprol XL was titrated up to 75 mg bid.   At appointment in 2/18, she was noted to be back in NSR.  She remains in NSR today.   She had gastric bypass in 3/19 at [facility].   She saw Dr. Rayann Heman for evaluation for possible afib ablation.  He recommended initial weight loss + amiodarone, then try to stop amiodarone after she has lost a fair amount of weight. She lost weight and is now off amiodarone.  Echo in 8/19 showed EF 40-45%.   She returns for followup of CHF.  Weight is down another 11 lbs.  She feels good overall, using torsemide occasionally when weight goes up. No significant exertional dyspnea. She is limited mainly by knee pain.  No chest pain.  Occasional lightheadedness if she sits for a long time then stands.  No orthopnea/PND. TSH has  been consistently low on current Levoxyl dose.   Labs (1/18): digoxin 0.5, K 4.5, creatinine 2.13, digoxin 1.7, AST 53, ALT 34, TSH elevated Labs (2/18): K 3.9, creatinine 1.98 => 1.6, TSH 14, normal free T3 and free T4, LFTs normal, digoxin 1.7 Labs (3/18): K 3.8, creatinine 1.8, digoxin 1.0 Labs (12/18): K 4, creatinine 1.62 Labs (3/19): creatinine 2.06 Labs (7/19): K 4, creatinine 1.66 Labs (8/19): hgb 12.7 Labs (9/19): K 4.4, creatinine 1.44  ECG (personally reviewed): sinus brady at 59, anterior Qs  PMH: 1. Chronic systolic CHF: Nonischemic cardiomyopathy.  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  EF initially 20%, rose to 45% at one point.   - h/o VT => Medtronic ICD.  - Echo (1/18): EF 25-30%, mildly dilated RV with normal systolic function, moderate TR, PASP 31 mmHg.  - LHC/RHC (1/18): No CAD; mean RA 19, PA 46/20 mean 35, mean PCWP 24, CI 2.37 (Thermo), CI 3.16 (Fick), PVR 1.9 WU.  - Echo (8/19): EF 40-45%, severe LV dilation, no significant MR 2. Atrial fibrillation: Persistent.  Diagnosed 12/17 when she presented to [facility] with afib/RVR.  TEE/DCCV set up in 1/18 but no DCCV due to LA thrombus on TEE.   3. Gout 4. Type II diabetes 5. HTN 6. OSA: Uses CPAP 7. CKD stage III: Has history of atrophic left kidney  8. Subclinical hypothyroidism 9. VT: 1/18,  had ICD discharge.  10. Gastric bypass 3/19.   SH: Divorced, on disability, lives with parents in [place], nonsmoker, no ETOH, no drugs.   Family History  Problem Relation Age of Onset  . Other Mother        has chronic pain syndrome from DDD with spinal cord stimulator  . Hypertension Mother   . COPD Father   . Arthritis/Rheumatoid Father    ROS: All systems reviewed and negative except as per HPI.   Current Outpatient Medications  Medication Sig Dispense Refill  . apixaban (ELIQUIS) 5 MG TABS tablet Take 1 tablet (5 mg total) by mouth 2 (two) times daily. 180 tablet 3  . Calcium  Citrate-Vitamin D (CALCIUM CITRATE + D) 250-200 MG-UNIT TABS Take 1 tablet by mouth daily.    . citalopram (CELEXA) 10 MG tablet Take 10 mg by mouth daily.     . ferrous sulfate 325 (65 FE) MG tablet Take 325 mg by mouth daily with breakfast.    . metoprolol succinate (TOPROL-XL) 100 MG 24 hr tablet TAKE ONE TABLET (100 MG) TWICE DAILY 60 tablet 6  . Multiple Vitamin (MULTIVITAMIN) tablet Take 1 tablet by mouth daily.    . pantoprazole (PROTONIX) 40 MG tablet Take 40 mg by mouth daily.    . promethazine (PHENERGAN) 25 MG tablet Take 25 mg by mouth every 8 (eight) hours as needed for nausea or vomiting.    . sacubitril-valsartan (ENTRESTO) 24-26 MG Take 1 tablet by mouth 2 (two) times daily. (Patient taking differently: Take 1 tablet by mouth daily. ) 60 tablet 11  . torsemide (DEMADEX) 20 MG tablet Take 1 tablet (20 mg total) by mouth daily as needed (For swelling, weight gain). 30 tablet 11  . vitamin B-12 (CYANOCOBALAMIN) 1000 MCG tablet Take 1,000 mcg by mouth daily.    Marland Kitchen levothyroxine (SYNTHROID) 100 MCG tablet Take 1 tablet (100 mcg total) by mouth daily before breakfast. 30 tablet 6  . spironolactone (ALDACTONE) 25 MG tablet Take 0.5 tablets (12.5 mg total) by mouth daily. 15 tablet 11   No current facility-administered medications for this encounter.    BP (!) 158/92   Pulse 73   Wt 100.5 kg (221 lb 9.6 oz)   LMP 03/14/2014 Comment: neg preg test  SpO2 98%   BMI 34.71 kg/m  General: NAD Neck: No JVD, no thyromegaly or thyroid nodule.  Lungs: Clear to auscultation bilaterally with normal respiratory effort. CV: Nondisplaced PMI.  Heart regular S1/S2, no S3/S4, no murmur.  No peripheral edema.  No carotid bruit.  Normal pedal pulses.  Abdomen: Soft, nontender, no hepatosplenomegaly, no distention.  Skin: Intact without lesions or rashes.  Neurologic: Alert and oriented x 3.  Psych: Normal affect. Extremities: No clubbing or cyanosis.  HEENT: Normal.   Assessment/Plan: 1.  Chronic systolic CHF: Echo 3/41 with EF 25-30%, nonischemic cardiomyopathy. Cardiomyopathy known since 43. Possibly peri-partum. SPEP negative.  Has Medtronic ICD. LHC (1/18) with no coronary disease.  She was admitted in 1/18 with extensive diuresis, required milrinone due to low output but was able to titrate off.  When RHC was done, cardiac output was ok off milrinone. Most recent echo 8/19 with EF up to 40-45%.  NYHA class II symptoms now, she is not volume overloaded on exam and weight has been coming down.   - She can continue to use torsemide prn.   - Continue Toprol XL 100 mg bid.    - She needs to restart spironolactone 12.5 mg daily.  BMET today and in 10 days.    - She is only taking Entresto once a day, needs to increase to bid.  2. Atrial flutter/fibrillation:  Noted in 12/17.  TEE in 1/18 showed LA appendage thrombus. She is now on Eliquis. Suspect atrial fibrillation with RVR drove her last CHF decompensation.  She remains in NSR today, now off amiodarone. Weight loss likely will help her maintain NSR.  - If atrial fibrillation recurs off amiodarone, would consider atrial fibrillation ablation.  - Continue Toprol XL 100 bid.   - Continue Eliquis  3. CKD: Stage III.  She has an atrophic left kidney.  BMET today.  4. H/o VT: Has Medtronic ICD. 5. OSA: Uses CPAP.   6. Iatrogenic hyperthyroidism: Low TSH recently, decrease Levoxyl to 100 mcg daily. TSH 1 month.   See HF pharmacist in 1 month for med titration, see me in 3 months.   Loralie Champagne 02/14/2018

## 2018-02-17 ENCOUNTER — Telehealth: Payer: Self-pay

## 2018-02-17 ENCOUNTER — Telehealth (HOSPITAL_COMMUNITY): Payer: Self-pay | Admitting: Surgery

## 2018-02-17 DIAGNOSIS — I428 Other cardiomyopathies: Secondary | ICD-10-CM

## 2018-02-17 DIAGNOSIS — I5023 Acute on chronic systolic (congestive) heart failure: Secondary | ICD-10-CM

## 2018-02-17 DIAGNOSIS — I472 Ventricular tachycardia, unspecified: Secondary | ICD-10-CM

## 2018-02-17 DIAGNOSIS — I5022 Chronic systolic (congestive) heart failure: Secondary | ICD-10-CM

## 2018-02-17 NOTE — Telephone Encounter (Signed)
Call placed to Pt.  Advised per Dr. Lovena Le- He prefers to try to place new lead without extracting old lead.    If during procedure vein is closed, would then look at scheduling lead extraction at that time.  Per Dr. Lovena Le if Pt would like to discuss before procedure that's ok.  Or if Pt would like to go ahead with procedure that's also ok.  Pt had few questions--wondered about if in 20 years newly placed lead would fail?  Advised per Dr. Rayann Heman would have to weigh risk vs. Benefits at that that.   Also, would lead being left behind break apart and cause problems?  Per Dr. Rayann Heman- no.  Pt indicates understanding.  Pt would like to go ahead at this time and schedule for gen change with lead placement.  Pt to come for lab work, instruction and soap on 02/22/2018.  Will process.

## 2018-02-17 NOTE — Telephone Encounter (Signed)
I called patient and made her aware of duplicate lab appts.  I have cancelled appt with [facility] as she will have BMET done pre-procedure on the same day.

## 2018-02-22 ENCOUNTER — Other Ambulatory Visit: Payer: Medicare Other | Admitting: *Deleted

## 2018-02-22 ENCOUNTER — Other Ambulatory Visit (HOSPITAL_COMMUNITY): Payer: Medicare Other

## 2018-02-22 DIAGNOSIS — I472 Ventricular tachycardia, unspecified: Secondary | ICD-10-CM

## 2018-02-22 DIAGNOSIS — I5022 Chronic systolic (congestive) heart failure: Secondary | ICD-10-CM | POA: Diagnosis not present

## 2018-02-22 DIAGNOSIS — I428 Other cardiomyopathies: Secondary | ICD-10-CM | POA: Diagnosis not present

## 2018-02-23 LAB — BASIC METABOLIC PANEL
BUN/Creatinine Ratio: 11 (ref 9–23)
BUN: 22 mg/dL (ref 6–24)
CO2: 17 mmol/L — ABNORMAL LOW (ref 20–29)
CREATININE: 2.04 mg/dL — AB (ref 0.57–1.00)
Calcium: 9.1 mg/dL (ref 8.7–10.2)
Chloride: 107 mmol/L — ABNORMAL HIGH (ref 96–106)
GFR, EST AFRICAN AMERICAN: 31 mL/min/{1.73_m2} — AB (ref >59)
GFR, EST NON AFRICAN AMERICAN: 27 mL/min/{1.73_m2} — AB (ref >59)
Glucose: 99 mg/dL (ref 65–99)
POTASSIUM: 3.7 mmol/L (ref 3.5–5.2)
SODIUM: 142 mmol/L (ref 134–144)

## 2018-02-23 LAB — CBC WITH DIFFERENTIAL/PLATELET
BASOS: 2 %
Basophils Absolute: 0.1 10*3/uL (ref 0.0–0.2)
EOS (ABSOLUTE): 0.2 10*3/uL (ref 0.0–0.4)
EOS: 5 %
HEMATOCRIT: 36.1 % (ref 34.0–46.6)
Hemoglobin: 12.4 g/dL (ref 11.1–15.9)
IMMATURE GRANS (ABS): 0 10*3/uL (ref 0.0–0.1)
IMMATURE GRANULOCYTES: 0 %
LYMPHS: 28 %
Lymphocytes Absolute: 1.1 10*3/uL (ref 0.7–3.1)
MCH: 27.2 pg (ref 26.6–33.0)
MCHC: 34.3 g/dL (ref 31.5–35.7)
MCV: 79 fL (ref 79–97)
MONOS ABS: 0.4 10*3/uL (ref 0.1–0.9)
Monocytes: 10 %
NEUTROS ABS: 2.2 10*3/uL (ref 1.4–7.0)
NEUTROS PCT: 55 %
PLATELETS: 185 10*3/uL (ref 150–450)
RBC: 4.56 x10E6/uL (ref 3.77–5.28)
RDW: 13.7 % (ref 12.3–15.4)
WBC: 4 10*3/uL (ref 3.4–10.8)

## 2018-03-02 ENCOUNTER — Other Ambulatory Visit: Payer: Self-pay

## 2018-03-02 ENCOUNTER — Encounter (HOSPITAL_COMMUNITY): Payer: Self-pay | Admitting: General Practice

## 2018-03-02 ENCOUNTER — Ambulatory Visit (HOSPITAL_COMMUNITY)
Admission: RE | Admit: 2018-03-02 | Discharge: 2018-03-03 | Disposition: A | Discharge status: Home / Self Care | Payer: Medicare Other | Source: Ambulatory Visit | Attending: Internal Medicine | Admitting: Internal Medicine

## 2018-03-02 ENCOUNTER — Ambulatory Visit (HOSPITAL_COMMUNITY)
Admission: RE | Disposition: A | Discharge status: Home / Self Care | Payer: Self-pay | Source: Ambulatory Visit | Attending: Internal Medicine

## 2018-03-02 DIAGNOSIS — G4733 Obstructive sleep apnea (adult) (pediatric): Secondary | ICD-10-CM | POA: Insufficient documentation

## 2018-03-02 DIAGNOSIS — I428 Other cardiomyopathies: Secondary | ICD-10-CM | POA: Diagnosis not present

## 2018-03-02 DIAGNOSIS — Z888 Allergy status to other drugs, medicaments and biological substances status: Secondary | ICD-10-CM | POA: Diagnosis not present

## 2018-03-02 DIAGNOSIS — Z8249 Family history of ischemic heart disease and other diseases of the circulatory system: Secondary | ICD-10-CM | POA: Insufficient documentation

## 2018-03-02 DIAGNOSIS — Z955 Presence of coronary angioplasty implant and graft: Secondary | ICD-10-CM | POA: Diagnosis not present

## 2018-03-02 DIAGNOSIS — E669 Obesity, unspecified: Secondary | ICD-10-CM | POA: Insufficient documentation

## 2018-03-02 DIAGNOSIS — Z79890 Hormone replacement therapy (postmenopausal): Secondary | ICD-10-CM | POA: Insufficient documentation

## 2018-03-02 DIAGNOSIS — Z6834 Body mass index (BMI) 34.0-34.9, adult: Secondary | ICD-10-CM | POA: Diagnosis not present

## 2018-03-02 DIAGNOSIS — Z885 Allergy status to narcotic agent status: Secondary | ICD-10-CM | POA: Diagnosis not present

## 2018-03-02 DIAGNOSIS — I472 Ventricular tachycardia, unspecified: Secondary | ICD-10-CM

## 2018-03-02 DIAGNOSIS — I429 Cardiomyopathy, unspecified: Secondary | ICD-10-CM

## 2018-03-02 DIAGNOSIS — Z4502 Encounter for adjustment and management of automatic implantable cardiac defibrillator: Secondary | ICD-10-CM | POA: Diagnosis not present

## 2018-03-02 DIAGNOSIS — I4819 Other persistent atrial fibrillation: Secondary | ICD-10-CM | POA: Insufficient documentation

## 2018-03-02 DIAGNOSIS — I255 Ischemic cardiomyopathy: Secondary | ICD-10-CM | POA: Insufficient documentation

## 2018-03-02 DIAGNOSIS — Z006 Encounter for examination for normal comparison and control in clinical research program: Secondary | ICD-10-CM | POA: Insufficient documentation

## 2018-03-02 DIAGNOSIS — I110 Hypertensive heart disease with heart failure: Secondary | ICD-10-CM | POA: Insufficient documentation

## 2018-03-02 DIAGNOSIS — E119 Type 2 diabetes mellitus without complications: Secondary | ICD-10-CM | POA: Diagnosis not present

## 2018-03-02 DIAGNOSIS — Z79899 Other long term (current) drug therapy: Secondary | ICD-10-CM | POA: Insufficient documentation

## 2018-03-02 DIAGNOSIS — I5022 Chronic systolic (congestive) heart failure: Secondary | ICD-10-CM | POA: Diagnosis not present

## 2018-03-02 DIAGNOSIS — Z7901 Long term (current) use of anticoagulants: Secondary | ICD-10-CM | POA: Diagnosis not present

## 2018-03-02 HISTORY — PX: ICD GENERATOR CHANGEOUT: EP1231

## 2018-03-02 HISTORY — PX: LEAD REVISION/REPAIR: EP1213

## 2018-03-02 LAB — SURGICAL PCR SCREEN
MRSA, PCR: NEGATIVE
Staphylococcus aureus: NEGATIVE

## 2018-03-02 LAB — GLUCOSE, CAPILLARY: Glucose-Capillary: 84 mg/dL (ref 70–99)

## 2018-03-02 SURGERY — ICD GENERATOR CHANGEOUT

## 2018-03-02 MED ORDER — MUPIROCIN 2 % EX OINT
TOPICAL_OINTMENT | CUTANEOUS | Status: AC
  Administered 2018-03-02: 1
  Filled 2018-03-02: qty 22

## 2018-03-02 MED ORDER — FENTANYL CITRATE (PF) 100 MCG/2ML IJ SOLN
INTRAMUSCULAR | Status: AC
  Filled 2018-03-02: qty 2

## 2018-03-02 MED ORDER — IOPAMIDOL (ISOVUE-370) INJECTION 76%
INTRAVENOUS | Status: AC
  Filled 2018-03-02: qty 50

## 2018-03-02 MED ORDER — CEFAZOLIN SODIUM-DEXTROSE 2-4 GM/100ML-% IV SOLN
2.0000 g | INTRAVENOUS | Status: AC
  Administered 2018-03-02: 2 g via INTRAVENOUS

## 2018-03-02 MED ORDER — SODIUM CHLORIDE 0.9 % IV SOLN
INTRAVENOUS | Status: AC | PRN
  Administered 2018-03-02: 250 mL via INTRAVENOUS

## 2018-03-02 MED ORDER — TORSEMIDE 20 MG PO TABS: 20.0000 mg | ORAL_TABLET | Freq: Every day | ORAL | Status: DC | PRN

## 2018-03-02 MED ORDER — VITAMIN B-12 1000 MCG PO TABS
1000.0000 ug | ORAL_TABLET | Freq: Every day | ORAL | Status: DC
  Administered 2018-03-02 – 2018-03-03 (×2): 1000 ug via ORAL
  Filled 2018-03-02 (×2): qty 1

## 2018-03-02 MED ORDER — FENTANYL CITRATE (PF) 100 MCG/2ML IJ SOLN
INTRAMUSCULAR | Status: DC | PRN
  Administered 2018-03-02: 25 ug via INTRAVENOUS
  Administered 2018-03-02 (×5): 12.5 ug via INTRAVENOUS
  Administered 2018-03-02: 25 ug via INTRAVENOUS
  Administered 2018-03-02 (×2): 12.5 ug via INTRAVENOUS

## 2018-03-02 MED ORDER — LEVOTHYROXINE SODIUM 50 MCG PO TABS
50.0000 ug | ORAL_TABLET | Freq: Every day | ORAL | Status: DC
  Administered 2018-03-03: 50 ug via ORAL
  Filled 2018-03-02: qty 1

## 2018-03-02 MED ORDER — METOPROLOL SUCCINATE ER 100 MG PO TB24
100.0000 mg | ORAL_TABLET | Freq: Every day | ORAL | Status: DC
  Administered 2018-03-03: 100 mg via ORAL
  Filled 2018-03-02 (×2): qty 1

## 2018-03-02 MED ORDER — MIDAZOLAM HCL 5 MG/5ML IJ SOLN
INTRAMUSCULAR | Status: AC
  Filled 2018-03-02: qty 5

## 2018-03-02 MED ORDER — CHLORHEXIDINE GLUCONATE 4 % EX LIQD
60.0000 mL | Freq: Once | CUTANEOUS | Status: DC
  Filled 2018-03-02: qty 60

## 2018-03-02 MED ORDER — FERROUS SULFATE 325 (65 FE) MG PO TABS
325.0000 mg | ORAL_TABLET | Freq: Every day | ORAL | Status: DC
  Administered 2018-03-03: 325 mg via ORAL
  Filled 2018-03-02: qty 1

## 2018-03-02 MED ORDER — ADULT MULTIVITAMIN W/MINERALS CH
1.0000 | ORAL_TABLET | Freq: Every day | ORAL | Status: DC
  Administered 2018-03-02 – 2018-03-03 (×2): 1 via ORAL
  Filled 2018-03-02 (×4): qty 1

## 2018-03-02 MED ORDER — LIDOCAINE HCL (PF) 1 % IJ SOLN
INTRAMUSCULAR | Status: AC
  Filled 2018-03-02: qty 30

## 2018-03-02 MED ORDER — ONDANSETRON HCL 4 MG/2ML IJ SOLN: 4.0000 mg | Freq: Four times a day (QID) | INTRAMUSCULAR | Status: DC | PRN

## 2018-03-02 MED ORDER — SODIUM CHLORIDE 0.9 % IV SOLN
INTRAVENOUS | Status: AC
  Filled 2018-03-02: qty 2

## 2018-03-02 MED ORDER — IOPAMIDOL (ISOVUE-370) INJECTION 76%
INTRAVENOUS | Status: DC | PRN
  Administered 2018-03-02: 10 mL via INTRAVENOUS

## 2018-03-02 MED ORDER — CITALOPRAM HYDROBROMIDE 10 MG PO TABS
10.0000 mg | ORAL_TABLET | Freq: Every day | ORAL | Status: DC
  Administered 2018-03-02 – 2018-03-03 (×2): 10 mg via ORAL
  Filled 2018-03-02 (×2): qty 1

## 2018-03-02 MED ORDER — CEFAZOLIN SODIUM-DEXTROSE 2-4 GM/100ML-% IV SOLN
INTRAVENOUS | Status: AC
  Filled 2018-03-02: qty 100

## 2018-03-02 MED ORDER — CALCIUM CARBONATE-VITAMIN D 500-200 MG-UNIT PO TABS
1.0000 | ORAL_TABLET | Freq: Every day | ORAL | Status: DC
  Administered 2018-03-02 – 2018-03-03 (×2): 1 via ORAL
  Filled 2018-03-02 (×4): qty 1

## 2018-03-02 MED ORDER — MIDAZOLAM HCL 5 MG/5ML IJ SOLN
INTRAMUSCULAR | Status: DC | PRN
  Administered 2018-03-02 (×2): 1 mg via INTRAVENOUS
  Administered 2018-03-02 (×4): 2 mg via INTRAVENOUS
  Administered 2018-03-02 (×2): 1 mg via INTRAVENOUS
  Administered 2018-03-02: 2 mg via INTRAVENOUS
  Administered 2018-03-02 (×2): 1 mg via INTRAVENOUS

## 2018-03-02 MED ORDER — HEPARIN (PORCINE) IN NACL 1000-0.9 UT/500ML-% IV SOLN
INTRAVENOUS | Status: AC
  Filled 2018-03-02: qty 500

## 2018-03-02 MED ORDER — SPIRONOLACTONE 12.5 MG HALF TABLET
12.5000 mg | ORAL_TABLET | Freq: Every day | ORAL | Status: DC
  Administered 2018-03-02 – 2018-03-03 (×2): 12.5 mg via ORAL
  Filled 2018-03-02 (×2): qty 1

## 2018-03-02 MED ORDER — SODIUM CHLORIDE 0.9 % IV SOLN
INTRAVENOUS | Status: DC
  Administered 2018-03-02: 09:00:00 via INTRAVENOUS

## 2018-03-02 MED ORDER — SACUBITRIL-VALSARTAN 24-26 MG PO TABS
1.0000 | ORAL_TABLET | Freq: Two times a day (BID) | ORAL | Status: DC
  Administered 2018-03-02 – 2018-03-03 (×2): 1 via ORAL
  Filled 2018-03-02 (×2): qty 1

## 2018-03-02 MED ORDER — SODIUM CHLORIDE 0.9 % IV SOLN
80.0000 mg | INTRAVENOUS | Status: AC
  Administered 2018-03-02: 160 mg

## 2018-03-02 MED ORDER — CEFAZOLIN SODIUM-DEXTROSE 1-4 GM/50ML-% IV SOLN
1.0000 g | Freq: Four times a day (QID) | INTRAVENOUS | Status: AC
  Administered 2018-03-02 – 2018-03-03 (×3): 1 g via INTRAVENOUS
  Filled 2018-03-02 (×3): qty 50

## 2018-03-02 MED ORDER — HEPARIN (PORCINE) IN NACL 1000-0.9 UT/500ML-% IV SOLN
INTRAVENOUS | Status: DC | PRN
  Administered 2018-03-02: 500 mL

## 2018-03-02 MED ORDER — LIDOCAINE HCL (PF) 1 % IJ SOLN
INTRAMUSCULAR | Status: DC | PRN
  Administered 2018-03-02: 70 mL

## 2018-03-02 MED ORDER — PROMETHAZINE HCL 25 MG PO TABS: 25.0000 mg | ORAL_TABLET | Freq: Three times a day (TID) | ORAL | Status: DC | PRN

## 2018-03-02 MED ORDER — PANTOPRAZOLE SODIUM 40 MG PO TBEC
40.0000 mg | DELAYED_RELEASE_TABLET | Freq: Every day | ORAL | Status: DC
  Administered 2018-03-02 – 2018-03-03 (×2): 40 mg via ORAL
  Filled 2018-03-02 (×2): qty 1

## 2018-03-02 MED ORDER — ACETAMINOPHEN 325 MG PO TABS
325.0000 mg | ORAL_TABLET | ORAL | Status: DC | PRN
  Administered 2018-03-02 – 2018-03-03 (×3): 650 mg via ORAL
  Filled 2018-03-02 (×3): qty 2

## 2018-03-02 SURGICAL SUPPLY — 9 items
CABLE SURGICAL S-101-97-12 (CABLE) ×3 IMPLANT
ICD VISIA MRI VR DVFB1D4 (ICD Generator) ×1 IMPLANT
KIT LEAD END CAP (Cap) ×3 IMPLANT
LEAD END CAP (Cap) ×6 IMPLANT
LEAD SPRINT QUAT SEC 6935M-62 (Lead) ×3 IMPLANT
PAD PRO RADIOLUCENT 2001M-C (PAD) ×3 IMPLANT
SHEATH CLASSIC 9F (SHEATH) ×3 IMPLANT
TRAY PACEMAKER INSERTION (PACKS) ×3 IMPLANT
VISIA MRI VR DVFB1D4 (ICD Generator) ×3 IMPLANT

## 2018-03-02 NOTE — Interval H&P Note (Signed)
History and Physical Interval Note:  03/02/2018 9:54 AM  Erin Jarvis  has presented today for surgery, with the diagnosis of eri - bad lead  The various methods of treatment have been discussed with the patient and family. After consideration of risks, benefits and other options for treatment, the patient has consented to  Procedure(s): ICD Dyer (N/A) LEAD REVISION/REPAIR (N/A) as a surgical intervention .  The patient's history has been reviewed, patient examined, no change in status, stable for surgery.  I have reviewed the patient's chart and labs.  Questions were answered to the patient's satisfaction.     Cristopher Peru

## 2018-03-02 NOTE — H&P (Signed)
HPI Erin Jarvis is referred today by Dr. Rayann Heman for evaluation of her ICD which has had evidence of lead malfunction. She is a pleasant 52 yo woman with peripartum CM, s/p ICD insertion about 18 years ago. She has had one gen change and is now ERI. She is referred as the patient was noted to have worsening RV ICD lead function. She also has known VT. Her CHF symptoms are class 2-3 and she is on maximal medication. She has had no syncope. She has a [phone] medtronic ICD lead. She has no chest Xray in the system.       Allergies  Allergen Reactions  . Morphine And Related Nausea Only and Other (See Comments)    Feels sick,and like she is going to pass out; can't breathe, starts sweating  . Atorvastatin     Nitemares           Current Outpatient Medications  Medication Sig Dispense Refill  . apixaban (ELIQUIS) 5 MG TABS tablet Take 1 tablet (5 mg total) by mouth 2 (two) times daily. 180 tablet 3  . Calcium Citrate-Vitamin D (CALCIUM CITRATE + D) 250-200 MG-UNIT TABS Take 1 tablet by mouth daily.    . citalopram (CELEXA) 10 MG tablet Take 10 mg by mouth daily.     . ferrous sulfate 325 (65 FE) MG tablet Take 325 mg by mouth daily with breakfast.    . levothyroxine (SYNTHROID, LEVOTHROID) 150 MCG tablet Take 150 mcg by mouth daily before breakfast.     . metoprolol succinate (TOPROL-XL) 100 MG 24 hr tablet TAKE ONE TABLET (100 MG) TWICE DAILY 60 tablet 6  . Multiple Vitamin (MULTIVITAMIN) tablet Take 1 tablet by mouth daily.    . pantoprazole (PROTONIX) 40 MG tablet Take 40 mg by mouth daily.    . promethazine (PHENERGAN) 25 MG tablet Take 25 mg by mouth every 8 (eight) hours as needed for nausea or vomiting.    . sacubitril-valsartan (ENTRESTO) 24-26 MG Take 1 tablet by mouth 2 (two) times daily. 60 tablet 11  . torsemide (DEMADEX) 20 MG tablet Take 1 tablet (20 mg total) by mouth daily as needed (For swelling, weight gain). 30 tablet 11  . vitamin B-12  (CYANOCOBALAMIN) 1000 MCG tablet Take 1,000 mcg by mouth daily.     No current facility-administered medications for this visit.      Past Medical History:  Diagnosis Date  . AICD (automatic cardioverter/defibrillator) present 2002, 2010   secondary to post-partum cardiomyopathy (1996)  . CHF (congestive heart failure) ([place])   . Diabetes mellitus without complication ([place])   . Hypertension   . Hypertensive cardiovascular disease   . Obesity   . Persistent atrial fibrillation   . Postpartum cardiomyopathy    Last echocardiogram was last year and EF 45 -50 %  . Seasonal allergies   . Sleep apnea    complaint with CPAP  . Ventricular tachycardia (H[facility])    s/p ICD Implantation (MDT Secura VR)    ROS:   All systems reviewed and negative except as noted in the HPI.        Past Surgical History:  Procedure Laterality Date  . CARDIAC CATHETERIZATION N/A 04/11/2016   Procedure: Right/Left Heart Cath and Coronary Angiography;  Surgeon: Larey Dresser, MD;  Location: [place] [facility];  Service: Cardiovascular;  Laterality: N/A;  . CARDIAC DEFIBRILLATOR PLACEMENT  08/12/2000, 11/11/2008   Most recent Generator MDT Secura VR placed in [place]  .  CESAREAN SECTION  1996  . CHOLECYSTECTOMY  2004  . IR GENERIC HISTORICAL  06/13/2014   IR RADIOLOGIST EVAL & MGMT 06/13/2014 Greggory Keen, MD [facility] INTER[facility] RAD  . IR GENERIC HISTORICAL  04/09/2016   IR FLUORO GUIDE CV LINE LEFT 04/09/2016 Greggory Keen, MD [facility]  . IR GENERIC HISTORICAL  04/09/2016   IR US GUIDE VASC ACCESS RIGHT 04/09/2016 Greggory Keen, MD [facility]  . IR GENERIC HISTORICAL  04/09/2016   IR VENO/EXT/UNI RIGHT 04/09/2016 Greggory Keen, MD [facility]  . IR GENERIC HISTORICAL  04/09/2016   IR US GUIDE VASC ACCESS LEFT 04/09/2016 Greggory Keen, MD [facility]  . TEE WITHOUT CARDIOVERSION N/A 04/08/2016   Procedure: TRANSESOPHAGEAL ECHOCARDIOGRAM (TEE);  Surgeon: Pixie Casino, MD;  Location: [facility] ENDOSCOPY;  Service: Cardiovascular;  Laterality: N/A;          Family History  Problem Relation Age of Onset  . Other Mother        has chronic pain syndrome from DDD with spinal cord stimulator  . Hypertension Mother   . COPD Father   . Arthritis/Rheumatoid Father      Social History        Socioeconomic History  . Marital status: Divorced    Spouse name: Not on file  . Number of children: Not on file  . Years of education: Not on file  . Highest education level: Not on file  Occupational History  . Not on file  Social Needs  . Financial resource strain: Not on file  . Food insecurity:    Worry: Not on file    Inability: Not on file  . Transportation needs:    Medical: Not on file    Non-medical: Not on file  Tobacco Use  . Smoking status: Never Smoker  . Smokeless tobacco: Never Used  Substance and Sexual Activity  . Alcohol use: No    Alcohol/week: 0.0 standard drinks  . Drug use: No  . Sexual activity: Not on file  Lifestyle  . Physical activity:    Days per week: Not on file    Minutes per session: Not on file  . Stress: Not on file  Relationships  . Social connections:    Talks on phone: Not on file    Gets together: Not on file    Attends religious service: Not on file    Active member of club or organization: Not on file    Attends meetings of clubs or organizations: Not on file    Relationship status: Not on file  . Intimate partner violence:    Fear of current or ex partner: Not on file    Emotionally abused: Not on file    Physically abused: Not on file    Forced sexual activity: Not on file  Other Topics Concern  . Not on file  Social History Narrative   Lives with her parents and 43 year old daughter in [place].     BP 108/68   Pulse (!) 59   Ht 5\' 7"  (1.702 m)   Wt 217 lb 6.4 oz (98.6 kg)   LMP 03/14/2014 Comment: neg preg test  SpO2 97%   BMI 34.05  kg/m   Physical Exam:  Well appearing but obese middle aged woman, NAD HEENT: Unremarkable Neck:  6 cm JVD, no thyromegally Lymphatics:  No adenopathy Back:  No CVA tenderness Lungs:  Clear with no wheezes HEART:  Regular rate rhythm, no murmurs, no rubs, no clicks  Abd:  soft, positive bowel sounds, no organomegally, no rebound, no guarding Ext:  2 plus pulses, no edema, no cyanosis, no clubbing Skin:  No rashes no nodules Neuro:  CN II through XII intact, motor grossly intact  EKG - sinus bradycardia  DEVICE  Normal device function.  See PaceArt for details. Reviewed.  Assess/Plan: 1. ICD - her device is at [facility]. I have recommneded she undergo CXR to look at her lead. Based on her comorbidities and age, and severity of her CHF symptoms, I think the best approach would be to try and place another ICD lead. Her comorbidities would sugest that is the best approach. If she has an occluded vein on venography, then extraction would be suggested. 2. VT - she has been stable and will continue her current medical therapy.  3. Chronic systolic heart failure - she appears euvolemic on torsemide. I spent over 25 minutes including 50% face to face time with this patient.  Ponciano Ort.  EP Attending  Patient seen and examined. Agree with above. Since I last saw the patient, no change in the plan. She will undergo insertion of a new ICD lead, with capping of her old lead if the SCV is patent.  Mikle Bosworth.D

## 2018-03-03 ENCOUNTER — Ambulatory Visit (HOSPITAL_COMMUNITY): Payer: Medicare Other

## 2018-03-03 DIAGNOSIS — G4733 Obstructive sleep apnea (adult) (pediatric): Secondary | ICD-10-CM | POA: Diagnosis not present

## 2018-03-03 DIAGNOSIS — R0789 Other chest pain: Secondary | ICD-10-CM | POA: Diagnosis not present

## 2018-03-03 DIAGNOSIS — I255 Ischemic cardiomyopathy: Secondary | ICD-10-CM | POA: Diagnosis not present

## 2018-03-03 DIAGNOSIS — I5022 Chronic systolic (congestive) heart failure: Secondary | ICD-10-CM | POA: Diagnosis not present

## 2018-03-03 DIAGNOSIS — Z4502 Encounter for adjustment and management of automatic implantable cardiac defibrillator: Secondary | ICD-10-CM | POA: Diagnosis not present

## 2018-03-03 DIAGNOSIS — Z7901 Long term (current) use of anticoagulants: Secondary | ICD-10-CM | POA: Diagnosis not present

## 2018-03-03 DIAGNOSIS — E669 Obesity, unspecified: Secondary | ICD-10-CM | POA: Diagnosis not present

## 2018-03-03 DIAGNOSIS — Z006 Encounter for examination for normal comparison and control in clinical research program: Secondary | ICD-10-CM | POA: Diagnosis not present

## 2018-03-03 DIAGNOSIS — I472 Ventricular tachycardia: Secondary | ICD-10-CM | POA: Diagnosis not present

## 2018-03-03 DIAGNOSIS — I428 Other cardiomyopathies: Secondary | ICD-10-CM | POA: Diagnosis not present

## 2018-03-03 DIAGNOSIS — I4819 Other persistent atrial fibrillation: Secondary | ICD-10-CM | POA: Diagnosis not present

## 2018-03-03 DIAGNOSIS — I110 Hypertensive heart disease with heart failure: Secondary | ICD-10-CM | POA: Diagnosis not present

## 2018-03-03 DIAGNOSIS — E119 Type 2 diabetes mellitus without complications: Secondary | ICD-10-CM | POA: Diagnosis not present

## 2018-03-03 NOTE — Discharge Summary (Addendum)
ELECTROPHYSIOLOGY PROCEDURE DISCHARGE SUMMARY    Patient ID: Erin Jarvis,  MRN: [id number], DOB/AGE: 1965/12/26 52 y.o.  Admit date: 03/02/2018 Discharge date: 03/03/2018  Primary Care Physician: [name], MD Primary Cardiologist: Hochrein Heart Failure: Aundra Dubin Electrophysiologist: Allred  Primary Discharge Diagnosis:  1.  RV lead malfunction status post RV lead revision this admission  Secondary Discharge Diagnosis:  1.  VT 2.  Obesity 3.  OSA 4.  Persistent atrial fibrillation 5.  Chronic systolic heart failure  Allergies  Allergen Reactions  . Morphine And Related Nausea Only and Other (See Comments)    Feels sick,and like she is going to pass out; can't breathe, starts sweating  . Atorvastatin     Nitemares     Procedures This Admission:  1.  RV lead revision and ICD gen change on 03/02/18 by Dr Lovena Le. See op note for full details. There were no immediate post procedure complications. 2.  CXR on 03/03/18 demonstrated no pneumothorax status post device implantation.   Brief HPI: Erin Jarvis is a 52 y.o. female with the above past medical history.  Her previously implanted RV lead has started to demonstrate decreased sensing and impedance changes.  Risks, benefits, and alternatives to RV lead revision were reviewed with the patient who wished to proceed.   Hospital Course:  The patient was admitted and underwent implantation of a new RV lead with details as outlined above. She was monitored on telemetry overnight which demonstrated SR with PVC's.  Left chest was without hematoma or ecchymosis.  The device was interrogated and found to be functioning normally.  CXR was obtained and demonstrated no pneumothorax status post device implantation.  Wound care, arm mobility, and restrictions were reviewed with the patient.  The patient was examined and considered stable for discharge to home.   The patient's discharge medications include an ARB (Entresto)  and beta blocker (Metoprolol).   Physical Exam: Vitals:   03/02/18 1415 03/02/18 1430 03/02/18 1946 03/03/18 0456  BP: (!) 103/52 (!) 117/40 (!) 93/49 120/66  Pulse: 65 (!) 53 65 63  Resp: 16 16 20 16   Temp:   98.7 F (37.1 C) 98.1 F (36.7 C)  TempSrc:   Oral Oral  SpO2: 100% 100% 99% 99%  Weight:    99.1 kg  Height:        GEN- The patient is well appearing, alert and oriented x 3 today.   HEENT: normocephalic, atraumatic; sclera clear, conjunctiva pink; hearing intact; oropharynx clear; neck supple  Lungs- Clear to ausculation bilaterally, normal work of breathing.  No wheezes, rales, rhonchi Heart- Regular rate and rhythm  GI- soft, non-tender, non-distended, bowel sounds present  Extremities- no clubbing, cyanosis, or edema  MS- no significant deformity or atrophy Skin- warm and dry, no rash or lesion, left chest without hematoma/ecchymosis Psych- euthymic mood, full affect Neuro- strength and sensation are intact   Labs:   Lab Results  Component Value Date   WBC 4.0 02/22/2018   HGB 12.4 02/22/2018   HCT 36.1 02/22/2018   MCV 79 02/22/2018   PLT 185 02/22/2018   No results for input(s): NA, K, CL, CO2, BUN, CREATININE, CALCIUM, PROT, BILITOT, ALKPHOS, ALT, AST, GLUCOSE in the last 168 hours.  Invalid input(s): LABALBU  Discharge Medications:  Allergies as of 03/03/2018      Reactions   Morphine And Related Nausea Only, Other (See Comments)   Feels sick,and like she is going to pass out; can't breathe, starts sweating  Atorvastatin    Nitemares      Medication List    TAKE these medications   apixaban 5 MG Tabs tablet Commonly known as:  ELIQUIS Take 1 tablet (5 mg total) by mouth 2 (two) times daily.   CALCIUM CITRATE + D 250-200 MG-UNIT Tabs Generic drug:  Calcium Citrate-Vitamin D Take 1 tablet by mouth daily.   citalopram 10 MG tablet Commonly known as:  CELEXA Take 10 mg by mouth daily.   ferrous sulfate 325 (65 FE) MG tablet Take 325 mg by  mouth daily with breakfast.   levothyroxine 50 MCG tablet Commonly known as:  SYNTHROID, LEVOTHROID Take 50 mcg by mouth daily before breakfast.   metoprolol succinate 100 MG 24 hr tablet Commonly known as:  TOPROL-XL TAKE ONE TABLET (100 MG) TWICE DAILY What changed:  See the new instructions.   multivitamin tablet Take 1 tablet by mouth daily.   pantoprazole 40 MG tablet Commonly known as:  PROTONIX Take 40 mg by mouth daily.   promethazine 25 MG tablet Commonly known as:  PHENERGAN Take 25 mg by mouth every 8 (eight) hours as needed for nausea or vomiting.   sacubitril-valsartan 24-26 MG Commonly known as:  ENTRESTO Take 1 tablet by mouth 2 (two) times daily.   spironolactone 25 MG tablet Commonly known as:  ALDACTONE Take 0.5 tablets (12.5 mg total) by mouth daily.   torsemide 20 MG tablet Commonly known as:  DEMADEX Take 1 tablet (20 mg total) by mouth daily as needed (For swelling, weight gain).   vitamin B-12 1000 MCG tablet Commonly known as:  CYANOCOBALAMIN Take 1,000 mcg by mouth daily.       Disposition:  Discharge Instructions    Diet - low sodium heart healthy   Complete by:  As directed    Increase activity slowly   Complete by:  As directed      Follow-up Information    [place] [facility] Follow up on 03/15/2018.   Specialty:  Cardiology Why:  at 9:30AM Contact information: [address], [address] [place]       Evans Lance, MD Follow up on 06/01/2018.   Specialty:  Cardiology Why:  at 9:45AM Contact information: [address] [address] [phone]           Duration of Discharge Encounter: Greater than 30 minutes including physician time.  Signed, Chanetta Marshall, NP 03/03/2018 7:17 AM  EP Attending  Patient seen and examined. Agree with above. The patient is doing well s/p insertion of a new ICD lead. Her device interogated under my  direction demonstrates normal function. CXR looks good. She will be discharged home with usual followup.  Mikle Bosworth.D.

## 2018-03-03 NOTE — Discharge Instructions (Signed)
° ° °  Supplemental Discharge Instructions for  Pacemaker/Defibrillator Patients  Activity No heavy lifting or vigorous activity with your left/right arm for 6 to 8 weeks.  Do not raise your left/right arm above your head for one week.  Gradually raise your affected arm as drawn below.           __   03/07/18                      03/08/18                    03/09/18                    03/10/18  NO DRIVING for   1 week   ; you may begin driving on   65/46/50  .  WOUND CARE - Keep the wound area clean and dry.  Do not get this area wet for one week. No showers for one week; you may shower on   03/10/18  . - The tape/steri-strips on your wound will fall off; do not pull them off.  No bandage is needed on the site.  DO  NOT apply any creams, oils, or ointments to the wound area. - If you notice any drainage or discharge from the wound, any swelling or bruising at the site, or you develop a fever > 101? F after you are discharged home, call the office at once.  Special Instructions - You are still able to use cellular telephones; use the ear opposite the side where you have your pacemaker/defibrillator.  Avoid carrying your cellular phone near your device. - When traveling through airports, show security personnel your identification card to avoid being screened in the metal detectors.  Ask the security personnel to use the hand wand. - Avoid arc welding equipment, MRI testing (magnetic resonance imaging), TENS units (transcutaneous nerve stimulators).  Call the office for questions about other devices. - Avoid electrical appliances that are in poor condition or are not properly grounded. - Microwave ovens are safe to be near or to operate.  Additional information for defibrillator patients should your device go off: - If your device goes off ONCE and you feel fine afterward, notify the device clinic nurses. - If your device goes off ONCE and you do not feel well afterward, call 911. - If your  device goes off TWICE, call 911. - If your device goes off THREE times in one day, call 911.  DO NOT DRIVE YOURSELF OR A FAMILY MEMBER WITH A DEFIBRILLATOR TO THE HOSPITAL--CALL 911.

## 2018-03-04 MED FILL — Gentamicin Sulfate Inj 40 MG/ML: INTRAMUSCULAR | Qty: 80 | Status: AC

## 2018-03-05 ENCOUNTER — Encounter (HOSPITAL_COMMUNITY): Payer: Self-pay | Admitting: Internal Medicine

## 2018-03-09 ENCOUNTER — Telehealth: Payer: Self-pay | Admitting: Cardiology

## 2018-03-09 ENCOUNTER — Ambulatory Visit (INDEPENDENT_AMBULATORY_CARE_PROVIDER_SITE_OTHER): Payer: Medicare Other

## 2018-03-09 DIAGNOSIS — Z95810 Presence of automatic (implantable) cardiac defibrillator: Secondary | ICD-10-CM

## 2018-03-09 DIAGNOSIS — I5022 Chronic systolic (congestive) heart failure: Secondary | ICD-10-CM

## 2018-03-09 NOTE — Telephone Encounter (Signed)
LMOVM reminding pt to send remote transmission.   

## 2018-03-11 NOTE — Progress Notes (Signed)
EPIC Encounter for ICM Monitoring  Patient Name: IZZA BICKLE is a 52 y.o. female Date: 03/11/2018 Primary Care Physican: [name], MD Primary Cardiologist:Hochrein/McLean Electrophysiologist: Allred Nephrologist: Dr Hollie Salk at [facility] Last Weight:  232lbs Today's Weight:  unknown     Transmission reviewed.  Device change 03/02/2018 and Optivol still developing fluid index, daily and reference thoracic impedance.    Thoracic impedance thought to be inaccurate and being recalibrated 02/12/2018  Prescribed: Torsemide20 mgTake 1 tablet (20 mg total) by mouth daily as needed (For swelling, weight gain).  Labs: 10/14/2017 Creatinine 1.66, BUM 18, Potassium 4.0, Sodium 142, EGFR 34-39 09/23/2017 Creatinine 2.20, BUN 28, Potassium 4.3, Sodium 139, EGFR 24-28 09/18/2017 Creatinine 2.64, BUN 51, Potassium 3.8, Sodium 141, EGFR 20-23 09/01/2017 Creatinine 3.43, BUN 65, Potassium 3.5, Sodium 142, EGFR 14-17 07/22/2017 Creatinine 2.08, BUN 40, Potassium 3.3, Sodium 138, EGFR 26-30 07/10/2017 Creatinine 1.50, BUN 27, Potassium 4.2, Sodium 140, EGFR 39-45 (Care Everywhere) 07/07/2017 Creatinine 2.15, BUN 33, Potassium 3.9, Sodium 138, EGFR 25-29 06/11/2017 Creatinine 2.06, BUN 53, Potassium 4.3, Sodium 135, EGFR 27-31  Recommendations:  None  Follow-up plan: ICM clinic phone appointment on 05/06/2018    Copy of ICM check sent to Dr. Rayann Heman.   3 month ICM trend: 03/10/2018    1 Year ICM trend:       [name], RN 03/11/2018 3:44 PM

## 2018-03-15 ENCOUNTER — Ambulatory Visit (INDEPENDENT_AMBULATORY_CARE_PROVIDER_SITE_OTHER): Payer: Medicare Other | Admitting: *Deleted

## 2018-03-15 ENCOUNTER — Ambulatory Visit (HOSPITAL_COMMUNITY)
Admission: RE | Admit: 2018-03-15 | Discharge: 2018-03-15 | Disposition: A | Discharge status: Home / Self Care | Payer: Medicare Other | Source: Ambulatory Visit | Attending: Internal Medicine | Admitting: Internal Medicine

## 2018-03-15 VITALS — BP 126/86 | HR 62 | Wt 214.4 lb

## 2018-03-15 DIAGNOSIS — I4891 Unspecified atrial fibrillation: Secondary | ICD-10-CM | POA: Diagnosis not present

## 2018-03-15 DIAGNOSIS — E039 Hypothyroidism, unspecified: Secondary | ICD-10-CM

## 2018-03-15 DIAGNOSIS — Z95810 Presence of automatic (implantable) cardiac defibrillator: Secondary | ICD-10-CM | POA: Diagnosis not present

## 2018-03-15 DIAGNOSIS — I5022 Chronic systolic (congestive) heart failure: Secondary | ICD-10-CM | POA: Insufficient documentation

## 2018-03-15 DIAGNOSIS — I5032 Chronic diastolic (congestive) heart failure: Secondary | ICD-10-CM

## 2018-03-15 DIAGNOSIS — I420 Dilated cardiomyopathy: Secondary | ICD-10-CM | POA: Diagnosis not present

## 2018-03-15 DIAGNOSIS — E0580 Other thyrotoxicosis without thyrotoxic crisis or storm: Secondary | ICD-10-CM | POA: Diagnosis not present

## 2018-03-15 DIAGNOSIS — N183 Chronic kidney disease, stage 3 (moderate): Secondary | ICD-10-CM | POA: Insufficient documentation

## 2018-03-15 DIAGNOSIS — N261 Atrophy of kidney (terminal): Secondary | ICD-10-CM | POA: Diagnosis not present

## 2018-03-15 DIAGNOSIS — Z9884 Bariatric surgery status: Secondary | ICD-10-CM | POA: Diagnosis not present

## 2018-03-15 DIAGNOSIS — I4892 Unspecified atrial flutter: Secondary | ICD-10-CM | POA: Insufficient documentation

## 2018-03-15 DIAGNOSIS — G4733 Obstructive sleep apnea (adult) (pediatric): Secondary | ICD-10-CM | POA: Diagnosis not present

## 2018-03-15 DIAGNOSIS — I429 Cardiomyopathy, unspecified: Secondary | ICD-10-CM

## 2018-03-15 DIAGNOSIS — I5043 Acute on chronic combined systolic (congestive) and diastolic (congestive) heart failure: Secondary | ICD-10-CM

## 2018-03-15 LAB — CUP PACEART INCLINIC DEVICE CHECK
Battery Remaining Longevity: 135 mo
Battery Voltage: 3.08 V
Brady Statistic RV Percent Paced: 0.12 %
Date Time Interrogation Session: 20191216101845
HighPow Impedance: 48 Ohm
Implantable Lead Location: 753860
Implantable Pulse Generator Implant Date: 20191203
Lead Channel Impedance Value: 342 Ohm
Lead Channel Pacing Threshold Pulse Width: 0.4 ms
Lead Channel Sensing Intrinsic Amplitude: 9 mV
Lead Channel Setting Pacing Amplitude: 3.5 V
Lead Channel Setting Pacing Pulse Width: 0.4 ms
Lead Channel Setting Sensing Sensitivity: 0.3 mV
MDC IDC LEAD IMPLANT DT: 20191203
MDC IDC MSMT LEADCHNL RV IMPEDANCE VALUE: 266 Ohm
MDC IDC MSMT LEADCHNL RV PACING THRESHOLD AMPLITUDE: 0.875 V
MDC IDC MSMT LEADCHNL RV SENSING INTR AMPL: 8 mV

## 2018-03-15 LAB — BASIC METABOLIC PANEL
Anion gap: 12 (ref 5–15)
BUN: 18 mg/dL (ref 6–20)
CO2: 21 mmol/L — ABNORMAL LOW (ref 22–32)
Calcium: 9 mg/dL (ref 8.9–10.3)
Chloride: 108 mmol/L (ref 98–111)
Creatinine, Ser: 1.61 mg/dL — ABNORMAL HIGH (ref 0.44–1.00)
GFR calc Af Amer: 42 mL/min — ABNORMAL LOW (ref >60)
GFR calc non Af Amer: 36 mL/min — ABNORMAL LOW (ref >60)
Glucose, Bld: 66 mg/dL — ABNORMAL LOW (ref 70–99)
Potassium: 3.8 mmol/L (ref 3.5–5.1)
Sodium: 141 mmol/L (ref 135–145)

## 2018-03-15 MED ORDER — SPIRONOLACTONE 25 MG PO TABS: 25.0000 mg | ORAL_TABLET | Freq: Every day | ORAL | 11 refills | Status: DC

## 2018-03-15 NOTE — Progress Notes (Signed)
12/16/22HF MD: Aundra Dubin  HPI:  Ms Mapel is a 52 y.o. 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 At one point EF improved to 45% from 20%. She also has a history VT with Medtronic ICD placed in [place]. In 12/17, she was admitted to [facility] with new onset atrial fibrillation/RVR and volume overload. Required IV diuresis and cardizem drip.  She remained in atrial fibrillation.  Warfarin was started that admission.   On 03/26/16, she saw Dr Percival Spanish and she was set up TEE/DCCV. Creatinine at that time was 2.03. She had had a functional decline and dyspnea at rest. Weight at home had been trending up from 290 to 309 pounds. SBP in 90s at Dr. Rosezella [place] office.   On 04/08/16, she presented for scheduled TEE/DC-CV, however thrombus was noted in LA appendage so DC-CV was not pursued. TEE also showed severe LV dysfunction. Dyspneic at rest. SBP soft.  She was admitted and ultimately required initiation of milrinone gtt + diuresis with IV Lasix and metolazone.  She lost > 20 lbs.  We were able to titrate her off milrinone and she was sent home on torsemide.  Rate was difficult to control, Toprol XL was titrated up to 75 mg bid.   She had gastric bypass in 3/19 at [facility].   She saw Dr. Rayann Heman for evaluation for possible afib ablation.  He recommended initial weight loss + amiodarone, then try to stop amiodarone after she has lost a fair amount of weight. She lost weight and is now off amiodarone.  Echo in 8/19 showed EF 40-45%.   She returns for pharmacist-led HF medication titration. At last HF clinic visit on 11/15, her Delene Loll was increased to 24-26 mg BID from once daily, she was started on spironolactone 12.5 mg daily and her levothyroxine was decreased to 50 mcg daily before breakfast.  Weight is down another 4 lbs.  She feels good overall, using torsemide occasionally when notices edema in hands. No significant exertional dyspnea. She is  limited mainly by knee pain.  No chest pain.  Occasional lightheadedness if she sits for a long time then stands.  No orthopnea/PND.   Marland Kitchen Shortness of breath/dyspnea on exertion? no  . Orthopnea/PND? no . Edema? no . Lightheadedness/dizziness? Yes - rarely upon standing too quickly . Daily weights at home? Yes - slowly trending down since bypass surgery ~218 lb . Blood pressure/heart rate monitoring at home? no . Following low-sodium/fluid-restricted diet? yes  HF Medications: Metoprolol succinate 100 mg PO BID Entresto 24-26 mg PO BID Spironolactone 12.5 mg PO daily Torsemide 20 mg PO daily PRN weight gain/edema - twice in the past week  Has the patient been experiencing any side effects to the medications prescribed?  no  Does the patient have any problems obtaining medications due to transportation or finances?   Yes - No Rx insurance  Understanding of regimen: fair Understanding of indications: fair Potential of compliance: fair Patient understands to avoid NSAIDs. Patient understands to avoid decongestants.    Pertinent Lab Values: . 03/15/18 Serum creatinine 1.61, BUN 18, Potassium 3.8, Sodium 141  Vital Signs: . Weight: 214.4 lb (dry weight: 218 lb) . Blood pressure: 126/86 mmHg  . Heart rate: 62 bpm   Assessment: 1. Chronic systolic CHF (EF 78-29>>56-21%), due to NICM (?peripartum). NYHA class II symptoms.  - Volume status stable  - Increase spironolactone to 25 mg daily  - Continue metoprolol succinate 100 mg BID, Entresto 24-26 mg BID and torsemide 20  mg daily PRN weight gain/edema  - Basic disease state pathophysiology, medication indication, mechanism and side effects reviewed at length with patient and she verbalized understanding  2. Atrial flutter/fibrillation: Noted in 12/17.  TEE in 1/18 showed LA appendage thrombus. She is now on Eliquis. Suspect atrial fibrillation with RVR drove her last CHF decompensation.  She remains in NSR today, now off amiodarone.  Weight loss likely will help her maintain NSR.  -If atrial fibrillation recurs off amiodarone, would consider atrial fibrillation ablation.  - Continue Toprol XL100 bid - Continue Eliquis   3. CKD: Stage III.  She has an atrophic left kidney.  BMET today.   4. H/o VT: Has Medtronic ICD.  5. OSA: Uses CPAP.    6. Iatrogenic hyperthyroidism:  - Low TSH recently, decrease Levoxyl to 50 mcg daily - Repeat TSH today   Plan: 1) Medication changes: Based on clinical presentation, vital signs and recent labs will increase spironolactone to 25 mg daily 2) Labs: BMET/TSH today 3) Follow-up: Dr. Aundra Dubin on 05/18/18   Ruta Hinds. Velva Harman, PharmD, BCPS, CPP Clinical Pharmacist Phone: [phone] 03/15/2018 10:37 AM

## 2018-03-15 NOTE — Patient Instructions (Addendum)
It was great to see you today!  Please INCREASE spironolactone to 1 tablet (25 mg) ONCE DAILY.   Blood work today. We will call you with any changes.   Please keep your appointment with Dr. Aundra Dubin on 05/18/18.

## 2018-03-15 NOTE — Progress Notes (Signed)
Wound check appointment. Steri-strips removed. Wound without redness or edema. Incision edges approximated, wound well healed. Normal device function. Threshold, sensing, and impedances consistent with implant measurements. Device programmed at 3.5V for extra safety margin until 3 month visit. Histogram distribution appropriate for patient and level of activity. No AF episodes or ventricular arrhythmias noted. Patient educated about wound care, arm mobility, lifting restrictions, shock plan. ROV in 3 months with GT. 

## 2018-03-16 ENCOUNTER — Ambulatory Visit (HOSPITAL_COMMUNITY): Payer: Medicare Other

## 2018-03-16 ENCOUNTER — Other Ambulatory Visit (HOSPITAL_COMMUNITY): Payer: Medicare Other

## 2018-03-18 ENCOUNTER — Other Ambulatory Visit (HOSPITAL_COMMUNITY): Payer: Self-pay | Admitting: Internal Medicine

## 2018-03-19 ENCOUNTER — Other Ambulatory Visit: Payer: Self-pay | Admitting: Cardiology

## 2018-04-07 ENCOUNTER — Other Ambulatory Visit (HOSPITAL_COMMUNITY): Payer: Self-pay | Admitting: Cardiology

## 2018-04-10 LAB — CUP PACEART REMOTE DEVICE CHECK
Battery Voltage: 2.62 V
Brady Statistic RV Percent Paced: 0.04 %
Date Time Interrogation Session: 20191107083326
HIGH POWER IMPEDANCE MEASURED VALUE: 57 Ohm
HIGH POWER IMPEDANCE MEASURED VALUE: 72 Ohm
Lead Channel Impedance Value: 532 Ohm
Lead Channel Pacing Threshold Amplitude: 1.875 V
Lead Channel Pacing Threshold Pulse Width: 0.4 ms
Lead Channel Setting Pacing Amplitude: 3 V
Lead Channel Setting Pacing Pulse Width: 1 ms
Lead Channel Setting Sensing Sensitivity: 0.3 mV
MDC IDC MSMT LEADCHNL RV SENSING INTR AMPL: 3.375 mV
MDC IDC MSMT LEADCHNL RV SENSING INTR AMPL: 3.375 mV
MDC IDC PG IMPLANT DT: 20100824

## 2018-05-10 NOTE — Progress Notes (Signed)
No ICM remote transmission received for 05/07/2018 and next ICM transmission scheduled for 05/17/2018.

## 2018-05-17 ENCOUNTER — Ambulatory Visit (INDEPENDENT_AMBULATORY_CARE_PROVIDER_SITE_OTHER): Payer: Medicare Other

## 2018-05-17 DIAGNOSIS — I5032 Chronic diastolic (congestive) heart failure: Secondary | ICD-10-CM

## 2018-05-17 DIAGNOSIS — Z95810 Presence of automatic (implantable) cardiac defibrillator: Secondary | ICD-10-CM | POA: Diagnosis not present

## 2018-05-18 ENCOUNTER — Telehealth (HOSPITAL_COMMUNITY): Payer: Self-pay

## 2018-05-18 ENCOUNTER — Encounter (HOSPITAL_COMMUNITY): Payer: Self-pay | Admitting: Cardiology

## 2018-05-18 ENCOUNTER — Ambulatory Visit (HOSPITAL_COMMUNITY)
Admission: RE | Admit: 2018-05-18 | Discharge: 2018-05-18 | Disposition: A | Discharge status: Home / Self Care | Payer: Medicare Other | Source: Ambulatory Visit | Attending: Cardiology | Admitting: Cardiology

## 2018-05-18 VITALS — BP 105/54 | HR 70 | Wt 208.6 lb

## 2018-05-18 DIAGNOSIS — Z79899 Other long term (current) drug therapy: Secondary | ICD-10-CM | POA: Diagnosis not present

## 2018-05-18 DIAGNOSIS — E039 Hypothyroidism, unspecified: Secondary | ICD-10-CM | POA: Diagnosis not present

## 2018-05-18 DIAGNOSIS — M109 Gout, unspecified: Secondary | ICD-10-CM | POA: Insufficient documentation

## 2018-05-18 DIAGNOSIS — G4733 Obstructive sleep apnea (adult) (pediatric): Secondary | ICD-10-CM | POA: Insufficient documentation

## 2018-05-18 DIAGNOSIS — I428 Other cardiomyopathies: Secondary | ICD-10-CM | POA: Insufficient documentation

## 2018-05-18 DIAGNOSIS — Z9884 Bariatric surgery status: Secondary | ICD-10-CM | POA: Diagnosis not present

## 2018-05-18 DIAGNOSIS — I130 Hypertensive heart and chronic kidney disease with heart failure and stage 1 through stage 4 chronic kidney disease, or unspecified chronic kidney disease: Secondary | ICD-10-CM | POA: Diagnosis not present

## 2018-05-18 DIAGNOSIS — Z7901 Long term (current) use of anticoagulants: Secondary | ICD-10-CM | POA: Diagnosis not present

## 2018-05-18 DIAGNOSIS — E1122 Type 2 diabetes mellitus with diabetic chronic kidney disease: Secondary | ICD-10-CM | POA: Diagnosis not present

## 2018-05-18 DIAGNOSIS — I4819 Other persistent atrial fibrillation: Secondary | ICD-10-CM | POA: Insufficient documentation

## 2018-05-18 DIAGNOSIS — Z8249 Family history of ischemic heart disease and other diseases of the circulatory system: Secondary | ICD-10-CM | POA: Insufficient documentation

## 2018-05-18 DIAGNOSIS — N183 Chronic kidney disease, stage 3 (moderate): Secondary | ICD-10-CM | POA: Diagnosis not present

## 2018-05-18 DIAGNOSIS — I5022 Chronic systolic (congestive) heart failure: Secondary | ICD-10-CM | POA: Diagnosis present

## 2018-05-18 LAB — BASIC METABOLIC PANEL
Anion gap: 10 (ref 5–15)
BUN: 24 mg/dL — ABNORMAL HIGH (ref 6–20)
CO2: 17 mmol/L — ABNORMAL LOW (ref 22–32)
Calcium: 9.3 mg/dL (ref 8.9–10.3)
Chloride: 113 mmol/L — ABNORMAL HIGH (ref 98–111)
Creatinine, Ser: 1.88 mg/dL — ABNORMAL HIGH (ref 0.44–1.00)
GFR calc Af Amer: 34 mL/min — ABNORMAL LOW (ref >60)
GFR, EST NON AFRICAN AMERICAN: 30 mL/min — AB (ref >60)
Glucose, Bld: 83 mg/dL (ref 70–99)
Potassium: 3.8 mmol/L (ref 3.5–5.1)
Sodium: 140 mmol/L (ref 135–145)

## 2018-05-18 LAB — TSH: TSH: <0.01 u[IU]/mL — ABNORMAL LOW (ref 0.350–4.500)

## 2018-05-18 LAB — T4, FREE: Free T4: 1.3 ng/dL (ref 0.82–1.77)

## 2018-05-18 MED ORDER — SACUBITRIL-VALSARTAN 24-26 MG PO TABS: 1.0000 | ORAL_TABLET | Freq: Two times a day (BID) | ORAL | 11 refills | Status: DC

## 2018-05-18 NOTE — Progress Notes (Signed)
Entresto 24/26mg  2 bottles of samples given to patient  Lot [id number] [id number] 2022

## 2018-05-18 NOTE — Patient Instructions (Addendum)
RESTART Entresto 24/26mg  (1 tab) twice a day samples provided.  Labs today We will only contact you if something comes back abnormal or we need to make some changes. Otherwise no news is good news!  Your physician recommends that you schedule a follow-up appointment in: 3 months with Dr Aundra Dubin

## 2018-05-18 NOTE — Telephone Encounter (Signed)
Pt aware of Medtronik device reading from office visit. Results normal. Per Dr. Aundra Dubin, no evidence of arrhythmias noted on report. Pt appreciative

## 2018-05-18 NOTE — Progress Notes (Signed)
CSW consulted to speak with pt regarding Entresto payment concerns.  Pt had applied for [employer] patient assistance last year but was informed she needed pt pursue [place] Medicaid prior to being approved for [employer]- has been temporarily receiving Entresto until she gets determination.  Patient denied for [facility] and sent in that notice to [employer] 1.5 weeks ago and has not heard back.  CSW discussed options if [employer] does not approve her.  Per patient she was informed she might be eligible for [facility] prescription plan and pt plans to apply to this program.  Pt would not be good candidate for [employer] foundation as she has no insurance and cost of Delene Loll is $500 so the grant would only last her 2 months- more permanent options will be for [employer] or for her to get [place] prescription Medicaid plan.  Pt states she has looked into Part D coverage through Medicare but it was cost prohibitive discussed her talking with someone to find out which plan will be most cost effective for her.  CSW called [employer] who states they don't have the Medicaid denial letter on file- CSW left message for pt information and requesting she send it again  CSW will continue to follow and assist as needed  Jorge Ny, [place] Worker [facility] [phone]

## 2018-05-18 NOTE — Progress Notes (Signed)
PCP: Dr. Sherrie Sport HF Cardiology: Dr. Aundra Dubin  Ms Dahlem is a 53 y.o. 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 At one point EF improved to 45% from 20%.  She also has a history VT with Medtronic ICD placed in [place]. In 12/17, she was  admitted to [facility] with new onset atrial fibrillation/RVR and volume overload. Required IV diuresis and cardizem drip.  She remained in atrial fibrillation.  Warfarin was started that admission.   On 03/26/16, she saw Dr Percival Spanish and she was set up TEE/DCCV. Creatinine at that time was 2.03.  She had had a functional decline and dyspnea at rest. Weight at home had been trending up from 290 to 309 pounds.  SBP in 90s at Dr. Rosezella [place] office.   On 04/08/16, she presented for scheduled TEE/DC-CV, however thrombus was noted in LA appendage so DC-CV was not pursued. TEE also showed severe LV dysfunction. Dyspneic at rest. SBP soft.  She was admitted and ultimately required initiation of milrinone gtt + diuresis with IV Lasix and metolazone.  She lost > 20 lbs.  We were able to titrate her off milrinone and she was sent home on torsemide.  Rate was difficult to control, Toprol XL was titrated up to 75 mg bid.   At appointment in 2/18, she was noted to be back in NSR.  She remains in NSR today.   She had gastric bypass in 3/19 at [facility]   She saw Dr. Rayann Heman for evaluation for possible afib ablation.  He recommended initial weight loss + amiodarone, then try to stop amiodarone after she has lost a fair amount of weight. She lost weight and is now off amiodarone.  Echo in 8/19 showed EF 40-45%.   She returns for followup of CHF.  Weight is down another 13 lbs.  She is doing well symptomatically.  No significant exertional dyspnea. No chest pain.  No orthopnea/PND. She had 1 episode with brief palpitations last week, no lightheadedness/syncope.  She is in NSR today.  She is out of [place].   Labs (1/18): digoxin  0.5, K 4.5, creatinine 2.13, digoxin 1.7, AST 53, ALT 34, TSH elevated Labs (2/18): K 3.9, creatinine 1.98 => 1.6, TSH 14, normal free T3 and free T4, LFTs normal, digoxin 1.7 Labs (3/18): K 3.8, creatinine 1.8, digoxin 1.0 Labs (12/18): K 4, creatinine 1.62 Labs (3/19): creatinine 2.06 Labs (7/19): K 4, creatinine 1.66 Labs (8/19): hgb 12.7 Labs (9/19): K 4.4, creatinine 1.44 Labs (12/19): K 3.8, creatinine 1.61  Medtronic device interrogated: Stable thoracic impedance, no VT or AF.   ECG (personally reviewed): NSR, anteroseptal Qs  PMH: 1. Chronic systolic CHF: Nonischemic cardiomyopathy.  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  EF initially 20%, rose to 45% at one point.   - h/o VT => Medtronic ICD.  - Echo (1/18): EF 25-30%, mildly dilated RV with normal systolic function, moderate TR, PASP 31 mmHg.  - LHC/RHC (1/18): No CAD; mean RA 19, PA 46/20 mean 35, mean PCWP 24, CI 2.37 (Thermo), CI 3.16 (Fick), PVR 1.9 WU.  - Echo (8/19): EF 40-45%, severe LV dilation, no significant MR 2. Atrial fibrillation: Persistent.  Diagnosed 12/17 when she presented to [facility] with afib/RVR.  TEE/DCCV set up in 1/18 but no DCCV due to LA thrombus on TEE.   3. Gout 4. Type II diabetes 5. HTN 6. OSA: Uses CPAP 7. CKD stage III: Has history of atrophic left kidney  8. Subclinical  hypothyroidism 9. VT: 1/18, had ICD discharge.  10. Gastric bypass 3/19.   SH: Divorced, on disability, lives with parents in [place], nonsmoker, no ETOH, no drugs.   Family History  Problem Relation Age of Onset  . Other Mother        has chronic pain syndrome from DDD with spinal cord stimulator  . Hypertension Mother   . COPD Father   . Arthritis/Rheumatoid Father    ROS: All systems reviewed and negative except as per HPI.   Current Outpatient Medications  Medication Sig Dispense Refill  . apixaban (ELIQUIS) 5 MG TABS tablet Take 1 tablet (5 mg total) by mouth 2 (two) times daily.  180 tablet 3  . Calcium Citrate-Vitamin D (CALCIUM CITRATE + D) 250-200 MG-UNIT TABS Take 1 tablet by mouth daily.    . citalopram (CELEXA) 10 MG tablet Take 10 mg by mouth daily.     . diphenhydramine-acetaminophen (TYLENOL PM) 25-500 MG TABS tablet Take 1 tablet by mouth at bedtime as needed (sleep).    . ferrous sulfate 325 (65 FE) MG tablet Take 325 mg by mouth daily with breakfast.    . levothyroxine (SYNTHROID, LEVOTHROID) 50 MCG tablet Take 50 mcg by mouth daily before breakfast.    . metoprolol succinate (TOPROL-XL) 100 MG 24 hr tablet Take 100 mg by mouth 2 (two) times daily. Take with or immediately following a meal.    . Multiple Vitamin (MULTIVITAMIN) tablet Take 1 tablet by mouth daily.    . pantoprazole (PROTONIX) 40 MG tablet Take 40 mg by mouth daily.    Marland Kitchen spironolactone (ALDACTONE) 25 MG tablet TAKE ONE TABLET BY MOUTH EACH DAY 30 tablet 11  . torsemide (DEMADEX) 20 MG tablet Take 1 tablet (20 mg total) by mouth daily as needed (For swelling, weight gain). 30 tablet 11  . vitamin B-12 (CYANOCOBALAMIN) 1000 MCG tablet Take 1,000 mcg by mouth daily.    . promethazine (PHENERGAN) 25 MG tablet Take 25 mg by mouth every 8 (eight) hours as needed for nausea or vomiting.    . sacubitril-valsartan (ENTRESTO) 24-26 MG Take 1 tablet by mouth 2 (two) times daily. 60 tablet 11   No current facility-administered medications for this encounter.    BP (!) 105/54   Pulse 70   Wt 94.6 kg (208 lb 9.6 oz)   LMP 03/14/2014 Comment: neg preg test  SpO2 98%   BMI 32.67 kg/m  General: NAD Neck: No JVD, no thyromegaly or thyroid nodule.  Lungs: Clear to auscultation bilaterally with normal respiratory effort. CV: Nondisplaced PMI.  Heart regular S1/S2, no S3/S4, no murmur.  No peripheral edema.  No carotid bruit.  Normal pedal pulses.  Abdomen: Soft, nontender, no hepatosplenomegaly, no distention.  Skin: Intact without lesions or rashes.  Neurologic: Alert and oriented x 3.  Psych: Normal  affect. Extremities: No clubbing or cyanosis.  HEENT: Normal.   Assessment/Plan: 1. Chronic systolic CHF: Echo 8/11 with EF 25-30%, nonischemic cardiomyopathy. Cardiomyopathy known since 107. Possibly peri-partum. SPEP negative.  Has Medtronic ICD. LHC (1/18) with no coronary disease.  She was admitted in 1/18 with extensive diuresis, required milrinone due to low output but was able to titrate off.  When RHC was done, cardiac output was ok off milrinone. Most recent echo 8/19 with EF up to 40-45%.  NYHA class II symptoms now, she is not volume overloaded on exam or by Optivol and weight has been coming down.   - She can continue to use torsemide prn (  has not needed).   - Continue Toprol XL 100 mg bid.    - Continue spironolactone 25 mg daily.  BMET today.    - Restart Entresto 24/26 bid.  2. Atrial flutter/fibrillation:  Noted in 12/17.  TEE in 1/18 showed LA appendage thrombus. She is now on Eliquis. Suspect atrial fibrillation with RVR drove her last CHF decompensation.  She remains in NSR today, now off amiodarone. Weight loss likely will help her maintain NSR.  - If atrial fibrillation recurs off amiodarone, would consider atrial fibrillation ablation.  - Continue Toprol XL 100 bid.   - Continue Eliquis  3. CKD: Stage III.  She has an atrophic left kidney.  BMET today.  4. H/o VT: Has Medtronic ICD. 5. OSA: Uses CPAP.    Followup in 3 months. Will have social worker help her with Delene Loll issues.   Loralie Champagne 05/18/2018

## 2018-05-19 LAB — T3, FREE: T3, Free: 2.5 pg/mL (ref 2.0–4.4)

## 2018-05-19 NOTE — Progress Notes (Signed)
EPIC Encounter for ICM Monitoring  Patient Name: Erin Jarvis is a 53 y.o. female Date: 05/19/2018 Primary Care Physican: [name], MD Primary Cardiologist:Hochrein/McLean Electrophysiologist: Allred Nephrologist: Dr Hollie Salk at [facility] (office visit 05/18/2018) Today's Weight: 208 lbs       Heart failure questions reviewed.  Pt is asymptomactic.  She was not overloaded on exam yesterday, 2/18, at HF clinic visit.   Thoracic impedanceslightly below baseline.    Prescribed:Torsemide20 mgTake 1 tablet (20 mg total) by mouth daily as needed (For swelling, weight gain).  Labs: 05/18/2018 Creatinine 1.88, BUN 24, Potassium 3.8, Sodium 140, GFR 30-34  03/15/2018 Creatinine 1.61, BUN 18, Potassium 3.8, Sodium 141, GFR 36-42  02/22/2018 Creatinine 2.04, BUN 22, Potassium 3.7, Sodium 142, GFR 27-31  02/12/2018 Creatinine 1.29, BUN 10, Potassium 3.8, Sodium 140, GFR 46-53  12/24/2017 Creatinine 1.44, BUN 18, Potassium 4.4, Sodium 141, GFR 40-47  12/09/2017 Creatinine 2.00, BUN 28, Potassium 3.8, Sodium 143, GFR 27-31  11/27/2017 Creatinine 1.48, BUN 16, Potassium 3.5, Sodium 142, GFR 39-45  A complete set of results can be found in Results Review.  Recommendations:No changes and encouraged to call for fluid symptoms.  Follow-up plan: ICM clinic phone appointment on4/08/2018.  Office visit scheduled with Dr Lovena Le 06/01/2018.    Copy of ICM check sent to Dr.Allred.   3 month ICM trend: 05/17/2018    1 Year ICM trend:       [name], RN 05/19/2018 8:44 AM

## 2018-05-28 ENCOUNTER — Ambulatory Visit (HOSPITAL_COMMUNITY)
Admission: RE | Admit: 2018-05-28 | Discharge: 2018-05-28 | Disposition: A | Discharge status: Home / Self Care | Payer: Medicare Other | Source: Ambulatory Visit | Attending: Cardiology | Admitting: Cardiology

## 2018-05-28 DIAGNOSIS — I5023 Acute on chronic systolic (congestive) heart failure: Secondary | ICD-10-CM | POA: Diagnosis not present

## 2018-05-28 LAB — BASIC METABOLIC PANEL
ANION GAP: 12 (ref 5–15)
BUN: 22 mg/dL — ABNORMAL HIGH (ref 6–20)
CO2: 18 mmol/L — ABNORMAL LOW (ref 22–32)
Calcium: 9.3 mg/dL (ref 8.9–10.3)
Chloride: 109 mmol/L (ref 98–111)
Creatinine, Ser: 1.7 mg/dL — ABNORMAL HIGH (ref 0.44–1.00)
GFR calc Af Amer: 39 mL/min — ABNORMAL LOW (ref >60)
GFR calc non Af Amer: 33 mL/min — ABNORMAL LOW (ref >60)
Glucose, Bld: 137 mg/dL — ABNORMAL HIGH (ref 70–99)
Potassium: 3.6 mmol/L (ref 3.5–5.1)
Sodium: 139 mmol/L (ref 135–145)

## 2018-05-28 NOTE — Addendum Note (Signed)
Encounter addended by: Jorge Ny, LCSW on: 05/28/2018 10:37 AM  Actions taken: Clinical Note Signed

## 2018-05-28 NOTE — Progress Notes (Signed)
CSW met with pt during lab visit regarding consult that pt had further concerns about obtaining her Entresto  Pt was able to apply for prescription coverage through [facility] and it was granted- will start in April per patient so she just needs help getting Entresto until then- provided pt with samples- pt states she can manage copays for [place] after insurance starts.  CSW will continue to follow and assist as needed  Jorge Ny, LCSW Clinical Social Worker [facility] [phone]

## 2018-06-01 ENCOUNTER — Encounter: Payer: Self-pay | Admitting: Internal Medicine

## 2018-06-01 ENCOUNTER — Ambulatory Visit (INDEPENDENT_AMBULATORY_CARE_PROVIDER_SITE_OTHER): Payer: Medicare Other | Admitting: Internal Medicine

## 2018-06-01 VITALS — BP 104/68 | HR 81 | Ht 67.0 in | Wt 207.6 lb

## 2018-06-01 DIAGNOSIS — Z95810 Presence of automatic (implantable) cardiac defibrillator: Secondary | ICD-10-CM

## 2018-06-01 DIAGNOSIS — I472 Ventricular tachycardia, unspecified: Secondary | ICD-10-CM

## 2018-06-01 DIAGNOSIS — I4819 Other persistent atrial fibrillation: Secondary | ICD-10-CM | POA: Diagnosis not present

## 2018-06-01 DIAGNOSIS — I5022 Chronic systolic (congestive) heart failure: Secondary | ICD-10-CM

## 2018-06-01 DIAGNOSIS — G4733 Obstructive sleep apnea (adult) (pediatric): Secondary | ICD-10-CM

## 2018-06-01 DIAGNOSIS — I429 Cardiomyopathy, unspecified: Secondary | ICD-10-CM

## 2018-06-01 NOTE — Patient Instructions (Signed)
Medication Instructions:  Your physician recommends that you continue on your current medications as directed. Please refer to the Current Medication list given to you today.  Labwork: None ordered.  Testing/Procedures: None ordered.  Follow-Up: Your physician wants you to follow-up in: 9 months with Dr. Lovena Le.   You will receive a reminder letter in the mail two months in advance. If you don't receive a letter, please call our office to schedule the follow-up appointment.  Remote monitoring is used to monitor your ICD from home. This monitoring reduces the number of office visits required to check your device to one time per year. It allows [place] to keep an eye on the functioning of your device to ensure it is working properly. You are scheduled for a device check from home on 07/05/2018. You may send your transmission at any time that day. If you have a wireless device, the transmission will be sent automatically. After your physician reviews your transmission, you will receive a postcard with your next transmission date.  Any Other Special Instructions Will Be Listed Below (If Applicable).  If you need a refill on your cardiac medications before your next appointment, please call your pharmacy.

## 2018-06-01 NOTE — Progress Notes (Signed)
HPI Erin Jarvis returns today for followup. She is a pleasant 53 yo woman with a h/o peripartum CM, s/p ICD insertion remotely. She underwent generator change out with a new ICD lead about 3 months ago. She has done well in the interim. Her new device was placed under the pectoralis muscle.  Allergies  Allergen Reactions  . Morphine And Related Nausea Only and Other (See Comments)    Feels sick,and like she is going to pass out; can't breathe, starts sweating  . Atorvastatin     Nitemares     Current Outpatient Medications  Medication Sig Dispense Refill  . apixaban (ELIQUIS) 5 MG TABS tablet Take 1 tablet (5 mg total) by mouth 2 (two) times daily. 180 tablet 3  . Calcium Citrate-Vitamin D (CALCIUM CITRATE + D) 250-200 MG-UNIT TABS Take 1 tablet by mouth daily.    . citalopram (CELEXA) 10 MG tablet Take 10 mg by mouth daily.     . diphenhydramine-acetaminophen (TYLENOL PM) 25-500 MG TABS tablet Take 1 tablet by mouth at bedtime as needed (sleep).    . ferrous sulfate 325 (65 FE) MG tablet Take 325 mg by mouth daily with breakfast.    . levothyroxine (SYNTHROID, LEVOTHROID) 50 MCG tablet Take 50 mcg by mouth daily before breakfast.    . metoprolol succinate (TOPROL-XL) 100 MG 24 hr tablet Take 100 mg by mouth 2 (two) times daily. Take with or immediately following a meal.    . Multiple Vitamin (MULTIVITAMIN) tablet Take 1 tablet by mouth daily.    . pantoprazole (PROTONIX) 40 MG tablet Take 40 mg by mouth daily.    . promethazine (PHENERGAN) 25 MG tablet Take 25 mg by mouth every 8 (eight) hours as needed for nausea or vomiting.    . sacubitril-valsartan (ENTRESTO) 24-26 MG Take 1 tablet by mouth 2 (two) times daily. 60 tablet 11  . spironolactone (ALDACTONE) 25 MG tablet TAKE ONE TABLET BY MOUTH EACH DAY 30 tablet 11  . torsemide (DEMADEX) 20 MG tablet Take 1 tablet (20 mg total) by mouth daily as needed (For swelling, weight gain). 30 tablet 11  . vitamin B-12 (CYANOCOBALAMIN)  1000 MCG tablet Take 1,000 mcg by mouth daily.     No current facility-administered medications for this visit.      Past Medical History:  Diagnosis Date  . AICD (automatic cardioverter/defibrillator) present 2002, 2010   secondary to post-partum cardiomyopathy (1996)  . CHF (congestive heart failure) ([place])   . Diabetes mellitus without complication ([place])   . Hypertension   . Hypertensive cardiovascular disease   . Obesity   . Persistent atrial fibrillation   . Postpartum cardiomyopathy    Last echocardiogram was last year and EF 45 -50 %  . Seasonal allergies   . Sleep apnea    complaint with CPAP  . Ventricular tachycardia (HCC)    s/p ICD Implantation (MDT Secura VR)    ROS:   All systems reviewed and negative except as noted in the HPI.   Past Surgical History:  Procedure Laterality Date  . CARDIAC CATHETERIZATION N/A 04/11/2016   Procedure: Right/Left Heart Cath and Coronary Angiography;  Surgeon: Larey Dresser, MD;  Location: [facility];  Service: Cardiovascular;  Laterality: N/A;  . CARDIAC DEFIBRILLATOR PLACEMENT  08/12/2000, 11/11/2008   Most recent Generator MDT Secura VR placed in [place]  . CESAREAN SECTION  1996  . CHOLECYSTECTOMY  2004  . GASTRIC BYPASS  05/2017  . ICD  GENERATOR CHANGEOUT  03/02/2018  . ICD GENERATOR CHANGEOUT N/A 03/02/2018   Procedure: ICD GENERATOR CHANGEOUT;  Surgeon: Evans Lance, MD;  Location: [facility];  Service: Cardiovascular;  Laterality: N/A;  . IR GENERIC HISTORICAL  06/13/2014   IR RADIOLOGIST EVAL & MGMT 06/13/2014 Greggory Keen, MD [facility]  . IR GENERIC HISTORICAL  04/09/2016   IR FLUORO GUIDE CV LINE LEFT 04/09/2016 Greggory Keen, MD [facility]  . IR GENERIC HISTORICAL  04/09/2016   IR US GUIDE VASC ACCESS RIGHT 04/09/2016 Greggory Keen, MD [facility]  . IR GENERIC HISTORICAL  04/09/2016   IR VENO/EXT/UNI RIGHT 04/09/2016 Greggory Keen, MD [facility]  . IR GENERIC HISTORICAL  04/09/2016     IR US GUIDE VASC ACCESS LEFT 04/09/2016 Greggory Keen, MD [facility]  . LEAD REVISION/REPAIR N/A 03/02/2018   Procedure: LEAD REVISION/REPAIR;  Surgeon: Evans Lance, MD;  Location: [facility];  Service: Cardiovascular;  Laterality: N/A;  . TEE WITHOUT CARDIOVERSION N/A 04/08/2016   Procedure: TRANSESOPHAGEAL ECHOCARDIOGRAM (TEE);  Surgeon: Pixie Casino, MD;  Location: [facility];  Service: Cardiovascular;  Laterality: N/A;     Family History  Problem Relation Age of Onset  . Other Mother        has chronic pain syndrome from DDD with spinal cord stimulator  . Hypertension Mother   . COPD Father   . Arthritis/Rheumatoid Father      Social History   Socioeconomic History  . Marital status: Divorced    Spouse name: Not on file  . Number of children: Not on file  . Years of education: Not on file  . Highest education level: Not on file  Occupational History  . Not on file  Social Needs  . Financial resource strain: Not on file  . Food insecurity:    Worry: Not on file    Inability: Not on file  . Transportation needs:    Medical: Not on file    Non-medical: Not on file  Tobacco Use  . Smoking status: Never Smoker  . Smokeless tobacco: Never Used  Substance and Sexual Activity  . Alcohol use: No    Alcohol/week: 0.0 standard drinks  . Drug use: No  . Sexual activity: Not on file  Lifestyle  . Physical activity:    Days per week: Not on file    Minutes per session: Not on file  . Stress: Not on file  Relationships  . Social connections:    Talks on phone: Not on file    Gets together: Not on file    Attends religious service: Not on file    Active member of club or organization: Not on file    Attends meetings of clubs or organizations: Not on file    Relationship status: Not on file  . Intimate partner violence:    Fear of current or ex partner: Not on file    Emotionally abused: Not on file    Physically abused: Not on file    Forced sexual  activity: Not on file  Other Topics Concern  . Not on file  Social History Narrative   Lives with her parents and 58 year old daughter in [place].     BP 104/68   Pulse 81   Ht 5\' 7"  (1.702 m)   Wt 207 lb 9.6 oz (94.2 kg)   LMP 03/14/2014 Comment: neg preg test  SpO2 99%   BMI 32.51 kg/m   Physical  Exam:  Well appearing NAD HEENT: Unremarkable Neck:  No JVD, no thyromegally Lymphatics:  No adenopathy Back:  No CVA tenderness Lungs:  Clear with no wheezes HEART:  Regular rate rhythm, no murmurs, no rubs, no clicks Abd:  soft, positive bowel sounds, no organomegally, no rebound, no guarding Ext:  2 plus pulses, no edema, no cyanosis, no clubbing Skin:  No rashes no nodules Neuro:  CN II through XII intact, motor grossly intact  EKG - nsr  DEVICE  Normal device function.  See PaceArt for details.   Assess/Plan: 1. Chronic systolic heart failure - she is class 2. She appears euvolemic on exam today. She will continue her current meds. 2. ICD - her Medtronic ICD is working normally. Her incision looks good. 3. Obesity - she has undergone gastric bypass and has lost 100 lbs. She is hoping to lose another 58.  4. VT - she has had NSVT which was miniamlly symptomatic. No change of meds.  Mikle Bosworth.D.

## 2018-06-03 LAB — CUP PACEART INCLINIC DEVICE CHECK
Date Time Interrogation Session: 20200305093543
Implantable Lead Implant Date: 20191203
Implantable Lead Location: 753860
Implantable Pulse Generator Implant Date: 20191203

## 2018-07-05 ENCOUNTER — Other Ambulatory Visit: Payer: Self-pay

## 2018-07-05 ENCOUNTER — Ambulatory Visit (INDEPENDENT_AMBULATORY_CARE_PROVIDER_SITE_OTHER): Payer: Medicare Other

## 2018-07-05 DIAGNOSIS — Z95810 Presence of automatic (implantable) cardiac defibrillator: Secondary | ICD-10-CM | POA: Diagnosis not present

## 2018-07-05 DIAGNOSIS — I5022 Chronic systolic (congestive) heart failure: Secondary | ICD-10-CM | POA: Diagnosis not present

## 2018-07-06 ENCOUNTER — Telehealth: Payer: Self-pay

## 2018-07-06 NOTE — Progress Notes (Signed)
EPIC Encounter for ICM Monitoring  Patient Name: Erin Jarvis is a 53 y.o. female Date: 07/06/2018 Primary Care Physican: [name], MD Primary Cardiologist:[facility] Electrophysiologist: Allred Nephrologist: Dr Hollie Salk at [place] (office visit 05/18/2018)        Attempted call to patient and unable to reach.  Left message to return call. Transmission reviewed.    Thoracic impedance slightly abnormal but almost at baseline.    Prescribed:Torsemide20 mgTake 1 tablet (20 mg total) by mouth daily as needed (For swelling, weight gain).  Labs: 05/18/2018 Creatinine 1.88, BUN 24, Potassium 3.8, Sodium 140, GFR 30-34  03/15/2018 Creatinine 1.61, BUN 18, Potassium 3.8, Sodium 141, GFR 36-42  02/22/2018 Creatinine 2.04, BUN 22, Potassium 3.7, Sodium 142, GFR 27-31  02/12/2018 Creatinine 1.29, BUN 10, Potassium 3.8, Sodium 140, GFR 46-53  12/24/2017 Creatinine 1.44, BUN 18, Potassium 4.4, Sodium 141, GFR 40-47  12/09/2017 Creatinine 2.00, BUN 28, Potassium 3.8, Sodium 143, GFR 27-31  11/27/2017 Creatinine 1.48, BUN 16, Potassium 3.5, Sodium 142, GFR 39-45  A complete set of results can be found in Results Review.  Recommendations:No changes and encouraged to call for fluid symptoms.  Follow-up plan: ICM clinic phone appointment on5/01/2019.    Copy of ICM check sent to Dr.Allred.   3 month ICM trend: 07/05/2018    1 Year ICM trend:       [name], RN 07/06/2018 4:36 PM

## 2018-07-06 NOTE — Telephone Encounter (Signed)
Remote ICM transmission received.  Attempted call to patient regarding ICM remote transmission and left message to return call   

## 2018-07-20 NOTE — Progress Notes (Signed)
Attempted return call to patient as requested by voice mail message.  Left number to return call.

## 2018-08-09 ENCOUNTER — Ambulatory Visit (INDEPENDENT_AMBULATORY_CARE_PROVIDER_SITE_OTHER): Payer: Medicare Other

## 2018-08-09 ENCOUNTER — Other Ambulatory Visit: Payer: Self-pay

## 2018-08-09 DIAGNOSIS — I5022 Chronic systolic (congestive) heart failure: Secondary | ICD-10-CM

## 2018-08-09 DIAGNOSIS — Z95810 Presence of automatic (implantable) cardiac defibrillator: Secondary | ICD-10-CM | POA: Diagnosis not present

## 2018-08-11 NOTE — Progress Notes (Signed)
EPIC Encounter for ICM Monitoring  Patient Name: Erin Jarvis is a 53 y.o. female Date: 08/11/2018 Primary Care Physican: [name], MD Primary Cardiologist:[facility] Electrophysiologist: Allred Nephrologist: Dr Hollie Salk at [place]     Transmission reviewed.  Spoke with patient and she is doing fine.    OptiVol Thoracic impedance normal.  Prescribed:Torsemide20 mgTake 1 tablet (20 mg total) by mouth daily as needed (For swelling, weight gain).  Labs: 05/18/2018 Creatinine1.88, BUN24, Potassium3.8, [id number], [id number]  03/15/2018 Creatinine1.61, BUN18, Potassium3.8, Sodium141, [id number]  02/22/2018 Creatinine2.04, BUN22, Potassium3.7, [id number], [id number]  02/12/2018 Creatinine1.29, BUN10, Potassium3.8, Sodium140, [id number]  12/24/2017 Creatinine1.44, BUN18, Potassium4.4, [id number], [id number]  12/09/2017 Creatinine2.00, BUN28, Potassium3.8, [id number], [id number]  11/27/2017 Creatinine1.48, BUN16, Potassium3.5, [id number], [id number] A complete set of results can be found in Results Review.  Recommendations:No changes and encouraged to call for fluid symptoms.  Follow-up plan: ICM clinic phone appointment on6/15/2020.   Copy of ICM check sent to Dr.Allred.   3 month ICM trend: 08/09/2018    1 Year ICM trend:       [name], RN 08/11/2018 12:07 PM

## 2018-08-12 ENCOUNTER — Other Ambulatory Visit (HOSPITAL_COMMUNITY): Payer: Self-pay | Admitting: *Deleted

## 2018-08-12 MED ORDER — SACUBITRIL-VALSARTAN 24-26 MG PO TABS: 1.0000 | ORAL_TABLET | Freq: Two times a day (BID) | ORAL | 11 refills | Status: DC

## 2018-08-13 ENCOUNTER — Telehealth (HOSPITAL_COMMUNITY): Payer: Self-pay | Admitting: Vascular Surgery

## 2018-08-13 ENCOUNTER — Other Ambulatory Visit (HOSPITAL_COMMUNITY): Payer: Self-pay | Admitting: Cardiology

## 2018-08-13 ENCOUNTER — Telehealth (HOSPITAL_COMMUNITY): Payer: Self-pay | Admitting: *Deleted

## 2018-08-13 MED ORDER — SACUBITRIL-VALSARTAN 24-26 MG PO TABS: 1.0000 | ORAL_TABLET | Freq: Two times a day (BID) | ORAL | 11 refills | Status: DC

## 2018-08-13 NOTE — Telephone Encounter (Signed)
Left pt vm that 5/18 @ 10 am  appt w/ [facility] w/ be canceled , asked pt to call back to make telephone visit

## 2018-08-13 NOTE — Telephone Encounter (Signed)
Pt left VM requesting call back about upcoming appt and refill. Called pt back no answer.left VM.

## 2018-08-16 ENCOUNTER — Encounter (HOSPITAL_COMMUNITY): Payer: Medicare Other | Admitting: Cardiology

## 2018-09-13 ENCOUNTER — Ambulatory Visit (INDEPENDENT_AMBULATORY_CARE_PROVIDER_SITE_OTHER): Payer: Medicare Other

## 2018-09-13 DIAGNOSIS — Z95810 Presence of automatic (implantable) cardiac defibrillator: Secondary | ICD-10-CM | POA: Diagnosis not present

## 2018-09-13 DIAGNOSIS — I5022 Chronic systolic (congestive) heart failure: Secondary | ICD-10-CM

## 2018-09-14 NOTE — Progress Notes (Signed)
EPIC Encounter for ICM Monitoring  Patient Name: Erin Jarvis is a 53 y.o. female Date: 09/14/2018 Primary Care Physican: [name], MD Primary Cardiologist:[facility] Electrophysiologist: Allred Nephrologist: Dr Hollie Salk at [place]     Transmission reviewed.     OptiVol Thoracic impedancenormal.  Prescribed:Torsemide20 mgTake 1 tablet (20 mg total) by mouth daily as needed (For swelling, weight gain).  Labs: 05/18/2018 Creatinine1.88, BUN24, Potassium3.8, [id number], [id number]  03/15/2018 Creatinine1.61, BUN18, Potassium3.8, Sodium141, [id number]  02/22/2018 Creatinine2.04, BUN22, Potassium3.7, [id number], [id number]  02/12/2018 Creatinine1.29, BUN10, Potassium3.8, Sodium140, [id number]  12/24/2017 Creatinine1.44, BUN18, Potassium4.4, [id number], [id number]  12/09/2017 Creatinine2.00, BUN28, Potassium3.8, [id number], [id number]  11/27/2017 Creatinine1.48, BUN16, Potassium3.5, [id number], [id number] A complete set of results can be found in Results Review.  Recommendations:None  Follow-up plan: ICM clinic phone appointment on7/20/2020.   Copy of ICM check sent to Dr.Allred.  3 month ICM trend: 09/13/2018    1 Year ICM trend:       [name], RN 09/14/2018 10:02 AM

## 2018-10-08 ENCOUNTER — Other Ambulatory Visit: Payer: Self-pay

## 2018-10-08 ENCOUNTER — Encounter (HOSPITAL_COMMUNITY): Payer: Self-pay | Admitting: Cardiology

## 2018-10-08 ENCOUNTER — Ambulatory Visit (HOSPITAL_COMMUNITY)
Admission: RE | Admit: 2018-10-08 | Discharge: 2018-10-08 | Disposition: A | Discharge status: Home / Self Care | Payer: Medicare Other | Source: Ambulatory Visit | Attending: Cardiology | Admitting: Cardiology

## 2018-10-08 VITALS — BP 114/82 | HR 66 | Wt 193.4 lb

## 2018-10-08 DIAGNOSIS — I428 Other cardiomyopathies: Secondary | ICD-10-CM | POA: Insufficient documentation

## 2018-10-08 DIAGNOSIS — I4892 Unspecified atrial flutter: Secondary | ICD-10-CM | POA: Diagnosis not present

## 2018-10-08 DIAGNOSIS — Z8261 Family history of arthritis: Secondary | ICD-10-CM | POA: Diagnosis not present

## 2018-10-08 DIAGNOSIS — M109 Gout, unspecified: Secondary | ICD-10-CM | POA: Diagnosis not present

## 2018-10-08 DIAGNOSIS — Z9884 Bariatric surgery status: Secondary | ICD-10-CM | POA: Diagnosis not present

## 2018-10-08 DIAGNOSIS — G4733 Obstructive sleep apnea (adult) (pediatric): Secondary | ICD-10-CM | POA: Diagnosis not present

## 2018-10-08 DIAGNOSIS — I5022 Chronic systolic (congestive) heart failure: Secondary | ICD-10-CM | POA: Diagnosis present

## 2018-10-08 DIAGNOSIS — Z8269 Family history of other diseases of the musculoskeletal system and connective tissue: Secondary | ICD-10-CM | POA: Diagnosis not present

## 2018-10-08 DIAGNOSIS — M1710 Unilateral primary osteoarthritis, unspecified knee: Secondary | ICD-10-CM | POA: Diagnosis not present

## 2018-10-08 DIAGNOSIS — I472 Ventricular tachycardia, unspecified: Secondary | ICD-10-CM

## 2018-10-08 DIAGNOSIS — Z79890 Hormone replacement therapy (postmenopausal): Secondary | ICD-10-CM | POA: Diagnosis not present

## 2018-10-08 DIAGNOSIS — Z8249 Family history of ischemic heart disease and other diseases of the circulatory system: Secondary | ICD-10-CM | POA: Diagnosis not present

## 2018-10-08 DIAGNOSIS — S8990XA Unspecified injury of unspecified lower leg, initial encounter: Secondary | ICD-10-CM | POA: Diagnosis not present

## 2018-10-08 DIAGNOSIS — E1122 Type 2 diabetes mellitus with diabetic chronic kidney disease: Secondary | ICD-10-CM | POA: Insufficient documentation

## 2018-10-08 DIAGNOSIS — Z79899 Other long term (current) drug therapy: Secondary | ICD-10-CM | POA: Diagnosis not present

## 2018-10-08 DIAGNOSIS — I130 Hypertensive heart and chronic kidney disease with heart failure and stage 1 through stage 4 chronic kidney disease, or unspecified chronic kidney disease: Secondary | ICD-10-CM | POA: Insufficient documentation

## 2018-10-08 DIAGNOSIS — E039 Hypothyroidism, unspecified: Secondary | ICD-10-CM | POA: Insufficient documentation

## 2018-10-08 DIAGNOSIS — Z825 Family history of asthma and other chronic lower respiratory diseases: Secondary | ICD-10-CM | POA: Diagnosis not present

## 2018-10-08 DIAGNOSIS — I4891 Unspecified atrial fibrillation: Secondary | ICD-10-CM | POA: Insufficient documentation

## 2018-10-08 DIAGNOSIS — N183 Chronic kidney disease, stage 3 (moderate): Secondary | ICD-10-CM | POA: Insufficient documentation

## 2018-10-08 DIAGNOSIS — Z7901 Long term (current) use of anticoagulants: Secondary | ICD-10-CM | POA: Insufficient documentation

## 2018-10-08 LAB — BASIC METABOLIC PANEL
Anion gap: 12 (ref 5–15)
BUN: 31 mg/dL — ABNORMAL HIGH (ref 6–20)
CO2: 15 mmol/L — ABNORMAL LOW (ref 22–32)
Calcium: 9.8 mg/dL (ref 8.9–10.3)
Chloride: 108 mmol/L (ref 98–111)
Creatinine, Ser: 2.39 mg/dL — ABNORMAL HIGH (ref 0.44–1.00)
GFR calc Af Amer: 26 mL/min — ABNORMAL LOW (ref >60)
GFR calc non Af Amer: 22 mL/min — ABNORMAL LOW (ref >60)
Glucose, Bld: 89 mg/dL (ref 70–99)
Potassium: 4 mmol/L (ref 3.5–5.1)
Sodium: 135 mmol/L (ref 135–145)

## 2018-10-08 LAB — TSH: TSH: 0.022 u[IU]/mL — ABNORMAL LOW (ref 0.350–4.500)

## 2018-10-08 LAB — T4, FREE: Free T4: 1.31 ng/dL — ABNORMAL HIGH (ref 0.61–1.12)

## 2018-10-08 MED ORDER — SACUBITRIL-VALSARTAN 49-51 MG PO TABS: 1.0000 | ORAL_TABLET | Freq: Two times a day (BID) | ORAL | 6 refills | Status: DC

## 2018-10-08 NOTE — Patient Instructions (Addendum)
INCREASE Entresto to 49/51mg  (1 tab) twice a day  Labs today and repeat in 10 days. A prescription was provided today. Please fax results to [phone] We will only contact you if something comes back abnormal or we need to make some changes. Otherwise no news is good news!  You have been referred to Dr Meredith Pel at [facility] for consultation regarding a knee replacement.  Your physician has requested that you have an echocardiogram. Echocardiography is a painless test that uses sound waves to create images of your heart. It provides your doctor with information about the size and shape of your heart and how well your heart's chambers and valves are working. This procedure takes approximately one hour. There are no restrictions for this procedure. This is to be done in August.  Your physician recommends that you schedule a follow-up appointment in: 3 months with Dr Aundra Dubin, you will get a call to schedule this appointment.   At the [facility], you and your health needs are our priority. As part of our continuing mission to provide you with exceptional heart care, we have created designated Provider Care Teams. These Care Teams include your primary Cardiologist (physician) and Advanced Practice Providers (APPs- Physician Assistants and Nurse Practitioners) who all work together to provide you with the care you need, when you need it.   You may see any of the following providers on your designated Care Team at your next follow up: Marland Kitchen Dr Glori Bickers . Dr Loralie Champagne . Darrick Grinder, NP

## 2018-10-09 LAB — T3, FREE: T3, Free: 1.7 pg/mL

## 2018-10-10 NOTE — Progress Notes (Signed)
PCP: Dr. Sherrie Sport HF Cardiology: Dr. Aundra Dubin  Erin Jarvis is a 53 y.o. 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 At one point EF improved to 45% from 20%.  She also has a history VT with Medtronic ICD placed in [place]. In 12/17, she was  admitted to [facility] with new onset atrial fibrillation/RVR and volume overload. Required IV diuresis and cardizem drip.  She remained in atrial fibrillation.  Warfarin was started that admission.   On 03/26/16, she saw Dr Percival Spanish and she was set up TEE/DCCV. Creatinine at that time was 2.03.  She had had a functional decline and dyspnea at rest. Weight at home had been trending up from 290 to 309 pounds.  SBP in 90s at Dr. Rosezella [place] office.   On 04/08/16, she presented for scheduled TEE/DC-CV, however thrombus was noted in LA appendage so DC-CV was not pursued. TEE also showed severe LV dysfunction. Dyspneic at rest. SBP soft.  She was admitted and ultimately required initiation of milrinone gtt + diuresis with IV Lasix and metolazone.  She lost > 20 lbs.  We were able to titrate her off milrinone and she was sent home on torsemide.  Rate was difficult to control, Toprol XL was titrated up to 75 mg bid.   At appointment in 2/18, she was noted to be back in NSR.  She remains in NSR today.   She had gastric bypass in 3/19 at [facility].   She saw Dr. Rayann Heman for evaluation for possible afib ablation.  He recommended initial weight loss + amiodarone, then try to stop amiodarone after she has lost a fair amount of weight. She lost weight and is now off amiodarone.  Echo in 8/19 showed EF 40-45%.    She returns for followup of CHF.  Weight is down another 15 lbs.  No exertional dyspnea or chest pain.  She has had some problems with nausea, which she attributes to being out of Protonix.  No orthopnea/PND. Mild lightheadedness if she stands too fast.  She rarely uses torsemide (prn). She has significant right knee pain  and it has been recommended that she have right TKR.  She saw an orthopedic MD in [place] and wants a 2nd opinion in [place].   Labs (1/18): digoxin 0.5, K 4.5, creatinine 2.13, digoxin 1.7, AST 53, ALT 34, TSH elevated Labs (2/18): K 3.9, creatinine 1.98 => 1.6, TSH 14, normal free T3 and free T4, LFTs normal, digoxin 1.7 Labs (3/18): K 3.8, creatinine 1.8, digoxin 1.0 Labs (12/18): K 4, creatinine 1.62 Labs (3/19): creatinine 2.06 Labs (7/19): K 4, creatinine 1.66 Labs (8/19): hgb 12.7 Labs (9/19): K 4.4, creatinine 1.44 Labs (12/19): K 3.8, creatinine 1.61 Labs (2/20): K 3.6, creatinine 1.7 Labs (7/20): K 4, creatinine 2.39, TSH low, free T4 high, T3 normal  Medtronic device interrogated: Stable thoracic impedance, no AF or VT.   ECG (personally reviewed): NSR, low voltage  PMH: 1. Chronic systolic CHF: Nonischemic cardiomyopathy.  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  EF initially 20%, rose to 45% at one point.   - h/o VT => Medtronic ICD.  - Echo (1/18): EF 25-30%, mildly dilated RV with normal systolic function, moderate TR, PASP 31 mmHg.  - LHC/RHC (1/18): No CAD; mean RA 19, PA 46/20 mean 35, mean PCWP 24, CI 2.37 (Thermo), CI 3.16 (Fick), PVR 1.9 WU.  - Echo (8/19): EF 40-45%, severe LV dilation, no significant MR 2. Atrial fibrillation: Persistent.  Diagnosed  12/17 when she presented to [facility] with afib/RVR.  TEE/DCCV set up in 1/18 but no DCCV due to LA thrombus on TEE.   3. Gout 4. Type II diabetes 5. HTN 6. OSA: Uses CPAP 7. CKD stage III: Has history of atrophic left kidney  8. Hypothyroidism 9. VT: 1/18, had ICD discharge.  10. Gastric bypass 3/19.  11. OA  SH: Divorced, on disability, lives with parents in [place], nonsmoker, no ETOH, no drugs.   Family History  Problem Relation Age of Onset  . Other Mother        has chronic pain syndrome from DDD with spinal cord stimulator  . Hypertension Mother   . COPD Father   .  Arthritis/Rheumatoid Father    ROS: All systems reviewed and negative except as per HPI.   Current Outpatient Medications  Medication Sig Dispense Refill  . apixaban (ELIQUIS) 5 MG TABS tablet Take 1 tablet (5 mg total) by mouth 2 (two) times daily. 180 tablet 3  . Calcium Citrate-Vitamin D (CALCIUM CITRATE + D) 250-200 MG-UNIT TABS Take 1 tablet by mouth daily.    . citalopram (CELEXA) 10 MG tablet Take 10 mg by mouth daily.     . diphenhydramine-acetaminophen (TYLENOL PM) 25-500 MG TABS tablet Take 1 tablet by mouth at bedtime as needed (sleep).    . ferrous sulfate 325 (65 FE) MG tablet Take 325 mg by mouth daily with breakfast.    . levothyroxine (SYNTHROID, LEVOTHROID) 50 MCG tablet Take 50 mcg by mouth daily before breakfast.    . metoprolol succinate (TOPROL-XL) 100 MG 24 hr tablet Take 100 mg by mouth 2 (two) times daily. Take with or immediately following a meal.    . Multiple Vitamin (MULTIVITAMIN) tablet Take 1 tablet by mouth daily.    . pantoprazole (PROTONIX) 40 MG tablet Take 40 mg by mouth daily.    Marland Kitchen spironolactone (ALDACTONE) 25 MG tablet TAKE ONE TABLET BY MOUTH EACH DAY 30 tablet 11  . torsemide (DEMADEX) 20 MG tablet Take 1 tablet (20 mg total) by mouth daily as needed (For swelling, weight gain). 30 tablet 11  . vitamin B-12 (CYANOCOBALAMIN) 1000 MCG tablet Take 1,000 mcg by mouth daily.    . sacubitril-valsartan (ENTRESTO) 49-51 MG Take 1 tablet by mouth 2 (two) times daily. 60 tablet 6   No current facility-administered medications for this encounter.    BP 114/82   Pulse 66   Wt 87.7 kg (193 lb 6.4 oz)   LMP 03/14/2014 Comment: neg preg test  SpO2 97%   BMI 30.29 kg/m  General: NAD Neck: No JVD, no thyromegaly or thyroid nodule.  Lungs: Clear to auscultation bilaterally with normal respiratory effort. CV: Nondisplaced PMI.  Heart regular S1/S2, no S3/S4, no murmur.  No peripheral edema.  No carotid bruit.  Normal pedal pulses.  Abdomen: Soft, nontender, no  hepatosplenomegaly, no distention.  Skin: Intact without lesions or rashes.  Neurologic: Alert and oriented x 3.  Psych: Normal affect. Extremities: No clubbing or cyanosis.  HEENT: Normal.   Assessment/Plan: 1. Chronic systolic CHF: Echo 3/41 with EF 25-30%, nonischemic cardiomyopathy. Cardiomyopathy known since 67. Possibly peri-partum. SPEP negative.  Has Medtronic ICD. LHC (1/18) with no coronary disease.  She was admitted in 1/18 with extensive diuresis, required milrinone due to low output but was able to titrate off.  When RHC was done, cardiac output was ok off milrinone. Most recent echo 8/19 with EF up to 40-45%.  NYHA class II symptoms now,  she is not volume overloaded on exam or by Optivol and weight has been coming down.   - She can continue to use torsemide prn (has not needed).   - Continue Toprol XL 100 mg bid.    - Continue spironolactone 25 mg daily.    - I had asked her at appointment today to increase Entresto to 49/51 bid, but with creatinine up on today's BMET, I will have her keep Entresto at 24/26 bid and increase po fluid intake.  - Repeat echo in 8/20.  2. Atrial flutter/fibrillation:  Noted in 12/17.  TEE in 1/18 showed LA appendage thrombus. She is now on Eliquis. Suspect atrial fibrillation with RVR drove her last CHF decompensation.  She remains in NSR today, now off amiodarone. Weight loss likely will help her maintain NSR.  - If atrial fibrillation recurs off amiodarone, would consider atrial fibrillation ablation.  - Continue Toprol XL 100 bid.   - Continue Eliquis  3. CKD: Stage III.  She has an atrophic left kidney.  Creatinine higher at 2.3 today.  4. H/o VT: Has Medtronic ICD. 5. OSA: Uses CPAP.   6. Knee osteoarthritis: Wants 2nd opinion on TKR in [place].  I will refer her to Alphonzo Severance.  7. Hypothyroidism: She has been on Levoxyl but TSH is low and free T4 high (free T3 normal).  I will have her stop Levoxyl for now, repeat TSH in 1 month.    Followup in 3 months.  Erin Jarvis 10/10/2018

## 2018-10-11 ENCOUNTER — Telehealth (HOSPITAL_COMMUNITY): Payer: Self-pay

## 2018-10-11 MED ORDER — PANTOPRAZOLE SODIUM 40 MG PO TBEC: 40.0000 mg | DELAYED_RELEASE_TABLET | Freq: Every day | ORAL | 12 refills | Status: DC

## 2018-10-11 MED ORDER — SACUBITRIL-VALSARTAN 24-26 MG PO TABS: 1.0000 | ORAL_TABLET | Freq: Two times a day (BID) | ORAL | 3 refills | Status: DC

## 2018-10-11 MED FILL — ENTRESTO 24 MG-26 MG TABLET: 24-26 | 30 days supply | Qty: 60 | Fill #0

## 2018-10-11 MED FILL — PANTOPRAZOLE SOD DR 40 MG T: 40 | 30 days supply | Qty: 30 | Fill #0

## 2018-10-11 NOTE — Telephone Encounter (Signed)
Relayed results and med changes to pt. Repeat labs scheduled.

## 2018-10-11 NOTE — Telephone Encounter (Signed)
-----   Message from Larey Dresser, MD sent at 10/08/2018  4:05 PM EDT ----- Stop levothyroxine with hyperthyroidism.  Repeat TSH in 1 month.  Do NOT increase Entresto, keep it at 24/26 bid.  Increase po fluid intake and repeat BMET in 1 week.  Do not use torsemide for now.

## 2018-10-18 ENCOUNTER — Ambulatory Visit (HOSPITAL_COMMUNITY)
Admission: RE | Admit: 2018-10-18 | Discharge: 2018-10-18 | Disposition: A | Discharge status: Home / Self Care | Payer: Medicare Other | Source: Ambulatory Visit | Attending: Cardiology | Admitting: Cardiology

## 2018-10-18 ENCOUNTER — Ambulatory Visit (INDEPENDENT_AMBULATORY_CARE_PROVIDER_SITE_OTHER): Payer: Medicare Other

## 2018-10-18 ENCOUNTER — Other Ambulatory Visit: Payer: Self-pay

## 2018-10-18 DIAGNOSIS — I5022 Chronic systolic (congestive) heart failure: Secondary | ICD-10-CM

## 2018-10-18 DIAGNOSIS — Z95810 Presence of automatic (implantable) cardiac defibrillator: Secondary | ICD-10-CM | POA: Diagnosis not present

## 2018-10-18 LAB — BASIC METABOLIC PANEL
Anion gap: 10 (ref 5–15)
BUN: 13 mg/dL (ref 6–20)
CO2: 16 mmol/L — ABNORMAL LOW (ref 22–32)
Calcium: 9 mg/dL (ref 8.9–10.3)
Chloride: 107 mmol/L (ref 98–111)
Creatinine, Ser: 1.55 mg/dL — ABNORMAL HIGH (ref 0.44–1.00)
GFR calc Af Amer: 43 mL/min — ABNORMAL LOW (ref >60)
GFR calc non Af Amer: 37 mL/min — ABNORMAL LOW (ref >60)
Glucose, Bld: 108 mg/dL — ABNORMAL HIGH (ref 70–99)
Potassium: 3.8 mmol/L (ref 3.5–5.1)
Sodium: 133 mmol/L — ABNORMAL LOW (ref 135–145)

## 2018-10-19 ENCOUNTER — Ambulatory Visit (INDEPENDENT_AMBULATORY_CARE_PROVIDER_SITE_OTHER): Payer: Medicare Other | Admitting: *Deleted

## 2018-10-19 DIAGNOSIS — I472 Ventricular tachycardia, unspecified: Secondary | ICD-10-CM

## 2018-10-19 LAB — CUP PACEART REMOTE DEVICE CHECK
Battery Remaining Longevity: 133 mo
Battery Voltage: 3.05 V
Brady Statistic RV Percent Paced: 0.03 %
Date Time Interrogation Session: 20200721094344
HighPow Impedance: 66 Ohm
Implantable Lead Implant Date: 20191203
Implantable Lead Location: 753860
Implantable Pulse Generator Implant Date: 20191203
Lead Channel Impedance Value: 304 Ohm
Lead Channel Impedance Value: 399 Ohm
Lead Channel Pacing Threshold Amplitude: 0.875 V
Lead Channel Pacing Threshold Pulse Width: 0.4 ms
Lead Channel Sensing Intrinsic Amplitude: 9.625 mV
Lead Channel Sensing Intrinsic Amplitude: 9.625 mV
Lead Channel Setting Pacing Amplitude: 2.5 V
Lead Channel Setting Pacing Pulse Width: 0.4 ms
Lead Channel Setting Sensing Sensitivity: 0.3 mV

## 2018-10-22 ENCOUNTER — Telehealth: Payer: Self-pay

## 2018-10-22 NOTE — Telephone Encounter (Signed)
Remote ICM transmission received.  Attempted call to patient regarding ICM remote transmission and left message, per DPR, to return call.    

## 2018-10-22 NOTE — Progress Notes (Signed)
EPIC Encounter for ICM Monitoring  Patient Name: Erin Jarvis is a 53 y.o. female Date: 10/22/2018 Primary Care Physican: [name], MD Primary Cardiologist:[facility] Electrophysiologist: Allred Nephrologist: Dr Hollie Salk at [place]     Attempted call to patient and unable to reach.  Left message to return call.  Transmission reviewed.   OptiVolThoracic impedancenormal.  Prescribed:Torsemide20 mgTake 1 tablet (20 mg total) by mouth daily as needed (For swelling, weight gain).  Labs: 10/18/2018 Creatinine 1.55, BUN 13, Potassium 3.8, Sodium 133, GFR 37-43 10/08/2018 Creatinine 2.39, BUN 31, Potassium 4.0, Sodium 135, GFR 22-26  05/28/2018 Creatinine 1.70, BUN 22, Potassium 3.6, Sodium 139, GFR 33-39  05/18/2018 Creatinine1.88, BUN24, Potassium3.8, Sodium140, YFR10-21  A complete set of results can be found in Results Review.  Recommendations:Unable to reach  Follow-up plan: ICM clinic phone appointment on8/25/2020.   Copy of ICM check sent to Dr.Allred.  3 month ICM trend: 10/19/2018    1 Year ICM trend:       [name], RN 10/22/2018 9:27 AM

## 2018-11-03 ENCOUNTER — Encounter: Payer: Self-pay | Admitting: Cardiology

## 2018-11-03 ENCOUNTER — Other Ambulatory Visit (HOSPITAL_COMMUNITY): Payer: Self-pay | Admitting: Internal Medicine

## 2018-11-03 NOTE — Progress Notes (Signed)
Remote ICD transmission.   

## 2018-11-08 ENCOUNTER — Ambulatory Visit (HOSPITAL_COMMUNITY): Admission: RE | Admit: 2018-11-08 | Payer: Medicare Other | Source: Ambulatory Visit

## 2018-11-11 ENCOUNTER — Other Ambulatory Visit (HOSPITAL_COMMUNITY): Payer: Medicare Other

## 2018-11-22 ENCOUNTER — Ambulatory Visit (INDEPENDENT_AMBULATORY_CARE_PROVIDER_SITE_OTHER): Payer: Medicare Other | Admitting: Orthopedic Surgery

## 2018-11-22 ENCOUNTER — Encounter: Payer: Self-pay | Admitting: Orthopedic Surgery

## 2018-11-22 ENCOUNTER — Ambulatory Visit (INDEPENDENT_AMBULATORY_CARE_PROVIDER_SITE_OTHER): Payer: Medicare Other

## 2018-11-22 VITALS — Ht 66.0 in | Wt 183.0 lb

## 2018-11-22 DIAGNOSIS — M25561 Pain in right knee: Secondary | ICD-10-CM

## 2018-11-22 DIAGNOSIS — G8929 Other chronic pain: Secondary | ICD-10-CM | POA: Diagnosis not present

## 2018-11-22 DIAGNOSIS — M2391 Unspecified internal derangement of right knee: Secondary | ICD-10-CM

## 2018-11-22 NOTE — Progress Notes (Signed)
Office Visit Note   Patient: Erin Jarvis           Date of Birth: 07-30-1965           MRN: [id number] Visit Date: 11/22/2018 Requested by: Larey Dresser, MD [phone] [place],  [place] [address] PCP: Patient, No Pcp Per  Subjective: Chief Complaint  Patient presents with  . Right Knee - Pain    HPI: Erin Jarvis is a 53 y.o. female who presents to the office complaining of knee injury.  Patient states that she fell about a month ago.  She landed on her right knee and since then she notes painful weightbearing with an inability to fully extend her right knee.  This inability to extend is made it difficult to ambulate.  She notes weakness and giving way as well as swelling.  She denies any locking or catching.  Her pain occasionally wakes her up at night.  She was seen in [place] by an orthopedic surgeon 2 weeks ago gave her cortisone injection in the right knee that only provided 1 day of relief and did not make any difference in her ability to fully straighten her right knee.  Patient has been taking tramadol and Tylenol for pain.  She is on Eliquis so she cannot take NSAIDs.  She has a history of patellar dislocation that was treated without surgery.  Also has a history of smoking, gastric bypass, postpartum cardiomyopathy, defibrillator (due to history of V. tach).             ROS:  All systems reviewed are negative as they relate to the chief complaint within the history of present illness.  Patient denies fevers or chills.  Assessment & Plan: Visit Diagnoses:  1. Chronic pain of right knee     Plan: Patient is a 53 year old female who presents with right knee pain and extension block following right knee injury.  Her symptoms were improved with cortisone injection 2 weeks ago for 1 day, but the injection made no difference to her extension block.  X-rays reveal no significant findings to explain her extension block; no fracture or dislocation and no  end-stage arthritis.  Patient cannot have an MRI due to her defibrillator so we will proceed with arthroscopy.  I am suspicious of a meniscal tear or some sort of loose body that is preventing her knee from fully extending.  Want to proceed with this sooner rather than later to avoid her developing a flexion contracture.  Patient agreed with the plan and will be scheduled for surgery.  She will follow-up after her procedure.  Follow-Up Instructions: No follow-ups on file.   Orders:  Orders Placed This Encounter  Procedures  . XR KNEE 3 VIEW RIGHT   No orders of the defined types were placed in this encounter.     Procedures: No procedures performed   Clinical Data: No additional findings.  Objective: Vital Signs: Ht 5\' 6"  (1.676 m)   Wt 183 lb (83 kg)   LMP 03/14/2014 Comment: neg preg test  BMI 29.54 kg/m   Physical Exam:  Constitutional: Patient appears well-developed HEENT:  Head: Normocephalic Eyes:EOM are normal Neck: Normal range of motion Cardiovascular: Normal rate Pulmonary/chest: Effort normal Neurologic: Patient is alert Skin: Skin is warm Psychiatric: Patient has normal mood and affect  Ortho Exam:  Right knee Exam Mild to moderate effusion Extensor mechanism intact No TTP over quad tendon, patellar tendon, pes anserinus, tibial tubercle, LCL/MCL  insertions TTP over the medial and lateral joint lines and lateral patella Stable to varus/valgus stresses.  Stable to anterior/posterior drawer Extension to 15 degrees Flexion > 90 degrees  Specialty Comments:  No specialty comments available.  Imaging: No results found.   PMFS History: Patient Active Problem List   Diagnosis Date Noted  . Cardiomyopathy ([facility]) 03/02/2018  . Acute on chronic systolic CHF (congestive heart failure), NYHA class 4 ([place]) 04/08/2016  . Atrial fibrillation ([facility]) [I48.91] 04/03/2016  . Encounter for therapeutic drug monitoring 04/03/2016  . Persistent atrial fibrillation  03/26/2016  . Fibroids, intramural 05/15/2014  . Fibroid 05/15/2014  . Intramural leiomyoma of uterus   . Morbid obesity ([facility]) 01/21/2014  . Sleep apnea 01/21/2014  . Fatigue 12/17/2012  . Snoring 12/17/2012  . Congestive heart failure, unspecified 12/15/2011  . Ventricular tachycardia ([facility]) 12/15/2011  . Postpartum cardiomyopathy   . Unspecified essential hypertension   . Artificial cardiac pacemaker 03/22/2009  . Benign essential HTN 08/30/2008  . Thromboembolism of vein 05/19/2006  . History of anticoagulant therapy 05/19/2006  . Anasarca 04/29/2006  . Polypharmacy 12/09/2004  . Automatic implantable cardioverter-defibrillator in situ 12/09/2004  . Dilated cardiomyopathy ([facility]) 12/09/2004  . CCF (congestive cardiac failure) ([facility]) 12/09/2004   Past Medical History:  Diagnosis Date  . AICD (automatic cardioverter/defibrillator) present 2002, 2010   secondary to post-partum cardiomyopathy (1996)  . CHF (congestive heart failure) ([facility])   . Diabetes mellitus without complication ([facility])   . Hypertension   . Hypertensive cardiovascular disease   . Obesity   . Persistent atrial fibrillation   . Postpartum cardiomyopathy    Last echocardiogram was last year and EF 45 -50 %  . Seasonal allergies   . Sleep apnea    complaint with CPAP  . Ventricular tachycardia ([facility])    s/p ICD Implantation (MDT Secura VR)    Family History  Problem Relation Age of Onset  . Other Mother        has chronic pain syndrome from DDD with spinal cord stimulator  . Hypertension Mother   . COPD Father   . Arthritis/Rheumatoid Father     Past Surgical History:  Procedure Laterality Date  . CARDIAC CATHETERIZATION N/A 04/11/2016   Procedure: Right/Left Heart Cath and Coronary Angiography;  Surgeon: Larey Dresser, MD;  Location: [facility];  Service: Cardiovascular;  Laterality: N/A;  . CARDIAC DEFIBRILLATOR PLACEMENT  08/12/2000, 11/11/2008   Most recent Generator MDT Secura VR placed in  [place]  . CESAREAN SECTION  1996  . CHOLECYSTECTOMY  2004  . GASTRIC BYPASS  05/2017  . ICD GENERATOR CHANGEOUT  03/02/2018  . ICD GENERATOR CHANGEOUT N/A 03/02/2018   Procedure: ICD GENERATOR CHANGEOUT;  Surgeon: Evans Lance, MD;  Location: [facility];  Service: Cardiovascular;  Laterality: N/A;  . IR GENERIC HISTORICAL  06/13/2014   IR RADIOLOGIST EVAL & MGMT 06/13/2014 Greggory Keen, MD [facility]  . IR GENERIC HISTORICAL  04/09/2016   IR FLUORO GUIDE CV LINE LEFT 04/09/2016 Greggory Keen, MD [facility]  . IR GENERIC HISTORICAL  04/09/2016   IR US GUIDE VASC ACCESS RIGHT 04/09/2016 Greggory Keen, MD [facility]  . IR GENERIC HISTORICAL  04/09/2016   IR VENO/EXT/UNI RIGHT 04/09/2016 Greggory Keen, MD [facility]  . IR GENERIC HISTORICAL  04/09/2016   IR US GUIDE VASC ACCESS LEFT 04/09/2016 Greggory Keen, MD [facility]  . LEAD REVISION/REPAIR N/A 03/02/2018   Procedure: LEAD REVISION/REPAIR;  Surgeon: Evans Lance,  MD;  Location: [facility];  Service: Cardiovascular;  Laterality: N/A;  . TEE WITHOUT CARDIOVERSION N/A 04/08/2016   Procedure: TRANSESOPHAGEAL ECHOCARDIOGRAM (TEE);  Surgeon: Pixie Casino, MD;  Location: [facility];  Service: Cardiovascular;  Laterality: N/A;   Social History   Occupational History  . Not on file  Tobacco Use  . Smoking status: Never Smoker  . Smokeless tobacco: Never Used  Substance and Sexual Activity  . Alcohol use: No    Alcohol/week: 0.0 standard drinks  . Drug use: No  . Sexual activity: Not on file

## 2018-11-23 ENCOUNTER — Ambulatory Visit (INDEPENDENT_AMBULATORY_CARE_PROVIDER_SITE_OTHER): Payer: Medicare Other

## 2018-11-23 DIAGNOSIS — I5022 Chronic systolic (congestive) heart failure: Secondary | ICD-10-CM | POA: Diagnosis not present

## 2018-11-23 DIAGNOSIS — Z95810 Presence of automatic (implantable) cardiac defibrillator: Secondary | ICD-10-CM | POA: Diagnosis not present

## 2018-11-23 NOTE — Progress Notes (Signed)
EPIC Encounter for ICM Monitoring  Patient Name: Erin Jarvis is a 53 y.o. female Date: 11/23/2018 Primary Care Physican: Patient, No Pcp Per Primary Cardiologist:Hochrein/[facility] Electrophysiologist: Allred Nephrologist: Dr Hollie Salk at [facility] with patient and hands are swollen.  Legs have been swelling due gout and a knee injury.  She said Dr Aundra Dubin stopped Torsemide due to kidney worsening kidney function.  OptiVolThoracic impedance suggesting possible ongoing fluid accumulation since 11/01/2018 and > fluid index since 8/15.  Prescribed:Torsemide stopped 10/11/2018 due to 7/10 lab results.  Labs: 10/18/2018 Creatinine 1.55, BUN 13, Potassium 3.8, Sodium 133, GFR 37-43 10/08/2018 Creatinine 2.39, BUN 31, Potassium 4.0, Sodium 135, GFR 22-26  05/28/2018 Creatinine 1.70, BUN 22, Potassium 3.6, Sodium 139, GFR 33-39  05/18/2018 Creatinine1.88, BUN24, Potassium3.8, Sodium140, [id number]  A complete set of results can be found in Results Review.  Recommendations: Advised will send copy to Dr Aundra Dubin for review and call back with any recommendations.  Follow-up plan: ICM clinic phone appointment on8/28/2020 (manual send) to recheck fluid levels.   Copy of ICM check sent to Dr.Allred and Dr Aundra Dubin.  3 month ICM trend: 11/23/2018    1 Year ICM trend:       [name], RN 11/23/2018 2:39 PM

## 2018-11-24 ENCOUNTER — Telehealth: Payer: Self-pay | Admitting: Orthopedic Surgery

## 2018-11-24 NOTE — Progress Notes (Signed)
Spoke with patient.  She said the swelling decreased in hands but feels fine.  She has been eating beef jerky and cheese for protein.  Discussed the effects of high sodium diet and both the jerky and cheese are very high in salt.  She has access to dietician and advised to make appointment to discuss a heart healthy diet for her.  Will recheck fluid levels on 8/28.

## 2018-11-24 NOTE — Telephone Encounter (Signed)
FYI

## 2018-11-24 NOTE — Telephone Encounter (Signed)
Patient is scheduled for right knee arthroscopy 11-30-18 at [facility].  Patient has an appointment tomorrow 11-25-18 at [facility] [place] for patient instructions for Lovenox bridge was faxed to Dr. Loralie Champagne this morning.

## 2018-11-24 NOTE — Telephone Encounter (Signed)
thx

## 2018-11-25 ENCOUNTER — Other Ambulatory Visit: Payer: Self-pay

## 2018-11-25 ENCOUNTER — Ambulatory Visit (HOSPITAL_BASED_OUTPATIENT_CLINIC_OR_DEPARTMENT_OTHER)
Admission: RE | Admit: 2018-11-25 | Discharge: 2018-11-25 | Disposition: A | Discharge status: Home / Self Care | Payer: Medicare Other | Source: Ambulatory Visit | Attending: Cardiology | Admitting: Cardiology

## 2018-11-25 ENCOUNTER — Ambulatory Visit (HOSPITAL_COMMUNITY)
Admission: RE | Admit: 2018-11-25 | Discharge: 2018-11-25 | Disposition: A | Discharge status: Home / Self Care | Payer: Medicare Other | Source: Ambulatory Visit | Attending: Cardiology | Admitting: Cardiology

## 2018-11-25 ENCOUNTER — Encounter: Payer: Self-pay | Admitting: Orthopedic Surgery

## 2018-11-25 ENCOUNTER — Other Ambulatory Visit (HOSPITAL_COMMUNITY): Payer: Self-pay

## 2018-11-25 DIAGNOSIS — G4730 Sleep apnea, unspecified: Secondary | ICD-10-CM | POA: Diagnosis not present

## 2018-11-25 DIAGNOSIS — E119 Type 2 diabetes mellitus without complications: Secondary | ICD-10-CM | POA: Insufficient documentation

## 2018-11-25 DIAGNOSIS — I5022 Chronic systolic (congestive) heart failure: Secondary | ICD-10-CM | POA: Insufficient documentation

## 2018-11-25 DIAGNOSIS — I4891 Unspecified atrial fibrillation: Secondary | ICD-10-CM | POA: Diagnosis not present

## 2018-11-25 DIAGNOSIS — E785 Hyperlipidemia, unspecified: Secondary | ICD-10-CM | POA: Diagnosis not present

## 2018-11-25 DIAGNOSIS — I110 Hypertensive heart disease with heart failure: Secondary | ICD-10-CM | POA: Diagnosis not present

## 2018-11-25 LAB — TSH: TSH: 15.704 u[IU]/mL — ABNORMAL HIGH (ref 0.350–4.500)

## 2018-11-25 NOTE — Progress Notes (Signed)
  Echocardiogram 2D Echocardiogram has been performed.  Darlina Sicilian M 11/25/2018, 11:27 AM

## 2018-11-26 ENCOUNTER — Encounter (HOSPITAL_COMMUNITY): Payer: Self-pay

## 2018-11-26 ENCOUNTER — Telehealth: Payer: Self-pay

## 2018-11-26 ENCOUNTER — Telehealth (HOSPITAL_COMMUNITY): Payer: Self-pay

## 2018-11-26 ENCOUNTER — Other Ambulatory Visit: Payer: Self-pay

## 2018-11-26 ENCOUNTER — Other Ambulatory Visit (HOSPITAL_COMMUNITY)
Admission: RE | Admit: 2018-11-26 | Discharge: 2018-11-26 | Disposition: A | Discharge status: Home / Self Care | Payer: Medicare Other | Source: Ambulatory Visit | Attending: Orthopedic Surgery | Admitting: Orthopedic Surgery

## 2018-11-26 ENCOUNTER — Ambulatory Visit (INDEPENDENT_AMBULATORY_CARE_PROVIDER_SITE_OTHER): Payer: Medicare Other

## 2018-11-26 ENCOUNTER — Encounter (HOSPITAL_COMMUNITY)
Admission: RE | Admit: 2018-11-26 | Discharge: 2018-11-26 | Disposition: A | Discharge status: Home / Self Care | Payer: Medicare Other | Source: Ambulatory Visit | Attending: Orthopedic Surgery | Admitting: Orthopedic Surgery

## 2018-11-26 DIAGNOSIS — E1122 Type 2 diabetes mellitus with diabetic chronic kidney disease: Secondary | ICD-10-CM | POA: Diagnosis not present

## 2018-11-26 DIAGNOSIS — M2391 Unspecified internal derangement of right knee: Secondary | ICD-10-CM | POA: Insufficient documentation

## 2018-11-26 DIAGNOSIS — E039 Hypothyroidism, unspecified: Secondary | ICD-10-CM | POA: Insufficient documentation

## 2018-11-26 DIAGNOSIS — I130 Hypertensive heart and chronic kidney disease with heart failure and stage 1 through stage 4 chronic kidney disease, or unspecified chronic kidney disease: Secondary | ICD-10-CM | POA: Insufficient documentation

## 2018-11-26 DIAGNOSIS — N189 Chronic kidney disease, unspecified: Secondary | ICD-10-CM | POA: Diagnosis not present

## 2018-11-26 DIAGNOSIS — Z95810 Presence of automatic (implantable) cardiac defibrillator: Secondary | ICD-10-CM

## 2018-11-26 DIAGNOSIS — Z79899 Other long term (current) drug therapy: Secondary | ICD-10-CM | POA: Insufficient documentation

## 2018-11-26 DIAGNOSIS — G4730 Sleep apnea, unspecified: Secondary | ICD-10-CM | POA: Diagnosis not present

## 2018-11-26 DIAGNOSIS — Z20828 Contact with and (suspected) exposure to other viral communicable diseases: Secondary | ICD-10-CM | POA: Diagnosis not present

## 2018-11-26 DIAGNOSIS — Z7901 Long term (current) use of anticoagulants: Secondary | ICD-10-CM | POA: Diagnosis not present

## 2018-11-26 DIAGNOSIS — I509 Heart failure, unspecified: Secondary | ICD-10-CM | POA: Insufficient documentation

## 2018-11-26 DIAGNOSIS — I5022 Chronic systolic (congestive) heart failure: Secondary | ICD-10-CM

## 2018-11-26 DIAGNOSIS — Z9884 Bariatric surgery status: Secondary | ICD-10-CM | POA: Insufficient documentation

## 2018-11-26 DIAGNOSIS — Z01812 Encounter for preprocedural laboratory examination: Secondary | ICD-10-CM | POA: Insufficient documentation

## 2018-11-26 HISTORY — DX: Chronic kidney disease, unspecified: N18.9

## 2018-11-26 HISTORY — DX: Depression, unspecified: F32.A

## 2018-11-26 HISTORY — DX: Acute embolism and thrombosis of unspecified veins of right upper extremity: I82.601

## 2018-11-26 HISTORY — DX: Unspecified osteoarthritis, unspecified site: M19.90

## 2018-11-26 HISTORY — DX: Pneumonia, unspecified organism: J18.9

## 2018-11-26 HISTORY — DX: Hypothyroidism, unspecified: E03.9

## 2018-11-26 LAB — BASIC METABOLIC PANEL
Anion gap: 9 (ref 5–15)
BUN: 12 mg/dL (ref 6–20)
CO2: 20 mmol/L — ABNORMAL LOW (ref 22–32)
Calcium: 8.9 mg/dL (ref 8.9–10.3)
Chloride: 113 mmol/L — ABNORMAL HIGH (ref 98–111)
Creatinine, Ser: 1.02 mg/dL — ABNORMAL HIGH (ref 0.44–1.00)
GFR calc Af Amer: >60 mL/min (ref >60)
GFR calc non Af Amer: >60 mL/min (ref >60)
Glucose, Bld: 65 mg/dL — ABNORMAL LOW (ref 70–99)
Potassium: 3.7 mmol/L (ref 3.5–5.1)
Sodium: 142 mmol/L (ref 135–145)

## 2018-11-26 LAB — HEMOGLOBIN A1C
Hgb A1c MFr Bld: 4.9 % (ref 4.8–5.6)
Mean Plasma Glucose: 93.93 mg/dL

## 2018-11-26 LAB — CBC
HCT: 38.1 % (ref 36.0–46.0)
Hemoglobin: 11.8 g/dL — ABNORMAL LOW (ref 12.0–15.0)
MCH: 27.8 pg (ref 26.0–34.0)
MCHC: 31 g/dL (ref 30.0–36.0)
MCV: 89.6 fL (ref 80.0–100.0)
Platelets: 169 10*3/uL (ref 150–400)
RBC: 4.25 MIL/uL (ref 3.87–5.11)
RDW: 14.6 % (ref 11.5–15.5)
WBC: 5.7 10*3/uL (ref 4.0–10.5)
nRBC: 0 % (ref 0.0–0.2)

## 2018-11-26 MED ORDER — TORSEMIDE 20 MG PO TABS: 20.0000 mg | ORAL_TABLET | Freq: Every day | ORAL | 2 refills | Status: DC

## 2018-11-26 MED ORDER — LEVOTHYROXINE SODIUM 25 MCG PO TABS: 25.0000 ug | ORAL_TABLET | Freq: Every day | ORAL | 6 refills | Status: DC

## 2018-11-26 NOTE — Telephone Encounter (Signed)
Call to patient.  Advised Dr Aundra Dubin ordered her to start Torsemide 20 mg 1 tablet daily.  She will need a BMET drawn in a week and she agreed to 10:00 AM appointment on 12/03/2018.  Patient has supply on hand for Torsemide since it was just discontinued last month. She will call when ready for new script to be filled.

## 2018-11-26 NOTE — Progress Notes (Signed)
Larey Dresser, MD  Physician  Specialty:  Cardiology  Telephone Encounter  Signed  Creation Time:  11/26/2018 3:42 PM          Signed        She needs to start torsemide 20 mg daily.  BMET in 7 days.

## 2018-11-26 NOTE — Progress Notes (Signed)
PCP -  Cardiologist - Dr. Aundra Dubin, Dr. Rayann Heman  Pt has an ICD, form faxed to the [facility].  Chest x-ray - 03/03/18 EKG - 10/08/18 Stress Test - denies ECHO - 11/25/18 Cardiac Cath - 04/11/16  Sleep Study - years ago CPAP - yes  Fasting Blood Sugar - doesn't check except if she feels shaky Checks Blood Sugar _____ times a day  Blood Thinner Instructions: No instructions yet about Eliquis, instructed pt to call Dr. Claris Gladden office today to ask when to stop it.  Aspirin Instructions: N/A  Anesthesia review: Yes, heart hx, increased TSH level  Patient denies shortness of breath, fever, cough and chest pain at PAT appointment   Patient verbalized understanding of instructions that were given to them at the PAT appointment. Patient was also instructed that they will need to review over the PAT instructions again at home before surgery.  Pt will have her Covid test done today after this appt. Pt made aware of need for quarantine and voices understanding.  Pt was exposed to a asymptomatic positive Covid person over 2 weeks. She states she has had no symptoms.   Coronavirus Screening  Have you experienced the following symptoms:  Cough yes/no: No Fever (>100.26F)  yes/no: No Runny nose yes/no: No Sore throat yes/no: No Difficulty breathing/shortness of breath  yes/no: No  Have you or a family member traveled in the last 14 days and where? yes/no: No    Patient reminded that hospital visitation restrictions are in effect and the importance of the restrictions. Informed pt that she may have 1 visitor wait in the waiting room while she is in pre-op, surgery and PACU. Pt voiced understanding.

## 2018-11-26 NOTE — Pre-Procedure Instructions (Signed)
Erin Jarvis  11/26/2018    Your procedure is scheduled on Tuesday, November 30, 2018 at 3:45 PM.   Report to [facility] Entrance "A" Admitting Office at 1:45 PM.   Call this number if you have problems the morning of surgery: [phone]   Questions prior to day of surgery, please call [phone] between 8 & 4 PM.   Remember:  Do not eat food after midnight Monday 11/29/18.  You may drink clear liquids until 12:45 PM.  Clear liquids allowed are: Water, Juice (non-citric and without pulp), Carbonated beverages, Clear Tea, Black Coffee only, Plain Jell-O only and Gatorade.  Drink Gatorade G2 between 12:30 and 12:45 PM day of surgery.    Take these medicines the morning of surgery: Citalopram (Celexa), Metoprolol (Toprol) Pantoprazole (Protonix)  Stop Eliquis as instructed by cardiologist. Stop Multivitamins as of today prior to surgery. Do not use Aspirin products (BC Powders, Goody's, etc), NSAIDS (Ibuprofen, Aleve, etc) or Herbal medications prior to surgery.   How to Manage Your Diabetes Before Surgery   Why is it important to control my blood sugar before and after surgery?   Improving blood sugar levels before and after surgery helps healing and can limit problems.  A way of improving blood sugar control is eating a healthy diet by:  - Eating less sugar and carbohydrates  - Increasing activity/exercise  - Talk with your doctor about reaching your blood sugar goals  High blood sugars (greater than 180 mg/dL) can raise your risk of infections and slow down your recovery so you will need to focus on controlling your diabetes during the weeks before surgery.  Make sure that the doctor who takes care of your diabetes knows about your planned surgery including the date and location.  How do I manage my blood sugars before surgery?   Check your blood sugar at least 4 times a day, 2 days before surgery to make sure that they are not too high or low.  Check your  blood sugar the morning of your surgery when you wake up and every 2 hours until you get to the Short-Stay unit.  Treat a low blood sugar (less than 70 mg/dL) with 1/2 cup of clear juice (cranberry or apple), 4 glucose tablets, OR glucose gel.  Recheck blood sugar in 15 minutes after treatment (to make sure it is greater than 70 mg/dL).  If blood sugar is not greater than 70 mg/dL on re-check, call [phone] for further instructions.   Report your blood sugar to the Short-Stay nurse when you get to Short-Stay.  References:  [facility], 2007 "How to Manage your Diabetes Before and After Surgery".    Do not wear jewelry, make-up or nail polish.  Do not wear lotions, powders, perfumes or deodorant.  Do not shave 48 hours prior to surgery.    Do not bring valuables to the hospital.  [facility] is not responsible for any belongings or valuables.  Contacts, dentures or bridgework may not be worn into surgery.  Leave your suitcase in the car.  After surgery it may be brought to your room.  For patients admitted to the hospital, discharge time will be determined by your treatment team.  Patients discharged the day of surgery will not be allowed to drive home.    - Preparing for Surgery  Before surgery, you can play an important role.  Because skin is not sterile, your skin needs to be as free of germs  as possible.  You can reduce the number of germs on you skin by washing with CHG (chlorahexidine gluconate) soap before surgery.  CHG is an antiseptic cleaner which kills germs and bonds with the skin to continue killing germs even after washing.  Oral Hygiene is also important in reducing the risk of infection.  Remember to brush your teeth with your regular toothpaste the morning of surgery.  Please DO NOT use if you have an allergy to CHG or antibacterial soaps.  If your skin becomes reddened/irritated stop using the CHG and inform your nurse when you  arrive at Short Stay.  Do not shave (including legs and underarms) for at least 48 hours prior to the first CHG shower.  You may shave your face.  Please follow these instructions carefully:   1.  Shower with CHG Soap the night before surgery and the morning of Surgery.  2.  If you choose to wash your hair, wash your hair first as usual with your normal shampoo.  3.  After you shampoo, rinse your hair and body thoroughly to remove the shampoo. 4.  Use CHG as you would any other liquid soap.  You can apply chg directly to the skin and wash gently with a      scrungie or washcloth.           5.  Apply the CHG Soap to your body ONLY FROM THE NECK DOWN.   Do not use on open wounds or open sores. Avoid contact with your eyes, ears, mouth and genitals (private parts).  Wash genitals (private parts) with your normal soap - do this prior to using CHG soap.  6.  Wash thoroughly, paying special attention to the area where your surgery will be performed.  7.  Thoroughly rinse your body with warm water from the neck down.  8.  DO NOT shower/wash with your normal soap after using and rinsing off the CHG Soap.  9.  Pat yourself dry with a clean towel.            10.  Wear clean pajamas.            11.  Place clean sheets on your bed the night of your first shower and do not sleep with pets.  Day of Surgery  Shower as above. Do not apply any lotions/deodorants the morning of surgery.   Please wear clean clothes to the hospital. Remember to brush your teeth with toothpaste.   Please read over the fact sheets that you were given.

## 2018-11-26 NOTE — Progress Notes (Signed)
Call to patient.  Advised Dr Aundra Dubin ordered her to start Torsemide 20 mg 1 tablet daily.  She will need a BMET drawn in a week and she agreed to 10:00 AM appointment on 12/03/2018.  Patient has supply on hand for Torsemide since it was just discontinued last month. She will call when ready for new script to be filled.  See ICM phone note for Torsemide and BMET order.

## 2018-11-26 NOTE — Progress Notes (Signed)
EPIC Encounter for ICM Monitoring  Patient Name: Erin Jarvis is a 53 y.o. female Date: 11/26/2018 Primary Care Physican: Patient, No Pcp Per Primary Cardiologist:Hochrein/[facility] Electrophysiologist: Allred Nephrologist: Dr Hollie Salk at [place]     Attempted call to patient and unable to reach.  Left message to return call. Transmission reviewed.   OptiVolThoracic impedance suggesting possible ongoing fluid accumulation since 11/01/2018 and > fluid index since 8/15.  Prescribed:Torsemide stopped 10/11/2018 due to 7/10 lab results.  Labs: 10/18/2018 Creatinine1.55, Satira Mccallum [id number], [id number] 10/08/2018 Creatinine2.39, BUN31, Potassium4.0, Sodium135, [id number]  05/28/2018 Creatinine1.70, Doneta Public, Potassium3.6, [id number], [id number] 05/18/2018 Creatinine1.88, BUN24, Potassium3.8, Sodium140, [id number]  A complete set of results can be found in Results Review.  Recommendations: Phone note sent to Dr Aundra Dubin for review and recommendations if needed.   Follow-up plan: ICM clinic phone appointment on9/05/2018 to recheck fluid levels.   Copy of ICM check sent to [facility] and phone note sent to Dr Aundra Dubin.  3 month ICM trend: 11/25/2018    1 Year ICM trend:       [name], RN 11/26/2018 2:14 PM

## 2018-11-26 NOTE — Telephone Encounter (Signed)
Pt aware of results. Will start levoxyl 39mcg daily. appt card mailed for 1 month lab. Verbalized understanding.

## 2018-11-26 NOTE — Telephone Encounter (Signed)
Attempted ICM call to patient and left message for return call.  Previously spoke with patient on 8/26 and she reported swelling of hands. She reports having gout in her knees and also an injury to her one of the knees.  She is unable to tell what which condition is causing swelling of knees.     DIET:  Previously reported she has been eating beef jerky and cheese for protein.  Discussed the effects of high sodium diet and both the jerky and cheese are very high in salt.  She has access to dietician and advised to make appointment to discuss a heart healthy diet for her.  OptiVolThoracic impedance suggesting possible ongoing fluid accumulation since 11/01/2018 and > fluid index since 8/15.  Torsemide stopped 10/11/2018 due to 7/10 lab results.  Labs: 10/18/2018 Creatinine1.55, Satira Mccallum [id number], [id number] 10/08/2018 Creatinine2.39, BUN31, Potassium4.0, [id number], [id number]   Sent to Dr Aundra Dubin for review and attempt call back with any recommendations.   Optivol 3 month trend:   1 year trend:

## 2018-11-26 NOTE — Telephone Encounter (Signed)
She needs to start torsemide 20 mg daily.  BMET in 7 days.

## 2018-11-26 NOTE — Telephone Encounter (Signed)
-----   Message from Larey Dresser, MD sent at 11/26/2018  3:52 PM EDT ----- Restart levoxyl at 25 mcg daily with TSH in 1 month.

## 2018-11-27 LAB — SARS CORONAVIRUS 2 (TAT 6-24 HRS): SARS Coronavirus 2: NEGATIVE

## 2018-11-29 ENCOUNTER — Telehealth (HOSPITAL_COMMUNITY): Payer: Self-pay | Admitting: Cardiology

## 2018-11-29 ENCOUNTER — Encounter (HOSPITAL_COMMUNITY): Payer: Self-pay

## 2018-11-29 NOTE — Telephone Encounter (Signed)
Patient called with concerns about eliquis and upcoming orthopedic procedure Per Dr Aundra Dubin, pt will need to hold eliquis x 2 days prior to procedure Pt aware 8/28 and voiced understanding

## 2018-11-29 NOTE — Progress Notes (Signed)
Anesthesia Chart Review:  Case: [id number] Date/Time: 11/30/18 1530   Procedure: right knee arthroscopy with debridement (Right Knee)   Anesthesia type: General   Pre-op diagnosis: right locked knee   Location: [facility] OR ROOM 06 / [place] [facility]   Surgeon: Meredith Pel, MD      DISCUSSION: Patient is a 53 year old female scheduled for the above procedure.  History includes nonischemic cardiomyopathy (possible peripartum CM, diagnosed 1996), VT (s/p Medtronic ICD 2002; s/p generator change, RV lead revision, relocation of packet to subpectoral location [id number]), chronic systolic CHF, afib/PAF (diagnosed 02/2016), CKD (stage III with atropic "left" kidney), hypothyroidism, OSA (CPAP), DM2 (in remission since bariatric surgery), HTN, gastric bypass (06/16/17), RUE "blood clot" (history of).  Last seen by Dr. Aundra Dubin 10/08/18 but had follow-up echo on 11/25/18 that showed stable EF of 40-45%. I spoke with Jackelyn Poling at Dr. Randel Pigg office on 11/29/18. She reported that patient stated that Dr. Aundra Dubin is aware of surgery plans and had instructed her to temporarily hold Eliquis after 11/27/18 PM dose. I confirmed this with [place] at Dr. Claris Gladden office.    On 11/26/18, levothyroxine resumed due to elevated TSH and torsemide resumed at 20 mg daily due to OptiVol Thoracic impedance reading suggesting possible ongoing fluid accumulation since 11/01/18. Torsemide had been held due to elevated Creatinine (peak 2.39 10/08/18). She is for follow-up BMET on 12/03/18.  She denied SOB, cough, fever, and chest pain at PAT RN visit. 11/26/18 presurgical COVID-19 test negative.  Based on currently available information, I would anticipate that she can proceed as planned. As above, reportedly last Eliqus 11/27/18 PM. Case is posted for general.   Still awaiting ICD preoperative RX from [facility].    VS: BP 96/67   Pulse 78   Temp (!) 36.4 C   Resp 20   Ht 5\' 6"  (1.676 m)   Wt 85.6 kg   LMP 03/14/2014 Comment: neg preg test   SpO2 100%   BMI 30.47 kg/m   PROVIDERS: Patient, No Pcp Per (Cardiology lists prior PCP as Stoney Bang, MD) Loralie Champagne, MD is HF cardiologist Minus Breeding, MD is primary cardiologist Thompson Grayer, MD is EP cardiologist Madelon Lips, MD is nephrologist   LABS: Labs reviewed: Acceptable for surgery. Cr 1.02, down from 1.55 on 10/18/18. Levothyroxine held last month due to decreased TSH, but follow-up TSH on 11/25/18 now elevated at 15.704, so she low dose levothyroxine resumed at 25 mcg daily. (all labs ordered are listed, but only abnormal results are displayed)  Labs Reviewed  BASIC METABOLIC PANEL - Abnormal; Notable for the following components:      Result Value   Chloride 113 (*)    CO2 20 (*)    Glucose, Bld 65 (*)    Creatinine, Ser 1.02 (*)    All other components within normal limits  CBC - Abnormal; Notable for the following components:   Hemoglobin 11.8 (*)    All other components within normal limits  SARS CORONAVIRUS 2 (TAT 6-12 HRS)  HEMOGLOBIN A1C     IMAGES: CXR 03/03/18: FINDINGS: New ICD generator and lead are in place. The lead tip is in the right ventricle. Lead from prior generator is also in place and unchanged. There is no pneumothorax. Heart size is upper normal. Lungs are clear. No pleural effusion. No acute or focal bony abnormality. IMPRESSION: Status post ICD generator and lead change without evidence of complication. No pneumothorax.   EKG: 10/08/18: Normal sinus rhythm Low voltage QRS Cannot rule  out Anterior infarct , age undetermined Abnormal ECG Confirmed by Dorris Carnes [phone]) on 10/08/2018 10:01:56 PM   CV: Echo 11/25/18: IMPRESSIONS  1. The average left ventricular global longitudinal strain is -10.9 %.  2. The left ventricle has mild-moderately reduced systolic function, with an ejection fraction of 40-45%. The cavity size was normal. Left ventricular diastolic Doppler parameters are consistent with impaired  relaxation. Elevated left ventricular  end-diastolic pressure Left ventricular diffuse hypokinesis.  3. The right ventricle has normal systolic function. The cavity was normal. There is no increase in right ventricular wall thickness.  4. The aorta is normal unless otherwise noted. (Comparison: 11/27/17 LVEF 40-45%, mild diffuse hypokinesis; 04/09/17 LVEF 25-30%, akinesis of the anteroseptal and inferoseptal myocardium)   RHC/LHC 04/11/16: Hemodynamics (mmHg) RA mean 19 RV 45/16 PA 46/20, mean 35 PCWP mean 24 LV 108/21 AO 111/75  Oxygen saturations: PA 66% AO 96%  Cardiac Output (Fick) 7.56  Cardiac Index (Fick) 3.16 PVR 1.4 WU  Cardiac Output (Thermo) 5.66 Cardiac Index (Thermo) 2.37  PVR 1.9 WU  Conclusions: 1. No angiographic CAD.  2. Preserved cardiac output.  3. Elevated filling pressures, especially on right side.    Past Medical History:  Diagnosis Date  . AICD (automatic cardioverter/defibrillator) present 2002, 2010   secondary to post-partum cardiomyopathy (1996)  . Arm vein blood clot, right   . Arthritis   . CHF (congestive heart failure) ([place])   . Chronic kidney disease    only 1 kidney - left kidney is the size of a thumb  . Depression   . Diabetes mellitus without complication ([place])    no longer on meds since weight loss  . Hypertension   . Hypertensive cardiovascular disease   . Hypothyroidism    was taken off of Synthroid in July 2020 due to labs being hyperthyroid  . Obesity   . Persistent atrial fibrillation   . Pneumonia   . Postpartum cardiomyopathy    Last echocardiogram was last year and EF 45 -50 %  . Seasonal allergies   . Sleep apnea    complaint with CPAP  . Ventricular tachycardia (HCC)    s/p ICD Implantation (MDT Secura VR)    Past Surgical History:  Procedure Laterality Date  . CARDIAC CATHETERIZATION N/A 04/11/2016   Procedure: Right/Left Heart Cath and Coronary Angiography;  Surgeon: Larey Dresser, MD;  Location: [place] [facility];  Service: Cardiovascular;  Laterality: N/A;  . CARDIAC DEFIBRILLATOR PLACEMENT  08/12/2000, 11/11/2008   Most recent Generator MDT Secura VR placed in [place]  . CESAREAN SECTION  1996  . CHOLECYSTECTOMY  2004  . GASTRIC BYPASS  05/2017  . ICD GENERATOR CHANGEOUT  03/02/2018  . ICD GENERATOR CHANGEOUT N/A 03/02/2018   Procedure: ICD GENERATOR CHANGEOUT;  Surgeon: Evans Lance, MD;  Location: [place] [facility] LAB;  Service: Cardiovascular;  Laterality: N/A;  . IR GENERIC HISTORICAL  06/13/2014   IR RADIOLOGIST EVAL & MGMT 06/13/2014 Greggory Keen, MD [facility] INTERV RAD  . IR GENERIC HISTORICAL  04/09/2016   IR FLUORO GUIDE CV LINE LEFT 04/09/2016 Greggory Keen, MD [facility]  . IR GENERIC HISTORICAL  04/09/2016   IR US GUIDE VASC ACCESS RIGHT 04/09/2016 Greggory Keen, MD [facility]  . IR GENERIC HISTORICAL  04/09/2016   IR VENO/EXT/UNI RIGHT 04/09/2016 Greggory Keen, MD [facility]  . IR GENERIC HISTORICAL  04/09/2016   IR US GUIDE VASC ACCESS LEFT 04/09/2016 Greggory Keen, MD [facility]  . LEAD REVISION/REPAIR N/A  03/02/2018   Procedure: LEAD REVISION/REPAIR;  Surgeon: Evans Lance, MD;  Location: [place] [facility] LAB;  Service: Cardiovascular;  Laterality: N/A;  . TEE WITHOUT CARDIOVERSION N/A 04/08/2016   Procedure: TRANSESOPHAGEAL ECHOCARDIOGRAM (TEE);  Surgeon: Pixie Casino, MD;  Location: [facility] ENDOSCOPY;  Service: Cardiovascular;  Laterality: N/A;    MEDICATIONS: . apixaban (ELIQUIS) 5 MG TABS tablet  . Calcium Citrate-Vitamin D (CALCIUM CITRATE + D) 250-200 MG-UNIT TABS  . citalopram (CELEXA) 10 MG tablet  . diphenhydramine-acetaminophen (TYLENOL PM) 25-500 MG TABS tablet  . ferrous sulfate 325 (65 FE) MG tablet  . levothyroxine (SYNTHROID) 25 MCG tablet  . metoprolol succinate (TOPROL-XL) 100 MG 24 hr tablet  . Multiple Vitamin (MULTIVITAMIN) tablet  . pantoprazole (PROTONIX) 40 MG tablet  . sacubitril-valsartan (ENTRESTO) 24-26 MG  . spironolactone  (ALDACTONE) 25 MG tablet  . torsemide (DEMADEX) 20 MG tablet  . vitamin B-12 (CYANOCOBALAMIN) 1000 MCG tablet   No current facility-administered medications for this encounter.     Myra Gianotti, PA-C Surgical Short Stay/Anesthesiology [facility] Phone [phone] [facility] Phone [phone] 11/29/2018 11:42 AM

## 2018-11-29 NOTE — Anesthesia Preprocedure Evaluation (Addendum)
Anesthesia Evaluation  Patient identified by MRN, date of birth, ID band Patient awake    Reviewed: Allergy & Precautions, NPO status , Patient's Chart, lab work & pertinent test results, reviewed documented beta blocker date and time   History of Anesthesia Complications Negative for: history of anesthetic complications  Airway Mallampati: I  TM Distance: >3 FB Neck ROM: Full    Dental  (+) Dental Advisory Given, Chipped,    Pulmonary sleep apnea and Continuous Positive Airway Pressure Ventilation ,    breath sounds clear to auscultation       Cardiovascular hypertension, Pt. on medications and Pt. on home beta blockers +CHF  + dysrhythmias (on Eliquis) Atrial Fibrillation + Cardiac Defibrillator  Rhythm:Regular Rate:Normal  Hx of VT s/p AICD placement  TTE 11/25/18: EF 40-45%, impaired relaxation, left ventricular diffuse hypokinesis    Neuro/Psych Depression negative neurological ROS     GI/Hepatic Neg liver ROS, GERD  Medicated and Controlled,  Endo/Other  diabetes, Well Controlled, Type 2Hypothyroidism   Renal/GU Renal diseaseSolitary kidney     Musculoskeletal  (+) Arthritis ,   Abdominal Normal abdominal exam  (+)   Peds  Hematology negative hematology ROS (+)   Anesthesia Other Findings Day of surgery medications reviewed with the patient.  Reproductive/Obstetrics                          Anesthesia Physical Anesthesia Plan  ASA: III  Anesthesia Plan: General   Post-op Pain Management:    Induction: Intravenous  PONV Risk Score and Plan: 4 or greater and Treatment may vary due to age or medical condition, Ondansetron, Dexamethasone and Midazolam  Airway Management Planned: LMA  Additional Equipment: None  Intra-op Plan:   Post-operative Plan: Extubation in OR  Informed Consent: I have reviewed the patients History and Physical, chart, labs and discussed the procedure  including the risks, benefits and alternatives for the proposed anesthesia with the patient or authorized representative who has indicated his/her understanding and acceptance.     Dental advisory given  Plan Discussed with: CRNA  Anesthesia Plan Comments: (PAT note written 11/29/2018 by Myra Gianotti, PA-C. )    Anesthesia Quick Evaluation

## 2018-11-30 ENCOUNTER — Other Ambulatory Visit: Payer: Self-pay

## 2018-11-30 ENCOUNTER — Ambulatory Visit (HOSPITAL_COMMUNITY)
Admission: RE | Admit: 2018-11-30 | Discharge: 2018-11-30 | Disposition: A | Discharge status: Home / Self Care | Payer: Medicare Other | Attending: Orthopedic Surgery | Admitting: Orthopedic Surgery

## 2018-11-30 ENCOUNTER — Ambulatory Visit (HOSPITAL_COMMUNITY): Payer: Medicare Other | Admitting: Vascular Surgery

## 2018-11-30 ENCOUNTER — Ambulatory Visit (HOSPITAL_COMMUNITY): Payer: Medicare Other | Admitting: Certified Registered Nurse Anesthetist

## 2018-11-30 ENCOUNTER — Encounter (HOSPITAL_COMMUNITY)
Admission: RE | Disposition: A | Discharge status: Home / Self Care | Payer: Self-pay | Source: Home / Self Care | Attending: Orthopedic Surgery

## 2018-11-30 ENCOUNTER — Encounter (HOSPITAL_COMMUNITY): Payer: Self-pay

## 2018-11-30 DIAGNOSIS — N189 Chronic kidney disease, unspecified: Secondary | ICD-10-CM | POA: Diagnosis not present

## 2018-11-30 DIAGNOSIS — M94261 Chondromalacia, right knee: Secondary | ICD-10-CM | POA: Insufficient documentation

## 2018-11-30 DIAGNOSIS — Z79899 Other long term (current) drug therapy: Secondary | ICD-10-CM | POA: Insufficient documentation

## 2018-11-30 DIAGNOSIS — I130 Hypertensive heart and chronic kidney disease with heart failure and stage 1 through stage 4 chronic kidney disease, or unspecified chronic kidney disease: Secondary | ICD-10-CM | POA: Diagnosis not present

## 2018-11-30 DIAGNOSIS — Z95810 Presence of automatic (implantable) cardiac defibrillator: Secondary | ICD-10-CM | POA: Diagnosis not present

## 2018-11-30 DIAGNOSIS — K219 Gastro-esophageal reflux disease without esophagitis: Secondary | ICD-10-CM | POA: Diagnosis not present

## 2018-11-30 DIAGNOSIS — Z9884 Bariatric surgery status: Secondary | ICD-10-CM | POA: Insufficient documentation

## 2018-11-30 DIAGNOSIS — G4730 Sleep apnea, unspecified: Secondary | ICD-10-CM | POA: Insufficient documentation

## 2018-11-30 DIAGNOSIS — I509 Heart failure, unspecified: Secondary | ICD-10-CM | POA: Insufficient documentation

## 2018-11-30 DIAGNOSIS — E039 Hypothyroidism, unspecified: Secondary | ICD-10-CM | POA: Diagnosis not present

## 2018-11-30 DIAGNOSIS — M2341 Loose body in knee, right knee: Secondary | ICD-10-CM

## 2018-11-30 DIAGNOSIS — S83271D Complex tear of lateral meniscus, current injury, right knee, subsequent encounter: Secondary | ICD-10-CM

## 2018-11-30 DIAGNOSIS — I4819 Other persistent atrial fibrillation: Secondary | ICD-10-CM | POA: Diagnosis not present

## 2018-11-30 DIAGNOSIS — M25561 Pain in right knee: Secondary | ICD-10-CM | POA: Diagnosis present

## 2018-11-30 DIAGNOSIS — Z79890 Hormone replacement therapy (postmenopausal): Secondary | ICD-10-CM | POA: Diagnosis not present

## 2018-11-30 DIAGNOSIS — X58XXXA Exposure to other specified factors, initial encounter: Secondary | ICD-10-CM | POA: Diagnosis not present

## 2018-11-30 DIAGNOSIS — M1711 Unilateral primary osteoarthritis, right knee: Secondary | ICD-10-CM | POA: Diagnosis not present

## 2018-11-30 DIAGNOSIS — Z7901 Long term (current) use of anticoagulants: Secondary | ICD-10-CM | POA: Diagnosis not present

## 2018-11-30 DIAGNOSIS — E1122 Type 2 diabetes mellitus with diabetic chronic kidney disease: Secondary | ICD-10-CM | POA: Insufficient documentation

## 2018-11-30 DIAGNOSIS — F329 Major depressive disorder, single episode, unspecified: Secondary | ICD-10-CM | POA: Diagnosis not present

## 2018-11-30 DIAGNOSIS — S83281A Other tear of lateral meniscus, current injury, right knee, initial encounter: Secondary | ICD-10-CM | POA: Diagnosis not present

## 2018-11-30 HISTORY — PX: KNEE ARTHROSCOPY: SHX127

## 2018-11-30 LAB — PROTIME-INR
INR: 0.9 (ref 0.8–1.2)
Prothrombin Time: 12.5 seconds (ref 11.4–15.2)

## 2018-11-30 LAB — GLUCOSE, CAPILLARY
Glucose-Capillary: 73 mg/dL (ref 70–99)
Glucose-Capillary: 88 mg/dL (ref 70–99)

## 2018-11-30 SURGERY — ARTHROSCOPY, KNEE
Anesthesia: General | Site: Knee | Laterality: Right

## 2018-11-30 MED ORDER — LACTATED RINGERS IV SOLN: INTRAVENOUS | Status: DC

## 2018-11-30 MED ORDER — LACTATED RINGERS IV SOLN
INTRAVENOUS | Status: DC
  Administered 2018-11-30: 15:00:00 via INTRAVENOUS

## 2018-11-30 MED ORDER — 0.9 % SODIUM CHLORIDE (POUR BTL) OPTIME
TOPICAL | Status: DC | PRN
  Administered 2018-11-30: 16:00:00 1000 mL

## 2018-11-30 MED ORDER — CEFAZOLIN SODIUM-DEXTROSE 2-4 GM/100ML-% IV SOLN
2.0000 g | INTRAVENOUS | Status: AC
  Administered 2018-11-30: 15:00:00 2 g via INTRAVENOUS
  Filled 2018-11-30: qty 100

## 2018-11-30 MED ORDER — ACETAMINOPHEN 325 MG PO TABS: 325.0000 mg | ORAL_TABLET | Freq: Once | ORAL | Status: DC | PRN

## 2018-11-30 MED ORDER — CHLORHEXIDINE GLUCONATE 4 % EX LIQD: 60.0000 mL | Freq: Once | CUTANEOUS | Status: DC

## 2018-11-30 MED ORDER — BUPIVACAINE-EPINEPHRINE 0.25% -1:200000 IJ SOLN
INTRAMUSCULAR | Status: DC | PRN
  Administered 2018-11-30: 20 mL
  Administered 2018-11-30: 30 mL

## 2018-11-30 MED ORDER — EPINEPHRINE PF 1 MG/ML IJ SOLN
INTRAMUSCULAR | Status: DC | PRN
  Administered 2018-11-30 (×2): 1 mg

## 2018-11-30 MED ORDER — METHOCARBAMOL 500 MG PO TABS: 500.0000 mg | ORAL_TABLET | Freq: Three times a day (TID) | ORAL | 0 refills | Status: DC | PRN

## 2018-11-30 MED ORDER — CLONIDINE HCL (ANALGESIA) 100 MCG/ML EP SOLN
EPIDURAL | Status: AC
  Filled 2018-11-30: qty 10

## 2018-11-30 MED ORDER — HYDROMORPHONE HCL 1 MG/ML IJ SOLN
INTRAMUSCULAR | Status: AC
  Administered 2018-11-30: 0.25 mg via INTRAVENOUS
  Filled 2018-11-30: qty 1

## 2018-11-30 MED ORDER — BUPIVACAINE HCL (PF) 0.25 % IJ SOLN
INTRAMUSCULAR | Status: AC
  Filled 2018-11-30: qty 30

## 2018-11-30 MED ORDER — ACETAMINOPHEN 10 MG/ML IV SOLN
INTRAVENOUS | Status: AC
  Filled 2018-11-30: qty 100

## 2018-11-30 MED ORDER — HYDROMORPHONE HCL 1 MG/ML IJ SOLN
0.2500 mg | INTRAMUSCULAR | Status: DC | PRN
  Administered 2018-11-30 (×2): 0.25 mg via INTRAVENOUS

## 2018-11-30 MED ORDER — MORPHINE SULFATE 4 MG/ML IJ SOLN
INTRAMUSCULAR | Status: DC | PRN
  Administered 2018-11-30 (×2): 4 mg via INTRAVENOUS

## 2018-11-30 MED ORDER — BUPIVACAINE-EPINEPHRINE (PF) 0.25% -1:200000 IJ SOLN
INTRAMUSCULAR | Status: AC
  Filled 2018-11-30: qty 30

## 2018-11-30 MED ORDER — MORPHINE SULFATE (PF) 4 MG/ML IV SOLN
INTRAVENOUS | Status: AC
  Filled 2018-11-30: qty 2

## 2018-11-30 MED ORDER — EPHEDRINE SULFATE-NACL 50-0.9 MG/10ML-% IV SOSY
PREFILLED_SYRINGE | INTRAVENOUS | Status: DC | PRN
  Administered 2018-11-30 (×2): 10 mg via INTRAVENOUS

## 2018-11-30 MED ORDER — MEPERIDINE HCL 25 MG/ML IJ SOLN: 6.2500 mg | INTRAMUSCULAR | Status: DC | PRN

## 2018-11-30 MED ORDER — ONDANSETRON HCL 4 MG/2ML IJ SOLN
INTRAMUSCULAR | Status: DC | PRN
  Administered 2018-11-30: 4 mg via INTRAVENOUS

## 2018-11-30 MED ORDER — MIDAZOLAM HCL 2 MG/2ML IJ SOLN
INTRAMUSCULAR | Status: AC
  Filled 2018-11-30: qty 2

## 2018-11-30 MED ORDER — MIDAZOLAM HCL 2 MG/2ML IJ SOLN
INTRAMUSCULAR | Status: DC | PRN
  Administered 2018-11-30: 2 mg via INTRAVENOUS

## 2018-11-30 MED ORDER — DEXAMETHASONE SODIUM PHOSPHATE 10 MG/ML IJ SOLN
INTRAMUSCULAR | Status: DC | PRN
  Administered 2018-11-30: 5 mg via INTRAVENOUS

## 2018-11-30 MED ORDER — ACETAMINOPHEN 10 MG/ML IV SOLN
1000.0000 mg | Freq: Once | INTRAVENOUS | Status: DC | PRN
  Administered 2018-11-30: 1000 mg via INTRAVENOUS

## 2018-11-30 MED ORDER — PROMETHAZINE HCL 25 MG/ML IJ SOLN: 6.2500 mg | INTRAMUSCULAR | Status: DC | PRN

## 2018-11-30 MED ORDER — CLONIDINE HCL (ANALGESIA) 100 MCG/ML EP SOLN
EPIDURAL | Status: DC | PRN
  Administered 2018-11-30 (×2): 100 ug

## 2018-11-30 MED ORDER — ACETAMINOPHEN 160 MG/5ML PO SOLN: 325.0000 mg | Freq: Once | ORAL | Status: DC | PRN

## 2018-11-30 MED ORDER — FENTANYL CITRATE (PF) 250 MCG/5ML IJ SOLN
INTRAMUSCULAR | Status: AC
  Filled 2018-11-30: qty 5

## 2018-11-30 MED ORDER — OXYCODONE HCL 5 MG PO TABS: 5.0000 mg | ORAL_TABLET | Freq: Four times a day (QID) | ORAL | 0 refills | Status: AC | PRN

## 2018-11-30 MED ORDER — FENTANYL CITRATE (PF) 250 MCG/5ML IJ SOLN
INTRAMUSCULAR | Status: DC | PRN
  Administered 2018-11-30 (×5): 50 ug via INTRAVENOUS

## 2018-11-30 MED ORDER — LIDOCAINE 2% (20 MG/ML) 5 ML SYRINGE
INTRAMUSCULAR | Status: DC | PRN
  Administered 2018-11-30: 40 mg via INTRAVENOUS

## 2018-11-30 MED ORDER — SODIUM CHLORIDE 0.9 % IR SOLN
Status: DC | PRN
  Administered 2018-11-30 (×3): 3000 mL

## 2018-11-30 MED ORDER — PROPOFOL 10 MG/ML IV BOLUS
INTRAVENOUS | Status: DC | PRN
  Administered 2018-11-30: 150 mg via INTRAVENOUS

## 2018-11-30 MED ORDER — PROPOFOL 10 MG/ML IV BOLUS
INTRAVENOUS | Status: AC
  Filled 2018-11-30: qty 20

## 2018-11-30 MED ORDER — EPINEPHRINE PF 1 MG/ML IJ SOLN
INTRAMUSCULAR | Status: AC
  Filled 2018-11-30: qty 4

## 2018-11-30 SURGICAL SUPPLY — 56 items
BANDAGE ESMARK 6X9 LF (GAUZE/BANDAGES/DRESSINGS) IMPLANT
BLADE CLIPPER SURG (BLADE) IMPLANT
BLADE EXCALIBUR 4.0X13 (MISCELLANEOUS) ×2 IMPLANT
BNDG ELASTIC 6X10 VLCR STRL LF (GAUZE/BANDAGES/DRESSINGS) ×2 IMPLANT
BNDG ESMARK 6X9 LF (GAUZE/BANDAGES/DRESSINGS)
CONT SPEC 4OZ CLIKSEAL STRL BL (MISCELLANEOUS) ×2 IMPLANT
COVER SURGICAL LIGHT HANDLE (MISCELLANEOUS) ×2 IMPLANT
COVER WAND RF STERILE (DRAPES) ×2 IMPLANT
CUFF TOURN SGL QUICK 34 (TOURNIQUET CUFF)
CUFF TOURN SGL QUICK 42 (TOURNIQUET CUFF) IMPLANT
CUFF TRNQT CYL 34X4.125X (TOURNIQUET CUFF) IMPLANT
DRAPE ARTHROSCOPY W/POUCH 114 (DRAPES) ×2 IMPLANT
DRAPE U-SHAPE 47X51 STRL (DRAPES) ×2 IMPLANT
DRSG TEGADERM 4X4.75 (GAUZE/BANDAGES/DRESSINGS) ×2 IMPLANT
DRSG XEROFORM 1X8 (GAUZE/BANDAGES/DRESSINGS) ×2 IMPLANT
DURAPREP 26ML APPLICATOR (WOUND CARE) ×2 IMPLANT
DW OUTFLOW CASSETTE/TUBE SET (MISCELLANEOUS) ×2 IMPLANT
GAUZE SPONGE 4X4 12PLY STRL (GAUZE/BANDAGES/DRESSINGS) IMPLANT
GAUZE SPONGE 4X4 12PLY STRL LF (GAUZE/BANDAGES/DRESSINGS) ×2 IMPLANT
GAUZE XEROFORM 1X8 LF (GAUZE/BANDAGES/DRESSINGS) IMPLANT
GLOVE BIOGEL PI IND STRL 8 (GLOVE) ×1 IMPLANT
GLOVE BIOGEL PI INDICATOR 8 (GLOVE) ×1
GLOVE SURG ORTHO 8.0 STRL STRW (GLOVE) ×2 IMPLANT
GOWN STRL REUS W/ TWL LRG LVL3 (GOWN DISPOSABLE) ×2 IMPLANT
GOWN STRL REUS W/ TWL XL LVL3 (GOWN DISPOSABLE) ×1 IMPLANT
GOWN STRL REUS W/TWL LRG LVL3 (GOWN DISPOSABLE) ×2
GOWN STRL REUS W/TWL XL LVL3 (GOWN DISPOSABLE) ×1
KIT BASIN OR (CUSTOM PROCEDURE TRAY) ×2 IMPLANT
KIT TURNOVER KIT B (KITS) ×2 IMPLANT
MANIFOLD NEPTUNE II (INSTRUMENTS) IMPLANT
NEEDLE 18GX1X1/2 (RX/OR ONLY) (NEEDLE) IMPLANT
NEEDLE 22X1 1/2 (OR ONLY) (NEEDLE) ×4 IMPLANT
NEEDLE FILTER BLUNT 18X 1/2SAF (NEEDLE) ×1
NEEDLE FILTER BLUNT 18X1 1/2 (NEEDLE) ×1 IMPLANT
NEEDLE HYPO 25GX1X1/2 BEV (NEEDLE) ×2 IMPLANT
NS IRRIG 1000ML POUR BTL (IV SOLUTION) IMPLANT
PACK ARTHROSCOPY DSU (CUSTOM PROCEDURE TRAY) ×2 IMPLANT
PAD ARMBOARD 7.5X6 YLW CONV (MISCELLANEOUS) ×4 IMPLANT
PAD CAST 4YDX4 CTTN HI CHSV (CAST SUPPLIES) ×1 IMPLANT
PADDING CAST COTTON 4X4 STRL (CAST SUPPLIES) ×1
PADDING CAST COTTON 6X4 STRL (CAST SUPPLIES) ×2 IMPLANT
PORT APPOLLO RF 90DEGREE MULTI (SURGICAL WAND) ×2 IMPLANT
SPONGE LAP 4X18 RFD (DISPOSABLE) ×2 IMPLANT
SUT ETHILON 3 0 PS 1 (SUTURE) ×2 IMPLANT
SUT VIC AB 3-0 CT1 27 (SUTURE) ×1
SUT VIC AB 3-0 CT1 TAPERPNT 27 (SUTURE) ×1 IMPLANT
SYR 20ML ECCENTRIC (SYRINGE) ×2 IMPLANT
SYR 3ML LL SCALE MARK (SYRINGE) ×4 IMPLANT
SYR CONTROL 10ML LL (SYRINGE) IMPLANT
SYR TB 1ML LUER SLIP (SYRINGE) ×2 IMPLANT
TOWEL GREEN STERILE (TOWEL DISPOSABLE) ×2 IMPLANT
TOWEL GREEN STERILE FF (TOWEL DISPOSABLE) ×2 IMPLANT
TUBE CONNECTING 12X1/4 (SUCTIONS) ×2 IMPLANT
TUBING ARTHROSCOPY IRRIG 16FT (MISCELLANEOUS) ×2 IMPLANT
WAND STAR VAC 90 (SURGICAL WAND) IMPLANT
WATER STERILE IRR 1000ML POUR (IV SOLUTION) ×2 IMPLANT

## 2018-11-30 NOTE — Transfer of Care (Signed)
Immediate Anesthesia Transfer of Care Note  Patient: Erin Jarvis  Procedure(s) Performed: right knee arthroscopy with debridement (Right Knee)  Patient Location: PACU  Anesthesia Type:General  Level of Consciousness: awake, alert  and patient cooperative  Airway & Oxygen Therapy: Patient Spontanous Breathing  Post-op Assessment: Report given to RN and Post -op Vital signs reviewed and stable  Post vital signs: Reviewed and stable  Last Vitals:  Vitals Value Taken Time  BP 103/54 11/30/18 1619  Temp    Pulse 76 11/30/18 1619  Resp 12 11/30/18 1619  SpO2 100 % 11/30/18 1619  Vitals shown include unvalidated device data.  Last Pain:  Vitals:   11/30/18 1431  TempSrc:   PainSc: 4       Patients Stated Pain Goal: 3 ([id number])  Complications: No apparent anesthesia complications

## 2018-11-30 NOTE — Brief Op Note (Signed)
   11/30/2018  4:06 PM  PATIENT:  Erin Jarvis  53 y.o. female  PRE-OPERATIVE DIAGNOSIS:  right locked knee  POST-OPERATIVE DIAGNOSIS:  right locked knee with large loose body in the intercondylar notch and anterior horn lateral meniscal tear  PROCEDURE:  Procedure(s): right knee arthroscopy with debridement, removal of loose body, partial lateral meniscectomy SURGEON:  Surgeon(s): Marlou Sa, Tonna Corner, MD  ASSISTANT: magnant pa  ANESTHESIA:   general  EBL: 5 ml    Total I/O In: 800 [I.V.:800] Out: -   BLOOD ADMINISTERED: none  DRAINS: none   LOCAL MEDICATIONS USED:  none  SPECIMEN:  No Specimen  COUNTS:  YES  TOURNIQUET:  * No tourniquets in log *  DICTATION: .Other Dictation: Dictation Number [id number]  PLAN OF CARE: Discharge to home after PACU  PATIENT DISPOSITION:  PACU - hemodynamically stable

## 2018-11-30 NOTE — Anesthesia Procedure Notes (Signed)
Procedure Name: LMA Insertion Date/Time: 11/30/2018 3:07 PM Performed by: Elayne Snare, CRNA Pre-anesthesia Checklist: Patient identified, Emergency Drugs available, Suction available and Patient being monitored Patient Re-evaluated:Patient Re-evaluated prior to induction Oxygen Delivery Method: Circle System Utilized Preoxygenation: Pre-oxygenation with 100% oxygen Induction Type: IV induction Ventilation: Mask ventilation without difficulty LMA: LMA inserted LMA Size: 4.0 Number of attempts: 1 Placement Confirmation: positive ETCO2 Tube secured with: Tape Dental Injury: Teeth and Oropharynx as per pre-operative assessment

## 2018-11-30 NOTE — H&P (Signed)
Erin Jarvis is an 53 y.o. female.   Chief Complaint: Right knee pain HPI: Erin Jarvis is a 53 year old patient who injured her right knee 3 weeks ago.  She is been unable to fully extend the knee since that time.  Presents now for operative management after explanation of risks and benefits.  MRI scanning not possible in this case due to placement of defibrillator.  Patient is on blood thinners and she has been placed on a Lovenox bridge.  Patient understands the risk and benefits and wishes to proceed.  Past Medical History:  Diagnosis Date  . AICD (automatic cardioverter/defibrillator) present 2002, 2010   secondary to post-partum cardiomyopathy (1996)  . Arm vein blood clot, right   . Arthritis   . CHF (congestive heart failure) ([place])   . Chronic kidney disease    only 1 kidney - left kidney is the size of a thumb  . Depression   . Diabetes mellitus without complication ([place])    no longer on meds since weight loss  . Hypertension   . Hypertensive cardiovascular disease   . Hypothyroidism   . Obesity   . Persistent atrial fibrillation   . Pneumonia   . Postpartum cardiomyopathy    Last echocardiogram was last year and EF 45 -50 %  . Seasonal allergies   . Sleep apnea    complaint with CPAP  . Ventricular tachycardia (HCC)    s/p ICD Implantation (MDT Secura VR)    Past Surgical History:  Procedure Laterality Date  . CARDIAC CATHETERIZATION N/A 04/11/2016   Procedure: Right/Left Heart Cath and Coronary Angiography;  Surgeon: Larey Dresser, MD;  Location: [place] CV LAB;  Service: Cardiovascular;  Laterality: N/A;  . CARDIAC DEFIBRILLATOR PLACEMENT  08/12/2000, 11/11/2008   Most recent Generator MDT Secura VR placed in [place]  . CESAREAN SECTION  1996  . CHOLECYSTECTOMY  2004  . GASTRIC BYPASS  05/2017  . ICD GENERATOR CHANGEOUT  03/02/2018  . ICD GENERATOR CHANGEOUT N/A 03/02/2018   Procedure: ICD GENERATOR CHANGEOUT;  Surgeon: Evans Lance, MD;  Location: [place] CV LAB;  Service: Cardiovascular;  Laterality: N/A;  . IR GENERIC HISTORICAL  06/13/2014   IR RADIOLOGIST EVAL & MGMT 06/13/2014 Greggory Keen, MD [facility] INTERV RAD  . IR GENERIC HISTORICAL  04/09/2016   IR FLUORO GUIDE CV LINE LEFT 04/09/2016 Greggory Keen, MD [facility]  . IR GENERIC HISTORICAL  04/09/2016   IR US GUIDE VASC ACCESS RIGHT 04/09/2016 Greggory Keen, MD [facility]  . IR GENERIC HISTORICAL  04/09/2016   IR VENO/EXT/UNI RIGHT 04/09/2016 Greggory Keen, MD [facility]  . IR GENERIC HISTORICAL  04/09/2016   IR US GUIDE VASC ACCESS LEFT 04/09/2016 Greggory Keen, MD [facility]  . LEAD REVISION/REPAIR N/A 03/02/2018   Procedure: LEAD REVISION/REPAIR;  Surgeon: Evans Lance, MD;  Location: [place] [facility];  Service: Cardiovascular;  Laterality: N/A;  . TEE WITHOUT CARDIOVERSION N/A 04/08/2016   Procedure: TRANSESOPHAGEAL ECHOCARDIOGRAM (TEE);  Surgeon: Pixie Casino, MD;  Location: [facility] ENDOSCOPY;  Service: Cardiovascular;  Laterality: N/A;    Family History  Problem Relation Age of Onset  . Other Mother        has chronic pain syndrome from DDD with spinal cord stimulator  . Hypertension Mother   . COPD Father   . Arthritis/Rheumatoid Father    Social History:  reports that she has never smoked. She has never used smokeless tobacco. She reports that she does not drink  alcohol or use drugs.  Allergies:  Allergies  Allergen Reactions  . Morphine And Related Nausea Only and Other (See Comments)    Feels sick,and like she is going to pass out; can't breathe, starts sweating  . Atorvastatin     Nightmares    Medications Prior to Admission  Medication Sig Dispense Refill  . acetaminophen (TYLENOL) 500 MG tablet Take 1,000 mg by mouth every 6 (six) hours as needed for mild pain or moderate pain.    Marland Kitchen apixaban (ELIQUIS) 5 MG TABS tablet Take 1 tablet (5 mg total) by mouth 2 (two) times daily. 180 tablet 3  . Calcium Citrate-Vitamin D (CALCIUM CITRATE + D) 250-200  MG-UNIT TABS Take 1 tablet by mouth daily.    . diphenhydramine-acetaminophen (TYLENOL PM) 25-500 MG TABS tablet Take 1 tablet by mouth at bedtime as needed (sleep).    . ferrous sulfate 325 (65 FE) MG tablet Take 325 mg by mouth daily with breakfast.    . levothyroxine (SYNTHROID) 25 MCG tablet Take 1 tablet (25 mcg total) by mouth daily before breakfast. 30 tablet 6  . metoprolol succinate (TOPROL-XL) 100 MG 24 hr tablet TAKE 1 TABLET BY MOUTH TWICE DAILY (Patient taking differently: Take 100 mg by mouth 2 (two) times daily. ) 60 tablet 1  . Multiple Vitamin (MULTIVITAMIN) tablet Take 1 tablet by mouth daily.    . pantoprazole (PROTONIX) 40 MG tablet Take 1 tablet (40 mg total) by mouth daily. 30 tablet 12  . sacubitril-valsartan (ENTRESTO) 24-26 MG Take 1 tablet by mouth 2 (two) times daily. 180 tablet 3  . spironolactone (ALDACTONE) 25 MG tablet TAKE ONE TABLET BY MOUTH EACH DAY (Patient taking differently: Take 25 mg by mouth daily. ) 30 tablet 11  . torsemide (DEMADEX) 20 MG tablet Take 1 tablet (20 mg total) by mouth daily. 90 tablet 2  . vitamin B-12 (CYANOCOBALAMIN) 1000 MCG tablet Take 1,000 mcg by mouth daily.    . citalopram (CELEXA) 10 MG tablet Take 10 mg by mouth daily.       Results for orders placed or performed during the hospital encounter of 11/30/18 (from the past 48 hour(s))  Glucose, capillary     Status: None   Collection Time: 11/30/18  2:25 PM  Result Value Ref Range   Glucose-Capillary 73 70 - 99 mg/dL   No results found.  Review of Systems  Musculoskeletal: Positive for joint pain.  All other systems reviewed and are negative.   Blood pressure 107/63, pulse 72, temperature 98.9 F (37.2 C), temperature source Oral, resp. rate 18, height 5\' 6"  (1.676 m), weight 85.6 kg, last menstrual period 03/14/2014, SpO2 100 %. Physical Exam  Constitutional: She appears well-developed.  HENT:  Head: Normocephalic.  Eyes: Pupils are equal, round, and reactive to light.   Neck: Normal range of motion.  Cardiovascular: Normal rate.  Respiratory: Effort normal.  Neurological: She is alert.  Skin: Skin is warm.  Psychiatric: She has a normal mood and affect.  Examination of the right knee demonstrates stable collateral and cruciate ligaments but she lacks last 15 degrees of full extension.  Trace effusion is present.  Pedal pulses palpable.  No masses lymphadenopathy or skin changes noted in that right knee region  Assessment/Plan Impression is right knee block to full extension.  Plain radiographs unremarkable.  MRI scanning not possible.  Plan is arthroscopy with debridement of either a loose body or meniscal tear and assessment of what ever is blocking full extension.  Anticipate  this could be a medial or lateral meniscal tear which is flipped into the front of the joint.  We will assess and treat as indicated.  Risk and benefits are discussed include but limited to infection nerve vessel damage bleeding as well as incomplete restoration of function and continued pain all questions answered  Anderson Malta, MD 11/30/2018, 2:42 PM

## 2018-12-01 ENCOUNTER — Ambulatory Visit (INDEPENDENT_AMBULATORY_CARE_PROVIDER_SITE_OTHER): Payer: Medicare Other

## 2018-12-01 ENCOUNTER — Encounter (HOSPITAL_COMMUNITY): Payer: Self-pay | Admitting: Orthopedic Surgery

## 2018-12-01 DIAGNOSIS — Z95810 Presence of automatic (implantable) cardiac defibrillator: Secondary | ICD-10-CM

## 2018-12-01 DIAGNOSIS — I5022 Chronic systolic (congestive) heart failure: Secondary | ICD-10-CM

## 2018-12-01 NOTE — Op Note (Signed)
NAME: Erin Jarvis, Erin Jarvis MEDICAL RECORD [id number] ACCOUNT [id number] DATE OF BIRTH:1965-11-23 FACILITY: [facility] LOCATION: [facility] PHYSICIAN:Aerilyn Slee Randel Pigg, MD  OPERATIVE REPORT  DATE OF PROCEDURE:  11/30/2018  PREOPERATIVE DIAGNOSIS:  Right locked knee.  POSTOPERATIVE DIAGNOSIS:  Right knee lateral meniscal tear with large loose body in the intercondylar notch.  PROCEDURE:  Right knee arthroscopy with removal of loose body and partial lateral meniscectomy.  SURGEON:  Meredith Pel, MD  ASSISTANT:  Annie Main, PA-C   INDICATIONS:  The patient is a 53 year old patient with locked right knee for 3 weeks after injury.  She presents now for operative management after explanation of risks and benefits.  PROCEDURE IN DETAIL:  The patient was brought to the operating room where general anesthetic was induced.  Preoperative antibiotics were administered.  Time-out was called.  Right leg was prescrubbed with alcohol and Betadine, allowed to air dry, prepped  with DuraPrep solution, and draped in a sterile manner.  Anterior inferior lateral portal was established.  Anterior inferior medial portal was established under direct visualization.  Diagnostic arthroscopy was performed.  The patient had a large loose  body in the intercondylar notch measuring about 1.5 x 2 cm.  This was calcified.  Donor site was not found, although the patient did have significant medial compartment femoral-sided arthritis.  The medial portal was extended and the loose body was  removed.  At this time, thorough evaluation of the joint was performed.  The patient had a significant patellofemoral wear grade IV on both the trochlea and the undersurface of the patella.  No loose bodies in the medial and lateral gutter.  The lateral  compartment was relatively spared with intact posterior horn of the lateral meniscus, but there was an anterior horn of the lateral meniscal tear.  This was debrided back to a stable rim.   This tear involved about 60% to 65% of the anterior posterior  width of the rim.  The medial compartment had more significant osteoarthritis, grade III chondromalacia over about 75% of the weightbearing surface area of the medial femoral condyle.  The medial meniscus was intact, and the medial tibial plateau had  only grade I changes.  ACL, PCL were intact.  Following removal of loose body and debridement of that lateral meniscal tear, a thorough irrigation of the joint was performed.  No other loose pieces were found except for one in that medial compartment  which measured about 6 x 6 mm, and this was also removed.  At this time, an Arthrocare wand was used to control any bleeding points, and debridement of the fat pad just anterior to the ACL was also performed in order to facilitate full extension.  At  this time, instruments were removed after thorough irrigation.  Portals were closed using 3-0 Vicryl, 3-0 nylon, and then a solution of Marcaine and clonidine was injected into the knee for postop pain relief.  Impervious dressings and an Ace wrap  placed.  The patient tolerated the procedure well without immediate complications and transferred to the recovery room in stable condition.  Luke's assistance was required for opening and closing as well as limb positioning and alignment.  LN/NUANCE  D:11/30/2018 T:12/01/2018 JOB:[id number]

## 2018-12-01 NOTE — Anesthesia Postprocedure Evaluation (Signed)
Anesthesia Post Note  Patient: Erin Jarvis  Procedure(s) Performed: right knee arthroscopy with debridement (Right Knee)     Patient location during evaluation: PACU Anesthesia Type: General Level of consciousness: awake and alert Pain management: pain level controlled Vital Signs Assessment: post-procedure vital signs reviewed and stable Respiratory status: spontaneous breathing, nonlabored ventilation, respiratory function stable and patient connected to nasal cannula oxygen Cardiovascular status: blood pressure returned to baseline and stable Postop Assessment: no apparent nausea or vomiting Anesthetic complications: no    Last Vitals:  Vitals:   11/30/18 1700 11/30/18 1715  BP:  108/66  Pulse: 63 66  Resp: 11 17  Temp:  (!) 36.4 C  SpO2: 100% 100%    Last Pain:  Vitals:   11/30/18 1715  TempSrc:   PainSc: 2                  Effie Berkshire

## 2018-12-03 ENCOUNTER — Telehealth: Payer: Self-pay | Admitting: Orthopedic Surgery

## 2018-12-03 ENCOUNTER — Other Ambulatory Visit (HOSPITAL_COMMUNITY): Payer: Medicare Other

## 2018-12-03 NOTE — Telephone Encounter (Signed)
Patient returning Dr. Randel Pigg call.  She also has some questions in regards to her surgery and how much she can straighten it out.  CB#[phone].  Thank you.

## 2018-12-03 NOTE — Telephone Encounter (Signed)
See below

## 2018-12-03 NOTE — Progress Notes (Signed)
EPIC Encounter for ICM Monitoring  Patient Name: NICTE SORBER is a 53 y.o. female Date: 12/03/2018 Primary Care Physican: Patient, No Pcp Per Primary Cardiologist:Hochrein/McLean Electrophysiologist: Allred Nephrologist: Dr Hollie Salk at [facility] with patient.  She said she is doing fine.   BMET was rescheduled from 9/4 to 9/11 by HF clinic.  OptiVolThoracic impedance returned to normal after starting the Torsemide prescription as ordered by Dr Aundra Dubin.  Prescribed:Torsemide 20 mg 1 tablet daily.  Labs: 10/18/2018 Creatinine1.55, Satira Mccallum [id number], [id number] 10/08/2018 Creatinine2.39, BUN31, Potassium4.0, Sodium135, [id number]  05/28/2018 Creatinine1.70, Doneta Public, Potassium3.6, [id number], [id number] 05/18/2018 Creatinine1.88, BUN24, Potassium3.8, Sodium140, [id number]  A complete set of results can be found in Results Review.  Recommendations: No changes and encouraged to call if experiencing any fluid symptoms.  Follow-up plan: ICM clinic phone appointment on 01/19/2019.   91 day device clinic remote transmission 01/18/2019.    Copy of ICM check sent to Dr. Rayann Heman.   3 month ICM trend: 12/01/2018    1 Year ICM trend:       [name], RN 12/03/2018 3:17 PM

## 2018-12-03 NOTE — Telephone Encounter (Signed)
I called.  She is okay to keep working on getting it straight and I explained that to her.

## 2018-12-08 ENCOUNTER — Ambulatory Visit (INDEPENDENT_AMBULATORY_CARE_PROVIDER_SITE_OTHER): Payer: Medicare Other | Admitting: Orthopedic Surgery

## 2018-12-08 ENCOUNTER — Encounter: Payer: Self-pay | Admitting: Orthopedic Surgery

## 2018-12-08 DIAGNOSIS — M2391 Unspecified internal derangement of right knee: Secondary | ICD-10-CM

## 2018-12-10 ENCOUNTER — Encounter: Payer: Self-pay | Admitting: Orthopedic Surgery

## 2018-12-10 ENCOUNTER — Other Ambulatory Visit: Payer: Self-pay

## 2018-12-10 ENCOUNTER — Ambulatory Visit (HOSPITAL_COMMUNITY)
Admission: RE | Admit: 2018-12-10 | Discharge: 2018-12-10 | Disposition: A | Discharge status: Home / Self Care | Payer: Medicare Other | Source: Ambulatory Visit | Attending: Cardiology | Admitting: Cardiology

## 2018-12-10 DIAGNOSIS — I5022 Chronic systolic (congestive) heart failure: Secondary | ICD-10-CM

## 2018-12-10 LAB — BASIC METABOLIC PANEL
Anion gap: 11 (ref 5–15)
BUN: 45 mg/dL — ABNORMAL HIGH (ref 6–20)
CO2: 24 mmol/L (ref 22–32)
Calcium: 9.6 mg/dL (ref 8.9–10.3)
Chloride: 105 mmol/L (ref 98–111)
Creatinine, Ser: 1.8 mg/dL — ABNORMAL HIGH (ref 0.44–1.00)
GFR calc Af Amer: 36 mL/min — ABNORMAL LOW (ref >60)
GFR calc non Af Amer: 31 mL/min — ABNORMAL LOW (ref >60)
Glucose, Bld: 88 mg/dL (ref 70–99)
Potassium: 3.5 mmol/L (ref 3.5–5.1)
Sodium: 140 mmol/L (ref 135–145)

## 2018-12-10 NOTE — Progress Notes (Signed)
Post-Op Visit Note   Patient: Erin Jarvis           Date of Birth: 1966-01-17           MRN: [id number] Visit Date: 12/08/2018 PCP: Patient, No Pcp Per   Assessment & Plan:  Chief Complaint:  Chief Complaint  Patient presents with  . Right Knee - Follow-up   Visit Diagnoses:  1. Locked knee, right     Plan: Patient is a 53 year old female who presents s/p right knee arthroscopy with debridement on 11/30/2018.  Patient had a large loose body removed.  She is doing okay today.  She notes pain that is worse with driving or a lot of walking.  Sutures were removed today.  She is using her crutches occasionally.  She has resumed her Eliquis and does not have any calf tenderness.  No knee effusion today.  She will work on straightening the right knee.  Portal incisions are healing well.  Denies any drainage, fever, chills.  Her extension gets to about 10 degrees today in the office.  She will follow-up in 4 weeks for clinical recheck.  Follow-Up Instructions: Return in about 4 weeks (around 01/05/2019).   Orders:  No orders of the defined types were placed in this encounter.  No orders of the defined types were placed in this encounter.   Imaging: No results found.  PMFS History: Patient Active Problem List   Diagnosis Date Noted  . Cardiomyopathy ([place]) 03/02/2018  . Acute on chronic systolic CHF (congestive heart failure), NYHA class 4 ([place]) 04/08/2016  . Atrial fibrillation ([place]) [I48.91] 04/03/2016  . Encounter for therapeutic drug monitoring 04/03/2016  . Persistent atrial fibrillation 03/26/2016  . Fibroids, intramural 05/15/2014  . Fibroid 05/15/2014  . Intramural leiomyoma of uterus   . Morbid obesity ([place]) 01/21/2014  . Sleep apnea 01/21/2014  . Fatigue 12/17/2012  . Snoring 12/17/2012  . Congestive heart failure, unspecified 12/15/2011  . Ventricular tachycardia ([place]) 12/15/2011  . Postpartum cardiomyopathy   . Unspecified essential hypertension   .  Artificial cardiac pacemaker 03/22/2009  . Benign essential HTN 08/30/2008  . Thromboembolism of vein 05/19/2006  . History of anticoagulant therapy 05/19/2006  . Anasarca 04/29/2006  . Polypharmacy 12/09/2004  . Automatic implantable cardioverter-defibrillator in situ 12/09/2004  . Dilated cardiomyopathy ([place]) 12/09/2004  . CCF (congestive cardiac failure) ([place]) 12/09/2004   Past Medical History:  Diagnosis Date  . AICD (automatic cardioverter/defibrillator) present 2002, 2010   secondary to post-partum cardiomyopathy (1996)  . Arm vein blood clot, right   . Arthritis   . CHF (congestive heart failure) ([place])   . Chronic kidney disease    only 1 kidney - left kidney is the size of a thumb  . Depression   . Diabetes mellitus without complication ([place])    no longer on meds since weight loss  . Hypertension   . Hypertensive cardiovascular disease   . Hypothyroidism   . Obesity   . Persistent atrial fibrillation   . Pneumonia   . Postpartum cardiomyopathy    Last echocardiogram was last year and EF 45 -50 %  . Seasonal allergies   . Sleep apnea    complaint with CPAP  . Ventricular tachycardia (HCC)    s/p ICD Implantation (MDT Secura VR)    Family History  Problem Relation Age of Onset  . Other Mother        has chronic pain syndrome from DDD with spinal cord stimulator  . Hypertension Mother   .  COPD Father   . Arthritis/Rheumatoid Father     Past Surgical History:  Procedure Laterality Date  . CARDIAC CATHETERIZATION N/A 04/11/2016   Procedure: Right/Left Heart Cath and Coronary Angiography;  Surgeon: Larey Dresser, MD;  Location: [facility];  Service: Cardiovascular;  Laterality: N/A;  . CARDIAC DEFIBRILLATOR PLACEMENT  08/12/2000, 11/11/2008   Most recent Generator MDT Secura VR placed in [place]  . CESAREAN SECTION  1996  . CHOLECYSTECTOMY  2004  . GASTRIC BYPASS  05/2017  . ICD GENERATOR CHANGEOUT  03/02/2018  . ICD GENERATOR CHANGEOUT N/A 03/02/2018    Procedure: ICD GENERATOR CHANGEOUT;  Surgeon: Evans Lance, MD;  Location: [facility];  Service: Cardiovascular;  Laterality: N/A;  . IR GENERIC HISTORICAL  06/13/2014   IR RADIOLOGIST EVAL & MGMT 06/13/2014 Greggory Keen, MD [facility] INTER[facility] RAD  . IR GENERIC HISTORICAL  04/09/2016   IR FLUORO GUIDE CV LINE LEFT 04/09/2016 Greggory Keen, MD [facility]  . IR GENERIC HISTORICAL  04/09/2016   IR US GUIDE VASC ACCESS RIGHT 04/09/2016 Greggory Keen, MD [facility]  . IR GENERIC HISTORICAL  04/09/2016   IR VENO/EXT/UNI RIGHT 04/09/2016 Greggory Keen, MD [facility]  . IR GENERIC HISTORICAL  04/09/2016   IR US GUIDE VASC ACCESS LEFT 04/09/2016 Greggory Keen, MD [facility]  . KNEE ARTHROSCOPY Right 11/30/2018   Procedure: right knee arthroscopy with debridement;  Surgeon: Meredith Pel, MD;  Location: [facility];  Service: Orthopedics;  Laterality: Right;  . LEAD REVISION/REPAIR N/A 03/02/2018   Procedure: LEAD REVISION/REPAIR;  Surgeon: Evans Lance, MD;  Location: [facility];  Service: Cardiovascular;  Laterality: N/A;  . TEE WITHOUT CARDIOVERSION N/A 04/08/2016   Procedure: TRANSESOPHAGEAL ECHOCARDIOGRAM (TEE);  Surgeon: Pixie Casino, MD;  Location: [facility];  Service: Cardiovascular;  Laterality: N/A;   Social History   Occupational History  . Not on file  Tobacco Use  . Smoking status: Never Smoker  . Smokeless tobacco: Never Used  Substance and Sexual Activity  . Alcohol use: No    Alcohol/week: 0.0 standard drinks  . Drug use: No  . Sexual activity: Not on file

## 2018-12-14 ENCOUNTER — Telehealth (HOSPITAL_COMMUNITY): Payer: Self-pay

## 2018-12-14 DIAGNOSIS — I5022 Chronic systolic (congestive) heart failure: Secondary | ICD-10-CM

## 2018-12-14 NOTE — Telephone Encounter (Signed)
-----   Message from Larey Dresser, MD sent at 12/10/2018  4:01 PM EDT ----- Increase po fluid intake.  Make sure she is not taking torsemide.  Repeat BMET on Monday or Tuesday.

## 2018-12-14 NOTE — Telephone Encounter (Signed)
Unable to spewak with patient on 9/11. After multiple attempts, spoke with patient this morning.  Spoke with patient, she was advised of instructions to d/c torsemide and increase po fluid. Per MD, pt should rtc in 1 week for bmet. Verbalized understanding. appt made for 1 week

## 2018-12-21 ENCOUNTER — Ambulatory Visit (HOSPITAL_COMMUNITY)
Admission: RE | Admit: 2018-12-21 | Discharge: 2018-12-21 | Disposition: A | Discharge status: Home / Self Care | Payer: Medicare Other | Source: Ambulatory Visit | Attending: Cardiology | Admitting: Cardiology

## 2018-12-21 ENCOUNTER — Other Ambulatory Visit: Payer: Self-pay

## 2018-12-21 DIAGNOSIS — I5022 Chronic systolic (congestive) heart failure: Secondary | ICD-10-CM | POA: Insufficient documentation

## 2018-12-21 LAB — BASIC METABOLIC PANEL
Anion gap: 7 (ref 5–15)
BUN: 15 mg/dL (ref 6–20)
CO2: 21 mmol/L — ABNORMAL LOW (ref 22–32)
Calcium: 9.2 mg/dL (ref 8.9–10.3)
Chloride: 115 mmol/L — ABNORMAL HIGH (ref 98–111)
Creatinine, Ser: 1.24 mg/dL — ABNORMAL HIGH (ref 0.44–1.00)
GFR calc Af Amer: 57 mL/min — ABNORMAL LOW (ref >60)
GFR calc non Af Amer: 49 mL/min — ABNORMAL LOW (ref >60)
Glucose, Bld: 85 mg/dL (ref 70–99)
Potassium: 3.7 mmol/L (ref 3.5–5.1)
Sodium: 143 mmol/L (ref 135–145)

## 2018-12-27 ENCOUNTER — Other Ambulatory Visit (HOSPITAL_COMMUNITY): Payer: Medicare Other

## 2018-12-27 ENCOUNTER — Other Ambulatory Visit (HOSPITAL_COMMUNITY): Payer: Self-pay

## 2018-12-27 DIAGNOSIS — I5022 Chronic systolic (congestive) heart failure: Secondary | ICD-10-CM

## 2018-12-27 NOTE — Progress Notes (Addendum)
Pt reschduled lab appt.

## 2018-12-27 NOTE — Addendum Note (Signed)
Addended by: Valeda Malm on: 12/27/2018 08:57 AM   Modules accepted: Orders

## 2018-12-31 ENCOUNTER — Other Ambulatory Visit (HOSPITAL_COMMUNITY): Payer: Self-pay

## 2018-12-31 MED ORDER — SACUBITRIL-VALSARTAN 24-26 MG PO TABS: 1.0000 | ORAL_TABLET | Freq: Two times a day (BID) | ORAL | 3 refills | Status: DC

## 2019-01-03 ENCOUNTER — Ambulatory Visit (HOSPITAL_COMMUNITY)
Admission: RE | Admit: 2019-01-03 | Discharge: 2019-01-03 | Disposition: A | Discharge status: Home / Self Care | Payer: Medicare Other | Source: Ambulatory Visit | Attending: Cardiology | Admitting: Cardiology

## 2019-01-03 ENCOUNTER — Other Ambulatory Visit (HOSPITAL_COMMUNITY): Payer: Self-pay | Admitting: *Deleted

## 2019-01-03 ENCOUNTER — Other Ambulatory Visit: Payer: Self-pay

## 2019-01-03 DIAGNOSIS — I5022 Chronic systolic (congestive) heart failure: Secondary | ICD-10-CM

## 2019-01-03 MED ORDER — APIXABAN 5 MG PO TABS: 5.0000 mg | ORAL_TABLET | Freq: Two times a day (BID) | ORAL | 3 refills | Status: DC

## 2019-01-05 ENCOUNTER — Ambulatory Visit: Payer: Medicare Other | Admitting: Orthopedic Surgery

## 2019-01-12 ENCOUNTER — Encounter: Payer: Self-pay | Admitting: Orthopedic Surgery

## 2019-01-12 ENCOUNTER — Ambulatory Visit (INDEPENDENT_AMBULATORY_CARE_PROVIDER_SITE_OTHER): Payer: Medicare Other | Admitting: Orthopedic Surgery

## 2019-01-12 ENCOUNTER — Other Ambulatory Visit: Payer: Self-pay

## 2019-01-12 VITALS — Ht 67.0 in | Wt 180.0 lb

## 2019-01-12 DIAGNOSIS — M2391 Unspecified internal derangement of right knee: Secondary | ICD-10-CM

## 2019-01-12 NOTE — Progress Notes (Signed)
Post-Op Visit Note   Patient: Erin Jarvis           Date of Birth: 09/13/1965           MRN: [id number] Visit Date: 01/12/2019 PCP: Patient, No Pcp Per   Assessment & Plan:  Chief Complaint:  Chief Complaint  Patient presents with  . Right Knee - Follow-up    11/30/2018 Right Knee Arthroscopy with Debridement   Visit Diagnoses:  1. Locked knee, right     Plan: Erin Jarvis is now about 6 weeks out right knee arthroscopy and debridement with partial lateral meniscectomy and removal of large loose body central aspect of the knee.  On exam she still lacking about 5 degrees of full extension but her gait is better.  Trace effusion is present.  Flexion is easily past 90.  Quad strength is improving.  Overall she is doing well.  I am going to release her at this time to a home exercise program.  Follow-up with me as needed.  Follow-Up Instructions: Return if symptoms worsen or fail to improve.   Orders:  No orders of the defined types were placed in this encounter.  No orders of the defined types were placed in this encounter.   Imaging: No results found.  PMFS History: Patient Active Problem List   Diagnosis Date Noted  . Cardiomyopathy ([place]) 03/02/2018  . Acute on chronic systolic CHF (congestive heart failure), NYHA class 4 ([place]) 04/08/2016  . Atrial fibrillation ([place]) [I48.91] 04/03/2016  . Encounter for therapeutic drug monitoring 04/03/2016  . Persistent atrial fibrillation ([place]) 03/26/2016  . Fibroids, intramural 05/15/2014  . Fibroid 05/15/2014  . Intramural leiomyoma of uterus   . Morbid obesity ([place]) 01/21/2014  . Sleep apnea 01/21/2014  . Fatigue 12/17/2012  . Snoring 12/17/2012  . Congestive heart failure, unspecified 12/15/2011  . Ventricular tachycardia ([place]) 12/15/2011  . Postpartum cardiomyopathy   . Unspecified essential hypertension   . Artificial cardiac pacemaker 03/22/2009  . Benign essential HTN 08/30/2008  . Thromboembolism of vein 05/19/2006  .  History of anticoagulant therapy 05/19/2006  . Anasarca 04/29/2006  . Polypharmacy 12/09/2004  . Automatic implantable cardioverter-defibrillator in situ 12/09/2004  . Dilated cardiomyopathy ([place]) 12/09/2004  . CCF (congestive cardiac failure) ([place]) 12/09/2004   Past Medical History:  Diagnosis Date  . AICD (automatic cardioverter/defibrillator) present 2002, 2010   secondary to post-partum cardiomyopathy (1996)  . Arm vein blood clot, right   . Arthritis   . CHF (congestive heart failure) ([place])   . Chronic kidney disease    only 1 kidney - left kidney is the size of a thumb  . Depression   . Diabetes mellitus without complication ([place])    no longer on meds since weight loss  . Hypertension   . Hypertensive cardiovascular disease   . Hypothyroidism   . Obesity   . Persistent atrial fibrillation ([place])   . Pneumonia   . Postpartum cardiomyopathy    Last echocardiogram was last year and EF 45 -50 %  . Seasonal allergies   . Sleep apnea    complaint with CPAP  . Ventricular tachycardia (HCC)    s/p ICD Implantation (MDT Secura VR)    Family History  Problem Relation Age of Onset  . Other Mother        has chronic pain syndrome from DDD with spinal cord stimulator  . Hypertension Mother   . COPD Father   . Arthritis/Rheumatoid Father     Past Surgical History:  Procedure Laterality Date  . CARDIAC CATHETERIZATION N/A 04/11/2016   Procedure: Right/Left Heart Cath and Coronary Angiography;  Surgeon: Larey Dresser, MD;  Location: [facility];  Service: Cardiovascular;  Laterality: N/A;  . CARDIAC DEFIBRILLATOR PLACEMENT  08/12/2000, 11/11/2008   Most recent Generator MDT Secura VR placed in [place]  . CESAREAN SECTION  1996  . CHOLECYSTECTOMY  2004  . GASTRIC BYPASS  05/2017  . ICD GENERATOR CHANGEOUT  03/02/2018  . ICD GENERATOR CHANGEOUT N/A 03/02/2018   Procedure: ICD GENERATOR CHANGEOUT;  Surgeon: Evans Lance, MD;  Location: [facility];  Service:  Cardiovascular;  Laterality: N/A;  . IR GENERIC HISTORICAL  06/13/2014   IR RADIOLOGIST EVAL & MGMT 06/13/2014 Greggory Keen, MD [facility] INTER[facility] RAD  . IR GENERIC HISTORICAL  04/09/2016   IR FLUORO GUIDE CV LINE LEFT 04/09/2016 Greggory Keen, MD [facility]  . IR GENERIC HISTORICAL  04/09/2016   IR US GUIDE VASC ACCESS RIGHT 04/09/2016 Greggory Keen, MD [facility]  . IR GENERIC HISTORICAL  04/09/2016   IR VENO/EXT/UNI RIGHT 04/09/2016 Greggory Keen, MD [facility]  . IR GENERIC HISTORICAL  04/09/2016   IR US GUIDE VASC ACCESS LEFT 04/09/2016 Greggory Keen, MD [facility]  . KNEE ARTHROSCOPY Right 11/30/2018   Procedure: right knee arthroscopy with debridement;  Surgeon: Meredith Pel, MD;  Location: [facility];  Service: Orthopedics;  Laterality: Right;  . LEAD REVISION/REPAIR N/A 03/02/2018   Procedure: LEAD REVISION/REPAIR;  Surgeon: Evans Lance, MD;  Location: [facility];  Service: Cardiovascular;  Laterality: N/A;  . TEE WITHOUT CARDIOVERSION N/A 04/08/2016   Procedure: TRANSESOPHAGEAL ECHOCARDIOGRAM (TEE);  Surgeon: Pixie Casino, MD;  [facility];  Service: Cardiovascular;  Laterality: N/A;   Social History   Occupational History  . Not on file  Tobacco Use  . Smoking status: Never Smoker  . Smokeless tobacco: Never Used  Substance and Sexual Activity  . Alcohol use: No    Alcohol/week: 0.0 standard drinks  . Drug use: No  . Sexual activity: Not on file

## 2019-01-18 ENCOUNTER — Ambulatory Visit (INDEPENDENT_AMBULATORY_CARE_PROVIDER_SITE_OTHER): Payer: Medicare Other | Admitting: *Deleted

## 2019-01-18 DIAGNOSIS — I429 Cardiomyopathy, unspecified: Secondary | ICD-10-CM | POA: Diagnosis not present

## 2019-01-18 DIAGNOSIS — I472 Ventricular tachycardia, unspecified: Secondary | ICD-10-CM

## 2019-01-19 ENCOUNTER — Ambulatory Visit (INDEPENDENT_AMBULATORY_CARE_PROVIDER_SITE_OTHER): Payer: Medicare Other

## 2019-01-19 DIAGNOSIS — Z95810 Presence of automatic (implantable) cardiac defibrillator: Secondary | ICD-10-CM | POA: Diagnosis not present

## 2019-01-19 DIAGNOSIS — I5022 Chronic systolic (congestive) heart failure: Secondary | ICD-10-CM | POA: Diagnosis not present

## 2019-01-19 LAB — CUP PACEART REMOTE DEVICE CHECK
Battery Remaining Longevity: 131 mo
Battery Voltage: 3.04 V
Brady Statistic RV Percent Paced: 0.14 %
Date Time Interrogation Session: 20201020083823
HighPow Impedance: 47 Ohm
Implantable Lead Implant Date: 20191203
Implantable Lead Location: 753860
Implantable Pulse Generator Implant Date: 20191203
Lead Channel Impedance Value: 304 Ohm
Lead Channel Impedance Value: 418 Ohm
Lead Channel Pacing Threshold Amplitude: 0.875 V
Lead Channel Pacing Threshold Pulse Width: 0.4 ms
Lead Channel Sensing Intrinsic Amplitude: 7.25 mV
Lead Channel Sensing Intrinsic Amplitude: 7.25 mV
Lead Channel Setting Pacing Amplitude: 2.5 V
Lead Channel Setting Pacing Pulse Width: 0.4 ms
Lead Channel Setting Sensing Sensitivity: 0.3 mV

## 2019-01-21 ENCOUNTER — Telehealth: Payer: Self-pay

## 2019-01-21 NOTE — Progress Notes (Signed)
Spoke with patient and she is asymptomatic.  She denies fluid symptoms.  She has drinking more fluids as recommended and is no longer on Torsemide.  Weight today is 183 lbs.  Advised will recheck fluid levels on 01/25/2019 and requested she send a manual transmission.

## 2019-01-21 NOTE — Telephone Encounter (Signed)
Remote ICM transmission received.  Attempted call to patient regarding ICM remote transmission and left message to return call   

## 2019-01-21 NOTE — Progress Notes (Signed)
EPIC Encounter for ICM Monitoring  Patient Name: Erin Jarvis is a 53 y.o. female Date: 01/21/2019 Primary Care Physican: Patient, No Pcp Per Primary Cardiologist:Hochrein/McLean Electrophysiologist: Allred Nephrologist: Dr Hollie Salk at [facility]       Attempted call to patient and unable to reach.  Left message to return call. Transmission reviewed.   OptiVolThoracic impedance suggestive of possible ongoing fluid accumulation since 12/12/2018.  Prescribed:Torsemide stopped on 12/10/2018 as noted in lab result note.   Labs: 10/18/2018 Creatinine1.55, Satira Mccallum [id number], [id number] 10/08/2018 Creatinine2.39, BUN31, Potassium4.0, Sodium135, [id number]  05/28/2018 Creatinine1.70, Doneta Public, Potassium3.6, [id number], [id number] 05/18/2018 Creatinine1.88, BUN24, Potassium3.8, Sodium140, [id number]  A complete set of results can be found in Results Review.  Recommendations: Unable to reach.    Follow-up plan: ICM clinic phone appointment on 01/25/2019 (manual send) to recheck fluid levels.   91 day device clinic remote transmission 04/19/2019.    Copy of ICM check sent to Dr. Rayann Heman and Dr Aundra Dubin for review.   3 month ICM trend: 01/18/2019    1 Year ICM trend:       [name], RN 01/21/2019 1:20 PM

## 2019-01-23 NOTE — Progress Notes (Signed)
She needs to restart torsemide 20 mg daily with BMET in 1 week.

## 2019-01-24 MED ORDER — TORSEMIDE 20 MG PO TABS: 20.0000 mg | ORAL_TABLET | Freq: Every day | ORAL | 3 refills | Status: DC

## 2019-01-24 NOTE — Progress Notes (Addendum)
Spoke with patient.  Advised of Dr Claris Gladden recommendation to restart Torsemide 20 mg daily and have labs drawn in a week.  She has Torsemide supply on hand and will call when ready for refill.  She will have labs drawn at [facility] on 01/31/2019.  Remote transmission rescheduled to 01/31/2019.

## 2019-01-24 NOTE — Progress Notes (Signed)
Attempted call to patient and left message to return call.  °

## 2019-01-31 ENCOUNTER — Other Ambulatory Visit (HOSPITAL_COMMUNITY)
Admission: RE | Admit: 2019-01-31 | Discharge: 2019-01-31 | Disposition: A | Discharge status: Home / Self Care | Payer: Medicare Other | Source: Ambulatory Visit | Attending: Cardiology | Admitting: Cardiology

## 2019-01-31 DIAGNOSIS — Z95810 Presence of automatic (implantable) cardiac defibrillator: Secondary | ICD-10-CM | POA: Diagnosis present

## 2019-01-31 DIAGNOSIS — I5022 Chronic systolic (congestive) heart failure: Secondary | ICD-10-CM | POA: Diagnosis present

## 2019-01-31 LAB — BASIC METABOLIC PANEL
Anion gap: 14 (ref 5–15)
BUN: 38 mg/dL — ABNORMAL HIGH (ref 6–20)
CO2: 21 mmol/L — ABNORMAL LOW (ref 22–32)
Calcium: 9.6 mg/dL (ref 8.9–10.3)
Chloride: 103 mmol/L (ref 98–111)
Creatinine, Ser: 1.8 mg/dL — ABNORMAL HIGH (ref 0.44–1.00)
GFR calc Af Amer: 36 mL/min — ABNORMAL LOW (ref >60)
GFR calc non Af Amer: 31 mL/min — ABNORMAL LOW (ref >60)
Glucose, Bld: 131 mg/dL — ABNORMAL HIGH (ref 70–99)
Potassium: 3.9 mmol/L (ref 3.5–5.1)
Sodium: 138 mmol/L (ref 135–145)

## 2019-02-01 ENCOUNTER — Telehealth: Payer: Self-pay

## 2019-02-01 NOTE — Telephone Encounter (Signed)
Left message for patient to remind of missed remote transmission.  

## 2019-02-03 NOTE — Progress Notes (Signed)
Remote ICD transmission.   

## 2019-02-04 NOTE — Progress Notes (Signed)
No ICM remote transmission received for 01/31/2019 and next ICM transmission scheduled for 02/28/2019.

## 2019-02-14 ENCOUNTER — Ambulatory Visit (HOSPITAL_COMMUNITY)
Admission: RE | Admit: 2019-02-14 | Discharge: 2019-02-14 | Disposition: A | Discharge status: Home / Self Care | Payer: Medicare Other | Source: Ambulatory Visit | Attending: Internal Medicine | Admitting: Internal Medicine

## 2019-02-14 ENCOUNTER — Other Ambulatory Visit: Payer: Self-pay

## 2019-02-14 ENCOUNTER — Other Ambulatory Visit (HOSPITAL_COMMUNITY): Payer: Self-pay | Admitting: *Deleted

## 2019-02-14 DIAGNOSIS — I5022 Chronic systolic (congestive) heart failure: Secondary | ICD-10-CM

## 2019-02-14 LAB — BASIC METABOLIC PANEL
Anion gap: 13 (ref 5–15)
BUN: 25 mg/dL — ABNORMAL HIGH (ref 6–20)
CO2: 18 mmol/L — ABNORMAL LOW (ref 22–32)
Calcium: 9.5 mg/dL (ref 8.9–10.3)
Chloride: 108 mmol/L (ref 98–111)
Creatinine, Ser: 1.42 mg/dL — ABNORMAL HIGH (ref 0.44–1.00)
GFR calc Af Amer: 48 mL/min — ABNORMAL LOW (ref >60)
GFR calc non Af Amer: 41 mL/min — ABNORMAL LOW (ref >60)
Glucose, Bld: 106 mg/dL — ABNORMAL HIGH (ref 70–99)
Potassium: 3.6 mmol/L (ref 3.5–5.1)
Sodium: 139 mmol/L (ref 135–145)

## 2019-02-28 ENCOUNTER — Ambulatory Visit (INDEPENDENT_AMBULATORY_CARE_PROVIDER_SITE_OTHER): Payer: Medicare Other

## 2019-02-28 DIAGNOSIS — Z95810 Presence of automatic (implantable) cardiac defibrillator: Secondary | ICD-10-CM

## 2019-02-28 DIAGNOSIS — I5022 Chronic systolic (congestive) heart failure: Secondary | ICD-10-CM | POA: Diagnosis not present

## 2019-03-02 NOTE — Progress Notes (Signed)
EPIC Encounter for ICM Monitoring  Patient Name: Erin Jarvis is a 53 y.o. female Date: 03/02/2019 Primary Care Physican: Patient, No Pcp Per Primary Cardiologist:Hochrein/McLean Electrophysiologist: Allred Nephrologist: Dr Hollie Salk at [place] [facility]       Transmission reviewed.   OptiVolThoracic impedancetrending just below baseline.  Prescribed:Torsemidestopped on 12/10/2018 as noted in lab result note.   Labs: 02/14/2019 Creatinine 1.42, BUN 25, Potassium 3.6, Sodium 139, GFR 41-48 01/31/2019 Creatinine 1.80, BUN 38, Potassium 3.9, Sodium 138, GFR 31-36  12/21/2018 Creatinine 1.24, BUN 15, Potassium 3.7, Sodium 143, GFR 49-57  12/10/2018 Creatinine 1.80, BUN 45, Potassium 3.5, Sodium 140, GFR 31-36  11/26/2018 Creatinine 1.02, BUN 12, Potassium 3.7, Sodium 142, GFR >60  10/18/2018 Creatinine1.55, BUN13, Potassium3.8, Sodium133, [id number] 10/08/2018 Creatinine2.39, BUN31, Potassium4.0, Sodium135, GFR22-26  05/28/2018 Creatinine1.70, BUN22, Potassium3.6, Sodium139, [id number] 05/18/2018 Creatinine1.88, BUN24, Potassium3.8, Sodium140, [id number]  A complete set of results can be found in Results Review.  Recommendations: None   Follow-up plan: ICM clinic phone appointment on 04/11/2019.   91 day device clinic remote transmission 04/19/2019.    Copy of ICM check sent to Dr. Rayann Heman.   3 month ICM trend: 02/28/2019    1 Year ICM trend:       [name], RN 03/02/2019 2:02 PM

## 2019-03-14 ENCOUNTER — Other Ambulatory Visit (HOSPITAL_COMMUNITY): Payer: Self-pay

## 2019-03-14 MED ORDER — METOPROLOL SUCCINATE ER 100 MG PO TB24: 100.0000 mg | ORAL_TABLET | Freq: Two times a day (BID) | ORAL | 1 refills | Status: DC

## 2019-04-11 ENCOUNTER — Ambulatory Visit (INDEPENDENT_AMBULATORY_CARE_PROVIDER_SITE_OTHER): Payer: Medicare Other

## 2019-04-11 DIAGNOSIS — Z95810 Presence of automatic (implantable) cardiac defibrillator: Secondary | ICD-10-CM

## 2019-04-11 DIAGNOSIS — I5022 Chronic systolic (congestive) heart failure: Secondary | ICD-10-CM | POA: Diagnosis not present

## 2019-04-13 ENCOUNTER — Telehealth: Payer: Self-pay

## 2019-04-13 NOTE — Progress Notes (Signed)
EPIC Encounter for ICM Monitoring  Patient Name: Erin Jarvis is a 54 y.o. female Date: 04/13/2019 Primary Care Physican: Patient, No Pcp Per Primary Cardiologist:Hochrein/McLean Electrophysiologist: Allred Nephrologist: Dr Hollie Salk at [place]      Attempted call to patient and unable to reach.  Left detailed message per DPR regarding transmission. Transmission reviewed.   OptiVolThoracic impedancenormal but does have a pattern of trending below baseline since 03/06/2019.  Prescribed:Torsemide 20 mg take 1 tablet by mouth daily.  Labs: 02/14/2019 Creatinine 1.42, BUN 25, Potassium 3.6, Sodium 139, GFR 41-48 01/31/2019 Creatinine 1.80, BUN 38, Potassium 3.9, Sodium 138, GFR 31-36  12/21/2018 Creatinine 1.24, BUN 15, Potassium 3.7, Sodium 143, GFR 49-57  12/10/2018 Creatinine 1.80, BUN 45, Potassium 3.5, Sodium 140, GFR 31-36  11/26/2018 Creatinine 1.02, BUN 12, Potassium 3.7, Sodium 142, GFR >60  10/18/2018 Creatinine1.55, BUN13, Potassium3.8, Sodium133, [id number] 10/08/2018 Creatinine2.39, BUN31, Potassium4.0, Sodium135, GFR22-26  05/28/2018 Creatinine1.70, BUN22, Potassium3.6, Sodium139, [id number] 05/18/2018 Creatinine1.88, BUN24, Potassium3.8, Sodium140, [id number]  A complete set of results can be found in Results Review.  Recommendations:Left voice mail with ICM number and encouraged to call if experiencing any fluid symptoms.  Follow-up plan: ICM clinic phone appointment on2/15/2021. 91 day device clinic remote transmission 04/19/2019.   Copy of ICM check sent toDr. Allred.   3 month ICM trend: 04/11/2019    1 Year ICM trend:       [name], RN 04/13/2019 9:59 AM

## 2019-04-13 NOTE — Telephone Encounter (Signed)
Remote ICM transmission received.  Attempted call to patient regarding ICM remote transmission and left detailed message per DPR.  Advised to return call for any fluid symptoms or questions. Next ICM remote transmission scheduled 05/16/2019.     

## 2019-04-19 ENCOUNTER — Ambulatory Visit (INDEPENDENT_AMBULATORY_CARE_PROVIDER_SITE_OTHER): Payer: Medicare Other | Admitting: *Deleted

## 2019-04-19 DIAGNOSIS — I429 Cardiomyopathy, unspecified: Secondary | ICD-10-CM | POA: Diagnosis not present

## 2019-04-19 LAB — CUP PACEART REMOTE DEVICE CHECK
Battery Remaining Longevity: 129 mo
Battery Voltage: 3.03 V
Brady Statistic RV Percent Paced: 0.8 %
Date Time Interrogation Session: 20210119043623
HighPow Impedance: 55 Ohm
Implantable Lead Implant Date: 20191203
Implantable Lead Location: 753860
Implantable Pulse Generator Implant Date: 20191203
Lead Channel Impedance Value: 304 Ohm
Lead Channel Impedance Value: 361 Ohm
Lead Channel Pacing Threshold Amplitude: 0.75 V
Lead Channel Pacing Threshold Pulse Width: 0.4 ms
Lead Channel Sensing Intrinsic Amplitude: 8.75 mV
Lead Channel Sensing Intrinsic Amplitude: 8.75 mV
Lead Channel Setting Pacing Amplitude: 2.5 V
Lead Channel Setting Pacing Pulse Width: 0.4 ms
Lead Channel Setting Sensing Sensitivity: 0.3 mV

## 2019-05-02 ENCOUNTER — Other Ambulatory Visit: Payer: Self-pay

## 2019-05-02 MED ORDER — SPIRONOLACTONE 25 MG PO TABS: 25.0000 mg | ORAL_TABLET | Freq: Every day | ORAL | 0 refills | Status: DC

## 2019-05-16 ENCOUNTER — Ambulatory Visit (INDEPENDENT_AMBULATORY_CARE_PROVIDER_SITE_OTHER): Payer: Medicare Other

## 2019-05-16 DIAGNOSIS — I5022 Chronic systolic (congestive) heart failure: Secondary | ICD-10-CM | POA: Diagnosis not present

## 2019-05-16 DIAGNOSIS — Z95810 Presence of automatic (implantable) cardiac defibrillator: Secondary | ICD-10-CM | POA: Diagnosis not present

## 2019-05-20 ENCOUNTER — Telehealth: Payer: Self-pay

## 2019-05-20 NOTE — Progress Notes (Signed)
EPIC Encounter for ICM Monitoring  Patient Name: Erin Jarvis is a 54 y.o. female Date: 05/20/2019 Primary Care Physican: Patient, No Pcp Per Primary Cardiologist:Hochrein/McLean Electrophysiologist: Allred Nephrologist: Dr Hollie Salk at [place]      Attempted call to patient and unable to reach.  Left detailed message per DPR regarding transmission. Transmission reviewed.   OptiVolThoracic impedancenormal.  Prescribed:Torsemide 20 mg take 1 tablet by mouth daily.  Labs: 02/14/2019 Creatinine1.42[name] [id number], [id number] 01/31/2019 Creatinine1.80, BUN38, Potassium3.9, [id number], [id number]  A complete set of results can be found in Results Review.  Recommendations:Left voice mail with ICM number and encouraged to call if experiencing any fluid symptoms.  Follow-up plan: ICM clinic phone appointment on3/22/2021. 91 day device clinic remote transmission 07/19/2019.   Copy of ICM check sent toDr. Allred.   3 month ICM trend: 05/16/2019    1 Year ICM trend:       Erin Billings, RN 05/20/2019 11:07 AM

## 2019-05-20 NOTE — Telephone Encounter (Signed)
Remote ICM transmission received.  Attempted call to patient regarding ICM remote transmission and left detailed message per DPR.  Advised to return call for any fluid symptoms or questions. Next ICM remote transmission scheduled 06/20/2019.

## 2019-06-14 ENCOUNTER — Other Ambulatory Visit: Payer: Self-pay | Admitting: Internal Medicine

## 2019-06-20 ENCOUNTER — Ambulatory Visit (INDEPENDENT_AMBULATORY_CARE_PROVIDER_SITE_OTHER): Payer: Medicare Other

## 2019-06-20 DIAGNOSIS — Z95810 Presence of automatic (implantable) cardiac defibrillator: Secondary | ICD-10-CM | POA: Diagnosis not present

## 2019-06-20 DIAGNOSIS — I5022 Chronic systolic (congestive) heart failure: Secondary | ICD-10-CM

## 2019-06-24 ENCOUNTER — Telehealth: Payer: Self-pay

## 2019-06-24 NOTE — Telephone Encounter (Signed)
Remote ICM transmission received.  Attempted call to patient regarding ICM remote transmission and left detailed message per DPR.  Advised to return call for any fluid symptoms or questions. Next ICM remote transmission scheduled 07/26/2019.     

## 2019-06-24 NOTE — Progress Notes (Signed)
EPIC Encounter for ICM Monitoring  Patient Name: Erin Jarvis is a 54 y.o. female Date: 06/24/2019 Primary Care Physican: Patient, No Pcp Per Primary Cardiologist:Hochrein/McLean Electrophysiologist: Allred Nephrologist: Erin Jarvis at [facility] 05/30/2019 Office Weight:180lbs      Attempted call to patient and unable to reach. Left detailed message per DPR regarding transmission. Transmission reviewed.  OptiVolThoracic impedancenormal.  Prescribed:Torsemide20 mg take 1 tablet by mouth daily.  Labs: 02/14/2019 Creatinine1.42[name] [id number], [id number] 01/31/2019 Creatinine1.80, BUN38, Potassium3.9, [id number], [id number]  A complete set of results can be found in Results Review.  Recommendations:Left voice mail with ICM number and encouraged to call if experiencing any fluid symptoms.  Follow-up plan: ICM clinic phone appointment on4/27/2021. 91 day device clinic remote transmission 07/19/2019.   Copy of ICM check sent toDr. Allred.  3 month ICM trend: 06/20/2019    1 Year ICM trend:      Erin Billings, RN 06/24/2019 12:25 PM

## 2019-07-19 ENCOUNTER — Ambulatory Visit (INDEPENDENT_AMBULATORY_CARE_PROVIDER_SITE_OTHER): Payer: Medicare Other | Admitting: *Deleted

## 2019-07-19 DIAGNOSIS — I429 Cardiomyopathy, unspecified: Secondary | ICD-10-CM

## 2019-07-22 LAB — CUP PACEART REMOTE DEVICE CHECK
Battery Remaining Longevity: 127 mo
Battery Voltage: 3.02 V
Brady Statistic RV Percent Paced: 0.19 %
Date Time Interrogation Session: 20210422171736
HighPow Impedance: 54 Ohm
Implantable Lead Implant Date: 20191203
Implantable Lead Location: 753860
Implantable Pulse Generator Implant Date: 20191203
Lead Channel Impedance Value: 285 Ohm
Lead Channel Impedance Value: 361 Ohm
Lead Channel Pacing Threshold Amplitude: 0.875 V
Lead Channel Pacing Threshold Pulse Width: 0.4 ms
Lead Channel Sensing Intrinsic Amplitude: 6.875 mV
Lead Channel Sensing Intrinsic Amplitude: 6.875 mV
Lead Channel Setting Pacing Amplitude: 2.5 V
Lead Channel Setting Pacing Pulse Width: 0.4 ms
Lead Channel Setting Sensing Sensitivity: 0.3 mV

## 2019-07-22 NOTE — Progress Notes (Signed)
ICD Remote  

## 2019-07-26 ENCOUNTER — Ambulatory Visit (INDEPENDENT_AMBULATORY_CARE_PROVIDER_SITE_OTHER): Payer: Medicare Other

## 2019-07-26 DIAGNOSIS — Z95810 Presence of automatic (implantable) cardiac defibrillator: Secondary | ICD-10-CM | POA: Diagnosis not present

## 2019-07-26 DIAGNOSIS — I5022 Chronic systolic (congestive) heart failure: Secondary | ICD-10-CM

## 2019-07-28 ENCOUNTER — Other Ambulatory Visit: Payer: Self-pay | Admitting: Internal Medicine

## 2019-07-29 ENCOUNTER — Telehealth: Payer: Self-pay

## 2019-07-29 NOTE — Progress Notes (Signed)
EPIC Encounter for ICM Monitoring  Patient Name: Erin Jarvis is a 54 y.o. female Date: 07/29/2019 Primary Care Physican: Patient, No Pcp Per Primary Cardiologist:Hochrein/McLean Electrophysiologist: Allred Nephrologist: Dr Hollie Salk at [facility] 05/30/2019 Office Weight:180lbs      Attempted call to patient and unable to reach. Left detailed message per DPR regarding transmission. Transmission reviewed.  OptiVolThoracic impedancenormal.  Prescribed:Torsemide20 mg take 1 tablet by mouth daily.  Labs: 02/14/2019 Creatinine1.42[name] [id number], [id number] 01/31/2019 Creatinine1.80, BUN38, Potassium3.9, [name], [name]  A complete set of results can be found in Results Review.  Recommendations:Left voice mail with ICM number and encouraged to call if experiencing any fluid symptoms.  Follow-up plan: ICM clinic phone appointment on6/04/2019. 91 day device clinic remote transmission7/20/2021.   Copy of ICM check sent toDr. Allred.  3 month ICM trend: 07/26/2019    1 Year ICM trend:       [name], RN 07/29/2019 1:12 PM

## 2019-07-29 NOTE — Telephone Encounter (Signed)
Remote ICM transmission received.  Attempted call to patient regarding ICM remote transmission and left detailed message per DPR.  Advised to return call for any fluid symptoms or questions.  

## 2019-08-17 ENCOUNTER — Other Ambulatory Visit (HOSPITAL_COMMUNITY): Payer: Self-pay

## 2019-08-17 MED ORDER — LEVOTHYROXINE SODIUM 25 MCG PO TABS: 25.0000 ug | ORAL_TABLET | Freq: Every day | ORAL | 2 refills | Status: DC

## 2019-08-17 MED ORDER — METOPROLOL SUCCINATE ER 100 MG PO TB24: 100.0000 mg | ORAL_TABLET | Freq: Two times a day (BID) | ORAL | 2 refills | Status: DC

## 2019-08-30 ENCOUNTER — Ambulatory Visit (INDEPENDENT_AMBULATORY_CARE_PROVIDER_SITE_OTHER): Payer: Medicare Other

## 2019-08-30 ENCOUNTER — Telehealth: Payer: Self-pay

## 2019-08-30 DIAGNOSIS — I5022 Chronic systolic (congestive) heart failure: Secondary | ICD-10-CM

## 2019-08-30 DIAGNOSIS — Z95810 Presence of automatic (implantable) cardiac defibrillator: Secondary | ICD-10-CM | POA: Diagnosis not present

## 2019-08-30 NOTE — Progress Notes (Signed)
EPIC Encounter for ICM Monitoring  Patient Name: Erin Jarvis is a 54 y.o. female Date: 08/30/2019 Primary Care Physican: Patient, No Pcp Per Primary Cardiologist:Hochrein/McLean Electrophysiologist: Allred Nephrologist: Dr Hollie Salk at [facility] 05/30/2019 OfficeWeight:180lbs      Attempted call to patient and unable to reach. Left detailed message per DPR regarding transmission. Transmission reviewed.  OptiVolThoracic impedancenormal.  Prescribed:Torsemide20 mg take 1 tablet by mouth daily.  Labs: 02/14/2019 Creatinine1.42[name] [id number], [id number] 01/31/2019 Creatinine1.80, BUN38, Potassium3.9, [name], [name]  A complete set of results can be found in Results Review.  Recommendations:Left voice mail with ICM number and encouraged to call if experiencing any fluid symptoms.  Follow-up plan: ICM clinic phone appointment on7/08/2019. 91 day device clinic remote transmission7/20/2021.   Copy of ICM check sent toDr. Allred.  3 month ICM trend: 08/30/2019    1 Year ICM trend:       [name], RN 08/30/2019 4:41 PM

## 2019-08-30 NOTE — Telephone Encounter (Signed)
Remote ICM transmission received.  Attempted call to patient regarding ICM remote transmission and left detailed message per DPR.  Advised to return call for any fluid symptoms or questions. Next ICM remote transmission scheduled 10/04/2019.

## 2019-09-23 MED FILL — PANTOPRAZOLE SOD DR 40 MG T: 40 | 30 days supply | Qty: 30 | Fill #1

## 2019-09-29 MED FILL — PANTOPRAZOLE SOD DR 40 MG T: 40 | 30 days supply | Qty: 30 | Fill #1

## 2019-10-04 ENCOUNTER — Ambulatory Visit (INDEPENDENT_AMBULATORY_CARE_PROVIDER_SITE_OTHER): Payer: Medicare Other

## 2019-10-04 DIAGNOSIS — Z95810 Presence of automatic (implantable) cardiac defibrillator: Secondary | ICD-10-CM | POA: Diagnosis not present

## 2019-10-04 DIAGNOSIS — I5022 Chronic systolic (congestive) heart failure: Secondary | ICD-10-CM | POA: Diagnosis not present

## 2019-10-05 ENCOUNTER — Telehealth: Payer: Self-pay

## 2019-10-05 NOTE — Progress Notes (Signed)
EPIC Encounter for ICM Monitoring  Patient Name: Erin Jarvis is a 54 y.o. female Date: 10/05/2019 Primary Care Physican: Patient, No Pcp Per Primary Cardiologist:Hochrein/McLean Electrophysiologist: Allred Nephrologist: Dr Hollie Salk at [facility] 05/30/2019 OfficeWeight:180lbs      Attempted call to patient and unable to reach. Left detailed message per DPR regarding transmission. Transmission reviewed.  OptiVolThoracic impedancenormal.  Prescribed:Torsemide20 mg take 1 tablet by mouth daily.  Labs: 02/14/2019 Creatinine1.42[name] [id number], [id number] 01/31/2019 Creatinine1.80, BUN38, Potassium3.9, [name], [name]  A complete set of results can be found in Results Review.  Recommendations:Left voice mail with ICM number and encouraged to call if experiencing any fluid symptoms.  Follow-up plan: ICM clinic phone appointment on8/11/2019. 91 day device clinic remote transmission7/20/2021.   Copy of ICM check sent toDr. Allred.  3 month ICM trend: 10/04/2019    1 Year ICM trend:       [name], RN 10/05/2019 1:38 PM

## 2019-10-05 NOTE — Telephone Encounter (Signed)
Remote ICM transmission received.  Attempted call to patient regarding ICM remote transmission and left detailed message per DPR.  Advised to return call for any fluid symptoms or questions. Next ICM remote transmission scheduled 11/07/2019.     

## 2019-10-18 ENCOUNTER — Ambulatory Visit (INDEPENDENT_AMBULATORY_CARE_PROVIDER_SITE_OTHER): Payer: Medicare Other | Admitting: *Deleted

## 2019-10-18 DIAGNOSIS — I429 Cardiomyopathy, unspecified: Secondary | ICD-10-CM

## 2019-10-18 LAB — CUP PACEART REMOTE DEVICE CHECK
Battery Remaining Longevity: 125 mo
Battery Voltage: 3.02 V
Brady Statistic RV Percent Paced: 0.25 %
Date Time Interrogation Session: 20210720022724
HighPow Impedance: 57 Ohm
Implantable Lead Implant Date: 20191203
Implantable Lead Location: 753860
Implantable Pulse Generator Implant Date: 20191203
Lead Channel Impedance Value: 266 Ohm
Lead Channel Impedance Value: 342 Ohm
Lead Channel Pacing Threshold Amplitude: 0.75 V
Lead Channel Pacing Threshold Pulse Width: 0.4 ms
Lead Channel Sensing Intrinsic Amplitude: 9 mV
Lead Channel Sensing Intrinsic Amplitude: 9 mV
Lead Channel Setting Pacing Amplitude: 2.5 V
Lead Channel Setting Pacing Pulse Width: 0.4 ms
Lead Channel Setting Sensing Sensitivity: 0.3 mV

## 2019-10-20 NOTE — Progress Notes (Signed)
Remote ICD transmission.   

## 2019-11-07 ENCOUNTER — Ambulatory Visit: Payer: Medicare Other

## 2019-11-07 DIAGNOSIS — Z95810 Presence of automatic (implantable) cardiac defibrillator: Secondary | ICD-10-CM

## 2019-11-07 DIAGNOSIS — I5022 Chronic systolic (congestive) heart failure: Secondary | ICD-10-CM

## 2019-11-08 ENCOUNTER — Telehealth: Payer: Self-pay

## 2019-11-08 NOTE — Progress Notes (Signed)
EPIC Encounter for ICM Monitoring  Patient Name: Erin Jarvis is a 54 y.o. female Date: 11/08/2019 Primary Care Physican: Patient, No Pcp Per Primary Cardiologist:Hochrein/McLean Electrophysiologist: Allred Nephrologist: Dr Hollie Salk at [facility] 05/30/2019 OfficeWeight:180lbs      Attempted call to patient and unable to reach. Left detailed message per DPR regarding transmission. Transmission reviewed.  OptiVolThoracic impedancenormal.  Prescribed:Torsemide20 mg take 1 tablet by mouth daily.  Labs: 02/14/2019 Creatinine1.42[name] [id number], [id number] 01/31/2019 Creatinine1.80, BUN38, Potassium3.9, [name], [name]  A complete set of results can be found in Results Review.  Recommendations:Left voice mail with ICM number and encouraged to call if experiencing any fluid symptoms.  Follow-up plan: ICM clinic phone appointment on9/13/2021. 91 day device clinic remote transmission10/19/2021.   EP/Cardiology Next Visit: Recall for 02/05/2020 with Dr Aundra Dubin and 02/27/2020 with Dr Lovena Le  Copy of ICM check sent toDr. Rayann Heman.  3 month ICM trend: 11/07/2019    1 Year ICM trend:       [name], RN 11/08/2019 4:34 PM

## 2019-11-08 NOTE — Telephone Encounter (Signed)
Remote ICM transmission received.  Attempted call to patient regarding ICM remote transmission and left detailed message per DPR.  Advised to return call for any fluid symptoms or questions. Next ICM remote transmission scheduled 12/12/2019.     

## 2019-12-12 ENCOUNTER — Ambulatory Visit (INDEPENDENT_AMBULATORY_CARE_PROVIDER_SITE_OTHER): Payer: Medicare Other

## 2019-12-12 DIAGNOSIS — I5022 Chronic systolic (congestive) heart failure: Secondary | ICD-10-CM

## 2019-12-12 DIAGNOSIS — Z95810 Presence of automatic (implantable) cardiac defibrillator: Secondary | ICD-10-CM | POA: Diagnosis not present

## 2019-12-14 NOTE — Progress Notes (Signed)
EPIC Encounter for ICM Monitoring  Patient Name: Erin Jarvis is a 54 y.o. female Date: 12/14/2019 Primary Care Physican: Patient, No Pcp Per Primary Cardiologist:Hochrein/McLean Electrophysiologist: Allred Nephrologist: Dr Hollie Salk at [facility] 05/30/2019 OfficeWeight:180lbs      Transmission reviewed.  OptiVolThoracic impedancenormal.  Prescribed:Torsemide20 mg take 1 tablet by mouth daily.  Labs: 02/14/2019 Creatinine1.42[name] [id number], [id number] 01/31/2019 Creatinine1.80, BUN38, Potassium3.9, [name], [name]  A complete set of results can be found in Results Review.  Recommendations:No changes.  Follow-up plan: ICM clinic phone appointment on10/20/2021. 91 day device clinic remote transmission10/19/2021.   EP/Cardiology Next Visit: Recall for 02/05/2020 with Dr Aundra Dubin and 02/27/2020 with Dr Lovena Le  Copy of ICM check sent toDr. Rayann Heman.  3 month ICM trend: 12/12/2019    1 Year ICM trend:       Erin Billings, RN 12/14/2019 1:30 PM

## 2020-01-06 ENCOUNTER — Telehealth (HOSPITAL_COMMUNITY): Payer: Self-pay | Admitting: Cardiology

## 2020-01-06 MED ORDER — LEVOTHYROXINE SODIUM 25 MCG PO TABS: 25.0000 ug | ORAL_TABLET | Freq: Every day | ORAL | 2 refills | Status: DC

## 2020-01-06 MED ORDER — APIXABAN 5 MG PO TABS: 5.0000 mg | ORAL_TABLET | Freq: Two times a day (BID) | ORAL | 3 refills | Status: DC

## 2020-01-06 NOTE — Telephone Encounter (Signed)
Pt request levothyroxine and Eliquis refill pt scheduled appt 12/30 first available. Thanks

## 2020-01-17 ENCOUNTER — Ambulatory Visit (INDEPENDENT_AMBULATORY_CARE_PROVIDER_SITE_OTHER): Payer: Medicare Other

## 2020-01-17 DIAGNOSIS — I5022 Chronic systolic (congestive) heart failure: Secondary | ICD-10-CM

## 2020-01-17 DIAGNOSIS — I429 Cardiomyopathy, unspecified: Secondary | ICD-10-CM

## 2020-01-18 NOTE — Progress Notes (Unsigned)
No ICM remote transmission received for 01/18/2020 and next ICM transmission scheduled for 01/30/2020.

## 2020-01-19 LAB — CUP PACEART REMOTE DEVICE CHECK
Battery Remaining Longevity: 122 mo
Battery Voltage: 3.01 V
Brady Statistic RV Percent Paced: 0.28 %
Date Time Interrogation Session: 20211020211704
HighPow Impedance: 53 Ohm
Implantable Lead Implant Date: 20191203
Implantable Lead Location: 753860
Implantable Pulse Generator Implant Date: 20191203
Lead Channel Impedance Value: 266 Ohm
Lead Channel Impedance Value: 342 Ohm
Lead Channel Pacing Threshold Amplitude: 0.875 V
Lead Channel Pacing Threshold Pulse Width: 0.4 ms
Lead Channel Sensing Intrinsic Amplitude: 6.5 mV
Lead Channel Sensing Intrinsic Amplitude: 6.5 mV
Lead Channel Setting Pacing Amplitude: 2.5 V
Lead Channel Setting Pacing Pulse Width: 0.4 ms
Lead Channel Setting Sensing Sensitivity: 0.3 mV

## 2020-01-23 NOTE — Progress Notes (Signed)
Remote ICD transmission.   

## 2020-01-30 ENCOUNTER — Ambulatory Visit (INDEPENDENT_AMBULATORY_CARE_PROVIDER_SITE_OTHER): Payer: Medicare Other

## 2020-01-30 DIAGNOSIS — Z95810 Presence of automatic (implantable) cardiac defibrillator: Secondary | ICD-10-CM

## 2020-01-30 DIAGNOSIS — I5022 Chronic systolic (congestive) heart failure: Secondary | ICD-10-CM

## 2020-02-03 ENCOUNTER — Telehealth: Payer: Self-pay

## 2020-02-03 NOTE — Progress Notes (Signed)
EPIC Encounter for ICM Monitoring  Patient Name: Erin Jarvis is a 54 y.o. female Date: 02/03/2020 Primary Care Physican: Patient, No Pcp Per Primary Cardiologist:Erin Jarvis Electrophysiologist: Erin Jarvis Nephrologist: Erin Jarvis at [facility] 05/30/2019 OfficeWeight:180lbs      Attempted call to patient and unable to reach.  Left detailed message per Erin Jarvis regarding transmission. Transmission reviewed.   OptiVolThoracic impedancenormal.  Prescribed:Torsemide20 mg take 1 tablet by mouth daily.  Labs: 02/14/2019 Creatinine1.42[name] [id number], [name] 01/31/2019 Creatinine1.80, BUN38, Potassium3.9, [name], [name]  A complete set of results can be found in Results Review.  Recommendations:Left voice mail with ICM number and encouraged to call if experiencing any fluid symptoms.  Follow-up plan: ICM clinic phone appointment on12/08/2019. 91 day device clinic remote transmission10/19/2021.   EP/Cardiology Next Visit: 03/29/2020 with Erin Aundra Dubin.   Last visit with Erin Lovena Le was 06/01/2018. Recall for 02/27/2019 with Erin Lovena Le  Copy of ICM check sent toDr. Rayann Heman.   3 month ICM trend: 01/30/2020    1 Year ICM trend:       [name], RN 02/03/2020 2:28 PM

## 2020-02-03 NOTE — Telephone Encounter (Signed)
Remote ICM transmission received.  Attempted call to patient regarding ICM remote transmission and left detailed message per DPR.  Advised to return call for any fluid symptoms or questions. Next ICM remote transmission scheduled 03/05/2020.

## 2020-02-09 ENCOUNTER — Other Ambulatory Visit (HOSPITAL_COMMUNITY): Payer: Self-pay | Admitting: Cardiology

## 2020-03-05 ENCOUNTER — Ambulatory Visit (INDEPENDENT_AMBULATORY_CARE_PROVIDER_SITE_OTHER): Payer: Medicare Other

## 2020-03-05 ENCOUNTER — Telehealth: Payer: Self-pay

## 2020-03-05 DIAGNOSIS — I5022 Chronic systolic (congestive) heart failure: Secondary | ICD-10-CM

## 2020-03-05 DIAGNOSIS — Z95810 Presence of automatic (implantable) cardiac defibrillator: Secondary | ICD-10-CM | POA: Diagnosis not present

## 2020-03-05 NOTE — Progress Notes (Signed)
EPIC Encounter for ICM Monitoring  Patient Name: Erin Jarvis is a 54 y.o. female Date: 03/05/2020 Primary Care Physican: Patient, No Pcp Per Primary Cardiologist:Hochrein/McLean Electrophysiologist: Allred Nephrologist: Dr Hollie Salk at [place]      Attempted call to patient and unable to reach.  Left detailed message per DPR regarding transmission. Transmission reviewed.   OptiVolThoracic impedancetrending slightly below baseline normal.  Prescribed:Torsemide20 mg take 1 tablet by mouth daily.  Labs: 02/14/2019 Creatinine1.42[name] [id number], [id number] 01/31/2019 Creatinine1.80, BUN38, Potassium3.9, [name], [name]  A complete set of results can be found in Results Review.  Recommendations:Left voice mail with ICM number and encouraged to call if experiencing any fluid symptoms.  Follow-up plan: ICM clinic phone appointment on1/12/2020. 91 day device clinic remote transmission1/18/2022.   EP/Cardiology Next Visit: 03/29/2020 with Dr Aundra Dubin.   Last visit with Dr Lovena Le was 06/01/2018. Recall for 02/27/2019 with Dr Lovena Le  Copy of ICM check sent toDr. Rayann Heman.   3 month ICM trend: 03/05/2020    1 Year ICM trend:       [name], RN 03/05/2020 3:14 PM

## 2020-03-05 NOTE — Telephone Encounter (Signed)
Remote ICM transmission received.  Attempted call to patient regarding ICM remote transmission and left detailed message per DPR.  Advised to return call for any fluid symptoms or questions.  

## 2020-03-08 ENCOUNTER — Other Ambulatory Visit (HOSPITAL_COMMUNITY): Payer: Self-pay | Admitting: Cardiology

## 2020-03-29 ENCOUNTER — Encounter (HOSPITAL_COMMUNITY): Payer: Medicare Other | Admitting: Cardiology

## 2020-04-06 ENCOUNTER — Other Ambulatory Visit (HOSPITAL_COMMUNITY): Payer: Self-pay | Admitting: Cardiology

## 2020-04-09 ENCOUNTER — Ambulatory Visit (INDEPENDENT_AMBULATORY_CARE_PROVIDER_SITE_OTHER): Payer: Medicare Other

## 2020-04-09 DIAGNOSIS — I5022 Chronic systolic (congestive) heart failure: Secondary | ICD-10-CM

## 2020-04-09 DIAGNOSIS — Z95810 Presence of automatic (implantable) cardiac defibrillator: Secondary | ICD-10-CM | POA: Diagnosis not present

## 2020-04-09 NOTE — Progress Notes (Signed)
EPIC Encounter for ICM Monitoring  Patient Name: Erin Jarvis is a 55 y.o. female Date: 04/09/2020 Primary Care Physican: Patient, No Pcp Per Primary Cardiologist:Hochrein/McLean Electrophysiologist: Allred Nephrologist: Dr Hollie Salk at [facility] 04/09/2020 Weight:178lbs      Spoke with patient and is not experiencing any fluid symptoms at this time.  She has been eating soups for the last few days.   She also drinks low sodium V8 juice.  OptiVolThoracic impedancetrending slightly below baseline normal but trending back toward baseline.  Prescribed:Torsemide20 mg take 1 tablet by mouth daily. 04/09/2020 Patient reports she takes Torsemide as needed.  Labs: 02/14/2019 Creatinine1.42[name] [id number], [id number] 01/31/2019 Creatinine1.80, BUN38, Potassium3.9, [name], [name]  A complete set of results can be found in Results Review.  Recommendations: Advised to take 1 prn Torsemide x 1 day.   Follow-up plan: ICM clinic phone appointment on2/14/2022. 91 day device clinic remote transmission1/18/2022.   EP/Cardiology Next Visit:05/23/2020 with Dr Aundra Dubin. Last visit with Dr Lovena Le was 06/01/2018. Recall for11/29/2020with Dr Lovena Le  Copy of ICM check sent toDr. Rayann Heman.   3 month ICM trend: 04/09/2020.    1 Year ICM trend:       [name], RN 04/09/2020 3:38 PM

## 2020-04-10 ENCOUNTER — Other Ambulatory Visit (HOSPITAL_COMMUNITY): Payer: Self-pay

## 2020-04-10 NOTE — Telephone Encounter (Signed)
error 

## 2020-04-17 ENCOUNTER — Ambulatory Visit (INDEPENDENT_AMBULATORY_CARE_PROVIDER_SITE_OTHER): Payer: Medicare Other

## 2020-04-17 DIAGNOSIS — I429 Cardiomyopathy, unspecified: Secondary | ICD-10-CM

## 2020-04-17 LAB — CUP PACEART REMOTE DEVICE CHECK
Battery Remaining Longevity: 120 mo
Battery Voltage: 3.01 V
Brady Statistic RV Percent Paced: 0.5 %
Date Time Interrogation Session: 20220118043822
HighPow Impedance: 47 Ohm
Implantable Lead Implant Date: 20191203
Implantable Lead Location: 753860
Implantable Pulse Generator Implant Date: 20191203
Lead Channel Impedance Value: 304 Ohm
Lead Channel Impedance Value: 361 Ohm
Lead Channel Pacing Threshold Amplitude: 0.625 V
Lead Channel Pacing Threshold Pulse Width: 0.4 ms
Lead Channel Sensing Intrinsic Amplitude: 6.5 mV
Lead Channel Sensing Intrinsic Amplitude: 6.5 mV
Lead Channel Setting Pacing Amplitude: 2.5 V
Lead Channel Setting Pacing Pulse Width: 0.4 ms
Lead Channel Setting Sensing Sensitivity: 0.3 mV

## 2020-05-01 NOTE — Progress Notes (Signed)
Remote ICD transmission.   

## 2020-05-14 ENCOUNTER — Ambulatory Visit (INDEPENDENT_AMBULATORY_CARE_PROVIDER_SITE_OTHER): Payer: Medicare Other

## 2020-05-14 DIAGNOSIS — I5022 Chronic systolic (congestive) heart failure: Secondary | ICD-10-CM | POA: Diagnosis not present

## 2020-05-14 DIAGNOSIS — Z95810 Presence of automatic (implantable) cardiac defibrillator: Secondary | ICD-10-CM | POA: Diagnosis not present

## 2020-05-22 NOTE — Progress Notes (Signed)
EPIC Encounter for ICM Monitoring  Patient Name: CHARON SMEDBERG is a 55 y.o. female Date: 05/22/2020 Primary Care Physican: Patient, No Pcp Per Primary Cardiologist:Hochrein/McLean Electrophysiologist: Allred Nephrologist: Dr Hollie Salk at [facility] 04/09/2020 Weight:178lbs      Transmission reviewed.  OptiVolThoracic impedance suggesting normal fluid levels.  Prescribed:Torsemide20 mg take 1 tablet by mouth daily. 04/09/2020 Patient reports she takes Torsemide as needed.  Labs: 02/14/2019 Creatinine1.42[name] [id number], [id number] 01/31/2019 Creatinine1.80, BUN38, Potassium3.9, [name], [name]  A complete set of results can be found in Results Review.  Recommendations: No changes   Follow-up plan: ICM clinic phone appointment on3/21/2022. 91 day device clinic remote transmission4/19/2022.   Cardiology Next Visit:05/23/2020 with Dr Aundra Dubin. EP Next Visit:  Message sent to scheduler to contact patient for EP appointment.  Last visit with Dr Lovena Le was 06/01/2018. Recall for11/29/2020with Dr Lovena Le  Copy of ICM check sent toDr. Rayann Heman.  3 month ICM trend: 05/14/2020.    1 Year ICM trend:       [name], RN 05/22/2020 9:04 AM

## 2020-05-23 ENCOUNTER — Ambulatory Visit (HOSPITAL_COMMUNITY)
Admission: RE | Admit: 2020-05-23 | Discharge: 2020-05-23 | Disposition: A | Discharge status: Home / Self Care | Payer: Medicare Other | Source: Ambulatory Visit | Attending: Cardiology | Admitting: Cardiology

## 2020-05-23 ENCOUNTER — Other Ambulatory Visit: Payer: Self-pay

## 2020-05-23 ENCOUNTER — Encounter (HOSPITAL_COMMUNITY): Payer: Self-pay | Admitting: Cardiology

## 2020-05-23 VITALS — BP 130/80 | HR 66 | Wt 184.4 lb

## 2020-05-23 DIAGNOSIS — I480 Paroxysmal atrial fibrillation: Secondary | ICD-10-CM | POA: Diagnosis not present

## 2020-05-23 DIAGNOSIS — G4733 Obstructive sleep apnea (adult) (pediatric): Secondary | ICD-10-CM | POA: Diagnosis not present

## 2020-05-23 DIAGNOSIS — Z9884 Bariatric surgery status: Secondary | ICD-10-CM | POA: Diagnosis not present

## 2020-05-23 DIAGNOSIS — N183 Chronic kidney disease, stage 3 unspecified: Secondary | ICD-10-CM | POA: Diagnosis not present

## 2020-05-23 DIAGNOSIS — I428 Other cardiomyopathies: Secondary | ICD-10-CM | POA: Diagnosis not present

## 2020-05-23 DIAGNOSIS — K921 Melena: Secondary | ICD-10-CM | POA: Insufficient documentation

## 2020-05-23 DIAGNOSIS — Z7901 Long term (current) use of anticoagulants: Secondary | ICD-10-CM | POA: Insufficient documentation

## 2020-05-23 DIAGNOSIS — R5383 Other fatigue: Secondary | ICD-10-CM | POA: Diagnosis not present

## 2020-05-23 DIAGNOSIS — Z79890 Hormone replacement therapy (postmenopausal): Secondary | ICD-10-CM | POA: Diagnosis not present

## 2020-05-23 DIAGNOSIS — I4819 Other persistent atrial fibrillation: Secondary | ICD-10-CM | POA: Diagnosis not present

## 2020-05-23 DIAGNOSIS — I130 Hypertensive heart and chronic kidney disease with heart failure and stage 1 through stage 4 chronic kidney disease, or unspecified chronic kidney disease: Secondary | ICD-10-CM | POA: Insufficient documentation

## 2020-05-23 DIAGNOSIS — E1122 Type 2 diabetes mellitus with diabetic chronic kidney disease: Secondary | ICD-10-CM | POA: Insufficient documentation

## 2020-05-23 DIAGNOSIS — Z79899 Other long term (current) drug therapy: Secondary | ICD-10-CM | POA: Insufficient documentation

## 2020-05-23 DIAGNOSIS — E039 Hypothyroidism, unspecified: Secondary | ICD-10-CM | POA: Insufficient documentation

## 2020-05-23 DIAGNOSIS — I4892 Unspecified atrial flutter: Secondary | ICD-10-CM | POA: Insufficient documentation

## 2020-05-23 DIAGNOSIS — I5022 Chronic systolic (congestive) heart failure: Secondary | ICD-10-CM | POA: Diagnosis not present

## 2020-05-23 LAB — CBC
HCT: 40.3 % (ref 36.0–46.0)
Hemoglobin: 13.2 g/dL (ref 12.0–15.0)
MCH: 28.3 pg (ref 26.0–34.0)
MCHC: 32.8 g/dL (ref 30.0–36.0)
MCV: 86.3 fL (ref 80.0–100.0)
Platelets: 182 10*3/uL (ref 150–400)
RBC: 4.67 MIL/uL (ref 3.87–5.11)
RDW: 13.2 % (ref 11.5–15.5)
WBC: 5.7 10*3/uL (ref 4.0–10.5)
nRBC: 0 % (ref 0.0–0.2)

## 2020-05-23 LAB — BASIC METABOLIC PANEL
Anion gap: 12 (ref 5–15)
BUN: 19 mg/dL (ref 6–20)
CO2: 18 mmol/L — ABNORMAL LOW (ref 22–32)
Calcium: 9.2 mg/dL (ref 8.9–10.3)
Chloride: 108 mmol/L (ref 98–111)
Creatinine, Ser: 1.62 mg/dL — ABNORMAL HIGH (ref 0.44–1.00)
GFR, Estimated: 37 mL/min — ABNORMAL LOW (ref >60)
Glucose, Bld: 82 mg/dL (ref 70–99)
Potassium: 4.4 mmol/L (ref 3.5–5.1)
Sodium: 138 mmol/L (ref 135–145)

## 2020-05-23 LAB — TSH: TSH: 4.901 u[IU]/mL — ABNORMAL HIGH (ref 0.350–4.500)

## 2020-05-23 NOTE — Patient Instructions (Addendum)
Labs done today. We will contact you only if your labs are abnormal.  START Farxiga 10mg  (1 tablet) by mouth daily.  No other medication changes were made. Please continue all current medications as prescribed.  Your physician recommends that you schedule a follow-up appointment in: 10 days for a lab only appointment and in 3 months for an appointment with Dr. Aundra Dubin  Please contact your G.I. Doctor regarding the blood in your stool.  If you have any questions or concerns before your next appointment please send [place] a message through [facility] or call our office at [phone].    TO LEAVE A MESSAGE FOR THE NURSE SELECT OPTION 2, PLEASE LEAVE A MESSAGE INCLUDING: . YOUR NAME . DATE OF BIRTH . CALL BACK NUMBER . REASON FOR CALL**this is important as we prioritize the call backs  YOU WILL RECEIVE A CALL BACK THE SAME DAY AS LONG AS YOU CALL BEFORE 4:00 PM   Do the following things EVERYDAY: 1) Weigh yourself in the morning before breakfast. Write it down and keep it in a log. 2) Take your medicines as prescribed 3) Eat low salt foods--Limit salt (sodium) to 2000 mg per day.  4) Stay as active as you can everyday 5) Limit all fluids for the day to less than 2 liters   At the [facility], you and your health needs are our priority. As part of our continuing mission to provide you with exceptional heart care, we have created designated Provider Care Teams. These Care Teams include your primary Cardiologist (physician) and Advanced Practice Providers (APPs- Physician Assistants and Nurse Practitioners) who all work together to provide you with the care you need, when you need it.   You may see any of the following providers on your designated Care Team at your next follow up: Marland Kitchen Dr Glori Bickers . Dr Loralie Champagne . Darrick Grinder, NP . Lyda Jester, PA . Audry Riles, PharmD   Please be sure to bring in all your medications bottles to every appointment.

## 2020-05-23 NOTE — Progress Notes (Signed)
PCP: Dr. Sherrie Sport HF Cardiology: Dr. Aundra Dubin  Erin Jarvis is a 55 y.o. 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 At one point EF improved to 45% from 20%.  She also has a history VT with Medtronic ICD placed in [place]. In 12/17, she was  admitted to [facility] with new onset atrial fibrillation/RVR and volume overload. Required IV diuresis and cardizem drip.  She remained in atrial fibrillation.  Warfarin was started that admission.   On 03/26/16, she saw Dr Percival Spanish and she was set up TEE/DCCV. Creatinine at that time was 2.03.  She had had a functional decline and dyspnea at rest. Weight at home had been trending up from 290 to 309 pounds.  SBP in 90s at Dr. Rosezella [place] office.   On 04/08/16, she presented for scheduled TEE/DC-CV, however thrombus was noted in LA appendage so DC-CV was not pursued. TEE also showed severe LV dysfunction. Dyspneic at rest. SBP soft.  She was admitted and ultimately required initiation of milrinone gtt + diuresis with IV Lasix and metolazone.  She lost > 20 lbs.  We were able to titrate her off milrinone and she was sent home on torsemide.  Rate was difficult to control, Toprol XL was titrated up to 75 mg bid.   At appointment in 2/18, she was noted to be back in NSR.  She remains in NSR today.   She had gastric bypass in 3/19 at [facility].   She saw Dr. Rayann Heman for evaluation for possible afib ablation.  He recommended initial weight loss + amiodarone, then try to stop amiodarone after she has lost a fair amount of weight. She lost weight and is now off amiodarone.  Echo in 8/19 showed EF 40-45%.  Echo in 8/20 showed EF 40-45%, normal RV.   She returns for followup of CHF.  She was taken off spironolactone about a year ago by Dr. Hollie Salk.  She has not needed torsemide.  Rare palpitations.  No significant exertional dyspnea.  No chest pain.  No lightheadedness.  Weight is down about 9 lbs from prior appointment. Less knee  pain after she had arthroscopic surgery.  Of note, she has had some blood in her stool recently.   Labs (1/18): digoxin 0.5, K 4.5, creatinine 2.13, digoxin 1.7, AST 53, ALT 34, TSH elevated Labs (2/18): K 3.9, creatinine 1.98 => 1.6, TSH 14, normal free T3 and free T4, LFTs normal, digoxin 1.7 Labs (3/18): K 3.8, creatinine 1.8, digoxin 1.0 Labs (12/18): K 4, creatinine 1.62 Labs (3/19): creatinine 2.06 Labs (7/19): K 4, creatinine 1.66 Labs (8/19): hgb 12.7 Labs (9/19): K 4.4, creatinine 1.44 Labs (12/19): K 3.8, creatinine 1.61 Labs (2/20): K 3.6, creatinine 1.7 Labs (7/20): K 4, creatinine 2.39, TSH low, free T4 high, T3 normal Labs (11/20): K 3.6, creatinine 1.42  Medtronic device interrogated: Stable thoracic impedance, no AF or VT.   ECG (personally reviewed): NSR, low voltage  PMH: 1. Chronic systolic CHF: Nonischemic cardiomyopathy.  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  EF initially 20%, rose to 45% at one point.   - h/o VT => Medtronic ICD.  - Echo (1/18): EF 25-30%, mildly dilated RV with normal systolic function, moderate TR, PASP 31 mmHg.  - LHC/RHC (1/18): No CAD; mean RA 19, PA 46/20 mean 35, mean PCWP 24, CI 2.37 (Thermo), CI 3.16 (Fick), PVR 1.9 WU.  - Echo (8/19): EF 40-45%, severe LV dilation, no significant MR - Echo (8/20): EF 40-45%,  normal RV 2. Atrial fibrillation: Persistent.  Diagnosed 12/17 when she presented to [facility] with afib/RVR.  TEE/DCCV set up in 1/18 but no DCCV due to LA thrombus on TEE.   3. Gout 4. Type II diabetes 5. HTN 6. OSA: Uses CPAP 7. CKD stage III: Has history of atrophic left kidney  8. Hypothyroidism 9. VT: 1/18, had ICD discharge.  10. Gastric bypass 3/19.  11. OA 12. Gout  SH: Divorced, on disability, lives with parents in [place], nonsmoker, no ETOH, no drugs.   Family History  Problem Relation Age of Onset  . Other Mother        has chronic pain syndrome from DDD with spinal cord  stimulator  . Hypertension Mother   . COPD Father   . Arthritis/Rheumatoid Father    ROS: All systems reviewed and negative except as per HPI.   Current Outpatient Medications  Medication Sig Dispense Refill  . acetaminophen (TYLENOL) 500 MG tablet Take 1,000 mg by mouth every 6 (six) hours as needed for mild pain or moderate pain.    Marland Kitchen apixaban (ELIQUIS) 5 MG TABS tablet Take 1 tablet (5 mg total) by mouth 2 (two) times daily. 60 tablet 3  . Calcium Citrate-Vitamin D 250-200 MG-UNIT TABS Take 1 tablet by mouth daily.    . citalopram (CELEXA) 10 MG tablet Take 10 mg by mouth daily.     . diphenhydramine-acetaminophen (TYLENOL PM) 25-500 MG TABS tablet Take 1 tablet by mouth at bedtime as needed (sleep).    . ENTRESTO 24-26 MG TAKE 1 TABLET BY MOUTH TWICE DAILY 60 tablet 1  . ferrous sulfate 325 (65 FE) MG tablet Take 325 mg by mouth daily with breakfast.    . levothyroxine (SYNTHROID) 25 MCG tablet TAKE 1 TABLET(25 MCG) BY MOUTH DAILY BEFORE BREAKFAST 30 tablet 1  . metoprolol succinate (TOPROL-XL) 100 MG 24 hr tablet TAKE 1 TABLET(100 MG) BY MOUTH TWICE DAILY WITH OR IMMEDIATELY FOLLOWING A MEAL 60 tablet 2  . Multiple Vitamin (MULTIVITAMIN) tablet Take 1 tablet by mouth daily.    . pantoprazole (PROTONIX) 40 MG tablet Take 1 tablet (40 mg total) by mouth daily. 30 tablet 12  . torsemide (DEMADEX) 20 MG tablet Take 20 mg by mouth as needed.    . vitamin B-12 (CYANOCOBALAMIN) 1000 MCG tablet Take 1,000 mcg by mouth daily.     No current facility-administered medications for this encounter.   BP 130/80   Pulse 66   Wt 83.6 kg (184 lb 6.4 oz)   LMP 03/14/2014 Comment: neg preg test  SpO2 99%   BMI 28.88 kg/m  General: NAD Neck: No JVD, no thyromegaly or thyroid nodule.  Lungs: Clear to auscultation bilaterally with normal respiratory effort. CV: Nondisplaced PMI.  Heart regular S1/S2, no S3/S4, no murmur.  No peripheral edema.  No carotid bruit.  Normal pedal pulses.  Abdomen: Soft,  nontender, no hepatosplenomegaly, no distention.  Skin: Intact without lesions or rashes.  Neurologic: Alert and oriented x 3.  Psych: Normal affect. Extremities: No clubbing or cyanosis.  HEENT: Normal.   Assessment/Plan: 1. Chronic systolic CHF: Echo 1/91 with EF 25-30%, nonischemic cardiomyopathy. Cardiomyopathy known since 56. Possibly peri-partum. SPEP negative.  Has Medtronic ICD. LHC (1/18) with no coronary disease.  She was admitted in 1/18 with extensive diuresis, required milrinone due to low output but was able to titrate off.  When RHC was done, cardiac output was ok off milrinone. Most recent echo 8/20 with EF up to 40-45%.  NYHA class II symptoms now, she is not volume overloaded on exam.   - She can continue to use torsemide prn (has not needed).   - Continue Toprol XL 100 mg bid.    - Continue Entresto 24/26 bid.  - I will arrange for repeat echo.  - She was taken off spironolactone about a year ago, may restart in future.  - Add Farxiga 10 mg daily with CHF and CKD. BMET today and in 10 days.  2. Atrial flutter/fibrillation:  Noted in 12/17.  TEE in 1/18 showed LA appendage thrombus. She is now on Eliquis. Suspect atrial fibrillation with RVR drove her last CHF decompensation.  She remains in NSR today, now off amiodarone. Weight loss likely will help her maintain NSR.  - If atrial fibrillation recurs off amiodarone, would consider atrial fibrillation ablation.  - Continue Toprol XL 100 bid.   - Continue Eliquis  3. CKD: Stage III.  She has an atrophic left kidney.   - BMET today.  - Start Farxiga 10 mg daily.   4. H/o VT: Has Medtronic ICD. 5. OSA: Uses CPAP.   6. Hematochezia: She has noted blood in her stool at times for a couple of weeks.  She has an established GI MD and will call for appointment.   Followup in 3 months.  Loralie Champagne 05/23/2020

## 2020-05-24 MED ORDER — DAPAGLIFLOZIN PROPANEDIOL 10 MG PO TABS: 10.0000 mg | ORAL_TABLET | Freq: Every day | ORAL | 3 refills | Status: DC

## 2020-05-24 NOTE — Addendum Note (Signed)
Encounter addended by: Shonna Chock, CMA on: 3/74/8270 2:55 PM  Actions taken: Order list changed

## 2020-05-29 ENCOUNTER — Other Ambulatory Visit (HOSPITAL_COMMUNITY): Payer: Self-pay | Admitting: Cardiology

## 2020-06-04 ENCOUNTER — Ambulatory Visit (HOSPITAL_COMMUNITY)
Admission: RE | Admit: 2020-06-04 | Discharge: 2020-06-04 | Disposition: A | Discharge status: Home / Self Care | Payer: Medicare Other | Source: Ambulatory Visit | Attending: Cardiology | Admitting: Cardiology

## 2020-06-04 ENCOUNTER — Other Ambulatory Visit: Payer: Self-pay

## 2020-06-04 DIAGNOSIS — I5022 Chronic systolic (congestive) heart failure: Secondary | ICD-10-CM | POA: Diagnosis not present

## 2020-06-04 LAB — BASIC METABOLIC PANEL
Anion gap: 10 (ref 5–15)
BUN: 19 mg/dL (ref 6–20)
CO2: 20 mmol/L — ABNORMAL LOW (ref 22–32)
Calcium: 9.4 mg/dL (ref 8.9–10.3)
Chloride: 108 mmol/L (ref 98–111)
Creatinine, Ser: 1.63 mg/dL — ABNORMAL HIGH (ref 0.44–1.00)
GFR, Estimated: 37 mL/min — ABNORMAL LOW (ref >60)
Glucose, Bld: 124 mg/dL — ABNORMAL HIGH (ref 70–99)
Potassium: 4.2 mmol/L (ref 3.5–5.1)
Sodium: 138 mmol/L (ref 135–145)

## 2020-06-13 ENCOUNTER — Other Ambulatory Visit (HOSPITAL_COMMUNITY): Payer: Self-pay

## 2020-06-13 DIAGNOSIS — I5022 Chronic systolic (congestive) heart failure: Secondary | ICD-10-CM

## 2020-06-13 NOTE — Progress Notes (Signed)
Orders Placed This Encounter  Procedures  . ECHOCARDIOGRAM COMPLETE    Standing Status:   Future    Standing Expiration Date:   06/13/2021    Order Specific Question:   Where should this test be performed    Answer:   [facility]    Order Specific Question:   Perflutren DEFINITY (image enhancing agent) should be administered unless hypersensitivity or allergy exist    Answer:   Administer Perflutren    Order Specific Question:   Reason for exam-Echo    Answer:   Congestive Heart Failure  I50.9    Order Specific Question:   Release to patient    Answer:   Immediate

## 2020-06-18 ENCOUNTER — Ambulatory Visit (HOSPITAL_COMMUNITY)
Admission: RE | Admit: 2020-06-18 | Discharge: 2020-06-18 | Disposition: A | Discharge status: Home / Self Care | Payer: Medicare Other | Source: Ambulatory Visit | Attending: Cardiology | Admitting: Cardiology

## 2020-06-18 ENCOUNTER — Other Ambulatory Visit: Payer: Self-pay

## 2020-06-18 ENCOUNTER — Ambulatory Visit (INDEPENDENT_AMBULATORY_CARE_PROVIDER_SITE_OTHER): Payer: Medicare Other

## 2020-06-18 DIAGNOSIS — I4891 Unspecified atrial fibrillation: Secondary | ICD-10-CM | POA: Diagnosis not present

## 2020-06-18 DIAGNOSIS — I420 Dilated cardiomyopathy: Secondary | ICD-10-CM | POA: Diagnosis not present

## 2020-06-18 DIAGNOSIS — I429 Cardiomyopathy, unspecified: Secondary | ICD-10-CM | POA: Insufficient documentation

## 2020-06-18 DIAGNOSIS — I5022 Chronic systolic (congestive) heart failure: Secondary | ICD-10-CM

## 2020-06-18 DIAGNOSIS — R9431 Abnormal electrocardiogram [ECG] [EKG]: Secondary | ICD-10-CM | POA: Diagnosis not present

## 2020-06-18 DIAGNOSIS — Z95810 Presence of automatic (implantable) cardiac defibrillator: Secondary | ICD-10-CM | POA: Diagnosis not present

## 2020-06-18 DIAGNOSIS — Z950 Presence of cardiac pacemaker: Secondary | ICD-10-CM | POA: Diagnosis not present

## 2020-06-18 DIAGNOSIS — I110 Hypertensive heart disease with heart failure: Secondary | ICD-10-CM | POA: Insufficient documentation

## 2020-06-18 LAB — ECHOCARDIOGRAM COMPLETE
Area-P 1/2: 3.03 cm2
Calc EF: 54.2 %
S' Lateral: 3.5 cm
Single Plane A2C EF: 56.4 %
Single Plane A4C EF: 53 %

## 2020-06-18 NOTE — Progress Notes (Signed)
  Echocardiogram 2D Echocardiogram has been performed.  Erin Jarvis 06/18/2020, 8:56 AM

## 2020-06-20 NOTE — Progress Notes (Signed)
EPIC Encounter for ICM Monitoring  Patient Name: RIGBY SWAMY is a 55 y.o. female Date: 06/20/2020 Primary Care Physican: Patient, No Pcp Per Primary Cardiologist:Hochrein/McLean Electrophysiologist: Allred Nephrologist: Dr Hollie Salk at [facility] 1/10/2022Weight:178lbs      Attempted call to patient and unable to reach.  Left detailed message per DPR regarding transmission. Transmission reviewed.   OptiVolThoracic impedance suggesting normal fluid levels.  Prescribed:Torsemide20 mg take 1 tablet by mouth daily. 04/09/2020 Patient reports she takes Torsemide as needed.  Labs: 02/14/2019 Creatinine1.42[name] [id number], [id number] 01/31/2019 Creatinine1.80, BUN38, Potassium3.9, [name], [name]  A complete set of results can be found in Results Review.  Recommendations:Left voice mail with ICM number and encouraged to call if experiencing any fluid symptoms.  Follow-up plan: ICM clinic phone appointment on4/25/2022. 91 day device clinic remote transmission4/19/2022.   Cardiology Next Visit:5/24/2022with Dr Aundra Dubin. EP Next Visit:  08/02/2020 with Dr Lovena Le.   Copy of ICM check sent toDr. Allred.  3 month ICM trend: 06/18/2020.    1 Year ICM trend:       [name], RN 06/20/2020 3:57 PM

## 2020-06-25 ENCOUNTER — Telehealth (HOSPITAL_COMMUNITY): Payer: Self-pay

## 2020-06-25 NOTE — Telephone Encounter (Signed)
-----   Message from Larey Dresser, MD sent at 06/18/2020  5:03 PM EDT ----- EF 50%, mildly decreased

## 2020-06-25 NOTE — Telephone Encounter (Signed)
Lmtrc,letter mailed to address on file

## 2020-06-26 ENCOUNTER — Telehealth (HOSPITAL_COMMUNITY): Payer: Self-pay | Admitting: *Deleted

## 2020-06-26 NOTE — Telephone Encounter (Signed)
Pt called requesting echo results. Results reviewed and patient thanked me for the call.

## 2020-07-01 ENCOUNTER — Other Ambulatory Visit (HOSPITAL_COMMUNITY): Payer: Self-pay | Admitting: Cardiology

## 2020-07-17 ENCOUNTER — Ambulatory Visit (INDEPENDENT_AMBULATORY_CARE_PROVIDER_SITE_OTHER): Payer: Medicare Other

## 2020-07-17 DIAGNOSIS — I429 Cardiomyopathy, unspecified: Secondary | ICD-10-CM

## 2020-07-17 LAB — CUP PACEART REMOTE DEVICE CHECK
Battery Remaining Longevity: 119 mo
Battery Voltage: 3.01 V
Brady Statistic RV Percent Paced: 0.42 %
Date Time Interrogation Session: 20220419012404
HighPow Impedance: 62 Ohm
Implantable Lead Implant Date: 20191203
Implantable Lead Location: 753860
Implantable Pulse Generator Implant Date: 20191203
Lead Channel Impedance Value: 304 Ohm
Lead Channel Impedance Value: 342 Ohm
Lead Channel Pacing Threshold Amplitude: 0.625 V
Lead Channel Pacing Threshold Pulse Width: 0.4 ms
Lead Channel Sensing Intrinsic Amplitude: 7 mV
Lead Channel Sensing Intrinsic Amplitude: 7 mV
Lead Channel Setting Pacing Amplitude: 2.5 V
Lead Channel Setting Pacing Pulse Width: 0.4 ms
Lead Channel Setting Sensing Sensitivity: 0.3 mV

## 2020-07-23 ENCOUNTER — Ambulatory Visit (INDEPENDENT_AMBULATORY_CARE_PROVIDER_SITE_OTHER): Payer: Medicare Other

## 2020-07-23 DIAGNOSIS — I5022 Chronic systolic (congestive) heart failure: Secondary | ICD-10-CM | POA: Diagnosis not present

## 2020-07-23 DIAGNOSIS — Z95810 Presence of automatic (implantable) cardiac defibrillator: Secondary | ICD-10-CM

## 2020-07-25 ENCOUNTER — Telehealth: Payer: Self-pay

## 2020-07-25 NOTE — Progress Notes (Signed)
EPIC Encounter for ICM Monitoring  Patient Name: Erin Jarvis is a 55 y.o. female Date: 07/25/2020 Primary Care Physican: Patient, No Pcp Per (Inactive) Primary Cardiologist:Hochrein/McLean Electrophysiologist: Allred Nephrologist: Dr Hollie Salk at [facility] 1/10/2022Weight:178lbs      Attempted call to patient and unable to reach.  Left detailed message per DPR regarding transmission. Transmission reviewed.   OptiVolThoracic impedancesuggesting normal fluid levels.  Prescribed:Torsemide20 mg take as needed.  Labs: 06/04/2020 Creatinine 1.63, BUN 19, Potassium 4.2, Sodium 138, GFR 37 05/23/2020 Creatinine 1.62, BUN 19, Potassium 4.4, Sodium 138, GFR 37  A complete set of results can be found in Results Review.  Recommendations:Left voice mail with ICM number and encouraged to call if experiencing any fluid symptoms.  Follow-up plan: ICM clinic phone appointment on6/08/2020. 91 day device clinic remote transmission7/19/2022.   Cardiology Next Visit:5/24/2022with Dr Aundra Dubin. EP Next Visit: 08/02/2020 with Dr Lovena Le.   Copy of ICM check sent toDr. Allred.  3 month ICM trend: 07/25/2020.    1 Year ICM trend:       [name], RN 07/25/2020 10:27 AM

## 2020-07-25 NOTE — Telephone Encounter (Signed)
Remote ICM transmission received.  Attempted call to patient regarding ICM remote transmission and left detailed message per DPR.  Advised to return call for any fluid symptoms or questions. Next ICM remote transmission scheduled 09/03/2020.     

## 2020-08-02 ENCOUNTER — Other Ambulatory Visit: Payer: Self-pay

## 2020-08-02 ENCOUNTER — Ambulatory Visit (INDEPENDENT_AMBULATORY_CARE_PROVIDER_SITE_OTHER): Payer: Medicare Other | Admitting: Internal Medicine

## 2020-08-02 ENCOUNTER — Encounter: Payer: Self-pay | Admitting: Internal Medicine

## 2020-08-02 VITALS — BP 112/76 | HR 56 | Ht 67.0 in | Wt 186.4 lb

## 2020-08-02 DIAGNOSIS — I5022 Chronic systolic (congestive) heart failure: Secondary | ICD-10-CM | POA: Diagnosis not present

## 2020-08-02 DIAGNOSIS — I472 Ventricular tachycardia, unspecified: Secondary | ICD-10-CM

## 2020-08-02 DIAGNOSIS — Z95810 Presence of automatic (implantable) cardiac defibrillator: Secondary | ICD-10-CM

## 2020-08-02 DIAGNOSIS — I4819 Other persistent atrial fibrillation: Secondary | ICD-10-CM | POA: Diagnosis not present

## 2020-08-02 NOTE — Progress Notes (Signed)
HPI [name] returns today for ongoing evaluation and management of her CHF, s/p ICD insertion. The patient notes that her EF has recently improved. She denies chest pain or sob. No syncope or ICD shock.  Allergies  Allergen Reactions  . Morphine And Related Nausea Only and Other (See Comments)    Feels sick,and like she is going to pass out; can't breathe, starts sweating  . Atorvastatin     Nightmares     Current Outpatient Medications  Medication Sig Dispense Refill  . acetaminophen (TYLENOL) 500 MG tablet Take 1,000 mg by mouth every 6 (six) hours as needed for mild pain or moderate pain.    Marland Kitchen apixaban (ELIQUIS) 5 MG TABS tablet Take 1 tablet (5 mg total) by mouth 2 (two) times daily. 60 tablet 3  . Calcium Citrate-Vitamin D 250-200 MG-UNIT TABS Take 1 tablet by mouth daily.    . citalopram (CELEXA) 10 MG tablet Take 10 mg by mouth daily.     . dapagliflozin propanediol (FARXIGA) 10 MG TABS tablet Take 1 tablet (10 mg total) by mouth daily before breakfast. 90 tablet 3  . diphenhydramine-acetaminophen (TYLENOL PM) 25-500 MG TABS tablet Take 1 tablet by mouth at bedtime as needed (sleep).    . ENTRESTO 24-26 MG TAKE 1 TABLET BY MOUTH TWICE DAILY 60 tablet 1  . ferrous sulfate 325 (65 FE) MG tablet Take 325 mg by mouth daily with breakfast.    . levothyroxine (SYNTHROID) 25 MCG tablet TAKE 1 TABLET(25 MCG) BY MOUTH DAILY BEFORE BREAKFAST 30 tablet 1  . metoprolol succinate (TOPROL-XL) 100 MG 24 hr tablet TAKE 1 TABLET(100 MG) BY MOUTH TWICE DAILY WITH OR IMMEDIATELY FOLLOWING A MEAL 60 tablet 2  . Multiple Vitamin (MULTIVITAMIN) tablet Take 1 tablet by mouth daily.    . pantoprazole (PROTONIX) 40 MG tablet Take 1 tablet (40 mg total) by mouth daily. 30 tablet 12  . torsemide (DEMADEX) 20 MG tablet Take 20 mg by mouth as needed.    . vitamin B-12 (CYANOCOBALAMIN) 1000 MCG tablet Take 1,000 mcg by mouth daily.     No current facility-administered medications for this visit.      Past Medical History:  Diagnosis Date  . AICD (automatic cardioverter/defibrillator) present 2002, 2010   secondary to post-partum cardiomyopathy (1996)  . Arm vein blood clot, right   . Arthritis   . CHF (congestive heart failure) ([place])   . Chronic kidney disease    only 1 kidney - left kidney is the size of a thumb  . Depression   . Diabetes mellitus without complication ([place])    no longer on meds since weight loss  . Hypertension   . Hypertensive cardiovascular disease   . Hypothyroidism   . Obesity   . Persistent atrial fibrillation ([place])   . Pneumonia   . Postpartum cardiomyopathy    Last echocardiogram was last year and EF 45 -50 %  . Seasonal allergies   . Sleep apnea    complaint with CPAP  . Ventricular tachycardia (H[facility])    s/p ICD Implantation (MDT Secura VR)    ROS:   All systems reviewed and negative except as noted in the HPI.   Past Surgical History:  Procedure Laterality Date  . CARDIAC CATHETERIZATION N/A 04/11/2016   Procedure: Right/Left Heart Cath and Coronary Angiography;  Surgeon: Larey Dresser, MD;  Location: [place] [facility];  Service: Cardiovascular;  Laterality: N/A;  . CARDIAC DEFIBRILLATOR PLACEMENT  08/12/2000, 11/11/2008  Most recent Generator MDT Secura VR placed in [place]  . CESAREAN SECTION  1996  . CHOLECYSTECTOMY  2004  . GASTRIC BYPASS  05/2017  . ICD GENERATOR CHANGEOUT  03/02/2018  . ICD GENERATOR CHANGEOUT N/A 03/02/2018   Procedure: ICD GENERATOR CHANGEOUT;  Surgeon: Evans Lance, MD;  Location: [place] [facility];  Service: Cardiovascular;  Laterality: N/A;  . IR GENERIC HISTORICAL  06/13/2014   IR RADIOLOGIST EVAL & MGMT 06/13/2014 Greggory Keen, MD [facility] INTER[facility] RAD  . IR GENERIC HISTORICAL  04/09/2016   IR FLUORO GUIDE CV LINE LEFT 04/09/2016 Greggory Keen, MD [facility]  . IR GENERIC HISTORICAL  04/09/2016   IR US GUIDE VASC ACCESS RIGHT 04/09/2016 Greggory Keen, MD [facility]  . IR GENERIC HISTORICAL   04/09/2016   IR VENO/EXT/UNI RIGHT 04/09/2016 Greggory Keen, MD [facility]  . IR GENERIC HISTORICAL  04/09/2016   IR US GUIDE VASC ACCESS LEFT 04/09/2016 Greggory Keen, MD [facility]  . KNEE ARTHROSCOPY Right 11/30/2018   Procedure: right knee arthroscopy with debridement;  Surgeon: Meredith Pel, MD;  Location: [place];  Service: Orthopedics;  Laterality: Right;  . LEAD REVISION/REPAIR N/A 03/02/2018   Procedure: LEAD REVISION/REPAIR;  Surgeon: Evans Lance, MD;  Location: [place] [facility];  Service: Cardiovascular;  Laterality: N/A;  . TEE WITHOUT CARDIOVERSION N/A 04/08/2016   Procedure: TRANSESOPHAGEAL ECHOCARDIOGRAM (TEE);  Surgeon: Pixie Casino, MD;  Location: [facility];  Service: Cardiovascular;  Laterality: N/A;     Family History  Problem Relation Age of Onset  . Other Mother        has chronic pain syndrome from DDD with spinal cord stimulator  . Hypertension Mother   . COPD Father   . Arthritis/Rheumatoid Father      Social History   Socioeconomic History  . Marital status: Divorced    Spouse name: Not on file  . Number of children: Not on file  . Years of education: Not on file  . Highest education level: Not on file  Occupational History  . Not on file  Tobacco Use  . Smoking status: Never Smoker  . Smokeless tobacco: Never Used  Vaping Use  . Vaping Use: Never used  Substance and Sexual Activity  . Alcohol use: No    Alcohol/week: 0.0 standard drinks  . Drug use: No  . Sexual activity: Not on file  Other Topics Concern  . Not on file  Social History Narrative   Lives with her parents and 58 year old daughter in [place].   Social Determinants of Health   Financial Resource Strain: Not on file  Food Insecurity: Not on file  Transportation Needs: Not on file  Physical Activity: Not on file  Stress: Not on file  Social Connections: Not on file  Intimate Partner Violence: Not on file     BP 112/76   Pulse (!) 56   Ht 5\' 7"  (1.702  m)   Wt 186 lb 6.4 oz (84.6 kg)   LMP 03/14/2014 Comment: neg preg test  SpO2 96%   BMI 29.19 kg/m   Physical Exam:  Well appearing NAD HEENT: Unremarkable Neck:  No JVD, no thyromegally Lymphatics:  No adenopathy Back:  No CVA tenderness Lungs:  Clear with no wheezes HEART:  Regular rate rhythm, no murmurs, no rubs, no clicks Abd:  soft, positive bowel sounds, no organomegally, no rebound, no guarding Ext:  2 plus pulses, no edema, no cyanosis, no clubbing Skin:  No rashes no nodules Neuro:  CN II through XII intact, motor grossly intact  EKG - sinus brady with ASMI  DEVICE  Normal device function.  See PaceArt for details.   Assess/Plan: 1. Chronic systolic heart failure - her symptoms are class 2. She will continue her current medical therapy. Her EF is improved. 2. ICD - her medtronic ICD is working normally and she has almost 10 years of battery longevity. 3. Obesity - she is encouraged to lose weight.  4. Atrial fib - she is maintaining NSR very nicely. She will continue eliquis.

## 2020-08-02 NOTE — Patient Instructions (Signed)
Medication Instructions:  Your physician recommends that you continue on your current medications as directed. Please refer to the Current Medication list given to you today.  Labwork: None ordered.  Testing/Procedures: None ordered.  Follow-Up: Your physician wants you to follow-up in: one year with Cristopher Peru, MD or one of the following Advanced Practice Providers on your designated Care Team:    Chanetta Marshall, NP  Tommye Standard, PA-C  Legrand Como "Jonni Sanger" [place], [place]  Remote monitoring is used to monitor your ICD from home. This monitoring reduces the number of office visits required to check your device to one time per year. It allows [place] to keep an eye on the functioning of your device to ensure it is working properly. You are scheduled for a device check from home on 09/03/2020. You may send your transmission at any time that day. If you have a wireless device, the transmission will be sent automatically. After your physician reviews your transmission, you will receive a postcard with your next transmission date.  Any Other Special Instructions Will Be Listed Below (If Applicable).  If you need a refill on your cardiac medications before your next appointment, please call your pharmacy.

## 2020-08-03 NOTE — Progress Notes (Signed)
Remote ICD transmission.   

## 2020-08-21 ENCOUNTER — Ambulatory Visit (HOSPITAL_COMMUNITY)
Admission: RE | Admit: 2020-08-21 | Discharge: 2020-08-21 | Disposition: A | Discharge status: Home / Self Care | Payer: Medicare Other | Source: Ambulatory Visit | Attending: Cardiology | Admitting: Cardiology

## 2020-08-21 ENCOUNTER — Encounter (HOSPITAL_COMMUNITY): Payer: Self-pay | Admitting: Cardiology

## 2020-08-21 ENCOUNTER — Other Ambulatory Visit: Payer: Self-pay

## 2020-08-21 VITALS — BP 124/70 | HR 68 | Wt 189.6 lb

## 2020-08-21 DIAGNOSIS — Z8249 Family history of ischemic heart disease and other diseases of the circulatory system: Secondary | ICD-10-CM | POA: Insufficient documentation

## 2020-08-21 DIAGNOSIS — N183 Chronic kidney disease, stage 3 unspecified: Secondary | ICD-10-CM | POA: Diagnosis not present

## 2020-08-21 DIAGNOSIS — I130 Hypertensive heart and chronic kidney disease with heart failure and stage 1 through stage 4 chronic kidney disease, or unspecified chronic kidney disease: Secondary | ICD-10-CM | POA: Insufficient documentation

## 2020-08-21 DIAGNOSIS — E1122 Type 2 diabetes mellitus with diabetic chronic kidney disease: Secondary | ICD-10-CM | POA: Diagnosis not present

## 2020-08-21 DIAGNOSIS — I480 Paroxysmal atrial fibrillation: Secondary | ICD-10-CM | POA: Diagnosis not present

## 2020-08-21 DIAGNOSIS — Z79899 Other long term (current) drug therapy: Secondary | ICD-10-CM | POA: Insufficient documentation

## 2020-08-21 DIAGNOSIS — Z7984 Long term (current) use of oral hypoglycemic drugs: Secondary | ICD-10-CM | POA: Diagnosis not present

## 2020-08-21 DIAGNOSIS — I4892 Unspecified atrial flutter: Secondary | ICD-10-CM | POA: Diagnosis not present

## 2020-08-21 DIAGNOSIS — Z7901 Long term (current) use of anticoagulants: Secondary | ICD-10-CM | POA: Insufficient documentation

## 2020-08-21 DIAGNOSIS — G4733 Obstructive sleep apnea (adult) (pediatric): Secondary | ICD-10-CM | POA: Insufficient documentation

## 2020-08-21 DIAGNOSIS — I5022 Chronic systolic (congestive) heart failure: Secondary | ICD-10-CM

## 2020-08-21 DIAGNOSIS — I428 Other cardiomyopathies: Secondary | ICD-10-CM | POA: Insufficient documentation

## 2020-08-21 DIAGNOSIS — I4819 Other persistent atrial fibrillation: Secondary | ICD-10-CM | POA: Diagnosis not present

## 2020-08-21 LAB — BASIC METABOLIC PANEL
Anion gap: 7 (ref 5–15)
BUN: 20 mg/dL (ref 6–20)
CO2: 21 mmol/L — ABNORMAL LOW (ref 22–32)
Calcium: 9.2 mg/dL (ref 8.9–10.3)
Chloride: 112 mmol/L — ABNORMAL HIGH (ref 98–111)
Creatinine, Ser: 1.55 mg/dL — ABNORMAL HIGH (ref 0.44–1.00)
GFR, Estimated: 39 mL/min — ABNORMAL LOW (ref >60)
Glucose, Bld: 83 mg/dL (ref 70–99)
Potassium: 4 mmol/L (ref 3.5–5.1)
Sodium: 140 mmol/L (ref 135–145)

## 2020-08-21 LAB — CBC
HCT: 40.4 % (ref 36.0–46.0)
Hemoglobin: 13.2 g/dL (ref 12.0–15.0)
MCH: 28.9 pg (ref 26.0–34.0)
MCHC: 32.7 g/dL (ref 30.0–36.0)
MCV: 88.6 fL (ref 80.0–100.0)
Platelets: 191 10*3/uL (ref 150–400)
RBC: 4.56 MIL/uL (ref 3.87–5.11)
RDW: 12.9 % (ref 11.5–15.5)
WBC: 6 10*3/uL (ref 4.0–10.5)
nRBC: 0 % (ref 0.0–0.2)

## 2020-08-21 MED ORDER — DAPAGLIFLOZIN PROPANEDIOL 10 MG PO TABS: 10.0000 mg | ORAL_TABLET | Freq: Every day | ORAL | 3 refills | Status: DC

## 2020-08-21 MED ORDER — METOPROLOL SUCCINATE ER 100 MG PO TB24: ORAL_TABLET | ORAL | 3 refills | Status: DC

## 2020-08-21 MED ORDER — ENTRESTO 24-26 MG PO TABS: 1.0000 | ORAL_TABLET | Freq: Two times a day (BID) | ORAL | 3 refills | Status: DC

## 2020-08-21 MED ORDER — APIXABAN 5 MG PO TABS: 5.0000 mg | ORAL_TABLET | Freq: Two times a day (BID) | ORAL | 3 refills | Status: DC

## 2020-08-21 MED ORDER — TORSEMIDE 20 MG PO TABS: 20.0000 mg | ORAL_TABLET | ORAL | 5 refills | Status: DC | PRN

## 2020-08-21 NOTE — Progress Notes (Signed)
PCP: Dr. Sherrie Sport HF Cardiology: Dr. Aundra Dubin  Erin Jarvis is a 56 y.o. 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 At one point EF improved to 45% from 20%.  She also has a history VT with Medtronic ICD placed in [place]. In 12/17, she was  admitted to [facility] with new onset atrial fibrillation/RVR and volume overload. Required IV diuresis and cardizem drip.  She remained in atrial fibrillation.  Warfarin was started that admission.   On 03/26/16, she saw Dr Percival Spanish and she was set up TEE/DCCV. Creatinine at that time was 2.03.  She had had a functional decline and dyspnea at rest. Weight at home had been trending up from 290 to 309 pounds.  SBP in 90s at Dr. Rosezella [place] office.   On 04/08/16, she presented for scheduled TEE/DC-CV, however thrombus was noted in LA appendage so DC-CV was not pursued. TEE also showed severe LV dysfunction. Dyspneic at rest. SBP soft.  She was admitted and ultimately required initiation of milrinone gtt + diuresis with IV Lasix and metolazone.  She lost > 20 lbs.  We were able to titrate her off milrinone and she was sent home on torsemide.  Rate was difficult to control, Toprol XL was titrated up to 75 mg bid.   At appointment in 2/18, she was noted to be back in NSR.  She remains in NSR today.   She had gastric bypass in 3/19 at [facility].   She saw Dr. Rayann Heman for evaluation for possible afib ablation.  He recommended initial weight loss + amiodarone, then try to stop amiodarone after she has lost a fair amount of weight. She lost weight and is now off amiodarone.  Echo in 8/19 showed EF 40-45%.  Echo in 8/20 showed EF 40-45%, normal RV.  Echo was done today and reviewed, EF 50% with mild diffuse hypokinesis and normal RV.   She returns for followup of CHF.  Weight up 5 lbs. No further BRBPR (noted last appointment).  She did not go to GI, thinks it was from hemorrhoids.  No lightheadedness.  No significant exertional  dyspnea.  No chest pain.  No orthopnea/PND.    Labs (1/18): digoxin 0.5, K 4.5, creatinine 2.13, digoxin 1.7, AST 53, ALT 34, TSH elevated Labs (2/18): K 3.9, creatinine 1.98 => 1.6, TSH 14, normal free T3 and free T4, LFTs normal, digoxin 1.7 Labs (3/18): K 3.8, creatinine 1.8, digoxin 1.0 Labs (12/18): K 4, creatinine 1.62 Labs (3/19): creatinine 2.06 Labs (7/19): K 4, creatinine 1.66 Labs (8/19): hgb 12.7 Labs (9/19): K 4.4, creatinine 1.44 Labs (12/19): K 3.8, creatinine 1.61 Labs (2/20): K 3.6, creatinine 1.7 Labs (7/20): K 4, creatinine 2.39, TSH low, free T4 high, T3 normal Labs (11/20): K 3.6, creatinine 1.42 Labs (3/22): K 4.2, creatinine 1.63  Medtronic device interrogated: Stable thoracic impedance, no AF or VT.   ECG (personally reviewed): NSR 53, nonspecific T wave flattening  PMH: 1. Chronic systolic CHF: Nonischemic cardiomyopathy.  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  EF initially 20%, rose to 45% at one point.   - h/o VT => Medtronic ICD.  - Echo (1/18): EF 25-30%, mildly dilated RV with normal systolic function, moderate TR, PASP 31 mmHg.  - LHC/RHC (1/18): No CAD; mean RA 19, PA 46/20 mean 35, mean PCWP 24, CI 2.37 (Thermo), CI 3.16 (Fick), PVR 1.9 WU.  - Echo (8/19): EF 40-45%, severe LV dilation, no significant MR - Echo (8/20): EF 40-45%,  normal RV - Echo (5/22): EF 50% with mild diffuse hypokinesis and normal RV. 2. Atrial fibrillation: Persistent.  Diagnosed 12/17 when she presented to [facility] with afib/RVR.  TEE/DCCV set up in 1/18 but no DCCV due to LA thrombus on TEE.   3. Gout 4. Type II diabetes 5. HTN 6. OSA: Uses CPAP 7. CKD stage III: Has history of atrophic left kidney  8. Hypothyroidism 9. VT: 1/18, had ICD discharge.  10. Gastric bypass 3/19.  11. OA 12. Gout  SH: Divorced, on disability, lives with parents in [place], nonsmoker, no ETOH, no drugs.   Family History  Problem Relation Age of Onset  . Other  Mother        has chronic pain syndrome from DDD with spinal cord stimulator  . Hypertension Mother   . COPD Father   . Arthritis/Rheumatoid Father    ROS: All systems reviewed and negative except as per HPI.   Current Outpatient Medications  Medication Sig Dispense Refill  . acetaminophen (TYLENOL) 500 MG tablet Take 1,000 mg by mouth every 6 (six) hours as needed for mild pain or moderate pain.    . Calcium Citrate-Vitamin D 250-200 MG-UNIT TABS Take 1 tablet by mouth daily.    . citalopram (CELEXA) 10 MG tablet Take 10 mg by mouth daily.     . diphenhydramine-acetaminophen (TYLENOL PM) 25-500 MG TABS tablet Take 1 tablet by mouth at bedtime as needed (sleep).    . ferrous sulfate 325 (65 FE) MG tablet Take 325 mg by mouth daily with breakfast.    . levothyroxine (SYNTHROID) 25 MCG tablet TAKE 1 TABLET(25 MCG) BY MOUTH DAILY BEFORE BREAKFAST 30 tablet 1  . Multiple Vitamin (MULTIVITAMIN) tablet Take 1 tablet by mouth daily.    . vitamin B-12 (CYANOCOBALAMIN) 1000 MCG tablet Take 1,000 mcg by mouth daily.    Marland Kitchen apixaban (ELIQUIS) 5 MG TABS tablet Take 1 tablet (5 mg total) by mouth 2 (two) times daily. 180 tablet 3  . dapagliflozin propanediol (FARXIGA) 10 MG TABS tablet Take 1 tablet (10 mg total) by mouth daily before breakfast. 90 tablet 3  . metoprolol succinate (TOPROL-XL) 100 MG 24 hr tablet TAKE 1 TABLET(100 MG) BY MOUTH TWICE DAILY WITH OR IMMEDIATELY FOLLOWING A MEAL 180 tablet 3  . sacubitril-valsartan (ENTRESTO) 24-26 MG Take 1 tablet by mouth 2 (two) times daily. 180 tablet 3  . torsemide (DEMADEX) 20 MG tablet Take 1 tablet (20 mg total) by mouth as needed. 30 tablet 5   No current facility-administered medications for this encounter.   BP 124/70   Pulse 68   Wt 86 kg (189 lb 9.6 oz)   LMP 03/14/2014 Comment: neg preg test  SpO2 100%   BMI 29.70 kg/m  General: NAD Neck: No JVD, no thyromegaly or thyroid nodule.  Lungs: Clear to auscultation bilaterally with normal  respiratory effort. CV: Nondisplaced PMI.  Heart regular S1/S2, no S3/S4, no murmur.  No peripheral edema.  No carotid bruit.  Normal pedal pulses.  Abdomen: Soft, nontender, no hepatosplenomegaly, no distention.  Skin: Intact without lesions or rashes.  Neurologic: Alert and oriented x 3.  Psych: Normal affect. Extremities: No clubbing or cyanosis.  HEENT: Normal.   Assessment/Plan: 1. Chronic systolic CHF: Echo 5/39 with EF 25-30%, nonischemic cardiomyopathy. Cardiomyopathy known since 38. Possibly peri-partum. SPEP negative.  Has Medtronic ICD. LHC (1/18) with no coronary disease.  She was admitted in 1/18 with extensive diuresis, required milrinone due to low output but was able  to titrate off.  When RHC was done, cardiac output was ok off milrinone. Echo today shows EF up to 50%.  NYHA class I-II symptoms now, she is not volume overloaded on exam.   - She can continue to use torsemide prn (has not needed).   - Continue Toprol XL 100 mg bid.    - Continue Entresto 24/26 bid.  - Continue Farxiga 10 mg daily.  - BMET today.  2. Atrial flutter/fibrillation:  Noted in 12/17.  TEE in 1/18 showed LA appendage thrombus. She is now on Eliquis. Suspect atrial fibrillation with RVR drove her last CHF decompensation.  She remains in NSR today, now off amiodarone. Weight loss likely will help her maintain NSR.  - If atrial fibrillation recurs off amiodarone, would consider atrial fibrillation ablation.  - Continue Toprol XL 100 bid.   - Continue Eliquis, CBC today.  3. CKD: Stage III.  She has an atrophic left kidney.   - BMET today.  - Continue Farxiga 10 mg daily.   4. H/o VT: Has Medtronic ICD. 5. OSA: Uses CPAP.    Followup in 6 months.   Loralie Champagne 08/21/2020

## 2020-08-21 NOTE — Patient Instructions (Addendum)
EKG done today.  Labs done today. We will contact you only if your labs are abnormal.  No medication changes were made. Please continue all current medications as prescribed.  Your physician recommends that you schedule a follow-up appointment in: 6 months. Please contact our office in October to schedule a November appointment.    If you have any questions or concerns before your next appointment please send us a message through mychart or call our office at [phone].    TO LEAVE A MESSAGE FOR THE NURSE SELECT OPTION 2, PLEASE LEAVE A MESSAGE INCLUDING: . YOUR NAME . DATE OF BIRTH . CALL BACK NUMBER . REASON FOR CALL**this is important as we prioritize the call backs  YOU WILL RECEIVE A CALL BACK THE SAME DAY AS LONG AS YOU CALL BEFORE 4:00 PM   Do the following things EVERYDAY: 1) Weigh yourself in the morning before breakfast. Write it down and keep it in a log. 2) Take your medicines as prescribed 3) Eat low salt foods--Limit salt (sodium) to 2000 mg per day.  4) Stay as active as you can everyday 5) Limit all fluids for the day to less than 2 liters   At the [facility], you and your health needs are our priority. As part of our continuing mission to provide you with exceptional heart care, we have created designated Provider Care Teams. These Care Teams include your primary Cardiologist (physician) and Advanced Practice Providers (APPs- Physician Assistants and Nurse Practitioners) who all work together to provide you with the care you need, when you need it.   You may see any of the following providers on your designated Care Team at your next follow up: . Dr Daniel Bensimhon . Dr Dalton McLean . Amy Clegg, NP . Brittainy Simmons, PA . Lauren Kemp, PharmD   Please be sure to bring in all your medications bottles to every appointment.   

## 2020-08-28 ENCOUNTER — Other Ambulatory Visit (HOSPITAL_COMMUNITY): Payer: Self-pay | Admitting: Cardiology

## 2020-09-03 ENCOUNTER — Ambulatory Visit (INDEPENDENT_AMBULATORY_CARE_PROVIDER_SITE_OTHER): Payer: Medicare Other

## 2020-09-03 DIAGNOSIS — Z95810 Presence of automatic (implantable) cardiac defibrillator: Secondary | ICD-10-CM

## 2020-09-03 DIAGNOSIS — I5022 Chronic systolic (congestive) heart failure: Secondary | ICD-10-CM

## 2020-09-05 ENCOUNTER — Telehealth: Payer: Self-pay

## 2020-09-05 NOTE — Progress Notes (Addendum)
EPIC Encounter for ICM Monitoring  Patient Name: Erin Jarvis is a 56 y.o. female Date: 09/05/2020 Primary Care Physican: Patient, No Pcp Per (Inactive) Primary Cardiologist:Hochrein/McLean Electrophysiologist: Lovena Le Nephrologist: Dr Hollie Salk at [facility] 08/21/2020 OfficeWeight:189lbs      Attempted call to patient and unable to reach.  Left detailed message per DPR regarding transmission. Transmission reviewed.   OptiVolThoracic impedancesuggesting normal fluid levels.  Prescribed:Torsemide20 mg take 1 tablet as needed.  Labs: 08/21/2020 Creatinine 1.55, BUN 20, Potassium 4.0, Sodium 140, GFR 39 06/04/2020 Creatinine 1.63, BUN 19, Potassium 4.2, Sodium 138, GFR 37 05/23/2020 Creatinine 1.62, BUN 19, Potassium 4.4, Sodium 138, GFR 37  A complete set of results can be found in Results Review.  Recommendations:Left voice mail with ICM number and encouraged to call if experiencing any fluid symptoms.  Follow-up plan: ICM clinic phone appointment on6/08/2020. 91 day device clinic remote transmission7/19/2022.   EP/ Cardiology Next Visit:  Recall 11/20/2022with Dr Aundra Dubin. Recall5/4/2023with Dr Lovena Le.   Copy of ICM check sent toDr. Allred.  3 month ICM trend: 09/03/2020.    1 Year ICM trend:       [name], RN 09/05/2020 1:34 PM

## 2020-09-05 NOTE — Telephone Encounter (Signed)
Remote ICM transmission received.  Attempted call to patient regarding ICM remote transmission and left detailed message per DPR.  Advised to return call for any fluid symptoms or questions. Next ICM remote transmission scheduled 10/08/2020.

## 2020-09-28 ENCOUNTER — Other Ambulatory Visit (HOSPITAL_COMMUNITY): Payer: Self-pay | Admitting: Cardiology

## 2020-10-02 ENCOUNTER — Other Ambulatory Visit (HOSPITAL_COMMUNITY): Payer: Self-pay | Admitting: Cardiology

## 2020-10-02 ENCOUNTER — Other Ambulatory Visit (HOSPITAL_COMMUNITY): Payer: Self-pay | Admitting: *Deleted

## 2020-10-08 ENCOUNTER — Ambulatory Visit (INDEPENDENT_AMBULATORY_CARE_PROVIDER_SITE_OTHER): Payer: Medicare Other

## 2020-10-08 DIAGNOSIS — Z95810 Presence of automatic (implantable) cardiac defibrillator: Secondary | ICD-10-CM | POA: Diagnosis not present

## 2020-10-08 DIAGNOSIS — I5022 Chronic systolic (congestive) heart failure: Secondary | ICD-10-CM | POA: Diagnosis not present

## 2020-10-10 ENCOUNTER — Telehealth: Payer: Self-pay

## 2020-10-10 NOTE — Telephone Encounter (Signed)
Remote ICM transmission received.  Attempted call to patient regarding ICM remote transmission and left detailed message per DPR.  Advised to return call for any fluid symptoms or questions. Next ICM remote transmission scheduled 11/12/2020.

## 2020-10-10 NOTE — Progress Notes (Signed)
EPIC Encounter for ICM Monitoring  Patient Name: TRENISHA LAFAVOR is a 55 y.o. female Date: 10/10/2020 Primary Care Physican: Patient, No Pcp Per (Inactive) Primary Cardiologist: Hochrein/McLean Electrophysiologist: Allred Nephrologist: Dr Hollie Salk at [facility] 08/21/2020 Office Weight:  189 lbs           Attempted call to patient and unable to reach.  Left detailed message per DPR regarding transmission. Transmission reviewed.    OptiVol Thoracic impedance suggesting normal fluid levels.   Prescribed: Torsemide 20 mg take 1 tablet as needed.   Labs: 08/21/2020 Creatinine 1.55, BUN 20, Potassium 4.0, Sodium 140, GFR 39 06/04/2020 Creatinine 1.63, BUN 19, Potassium 4.2, Sodium 138, GFR 37 05/23/2020 Creatinine 1.62, BUN 19, Potassium 4.4, Sodium 138, GFR 37  A complete set of results can be found in Results Review.   Recommendations:  Left voice mail with ICM number and encouraged to call if experiencing any fluid symptoms.   Follow-up plan: ICM clinic phone appointment on 11/12/2020.   91 day device clinic remote transmission 10/16/2020.     EP/ Cardiology Next Visit:  Recall 02/17/2021 with Dr Aundra Dubin.   Recall 08/01/2021 with Dr Lovena Le.   Copy of ICM check sent to Dr. Rayann Heman.    3 month ICM trend: 7/77/2022.    1 Year ICM trend:       [name], RN 10/10/2020 2:21 PM

## 2020-10-16 ENCOUNTER — Ambulatory Visit (INDEPENDENT_AMBULATORY_CARE_PROVIDER_SITE_OTHER): Payer: Medicare Other

## 2020-10-16 DIAGNOSIS — I429 Cardiomyopathy, unspecified: Secondary | ICD-10-CM | POA: Diagnosis not present

## 2020-10-16 DIAGNOSIS — Z95810 Presence of automatic (implantable) cardiac defibrillator: Secondary | ICD-10-CM

## 2020-10-16 LAB — CUP PACEART REMOTE DEVICE CHECK
Battery Remaining Longevity: 116 mo
Battery Voltage: 3.01 V
Brady Statistic RV Percent Paced: 0.32 %
Date Time Interrogation Session: 20220719012203
HighPow Impedance: 53 Ohm
Implantable Lead Implant Date: 20191203
Implantable Lead Location: 753860
Implantable Pulse Generator Implant Date: 20191203
Lead Channel Impedance Value: 285 Ohm
Lead Channel Impedance Value: 342 Ohm
Lead Channel Pacing Threshold Amplitude: 0.625 V
Lead Channel Pacing Threshold Pulse Width: 0.4 ms
Lead Channel Sensing Intrinsic Amplitude: 7.125 mV
Lead Channel Sensing Intrinsic Amplitude: 7.125 mV
Lead Channel Setting Pacing Amplitude: 2.5 V
Lead Channel Setting Pacing Pulse Width: 0.4 ms
Lead Channel Setting Sensing Sensitivity: 0.3 mV

## 2020-11-08 NOTE — Progress Notes (Signed)
Remote ICD transmission.   

## 2020-11-12 ENCOUNTER — Ambulatory Visit (INDEPENDENT_AMBULATORY_CARE_PROVIDER_SITE_OTHER): Payer: Medicare Other

## 2020-11-12 DIAGNOSIS — I5022 Chronic systolic (congestive) heart failure: Secondary | ICD-10-CM

## 2020-11-12 DIAGNOSIS — Z95810 Presence of automatic (implantable) cardiac defibrillator: Secondary | ICD-10-CM

## 2020-11-16 NOTE — Progress Notes (Signed)
EPIC Encounter for ICM Monitoring  Patient Name: Erin Jarvis is a 55 y.o. female Date: 11/16/2020 Primary Care Physican: Patient, No Pcp Per (Inactive) Primary Cardiologist: Hochrein/McLean Electrophysiologist: Allred Nephrologist: Dr Hollie Salk at [facility] 08/21/2020 Office Weight:  189 lbs           Transmission reviewed.    OptiVol Thoracic impedance suggesting normal fluid levels.   Prescribed: Torsemide 20 mg take 1 tablet as needed.   Labs: 08/21/2020 Creatinine 1.55, BUN 20, Potassium 4.0, Sodium 140, GFR 39 06/04/2020 Creatinine 1.63, BUN 19, Potassium 4.2, Sodium 138, GFR 37 05/23/2020 Creatinine 1.62, BUN 19, Potassium 4.4, Sodium 138, GFR 37  A complete set of results can be found in Results Review.   Recommendations:  No changes   Follow-up plan: ICM clinic phone appointment on 12/17/2020.   91 day device clinic remote transmission 01/15/2021.     EP/ Cardiology Next Visit:  Recall 02/17/2021 with Dr Aundra Dubin.   Recall 08/01/2021 with Dr Lovena Le.   Copy of ICM check sent to Dr. Rayann Heman.     3 month ICM trend: 11/12/2020.    1 Year ICM trend:       [name], RN 11/16/2020 9:29 AM

## 2020-12-19 NOTE — Progress Notes (Signed)
No ICM remote transmission received for 12/17/2020 and next ICM transmission scheduled for 01/16/2021.

## 2021-01-18 ENCOUNTER — Telehealth: Payer: Self-pay

## 2021-01-18 NOTE — Telephone Encounter (Signed)
Received patient call.  She is unable to send ICM remote transmission due to monitor is not working. Handset battery needs recharging. Provided [employer] support tech number and advised to call to get handset replacement.  Provided ICM direct number and requested a call when she gets new handset and able to send remote transmission for fluid level review.

## 2021-01-23 NOTE — Progress Notes (Signed)
No ICM remote transmission received for 01/18/2021 and next ICM transmission scheduled for 02/05/2021.

## 2021-02-05 ENCOUNTER — Ambulatory Visit (INDEPENDENT_AMBULATORY_CARE_PROVIDER_SITE_OTHER): Payer: Medicare Other

## 2021-02-05 DIAGNOSIS — Z95810 Presence of automatic (implantable) cardiac defibrillator: Secondary | ICD-10-CM | POA: Diagnosis not present

## 2021-02-05 DIAGNOSIS — I5022 Chronic systolic (congestive) heart failure: Secondary | ICD-10-CM | POA: Diagnosis not present

## 2021-02-08 ENCOUNTER — Telehealth: Payer: Self-pay

## 2021-02-08 NOTE — Telephone Encounter (Signed)
Remote ICM transmission received.  Attempted call to patient regarding ICM remote transmission and left detailed message per DPR.  Advised to return call for any fluid symptoms or questions. Next ICM remote transmission scheduled 03/11/2021.

## 2021-02-08 NOTE — Progress Notes (Signed)
EPIC Encounter for ICM Monitoring  Patient Name: Erin Jarvis is a 55 y.o. female Date: 02/08/2021 Primary Care Physican: Patient, No Pcp Per (Inactive) Primary Cardiologist: H[facility] Electrophysiologist: [facility] Nephrologist: Dr Hollie Salk at [facility] 08/21/2020 Office Weight:  189 lbs           Attempted call to patient and unable to reach.  Left detailed message per DPR regarding transmission. Transmission reviewed.    OptiVol Thoracic impedance suggesting possible fluid accumulation starting 10/31 and trending back close to baseline.   Prescribed: Torsemide 20 mg take 1 tablet as needed.   Labs: 08/21/2020 Creatinine 1.55, BUN 20, Potassium 4.0, Sodium 140, GFR 39 06/04/2020 Creatinine 1.63, BUN 19, Potassium 4.2, Sodium 138, GFR 37 05/23/2020 Creatinine 1.62, BUN 19, Potassium 4.4, Sodium 138, GFR 37  A complete set of results can be found in Results Review.   Recommendations:  Left voice mail with ICM number and encouraged to call if experiencing any fluid symptoms.   Follow-up plan: ICM clinic phone appointment on 03/11/2021.   91 day device clinic remote transmission 04/16/2021.     EP/ Cardiology Next Visit:  Recall 02/17/2021 with Dr Aundra Dubin.   Recall 08/01/2021 with Dr Lovena Le.   Copy of ICM check sent to Dr. Rayann Heman.      3 month ICM trend: 02/05/2021.    1 Year ICM trend:       [name], RN 02/08/2021 2:49 PM

## 2021-02-15 ENCOUNTER — Other Ambulatory Visit (HOSPITAL_COMMUNITY): Payer: Self-pay | Admitting: Cardiology

## 2021-03-08 ENCOUNTER — Ambulatory Visit (INDEPENDENT_AMBULATORY_CARE_PROVIDER_SITE_OTHER): Payer: Medicare Other

## 2021-03-08 ENCOUNTER — Telehealth: Payer: Self-pay

## 2021-03-08 DIAGNOSIS — I429 Cardiomyopathy, unspecified: Secondary | ICD-10-CM | POA: Diagnosis not present

## 2021-03-08 DIAGNOSIS — Z95810 Presence of automatic (implantable) cardiac defibrillator: Secondary | ICD-10-CM

## 2021-03-08 LAB — CUP PACEART REMOTE DEVICE CHECK
Battery Remaining Longevity: 111 mo
Battery Voltage: 3.01 V
Brady Statistic RV Percent Paced: 0.2 %
Date Time Interrogation Session: 20221208121555
HighPow Impedance: 54 Ohm
Implantable Lead Implant Date: 20191203
Implantable Lead Location: 753860
Implantable Pulse Generator Implant Date: 20191203
Lead Channel Impedance Value: 266 Ohm
Lead Channel Impedance Value: 342 Ohm
Lead Channel Pacing Threshold Amplitude: 1 V
Lead Channel Pacing Threshold Pulse Width: 0.4 ms
Lead Channel Sensing Intrinsic Amplitude: 8.375 mV
Lead Channel Sensing Intrinsic Amplitude: 8.375 mV
Lead Channel Setting Pacing Amplitude: 2.5 V
Lead Channel Setting Pacing Pulse Width: 0.4 ms
Lead Channel Setting Sensing Sensitivity: 0.3 mV

## 2021-03-08 NOTE — Telephone Encounter (Signed)
Patient called to confirm remote transmission was received yesterday.  She received the new monitor and wanted to make sure it was working properly.  Reviewed remote transmission and advised fluid levels are normal. She is feeling well.  She received the letter regarding Dr Rayann Heman is leaving [facility] and would like to see Dr Lovena Le since he put in her last ICD.  Advised there is a reminder appointment for her to visit Dr Lovena Le in May 2023 and she said she is happy to drive to [address] office to see him.  No changes today and advised to call if she experiences any fluid symptoms.

## 2021-03-11 ENCOUNTER — Ambulatory Visit (INDEPENDENT_AMBULATORY_CARE_PROVIDER_SITE_OTHER): Payer: Medicare Other

## 2021-03-11 DIAGNOSIS — I5022 Chronic systolic (congestive) heart failure: Secondary | ICD-10-CM | POA: Diagnosis not present

## 2021-03-11 DIAGNOSIS — Z95810 Presence of automatic (implantable) cardiac defibrillator: Secondary | ICD-10-CM

## 2021-03-11 NOTE — Progress Notes (Signed)
EPIC Encounter for ICM Monitoring  Patient Name: Erin Jarvis is a 55 y.o. female Date: 03/11/2021 Primary Care Physican: Patient, No Pcp Per (Inactive) Primary Cardiologist: H[facility]rein/[facility] Electrophysiologist: Allred Nephrologist: Dr Hollie Salk at [facility] 08/21/2020 Office Weight:  189 lbs           Spoke with patient on 03/08/2021.  She is feeling well and received new monitor.   OptiVol Thoracic impedance suggesting normal fluid levels.   Prescribed: Torsemide 20 mg take 1 tablet as needed.   Labs: 08/21/2020 Creatinine 1.55, BUN 20, Potassium 4.0, Sodium 140, GFR 39 06/04/2020 Creatinine 1.63, BUN 19, Potassium 4.2, Sodium 138, GFR 37 05/23/2020 Creatinine 1.62, BUN 19, Potassium 4.4, Sodium 138, GFR 37  A complete set of results can be found in Results Review.   Recommendations:  No changes and encouraged to call if experiencing any fluid symptoms.   Follow-up plan: ICM clinic phone appointment on 04/15/2021.   91 day device clinic remote transmission 06/07/2021.     EP/ Cardiology Next Visit:  Recall 02/17/2021 with Dr Aundra Dubin.   Recall 08/01/2021 with Dr Lovena Le.   Copy of ICM check sent to Dr. Rayann Heman.    3 month ICM trend: 03/11/2021.    12-14 Month ICM trend:       [name], RN 03/11/2021 4:31 PM

## 2021-03-18 NOTE — Progress Notes (Signed)
Remote ICD transmission.   

## 2021-04-15 ENCOUNTER — Ambulatory Visit (INDEPENDENT_AMBULATORY_CARE_PROVIDER_SITE_OTHER): Payer: Medicare Other

## 2021-04-15 DIAGNOSIS — I5022 Chronic systolic (congestive) heart failure: Secondary | ICD-10-CM | POA: Diagnosis not present

## 2021-04-15 DIAGNOSIS — Z95810 Presence of automatic (implantable) cardiac defibrillator: Secondary | ICD-10-CM

## 2021-04-18 ENCOUNTER — Telehealth: Payer: Self-pay

## 2021-04-18 NOTE — Telephone Encounter (Signed)
Remote ICM transmission received.  Attempted call to patient regarding ICM remote transmission and mail box is full. 

## 2021-04-18 NOTE — Progress Notes (Signed)
EPIC Encounter for ICM Monitoring  Patient Name: Erin Jarvis is a 56 y.o. female Date: 04/18/2021 Primary Care Physican: Patient, No Pcp Per (Inactive) Primary Cardiologist: Hochrein/[facility] Electrophysiologist: Allred Nephrologist: Dr Hollie Salk at [facility] 08/21/2020 Office Weight:  189 lbs           Attempted call to patient and unable to reach.   Transmission reviewed.    OptiVol Thoracic impedance suggesting normal fluid levels.   Prescribed: Torsemide 20 mg take 1 tablet as needed.   Labs: 08/21/2020 Creatinine 1.55, BUN 20, Potassium 4.0, Sodium 140, GFR 39 06/04/2020 Creatinine 1.63, BUN 19, Potassium 4.2, Sodium 138, GFR 37 05/23/2020 Creatinine 1.62, BUN 19, Potassium 4.4, Sodium 138, GFR 37  A complete set of results can be found in Results Review.   Recommendations:  Unable to reach.     Follow-up plan: ICM clinic phone appointment on 05/20/2021.   91 day device clinic remote transmission 06/07/2021.     EP/ Cardiology Next Visit:  Recall 02/17/2021 with Dr Aundra Dubin.   Recall 08/01/2021 with Dr Lovena Le.   Copy of ICM check sent to Dr. Rayann Heman.    3 month ICM trend: 04/15/2021.    12-14 Month ICM trend:     [name], RN 04/18/2021 10:44 AM

## 2021-05-20 ENCOUNTER — Ambulatory Visit (INDEPENDENT_AMBULATORY_CARE_PROVIDER_SITE_OTHER): Payer: Medicare Other

## 2021-05-20 DIAGNOSIS — I5022 Chronic systolic (congestive) heart failure: Secondary | ICD-10-CM

## 2021-05-20 DIAGNOSIS — Z95810 Presence of automatic (implantable) cardiac defibrillator: Secondary | ICD-10-CM

## 2021-05-21 ENCOUNTER — Telehealth: Payer: Self-pay

## 2021-05-21 NOTE — Progress Notes (Signed)
EPIC Encounter for ICM Monitoring  Patient Name: Erin Jarvis is a 56 y.o. female Date: 05/21/2021 Primary Care Physican: Patient, No Pcp Per (Inactive) Primary Cardiologist: Hochrein/[facility] Electrophysiologist: Allred Nephrologist: Dr Hollie Salk at [facility] 08/21/2020 Office Weight:  189 lbs           Attempted call to patient and unable to reach.  Left detailed message per DPR regarding transmission. Transmission reviewed.    OptiVol Thoracic impedance suggesting possible fluid accumulation starting 2/13.   Prescribed: Torsemide 20 mg take 1 tablet as needed.   Labs: 08/21/2020 Creatinine 1.55, BUN 20, Potassium 4.0, Sodium 140, GFR 39 06/04/2020 Creatinine 1.63, BUN 19, Potassium 4.2, Sodium 138, GFR 37 05/23/2020 Creatinine 1.62, BUN 19, Potassium 4.4, Sodium 138, GFR 37  A complete set of results can be found in Results Review.   Recommendations:  Left voice mail with ICM number and encouraged to call if experiencing any fluid symptoms.   Follow-up plan: ICM clinic phone appointment on 05/24/2021 to recheck fluid levels.   91 day device clinic remote transmission 06/07/2021.     EP/ Cardiology Next Visit:  Recall 02/17/2021 with Dr Aundra Dubin.   Recall 08/01/2021 with Dr Lovena Le.   Copy of ICM check sent to Dr. Rayann Heman.  Will send to Dr Aundra Dubin for review if needed when patient is reached.   3 month ICM trend: 05/20/2021.    12-14 Month ICM trend:     [name], RN 05/21/2021 8:13 AM

## 2021-05-21 NOTE — Telephone Encounter (Signed)
Remote ICM transmission received.  Attempted call to patient regarding ICM remote transmission and left detailed message per DPR.  Advised to return call for any fluid symptoms or questions. Next ICM remote transmission scheduled 05/24/2021.

## 2021-05-24 ENCOUNTER — Ambulatory Visit (INDEPENDENT_AMBULATORY_CARE_PROVIDER_SITE_OTHER): Payer: Medicare Other

## 2021-05-24 ENCOUNTER — Telehealth: Payer: Self-pay

## 2021-05-24 DIAGNOSIS — Z95810 Presence of automatic (implantable) cardiac defibrillator: Secondary | ICD-10-CM

## 2021-05-24 DIAGNOSIS — I5022 Chronic systolic (congestive) heart failure: Secondary | ICD-10-CM

## 2021-05-24 NOTE — Telephone Encounter (Signed)
Remote ICM transmission received.  Attempted call to patient regarding ICM remote transmission and mail box is full. 

## 2021-05-24 NOTE — Progress Notes (Signed)
EPIC Encounter for ICM Monitoring  Patient Name: Erin Jarvis is a 56 y.o. female Date: 05/24/2021 Primary Care Physican: Patient, No Pcp Per (Inactive) Primary Cardiologist: Hochrein/[facility] Electrophysiologist: Allred Nephrologist: Dr Hollie Salk at [facility] Last Weight:  189 lbs           Attempted call to patient and unable to reach, mail box is full. Transmission reviewed.    OptiVol Thoracic impedance suggesting fluid levels returned to normal for 1 day followed by possible fluid accumulation restarted.   Prescribed: Torsemide 20 mg take 1 tablet as needed.   Labs: 08/21/2020 Creatinine 1.55, BUN 20, Potassium 4.0, Sodium 140, GFR 39 06/04/2020 Creatinine 1.63, BUN 19, Potassium 4.2, Sodium 138, GFR 37 05/23/2020 Creatinine 1.62, BUN 19, Potassium 4.4, Sodium 138, GFR 37  A complete set of results can be found in Results Review.   Recommendations:  Unable to reach.     Follow-up plan: ICM clinic phone appointment on 05/28/2021 (manual) to recheck fluid levels.   91 day device clinic remote transmission 06/07/2021.     EP/ Cardiology Next Visit:  Recall 02/17/2021 with Dr Aundra Dubin.   Recall 08/01/2021 with Dr Lovena Le.   Copy of ICM check sent to Dr. Rayann Heman.  Will send to Dr Aundra Dubin for review if needed when patient is reached.    3 month ICM trend: 05/24/2021.    12-14 Month ICM trend:     [name], RN 05/24/2021 12:13 PM

## 2021-05-31 NOTE — Progress Notes (Signed)
No ICM remote transmission received for 05/28/2021 and next ICM transmission scheduled for 06/07/2021.   ?

## 2021-06-07 ENCOUNTER — Ambulatory Visit (INDEPENDENT_AMBULATORY_CARE_PROVIDER_SITE_OTHER): Payer: Medicare Other

## 2021-06-07 DIAGNOSIS — I5022 Chronic systolic (congestive) heart failure: Secondary | ICD-10-CM

## 2021-06-07 DIAGNOSIS — Z95810 Presence of automatic (implantable) cardiac defibrillator: Secondary | ICD-10-CM

## 2021-06-07 LAB — CUP PACEART REMOTE DEVICE CHECK
Date Time Interrogation Session: 20230310070031
Implantable Lead Implant Date: 20191203
Implantable Lead Location: 753860
Implantable Pulse Generator Implant Date: 20191203

## 2021-06-07 NOTE — Progress Notes (Signed)
EPIC Encounter for ICM Monitoring ? ?Patient Name: Erin Jarvis is a 56 y.o. female ?Date: 06/07/2021 ?Primary Care Physican: Patient, No Pcp Per (Inactive) ?Primary Cardiologist: Hochrein/McLean ?Electrophysiologist: Allred ?Nephrologist: Dr Hollie Salk at [facility] ?Last Weight:  189 lbs ?  ?  ?      Transmission reviewed.  ?  ?OptiVol Thoracic impedance suggesting fluid levels returned to normal. ?  ?Prescribed: Torsemide 20 mg take 1 tablet as needed. ?  ?Labs: ?08/21/2020 Creatinine 1.55, BUN 20, Potassium 4.0, Sodium 140, GFR 39 ?06/04/2020 Creatinine 1.63, BUN 19, Potassium 4.2, Sodium 138, GFR 37 ?05/23/2020 Creatinine 1.62, BUN 19, Potassium 4.4, Sodium 138, GFR 37  ?A complete set of results can be found in Results Review. ?  ?Recommendations:  No changes  ?  ?Follow-up plan: ICM clinic phone appointment on 07/01/2021.   91 day device clinic remote transmission 09/06/2021.   ?  ?EP/ Cardiology Next Visit:  Recall 02/17/2021 with Dr Aundra Dubin.   Recall 08/01/2021 with Dr Lovena Le. ?  ?Copy of ICM check sent to Dr. Rayann Heman.   ? ?3 month ICM trend: 06/07/2021. ? ? ? ?12-14 Month ICM trend:  ? ? ? ?[name], RN ?06/07/2021 ?8:01 AM ? ?

## 2021-06-12 NOTE — Progress Notes (Signed)
Remote ICD transmission.   

## 2021-06-21 ENCOUNTER — Other Ambulatory Visit (HOSPITAL_COMMUNITY): Payer: Self-pay | Admitting: Cardiology

## 2021-07-01 ENCOUNTER — Ambulatory Visit (INDEPENDENT_AMBULATORY_CARE_PROVIDER_SITE_OTHER): Payer: Medicare Other

## 2021-07-01 DIAGNOSIS — I5022 Chronic systolic (congestive) heart failure: Secondary | ICD-10-CM | POA: Diagnosis not present

## 2021-07-01 DIAGNOSIS — Z95810 Presence of automatic (implantable) cardiac defibrillator: Secondary | ICD-10-CM | POA: Diagnosis not present

## 2021-07-03 NOTE — Progress Notes (Signed)
EPIC Encounter for ICM Monitoring ? ?Patient Name: Erin Jarvis is a 56 y.o. female ?Date: 07/03/2021 ?Primary Care Physican: Patient, No Pcp Per (Inactive) ?Primary Cardiologist: Hochrein/McLean ?Electrophysiologist: Allred ?Nephrologist: Dr Hollie Salk at [facility] ?07/03/2021 Weight:  200 lbs ?  ?  ?      Spoke with patient and heart failure questions reviewed.  Pt asymptomatic for fluid accumulation.  Reports feeling well at this time and voices no complaints.  ?  ?OptiVol Thoracic impedance suggesting normal fluid levels. ?  ?Prescribed: Torsemide 20 mg take 1 tablet as needed. ?  ?Labs: ?08/21/2020 Creatinine 1.55, BUN 20, Potassium 4.0, Sodium 140, GFR 39 ?06/04/2020 Creatinine 1.63, BUN 19, Potassium 4.2, Sodium 138, GFR 37 ?05/23/2020 Creatinine 1.62, BUN 19, Potassium 4.4, Sodium 138, GFR 37  ?A complete set of results can be found in Results Review. ?  ?Recommendations:  No changes and encouraged to call if experiencing any fluid symptoms. ?  ?Follow-up plan: ICM clinic phone appointment on 08/05/2021.   91 day device clinic remote transmission 09/06/2021.   ?  ?EP/ Cardiology Next Visit:  She will call to schedule overdue appointment with Dr Aundra Dubin.  Recall 02/17/2021 with Dr Aundra Dubin.   Recall 08/01/2021 with Dr Lovena Le. ?  ?Copy of ICM check sent to Dr. Rayann Heman.    ? ?3 month ICM trend: 07/01/2021. ? ? ? ?12-14 Month ICM trend:  ? ? ? ?[name], RN ?07/03/2021 ?5:02 PM ? ?

## 2021-07-17 ENCOUNTER — Other Ambulatory Visit (HOSPITAL_COMMUNITY): Payer: Self-pay | Admitting: Cardiology

## 2021-07-22 ENCOUNTER — Telehealth (HOSPITAL_COMMUNITY): Payer: Self-pay

## 2021-07-22 NOTE — Telephone Encounter (Signed)
Error

## 2021-08-05 ENCOUNTER — Ambulatory Visit (INDEPENDENT_AMBULATORY_CARE_PROVIDER_SITE_OTHER): Payer: Medicare Other

## 2021-08-05 DIAGNOSIS — Z95810 Presence of automatic (implantable) cardiac defibrillator: Secondary | ICD-10-CM | POA: Diagnosis not present

## 2021-08-05 DIAGNOSIS — I5022 Chronic systolic (congestive) heart failure: Secondary | ICD-10-CM

## 2021-08-08 ENCOUNTER — Telehealth: Payer: Self-pay

## 2021-08-08 NOTE — Telephone Encounter (Signed)
Remote ICM transmission received.  Attempted call to patient regarding ICM remote transmission and mail box is full. 

## 2021-08-08 NOTE — Progress Notes (Signed)
EPIC Encounter for ICM Monitoring ? ?Patient Name: Erin Jarvis is a 57 y.o. female ?Date: 08/08/2021 ?Primary Care Physican: Patient, No Pcp Per (Inactive) ?Primary Cardiologist: Hochrein/McLean ?Electrophysiologist: Allred ?Nephrologist: Dr Hollie Salk at [facility] ?07/03/2021 Weight:  200 lbs ?  ?  ?      Attempted call to patient and unable to reach. Transmission reviewed.  ?  ?OptiVol Thoracic impedance suggesting normal fluid levels. ?  ?Prescribed: Torsemide 20 mg take 1 tablet as needed. ?  ?Labs: ?06/06/2021 Creatinine 1.63, BUN 26, Potassium 4.1, Sodium 142, GFR 36 ?A complete set of results can be found in Results Review. ?  ?Recommendations:  Unable to reach.   ?  ?Follow-up plan: ICM clinic phone appointment on 09/09/2021.   91 day device clinic remote transmission 09/06/2021.   ?  ?EP/ Cardiology Next Visit:  Pt aware to call Dr Claris Gladden office to schedule overdue appointment.  Recall 02/17/2021 with Dr Aundra Dubin.   Recall 08/01/2021 with Dr Lovena Le. ?  ?Copy of ICM check sent to Dr. Rayann Heman.    ? ?3 month ICM trend: 08/05/2021. ? ? ? ?12-14 Month ICM trend:  ? ? ? ?[name], RN ?08/08/2021 ?8:29 AM ? ?

## 2021-08-30 ENCOUNTER — Other Ambulatory Visit (HOSPITAL_COMMUNITY): Payer: Self-pay | Admitting: Cardiology

## 2021-09-06 ENCOUNTER — Ambulatory Visit (INDEPENDENT_AMBULATORY_CARE_PROVIDER_SITE_OTHER): Payer: Medicare Other

## 2021-09-06 DIAGNOSIS — I429 Cardiomyopathy, unspecified: Secondary | ICD-10-CM

## 2021-09-06 LAB — CUP PACEART REMOTE DEVICE CHECK
Battery Remaining Longevity: 105 mo
Battery Voltage: 3.01 V
Brady Statistic RV Percent Paced: 0.6 %
Date Time Interrogation Session: 20230609012303
HighPow Impedance: 58 Ohm
Implantable Lead Implant Date: 20191203
Implantable Lead Location: 753860
Implantable Pulse Generator Implant Date: 20191203
Lead Channel Impedance Value: 304 Ohm
Lead Channel Impedance Value: 399 Ohm
Lead Channel Pacing Threshold Amplitude: 0.625 V
Lead Channel Pacing Threshold Pulse Width: 0.4 ms
Lead Channel Sensing Intrinsic Amplitude: 6.75 mV
Lead Channel Sensing Intrinsic Amplitude: 6.75 mV
Lead Channel Setting Pacing Amplitude: 2.5 V
Lead Channel Setting Pacing Pulse Width: 0.4 ms
Lead Channel Setting Sensing Sensitivity: 0.3 mV

## 2021-09-09 ENCOUNTER — Ambulatory Visit (INDEPENDENT_AMBULATORY_CARE_PROVIDER_SITE_OTHER): Payer: Medicare Other

## 2021-09-09 DIAGNOSIS — Z95810 Presence of automatic (implantable) cardiac defibrillator: Secondary | ICD-10-CM | POA: Diagnosis not present

## 2021-09-09 DIAGNOSIS — I5022 Chronic systolic (congestive) heart failure: Secondary | ICD-10-CM | POA: Diagnosis not present

## 2021-09-10 ENCOUNTER — Other Ambulatory Visit (HOSPITAL_COMMUNITY): Payer: Self-pay | Admitting: Cardiology

## 2021-09-12 NOTE — Progress Notes (Signed)
Remote ICD transmission.   

## 2021-09-13 ENCOUNTER — Telehealth: Payer: Self-pay

## 2021-09-13 NOTE — Telephone Encounter (Signed)
Remote ICM transmission received.  Attempted call to patient regarding ICM remote transmission and no answer.  Mail box is full. 

## 2021-09-13 NOTE — Progress Notes (Signed)
EPIC Encounter for ICM Monitoring  Patient Name: Erin Jarvis is a 56 y.o. female Date: 09/13/2021 Primary Care Physican: Patient, No Pcp Per Primary Cardiologist: Hochrein/McLean Electrophysiologist: Allred Nephrologist: Dr Hollie Salk at [facility] 07/03/2021 Weight:  200 lbs           Attempted call to patient and unable to reach. Transmission reviewed.    OptiVol Thoracic impedance suggesting normal fluid levels.   Prescribed: Torsemide 20 mg take 1 tablet as needed.   Labs: 06/06/2021 Creatinine 1.63, BUN 26, Potassium 4.1, Sodium 142, GFR 36 A complete set of results can be found in Results Review.   Recommendations:  Unable to reach.     Follow-up plan: ICM clinic phone appointment on 10/14/2021.   91 day device clinic remote transmission 12/06/2021.     EP/ Cardiology Next Visit:  Pt aware to call Dr Claris Gladden office to schedule overdue appointment.  Recall 02/17/2021 with Dr Aundra Dubin.   Recall 08/01/2021 with Dr Lovena Le.   Copy of ICM check sent to Dr. Rayann Heman.      3 month ICM trend: 09/09/2021.    12-14 Month ICM trend:     [name], RN 09/13/2021 1:45 PM

## 2021-10-14 ENCOUNTER — Ambulatory Visit (INDEPENDENT_AMBULATORY_CARE_PROVIDER_SITE_OTHER): Payer: Medicare Other

## 2021-10-14 DIAGNOSIS — Z95810 Presence of automatic (implantable) cardiac defibrillator: Secondary | ICD-10-CM

## 2021-10-14 DIAGNOSIS — I5022 Chronic systolic (congestive) heart failure: Secondary | ICD-10-CM

## 2021-10-18 NOTE — Progress Notes (Signed)
EPIC Encounter for ICM Monitoring  Patient Name: Erin Jarvis is a 56 y.o. female Date: 10/18/2021 Primary Care Physican: Patient, No Pcp Per Primary Cardiologist: Hochrein/McLean Electrophysiologist: Allred Nephrologist: Dr Hollie Salk at [facility] 07/03/2021 Weight:  200 lbs           Attempted call to patient and unable to reach. Transmission reviewed.    OptiVol Thoracic impedance suggesting normal fluid levels.   Prescribed: Torsemide 20 mg take 1 tablet as needed.   Labs: 06/06/2021 Creatinine 1.63, BUN 26, Potassium 4.1, Sodium 142, GFR 36 A complete set of results can be found in Results Review.   Recommendations:  Unable to reach.     Follow-up plan: ICM clinic phone appointment on 11/18/2021.   91 day device clinic remote transmission 12/06/2021.     EP/ Cardiology Next Visit:  11/20/2021 with Dr Aundra Dubin.    Recall 08/01/2021 with Dr Lovena Le.   Copy of ICM check sent to Dr. Rayann Heman.     3 month ICM trend: 10/14/2021.    12-14 Month ICM trend:     [name], RN 10/18/2021 2:48 PM

## 2021-10-21 ENCOUNTER — Other Ambulatory Visit (HOSPITAL_COMMUNITY): Payer: Self-pay | Admitting: *Deleted

## 2021-10-21 MED ORDER — ENTRESTO 24-26 MG PO TABS: 1.0000 | ORAL_TABLET | Freq: Two times a day (BID) | ORAL | 3 refills | Status: DC

## 2021-10-21 MED ORDER — LEVOTHYROXINE SODIUM 25 MCG PO TABS: ORAL_TABLET | ORAL | 3 refills | Status: DC

## 2021-11-13 ENCOUNTER — Telehealth (HOSPITAL_COMMUNITY): Payer: Self-pay | Admitting: Pharmacy Technician

## 2021-11-13 ENCOUNTER — Encounter (HOSPITAL_COMMUNITY): Payer: Self-pay

## 2021-11-13 NOTE — Telephone Encounter (Signed)
Advanced Heart Failure Patient Advocate Encounter  Patient left message requesting Eliquis samples, due to lapse in insurance. Patient should be getting new insurance next month. Called to confirm the date of new insurance start and status of other hf medications. Patient's vm is full, no way to leave message.  Charlann Boxer, CPhT

## 2021-11-13 NOTE — Progress Notes (Unsigned)
Medication Samples have been provided to the patient.  Drug name: Eliquis       Strength: 5 mg        Qty: 4  LOT: [id number]  Exp.Date: 06/2023  Dosing instructions: Take 1 tablet Twice daily   The patient has been instructed regarding the correct time, dose, and frequency of taking this medication, including desired effects and most common side effects.   Erin Jarvis 4:11 PM 11/13/2021

## 2021-11-19 ENCOUNTER — Ambulatory Visit (INDEPENDENT_AMBULATORY_CARE_PROVIDER_SITE_OTHER): Payer: Medicare Other

## 2021-11-19 DIAGNOSIS — Z95810 Presence of automatic (implantable) cardiac defibrillator: Secondary | ICD-10-CM | POA: Diagnosis not present

## 2021-11-19 DIAGNOSIS — I5022 Chronic systolic (congestive) heart failure: Secondary | ICD-10-CM | POA: Diagnosis not present

## 2021-11-20 ENCOUNTER — Encounter (HOSPITAL_COMMUNITY): Payer: Self-pay | Admitting: Cardiology

## 2021-11-20 ENCOUNTER — Ambulatory Visit (HOSPITAL_COMMUNITY)
Admission: RE | Admit: 2021-11-20 | Discharge: 2021-11-20 | Disposition: A | Discharge status: Home / Self Care | Payer: Medicare Other | Source: Ambulatory Visit | Attending: Cardiology | Admitting: Cardiology

## 2021-11-20 ENCOUNTER — Other Ambulatory Visit (HOSPITAL_COMMUNITY): Payer: Self-pay

## 2021-11-20 VITALS — BP 118/80 | HR 65 | Wt 199.8 lb

## 2021-11-20 DIAGNOSIS — N261 Atrophy of kidney (terminal): Secondary | ICD-10-CM | POA: Insufficient documentation

## 2021-11-20 DIAGNOSIS — Z7901 Long term (current) use of anticoagulants: Secondary | ICD-10-CM | POA: Insufficient documentation

## 2021-11-20 DIAGNOSIS — E1122 Type 2 diabetes mellitus with diabetic chronic kidney disease: Secondary | ICD-10-CM | POA: Insufficient documentation

## 2021-11-20 DIAGNOSIS — G4733 Obstructive sleep apnea (adult) (pediatric): Secondary | ICD-10-CM | POA: Diagnosis not present

## 2021-11-20 DIAGNOSIS — I4891 Unspecified atrial fibrillation: Secondary | ICD-10-CM | POA: Insufficient documentation

## 2021-11-20 DIAGNOSIS — Z79899 Other long term (current) drug therapy: Secondary | ICD-10-CM | POA: Diagnosis not present

## 2021-11-20 DIAGNOSIS — I4892 Unspecified atrial flutter: Secondary | ICD-10-CM | POA: Diagnosis not present

## 2021-11-20 DIAGNOSIS — I130 Hypertensive heart and chronic kidney disease with heart failure and stage 1 through stage 4 chronic kidney disease, or unspecified chronic kidney disease: Secondary | ICD-10-CM | POA: Diagnosis not present

## 2021-11-20 DIAGNOSIS — N183 Chronic kidney disease, stage 3 unspecified: Secondary | ICD-10-CM | POA: Diagnosis not present

## 2021-11-20 DIAGNOSIS — I5022 Chronic systolic (congestive) heart failure: Secondary | ICD-10-CM | POA: Diagnosis present

## 2021-11-20 DIAGNOSIS — Z7984 Long term (current) use of oral hypoglycemic drugs: Secondary | ICD-10-CM | POA: Diagnosis not present

## 2021-11-20 DIAGNOSIS — I480 Paroxysmal atrial fibrillation: Secondary | ICD-10-CM | POA: Diagnosis not present

## 2021-11-20 LAB — BASIC METABOLIC PANEL
Anion gap: 10 (ref 5–15)
BUN: 29 mg/dL — ABNORMAL HIGH (ref 6–20)
CO2: 18 mmol/L — ABNORMAL LOW (ref 22–32)
Calcium: 9.4 mg/dL (ref 8.9–10.3)
Chloride: 112 mmol/L — ABNORMAL HIGH (ref 98–111)
Creatinine, Ser: 1.78 mg/dL — ABNORMAL HIGH (ref 0.44–1.00)
GFR, Estimated: 33 mL/min — ABNORMAL LOW (ref >60)
Glucose, Bld: 91 mg/dL (ref 70–99)
Potassium: 4.2 mmol/L (ref 3.5–5.1)
Sodium: 140 mmol/L (ref 135–145)

## 2021-11-20 LAB — LIPID PANEL
Cholesterol: 185 mg/dL (ref 0–200)
HDL: 49 mg/dL (ref >40)
LDL Cholesterol: 112 mg/dL — ABNORMAL HIGH (ref 0–99)
Total CHOL/HDL Ratio: 3.8 RATIO
Triglycerides: 119 mg/dL (ref <150)
VLDL: 24 mg/dL (ref 0–40)

## 2021-11-20 NOTE — Patient Instructions (Signed)
There has been no changes to your medications.  Labs done today, your results will be available in MyChart, we will contact you for abnormal readings.  Your physician has requested that you have an echocardiogram. Echocardiography is a painless test that uses sound waves to create images of your heart. It provides your doctor with information about the size and shape of your heart and how well your heart's chambers and valves are working. This procedure takes approximately one hour. There are no restrictions for this procedure.  Your physician recommends that you schedule a follow-up appointment in: 1 year ( August 2024) ** please call the office in May 2024 to arrange your follow up appointment **  If you have any questions or concerns before your next appointment please send [place] a message through [place] or call our office at [phone].    TO LEAVE A MESSAGE FOR THE NURSE SELECT OPTION 2, PLEASE LEAVE A MESSAGE INCLUDING: YOUR NAME DATE OF BIRTH CALL BACK NUMBER REASON FOR CALL**this is important as we prioritize the call backs  YOU WILL RECEIVE A CALL BACK THE SAME DAY AS LONG AS YOU CALL BEFORE 4:00 PM  At the [facility], you and your health needs are our priority. As part of our continuing mission to provide you with exceptional heart care, we have created designated Provider Care Teams. These Care Teams include your primary Cardiologist (physician) and Advanced Practice Providers (APPs- Physician Assistants and Nurse Practitioners) who all work together to provide you with the care you need, when you need it.   You may see any of the following providers on your designated Care Team at your next follow up: Dr Glori Bickers Dr Haynes Kerns, NP Lyda Jester, [place] [facility] [place], [place] Audry Riles, PharmD   Please be sure to bring in all your medications bottles to every appointment.

## 2021-11-20 NOTE — Progress Notes (Signed)
PCP: Dr. Sherrie Sport HF Cardiology: Dr. Aundra Dubin  [name] is a 56 y.o. 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 At one point EF improved to 45% from 20%.  She also has a history VT with Medtronic ICD placed in Erin Jarvis. In 12/17, she was  admitted to [facility] with new onset atrial fibrillation/RVR and volume overload. Required IV diuresis and cardizem drip.  She remained in atrial fibrillation.  Warfarin was started that admission.    On 03/26/16, she saw Dr Erin Jarvis and she was set up TEE/DCCV. Creatinine at that time was 2.03.  She had had a functional decline and dyspnea at rest. Weight at home had been trending up from 290 to 309 pounds.  SBP in 90s at Dr. Rosezella Jarvis office.    On 04/08/16, she presented for scheduled TEE/DC-CV, however thrombus was noted in LA appendage so DC-CV was not pursued. TEE also showed severe LV dysfunction. Dyspneic at rest. SBP soft.  She was admitted and ultimately required initiation of milrinone gtt + diuresis with IV Lasix and metolazone.  She lost > 20 lbs.  We were able to titrate her off milrinone and she was sent home on torsemide.  Rate was difficult to control, Toprol XL was titrated up to 75 mg bid.   At appointment in 2/18, she was noted to be back in NSR.  She remains in NSR today.   She had gastric bypass in 3/19 at [facility].   She saw Dr. Rayann Jarvis for evaluation for possible afib ablation.  He recommended initial weight loss + amiodarone, then try to stop amiodarone after she has lost a fair amount of weight. She lost weight and is now off amiodarone.  Echo in 8/19 showed EF 40-45%.  Echo in 8/20 showed EF 40-45%, normal RV.  Echo in 5/22 showed EF 50% with mild diffuse hypokinesis and normal RV.   She returns for followup of CHF.  No significant exertional dyspnea.  No chest pain.  No melena/BRBPR. No palpitations.  She is in NSR today.  Weight is up about 10 lbs compared to a year ago.  She notes "weak  spells" when her blood glucose drops to the 50s.  Main complaint is left knee pain from arthritis.   Labs (1/18): digoxin 0.5, K 4.5, creatinine 2.13, digoxin 1.7, AST 53, ALT 34, TSH elevated Labs (2/18): K 3.9, creatinine 1.98 => 1.6, TSH 14, normal free T3 and free T4, LFTs normal, digoxin 1.7 Labs (3/18): K 3.8, creatinine 1.8, digoxin 1.0 Labs (12/18): K 4, creatinine 1.62 Labs (3/19): creatinine 2.06 Labs (7/19): K 4, creatinine 1.66 Labs (8/19): hgb 12.7 Labs (9/19): K 4.4, creatinine 1.44 Labs (12/19): K 3.8, creatinine 1.61 Labs (2/20): K 3.6, creatinine 1.7 Labs (7/20): K 4, creatinine 2.39, TSH low, free T4 high, T3 normal Labs (11/20): K 3.6, creatinine 1.42 Labs (3/22): K 4.2, creatinine 1.63 Labs (3/23): K 4.1, creatinine 1.63, hgb 13.9  Medtronic device interrogated: Stable thoracic impedance, no AF/VT.   ECG (personally reviewed): NSR 57, anteroseptal Qs  PMH: 1. Chronic systolic CHF: Nonischemic cardiomyopathy.  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  EF initially 20%, rose to 45% at one point.   - h/o VT => Medtronic ICD.  - Echo (1/18): EF 25-30%, mildly dilated RV with normal systolic function, moderate TR, PASP 31 mmHg.  - LHC/RHC (1/18): No CAD; mean RA 19, PA 46/20 mean 35, mean PCWP 24, CI 2.37 (Thermo), CI 3.16 (Fick), PVR  1.9 WU.  - Echo (8/19): EF 40-45%, severe LV dilation, no significant MR - Echo (8/20): EF 40-45%, normal RV - Echo (5/22): EF 50% with mild diffuse hypokinesis and normal RV. 2. Atrial fibrillation: Persistent.  Diagnosed 12/17 when she presented to [facility] with afib/RVR.  TEE/DCCV set up in 1/18 but no DCCV due to LA thrombus on TEE.   3. Gout 4. Type II diabetes 5. HTN 6. OSA: Uses CPAP 7. CKD stage III: Has history of atrophic left kidney  8. Hypothyroidism 9. VT: 1/18, had ICD discharge.  10. Gastric bypass 3/19.  11. OA 12. Gout  SH: Divorced, on disability, lives with parents in [place],  nonsmoker, no ETOH, no drugs.   Family History  Problem Relation Age of Onset   Other Mother        has chronic pain syndrome from DDD with spinal cord stimulator   Hypertension Mother    COPD Father    Arthritis/Rheumatoid Father    ROS: All systems reviewed and negative except as per HPI.   Current Outpatient Medications  Medication Sig Dispense Refill   acetaminophen (TYLENOL) 500 MG tablet Take 1,000 mg by mouth every 6 (six) hours as needed for mild pain or moderate pain.     Calcium Citrate-Vitamin D 250-200 MG-UNIT TABS Take 1 tablet by mouth daily.     diphenhydramine-acetaminophen (TYLENOL PM) 25-500 MG TABS tablet Take 1 tablet by mouth at bedtime as needed (sleep).     ELIQUIS 5 MG TABS tablet TAKE 1 TABLET(5 MG) BY MOUTH TWICE DAILY 60 tablet 11   FARXIGA 10 MG TABS tablet TAKE 1 TABLET(10 MG) BY MOUTH DAILY BEFORE BREAKFAST 90 tablet 3   ferrous sulfate 325 (65 FE) MG tablet Take 325 mg by mouth daily with breakfast.     levothyroxine (SYNTHROID) 25 MCG tablet TAKE 1 TABLET(25 MCG) BY MOUTH DAILY BEFORE BREAKFAST 30 tablet 3   Melatonin 1 MG CHEW Chew 1 tablet by mouth as needed.     metoprolol succinate (TOPROL-XL) 100 MG 24 hr tablet TAKE 1 TABLET(100 MG) BY MOUTH TWICE DAILY WITH OR IMMEDIATELY FOLLOWING A MEAL 180 tablet 3   Multiple Vitamin (MULTIVITAMIN) tablet Take 1 tablet by mouth daily.     sacubitril-valsartan (ENTRESTO) 24-26 MG Take 1 tablet by mouth 2 (two) times daily. 180 tablet 3   torsemide (DEMADEX) 20 MG tablet Take 1 tablet (20 mg total) by mouth as needed. 30 tablet 5   vitamin B-12 (CYANOCOBALAMIN) 1000 MCG tablet Take 1,000 mcg by mouth daily.     No current facility-administered medications for this encounter.   BP 118/80   Pulse 65   Wt 90.6 kg (199 lb 12.8 oz)   LMP 05/30/2013 (Approximate)   SpO2 99%   BMI 31.29 kg/m  General: NAD Neck: No JVD, no thyromegaly or thyroid nodule.  Lungs: Clear to auscultation bilaterally with normal  respiratory effort. CV: Nondisplaced PMI.  Heart regular S1/S2, no S3/S4, no murmur.  No peripheral edema.  No carotid bruit.  Normal pedal pulses.  Abdomen: Soft, nontender, no hepatosplenomegaly, no distention.  Skin: Intact without lesions or rashes.  Neurologic: Alert and oriented x 3.  Psych: Normal affect. Extremities: No clubbing or cyanosis.  HEENT: Normal.   Assessment/Plan: 1. Chronic systolic CHF: Echo 5/02 with EF 25-30%, nonischemic cardiomyopathy.  Cardiomyopathy known since 87.  Possibly peri-partum. SPEP negative.  Has Medtronic ICD.  LHC (1/18) with no coronary disease.  She was admitted in 1/18 with extensive  diuresis, required milrinone due to low output but was able to titrate off.  When RHC was done, cardiac output was ok off milrinone. Echo in 5/22 showed EF up to 50%.  NYHA class I-II symptoms now, she is not volume overloaded on exam.   - She can continue to use torsemide prn (has not needed).   - Continue Toprol XL 100 mg bid.    - Continue Entresto 24/26 bid.  - Continue Farxiga 10 mg daily.  - BMET today.  - I will arrange for repeat echo, mildly decreased LV systolic function on 7616 echo.  2. Atrial flutter/fibrillation:  Noted in 12/17.  TEE in 1/18 showed LA appendage thrombus. She is now on Eliquis. Suspect atrial fibrillation with RVR drove her last CHF decompensation.  She remains in NSR today, now off amiodarone. Weight loss likely will help her maintain NSR.  - If atrial fibrillation recurs off amiodarone, would consider atrial fibrillation ablation.  - Continue Toprol XL 100 bid.   - Continue Eliquis.  3. CKD: Stage III.  She has an atrophic left kidney.   - BMET today.  - Continue Farxiga 10 mg daily.   4. H/o VT: Has Medtronic ICD. 5. OSA: Uses CPAP.    Followup in 1 year if echo stable.    Erin Jarvis 11/20/2021

## 2021-11-22 NOTE — Progress Notes (Signed)
EPIC Encounter for ICM Monitoring  Patient Name: Erin Jarvis is a 56 y.o. female Date: 11/22/2021 Primary Care Physican: Patient, No Pcp Per Primary Cardiologist: Hochrein/[place] Electrophysiologist: Lovena Le Nephrologist: Dr Hollie Salk at [facility] 07/03/2021 Weight:  200 lbs           Transmission reviewed.    OptiVol Thoracic impedance suggesting normal fluid levels.   Prescribed: Torsemide 20 mg take 1 tablet as needed.   Labs: 06/06/2021 Creatinine 1.63, BUN 26, Potassium 4.1, Sodium 142, GFR 36 A complete set of results can be found in Results Review.   Recommendations:  Recommendations given at 8/23 HF clinic OV   Follow-up plan: ICM clinic phone appointment on 12/23/2021.   91 day device clinic remote transmission 12/06/2021.     EP/ Cardiology Next Visit:  Recall 8/22/204 with Dr Aundra Dubin.    Recall 08/01/2021 with Dr Lovena Le.   Copy of ICM check sent to Dr. Lovena Le.     3 month ICM trend: 11/20/2021.    12-14 Month ICM trend:     Erin Billings, RN 11/22/2021 4:22 PM

## 2021-12-06 ENCOUNTER — Ambulatory Visit (INDEPENDENT_AMBULATORY_CARE_PROVIDER_SITE_OTHER): Payer: Medicare Other

## 2021-12-06 DIAGNOSIS — I5022 Chronic systolic (congestive) heart failure: Secondary | ICD-10-CM | POA: Diagnosis not present

## 2021-12-06 LAB — CUP PACEART REMOTE DEVICE CHECK
Battery Remaining Longevity: 101 mo
Battery Voltage: 3.01 V
Brady Statistic RV Percent Paced: 0.36 %
Date Time Interrogation Session: 20230908033523
HighPow Impedance: 56 Ohm
Implantable Lead Implant Date: 20191203
Implantable Lead Location: 753860
Implantable Pulse Generator Implant Date: 20191203
Lead Channel Impedance Value: 285 Ohm
Lead Channel Impedance Value: 361 Ohm
Lead Channel Pacing Threshold Amplitude: 0.75 V
Lead Channel Pacing Threshold Pulse Width: 0.4 ms
Lead Channel Sensing Intrinsic Amplitude: 5.75 mV
Lead Channel Sensing Intrinsic Amplitude: 5.75 mV
Lead Channel Setting Pacing Amplitude: 2.5 V
Lead Channel Setting Pacing Pulse Width: 0.4 ms
Lead Channel Setting Sensing Sensitivity: 0.3 mV

## 2021-12-17 ENCOUNTER — Ambulatory Visit (HOSPITAL_COMMUNITY)
Admission: RE | Admit: 2021-12-17 | Discharge: 2021-12-17 | Disposition: A | Discharge status: Home / Self Care | Payer: Medicare Other | Source: Ambulatory Visit | Attending: Internal Medicine | Admitting: Internal Medicine

## 2021-12-17 DIAGNOSIS — I5022 Chronic systolic (congestive) heart failure: Secondary | ICD-10-CM | POA: Diagnosis present

## 2021-12-20 LAB — ECHOCARDIOGRAM COMPLETE
Area-P 1/2: 2.19 cm2
S' Lateral: 3.4 cm

## 2021-12-20 NOTE — Progress Notes (Signed)
Remote ICD transmission.   

## 2021-12-23 ENCOUNTER — Ambulatory Visit (INDEPENDENT_AMBULATORY_CARE_PROVIDER_SITE_OTHER): Payer: Medicare Other

## 2021-12-23 DIAGNOSIS — I5022 Chronic systolic (congestive) heart failure: Secondary | ICD-10-CM

## 2021-12-23 DIAGNOSIS — Z95810 Presence of automatic (implantable) cardiac defibrillator: Secondary | ICD-10-CM | POA: Diagnosis not present

## 2021-12-26 NOTE — Progress Notes (Signed)
EPIC Encounter for ICM Monitoring  Patient Name: Erin Jarvis is a 56 y.o. female Date: 12/26/2021 Primary Care Physican: Patient, No Pcp Per Primary Cardiologist: Hochrein/[place] Electrophysiologist: [place] Nephrologist: Dr Hollie Salk at [facility] 07/03/2021 Weight:  200 lbs           Transmission reviewed.    OptiVol Thoracic impedance suggesting normal fluid levels.   Prescribed: Torsemide 20 mg take 1 tablet as needed.   Labs: 06/06/2021 Creatinine 1.63, BUN 26, Potassium 4.1, Sodium 142, GFR 36 A complete set of results can be found in Results Review.   Recommendations: No changes.   Follow-up plan: ICM clinic phone appointment on 01/27/2022.   91 day device clinic remote transmission 03/07/2022.     EP/ Cardiology Next Visit:  Recall 11/20/2022 with Dr Aundra Dubin.    Recall 08/01/2021 with Dr Lovena Le.   Copy of ICM check sent to Dr. Myles Gip.      3 month ICM trend: 12/23/2021.    12-14 Month ICM trend:     [name], RN 12/26/2021 3:37 PM

## 2022-01-15 ENCOUNTER — Other Ambulatory Visit (HOSPITAL_COMMUNITY): Payer: Self-pay | Admitting: Cardiology

## 2022-01-27 ENCOUNTER — Ambulatory Visit (INDEPENDENT_AMBULATORY_CARE_PROVIDER_SITE_OTHER): Payer: Medicare Other

## 2022-01-27 DIAGNOSIS — Z95810 Presence of automatic (implantable) cardiac defibrillator: Secondary | ICD-10-CM | POA: Diagnosis not present

## 2022-01-27 DIAGNOSIS — I5022 Chronic systolic (congestive) heart failure: Secondary | ICD-10-CM

## 2022-01-29 NOTE — Progress Notes (Signed)
EPIC Encounter for ICM Monitoring  Patient Name: Erin Jarvis is a 56 y.o. female Date: 01/29/2022 Primary Care Physican: Patient, No Pcp Per Primary Cardiologist: Hochrein/[place] Electrophysiologist: [place] Nephrologist: Dr Hollie Salk at [facility] 07/03/2021 Weight:  200 lbs           Spoke with patient and heart failure questions reviewed.  Transmission results reviewed.  Pt asymptomatic for fluid accumulation.  Reports feeling well at this time and voices no complaints.     OptiVol Thoracic impedance normal but was suggesting intermittent days with possible fluid accumulation.   Prescribed: Torsemide 20 mg take 1 tablet as needed.   Labs: 11/20/2021 Creatinine 1.78, BUN 29, Potassium 4.2, Sodium 140, GFR 33 06/06/2021 Creatinine 1.63, BUN 26, Potassium 4.1, Sodium 142, GFR 36 A complete set of results can be found in Results Review.   Recommendations: No changes and encouraged to call if experiencing any fluid symptoms.   Follow-up plan: ICM clinic phone appointment on 03/03/2022.   91 day device clinic remote transmission 03/07/2022.     EP/ Cardiology Next Visit:  Recall 11/20/2022 with Dr Aundra Dubin.   Advised to call office for EP appointment.  Recall 08/01/2021 with Dr Lovena Le.   Copy of ICM check sent to Dr. Myles Gip.  3 month ICM trend: 01/27/2022.    12-14 Month ICM trend:     [name], RN 01/29/2022 7:15 AM

## 2022-02-12 ENCOUNTER — Telehealth (HOSPITAL_COMMUNITY): Payer: Self-pay | Admitting: Cardiology

## 2022-02-12 NOTE — Telephone Encounter (Signed)
Abnormal labs received from nephrology +UTI  Per Marlyce Huge, PA Labs stable except possible UTI, please call if frequent UTI;s will need to stop farxiga   Pt called, reports uti every 3 months since starting farxiga. Hesitant to discontinue farxiga for fear of kidneys worsening. Advised this would need to be reviewed by provider for further changes

## 2022-02-13 ENCOUNTER — Other Ambulatory Visit (HOSPITAL_COMMUNITY): Payer: Self-pay | Admitting: Cardiology

## 2022-02-13 NOTE — Telephone Encounter (Signed)
Patient advised and verbalized understanding. Med list updated to reflect changes.  ? ?

## 2022-02-13 NOTE — Telephone Encounter (Signed)
If she is getting frequent UTIs after starting the medication would be best to stop it

## 2022-02-14 ENCOUNTER — Other Ambulatory Visit (HOSPITAL_COMMUNITY): Payer: Self-pay | Admitting: Cardiology

## 2022-02-25 ENCOUNTER — Other Ambulatory Visit (HOSPITAL_COMMUNITY): Payer: Self-pay | Admitting: *Deleted

## 2022-02-25 MED ORDER — LEVOTHYROXINE SODIUM 25 MCG PO TABS: ORAL_TABLET | ORAL | 3 refills | Status: DC

## 2022-03-03 ENCOUNTER — Ambulatory Visit (INDEPENDENT_AMBULATORY_CARE_PROVIDER_SITE_OTHER): Payer: Medicare Other

## 2022-03-03 DIAGNOSIS — Z95810 Presence of automatic (implantable) cardiac defibrillator: Secondary | ICD-10-CM

## 2022-03-03 DIAGNOSIS — I5022 Chronic systolic (congestive) heart failure: Secondary | ICD-10-CM | POA: Diagnosis not present

## 2022-03-07 ENCOUNTER — Ambulatory Visit (INDEPENDENT_AMBULATORY_CARE_PROVIDER_SITE_OTHER): Payer: Medicare Other

## 2022-03-07 DIAGNOSIS — I429 Cardiomyopathy, unspecified: Secondary | ICD-10-CM

## 2022-03-07 LAB — CUP PACEART REMOTE DEVICE CHECK
Battery Remaining Longevity: 96 mo
Battery Voltage: 3 V
Brady Statistic RV Percent Paced: 0.32 %
Date Time Interrogation Session: 20231208033525
HighPow Impedance: 48 Ohm
Implantable Lead Connection Status: 753985
Implantable Lead Implant Date: 20191203
Implantable Lead Location: 753860
Implantable Pulse Generator Implant Date: 20191203
Lead Channel Impedance Value: 266 Ohm
Lead Channel Impedance Value: 342 Ohm
Lead Channel Pacing Threshold Amplitude: 0.875 V
Lead Channel Pacing Threshold Pulse Width: 0.4 ms
Lead Channel Sensing Intrinsic Amplitude: 5.625 mV
Lead Channel Sensing Intrinsic Amplitude: 5.625 mV
Lead Channel Setting Pacing Amplitude: 2.5 V
Lead Channel Setting Pacing Pulse Width: 0.4 ms
Lead Channel Setting Sensing Sensitivity: 0.3 mV
Zone Setting Status: 755011
Zone Setting Status: 755011

## 2022-03-07 NOTE — Progress Notes (Signed)
EPIC Encounter for ICM Monitoring  Patient Name: Erin Jarvis is a 56 y.o. female Date: 03/07/2022 Primary Care Physican: Patient, No Pcp Per Primary Cardiologist: Hochrein/[place] Electrophysiologist: [place] Nephrologist: Dr Hollie Salk at [facility] 07/03/2021 Weight:  200 lbs 03/07/2022 Weight: 200 lbs           Spoke with patient and heart failure questions reviewed.  Transmission results reviewed.  Pt asymptomatic for fluid accumulation.  Reports feeling well at this time and voices no complaints.     OptiVol Thoracic impedance suggesting possible fluid accumulation starting 11/13 but trending close to baseline.    Prescribed: Torsemide 20 mg take 1 tablet as needed.   Labs: 11/20/2021 Creatinine 1.78, BUN 29, Potassium 4.2, Sodium 140, GFR 33 06/06/2021 Creatinine 1.63, BUN 26, Potassium 4.1, Sodium 142, GFR 36 A complete set of results can be found in Results Review.   Recommendations:  Advised to take PRN Torsemide 1 tablet x 2 days and then return to PRN.    Follow-up plan: ICM clinic phone appointment on 04/14/2022.   91 day device clinic remote transmission 06/06/2022.     EP/ Cardiology Next Visit:  Recall 11/20/2022 with Dr Aundra Dubin.  Aware to call Dr Tanna Furry office for appointment.  Recall 08/01/2021 with Dr Lovena Le.   Copy of ICM check sent to Dr. Myles Gip.  3 month ICM trend: 03/07/2022.    12-14 Month ICM trend:     [name], RN 03/07/2022 1:55 PM

## 2022-03-28 NOTE — Progress Notes (Signed)
Remote ICD transmission.   

## 2022-04-14 ENCOUNTER — Ambulatory Visit (INDEPENDENT_AMBULATORY_CARE_PROVIDER_SITE_OTHER): Payer: Medicare Other

## 2022-04-14 DIAGNOSIS — Z95810 Presence of automatic (implantable) cardiac defibrillator: Secondary | ICD-10-CM

## 2022-04-14 DIAGNOSIS — I5022 Chronic systolic (congestive) heart failure: Secondary | ICD-10-CM

## 2022-04-17 ENCOUNTER — Telehealth: Payer: Self-pay

## 2022-04-17 NOTE — Telephone Encounter (Signed)
Remote ICM transmission received.  Attempted call to patient regarding ICM remote transmission and left detailed message per DPR.  Advised to return call for any fluid symptoms or questions. Next ICM remote transmission scheduled 05/20/2022.

## 2022-04-17 NOTE — Progress Notes (Signed)
EPIC Encounter for ICM Monitoring  Patient Name: Erin Jarvis is a 57 y.o. female Date: 04/17/2022 Primary Care Physican: Patient, No Pcp Per Primary Cardiologist: Hochrein/[facility] Electrophysiologist: [place] Nephrologist: Dr Hollie Salk at [facility] 07/03/2021 Weight:  200 lbs 03/07/2022 Weight: 200 lbs           Attempted call to patient and unable to reach.  Left detailed message per DPR regarding transmission. Transmission reviewed.     OptiVol Thoracic impedance suggesting normal fluid levels but was suggesting intermittent days with possible fluid accumulation within last month.    Prescribed: Torsemide 20 mg take 1 tablet as needed.   Labs: 11/20/2021 Creatinine 1.78, BUN 29, Potassium 4.2, Sodium 140, GFR 33 06/06/2021 Creatinine 1.63, BUN 26, Potassium 4.1, Sodium 142, GFR 36 A complete set of results can be found in Results Review.   Recommendations:  Left voice mail with ICM number and encouraged to call if experiencing any fluid symptoms.   Follow-up plan: ICM clinic phone appointment on 05/20/2022.   91 day device clinic remote transmission 06/06/2022.     EP/ Cardiology Next Visit:  Recall 11/20/2022 with Dr Aundra Dubin.  Aware to call Dr Tanna Furry office for appointment.  Recall 08/01/2021 with Dr Lovena Le.   Copy of ICM check sent to Dr. Myles Gip.  3 month ICM trend: 04/17/2022.    12-14 Month ICM trend:     [name], RN 04/17/2022 9:32 AM

## 2022-05-20 ENCOUNTER — Ambulatory Visit: Payer: Medicare Other

## 2022-05-20 DIAGNOSIS — I5022 Chronic systolic (congestive) heart failure: Secondary | ICD-10-CM | POA: Diagnosis not present

## 2022-05-20 DIAGNOSIS — Z95810 Presence of automatic (implantable) cardiac defibrillator: Secondary | ICD-10-CM

## 2022-05-23 NOTE — Progress Notes (Signed)
EPIC Encounter for ICM Monitoring  Patient Name: Erin Jarvis is a 57 y.o. female Date: 05/23/2022 Primary Care Physican: Patient, No Pcp Per Primary Cardiologist: Hochrein/[place] Electrophysiologist: [place] Nephrologist: Dr Hollie Salk at [facility] 07/03/2021 Weight:  200 lbs 03/07/2022 Weight: 200 lbs 05/23/2022 Weight: 200 lbs           Spoke with patient and heart failure questions reviewed.  Transmission results reviewed.  Pt asymptomatic for fluid accumulation.  Reports feeling well at this time and voices no complaints.  She reports adding fat back when making soup at home and understands it is high in salt.  Advised to look for alternatives for flavoring homemade soups    OptiVol Thoracic impedance suggesting possible fluid accumulation starting 2/15 and trending back toward baseline.    Prescribed: Torsemide 20 mg take 1 tablet as needed.   Labs: 11/20/2021 Creatinine 1.78, BUN 29, Potassium 4.2, Sodium 140, GFR 33 06/06/2021 Creatinine 1.63, BUN 26, Potassium 4.1, Sodium 142, GFR 36 A complete set of results can be found in Results Review.   Recommendations:  Advised to take 1 PRN Torsemide tomorrow.    Follow-up plan: ICM clinic phone appointment on 06/23/2022.   91 day device clinic remote transmission 06/06/2022.     EP/ Cardiology Next Visit:  Recall 11/20/2022 with Dr Aundra Dubin.  Advised to call office to schedule overdue EP appointment.  Recall 08/01/2021 with Dr Lovena Le.   Copy of ICM check sent to Dr. Myles Gip.  3 month ICM trend: 05/20/2022.    12-14 Month ICM trend:     [name], RN 05/23/2022 2:07 PM

## 2022-06-06 ENCOUNTER — Ambulatory Visit: Payer: Medicare Other

## 2022-06-06 DIAGNOSIS — I429 Cardiomyopathy, unspecified: Secondary | ICD-10-CM

## 2022-06-06 LAB — CUP PACEART REMOTE DEVICE CHECK
Battery Remaining Longevity: 91 mo
Battery Voltage: 3 V
Brady Statistic RV Percent Paced: 0.56 %
Date Time Interrogation Session: 20240308012402
HighPow Impedance: 51 Ohm
Implantable Lead Connection Status: 753985
Implantable Lead Implant Date: 20191203
Implantable Lead Location: 753860
Implantable Pulse Generator Implant Date: 20191203
Lead Channel Impedance Value: 285 Ohm
Lead Channel Impedance Value: 361 Ohm
Lead Channel Pacing Threshold Amplitude: 0.875 V
Lead Channel Pacing Threshold Pulse Width: 0.4 ms
Lead Channel Sensing Intrinsic Amplitude: 6.375 mV
Lead Channel Sensing Intrinsic Amplitude: 6.375 mV
Lead Channel Setting Pacing Amplitude: 2.5 V
Lead Channel Setting Pacing Pulse Width: 0.4 ms
Lead Channel Setting Sensing Sensitivity: 0.3 mV
Zone Setting Status: 755011
Zone Setting Status: 755011

## 2022-06-23 ENCOUNTER — Ambulatory Visit: Payer: Medicare Other | Attending: Cardiovascular Disease

## 2022-06-23 DIAGNOSIS — Z95810 Presence of automatic (implantable) cardiac defibrillator: Secondary | ICD-10-CM

## 2022-06-23 DIAGNOSIS — I5022 Chronic systolic (congestive) heart failure: Secondary | ICD-10-CM

## 2022-06-27 NOTE — Progress Notes (Signed)
EPIC Encounter for ICM Monitoring  Patient Name: Erin Jarvis is a 57 y.o. female Date: 06/27/2022 Primary Care Physican: Patient, No Pcp Per Primary Cardiologist: Hochrein/[place] Electrophysiologist: [place] Nephrologist: Dr Hollie Salk at [facility] 07/03/2021 Weight:  200 lbs 03/07/2022 Weight: 200 lbs 05/23/2022 Weight: 200 lbs           Spoke with patient and heart failure questions reviewed.  Transmission results reviewed.  Pt asymptomatic for fluid accumulation.  Reports feeling well at this time and voices no complaints.     OptiVol Thoracic impedance suggesting possible fluid accumulation starting 2/15 and trending back toward baseline.    Prescribed: Torsemide 20 mg take 1 tablet as needed.   Labs: 11/20/2021 Creatinine 1.78, BUN 29, Potassium 4.2, Sodium 140, GFR 33 06/06/2021 Creatinine 1.63, BUN 26, Potassium 4.1, Sodium 142, GFR 36 A complete set of results can be found in Results Review.   Recommendations:  No changes and encouraged to call if experiencing any fluid symptoms.   Follow-up plan: ICM clinic phone appointment on 07/28/2022.   91 day device clinic remote transmission 09/05/2022.     EP/ Cardiology Next Visit:  Recall 11/20/2022 with Dr Aundra Dubin.  Advised to call office to schedule overdue EP appointment.     Copy of ICM check sent to Dr. Myles Gip.  3 month ICM trend: 06/23/2022.    12-14 Month ICM trend:     [name], RN 06/27/2022 12:10 PM

## 2022-06-28 ENCOUNTER — Other Ambulatory Visit (HOSPITAL_COMMUNITY): Payer: Self-pay | Admitting: Cardiology

## 2022-07-07 ENCOUNTER — Other Ambulatory Visit (HOSPITAL_COMMUNITY): Payer: Self-pay | Admitting: Cardiology

## 2022-07-07 NOTE — Progress Notes (Signed)
Remote ICD transmission.   

## 2022-07-19 LAB — LAB REPORT - SCANNED
Albumin, Urine POC: 17.1
Creatinine, POC: 85.5 mg/dL
EGFR: 36
Microalb Creat Ratio: 20

## 2022-07-23 ENCOUNTER — Other Ambulatory Visit (HOSPITAL_COMMUNITY): Payer: Self-pay

## 2022-07-23 MED ORDER — APIXABAN 5 MG PO TABS: ORAL_TABLET | ORAL | 0 refills | Status: DC

## 2022-07-23 MED ORDER — APIXABAN 5 MG PO TABS: ORAL_TABLET | ORAL | 3 refills | Status: DC

## 2022-07-24 ENCOUNTER — Encounter: Payer: Self-pay | Admitting: Internal Medicine

## 2022-07-24 ENCOUNTER — Ambulatory Visit: Payer: Medicare Other | Attending: Internal Medicine | Admitting: Internal Medicine

## 2022-07-24 VITALS — BP 148/80 | HR 59 | Ht 67.0 in | Wt 197.4 lb

## 2022-07-24 DIAGNOSIS — Z95810 Presence of automatic (implantable) cardiac defibrillator: Secondary | ICD-10-CM | POA: Insufficient documentation

## 2022-07-24 DIAGNOSIS — I509 Heart failure, unspecified: Secondary | ICD-10-CM | POA: Insufficient documentation

## 2022-07-24 DIAGNOSIS — I4891 Unspecified atrial fibrillation: Secondary | ICD-10-CM | POA: Insufficient documentation

## 2022-07-24 NOTE — Patient Instructions (Signed)
Medication Instructions:  Your physician recommends that you continue on your current medications as directed. Please refer to the Current Medication list given to you today.  *If you need a refill on your cardiac medications before your next appointment, please call your pharmacy*  Lab Work: None ordered.  If you have labs (blood work) drawn today and your tests are completely normal, you will receive your results only by: MyChart Message (if you have MyChart) OR A paper copy in the mail If you have any lab test that is abnormal or we need to change your treatment, we will call you to review the results.  Testing/Procedures: None ordered.  Follow-Up: At [facility], you and your health needs are our priority.  As part of our continuing mission to provide you with exceptional heart care, we have created designated Provider Care Teams.  These Care Teams include your primary Cardiologist (physician) and Advanced Practice Providers (APPs -  Physician Assistants and Nurse Practitioners) who all work together to provide you with the care you need, when you need it.  We recommend signing up for the patient portal called "MyChart".  Sign up information is provided on this After Visit Summary.  MyChart is used to connect with patients for Virtual Visits (Telemedicine).  Patients are able to view lab/test results, encounter notes, upcoming appointments, etc.  Non-urgent messages can be sent to your provider as well.   To learn more about what you can do with MyChart, go to [email or web address].    Your next appointment:   1 year(s)  The format for your next appointment:   In Person  Provider:   Lewayne Bunting, MD{or one of the following Advanced Practice Providers on your designated Care Team:   Francis Dowse, [place] Casimiro Needle "Mardelle Matte" Lanna Poche, [place]  Remote monitoring is used to monitor your ICD from home. This monitoring reduces the number of office visits required to check your device to one  time per year. It allows [place] to keep an eye on the functioning of your device to ensure it is working properly. You are scheduled for a device check from home on 09/05/22. You may send your transmission at any time that day. If you have a wireless device, the transmission will be sent automatically. After your physician reviews your transmission, you will receive a postcard with your next transmission date.

## 2022-07-24 NOTE — Progress Notes (Signed)
HPI Ms. Vanwyk returns today for ongoing evaluation and management of her CHF, s/p ICD insertion. The patient notes that her EF has improved. She denies chest pain or sob. No syncope or ICD shock. She admits to being sedentary and has gained back some weight. Allergies  Allergen Reactions   Morphine And Related Nausea Only and Other (See Comments)    Feels sick,and like she is going to pass out; can't breathe, starts sweating   Atorvastatin     Nightmares     Current Outpatient Medications  Medication Sig Dispense Refill   acetaminophen (TYLENOL) 500 MG tablet Take 1,000 mg by mouth every 6 (six) hours as needed for mild pain or moderate pain.     apixaban (ELIQUIS) 5 MG TABS tablet TAKE 1 TABLET(5 MG) BY MOUTH TWICE DAILY 180 tablet 3   Calcium Citrate-Vitamin D 250-200 MG-UNIT TABS Take 1 tablet by mouth daily.     diphenhydramine-acetaminophen (TYLENOL PM) 25-500 MG TABS tablet Take 1 tablet by mouth at bedtime as needed (sleep).     ferrous sulfate 325 (65 FE) MG tablet Take 325 mg by mouth daily with breakfast.     levothyroxine (SYNTHROID) 25 MCG tablet TAKE 1 TABLET(25 MCG) BY MOUTH DAILY BEFORE BREAKFAST 90 tablet 0   Melatonin 1 MG CHEW Chew 1 tablet by mouth as needed.     metoprolol succinate (TOPROL-XL) 100 MG 24 hr tablet TAKE 1 TABLET(100 MG) BY MOUTH TWICE DAILY WITH OR IMMEDIATELY FOLLOWING A MEAL 180 tablet 3   Multiple Vitamin (MULTIVITAMIN) tablet Take 1 tablet by mouth daily.     sacubitril-valsartan (ENTRESTO) 24-26 MG Take 1 tablet by mouth 2 (two) times daily. 180 tablet 3   torsemide (DEMADEX) 20 MG tablet Take 1 tablet (20 mg total) by mouth as needed. 30 tablet 5   vitamin B-12 (CYANOCOBALAMIN) 1000 MCG tablet Take 1,000 mcg by mouth daily.     FARXIGA 10 MG TABS tablet Take 10 mg by mouth daily.     No current facility-administered medications for this visit.     Past Medical History:  Diagnosis Date   AICD (automatic cardioverter/defibrillator)  present 2002, 2010   secondary to post-partum cardiomyopathy (1996)   Arm vein blood clot, right    Arthritis    CHF (congestive heart failure)    Chronic kidney disease    only 1 kidney - left kidney is the size of a thumb   Depression    Diabetes mellitus without complication    no longer on meds since weight loss   Hypertension    Hypertensive cardiovascular disease    Hypothyroidism    Obesity    Persistent atrial fibrillation    Pneumonia    Postpartum cardiomyopathy    Last echocardiogram was last year and EF 45 -50 %   Seasonal allergies    Sleep apnea    complaint with CPAP   Ventricular tachycardia    s/p ICD Implantation (MDT Secura VR)    ROS:   All systems reviewed and negative except as noted in the HPI.   Past Surgical History:  Procedure Laterality Date   CARDIAC CATHETERIZATION N/A 04/11/2016   Procedure: Right/Left Heart Cath and Coronary Angiography;  Surgeon: Laurey Morale, MD;  Location: [facility] INVASIVE CV LAB;  Service: Cardiovascular;  Laterality: N/A;   CARDIAC DEFIBRILLATOR PLACEMENT  08/12/2000, 11/11/2008   Most recent Generator MDT Secura VR placed in Operating Room Services   CESAREAN SECTION  1996  CHOLECYSTECTOMY  2004   GASTRIC BYPASS  05/2017   ICD GENERATOR CHANGEOUT  03/02/2018   ICD GENERATOR CHANGEOUT N/A 03/02/2018   Procedure: ICD GENERATOR CHANGEOUT;  Surgeon: Marinus Maw, MD;  Location: [facility] INVASIVE CV LAB;  Service: Cardiovascular;  Laterality: N/A;   IR GENERIC HISTORICAL  06/13/2014   IR RADIOLOGIST EVAL & MGMT 06/13/2014 Berdine Dance, MD [facility] INTERV RAD   IR GENERIC HISTORICAL  04/09/2016   IR FLUORO GUIDE CV LINE LEFT 04/09/2016 Berdine Dance, MD [facility]   IR GENERIC HISTORICAL  04/09/2016   IR US GUIDE VASC ACCESS RIGHT 04/09/2016 Berdine Dance, MD [facility]   IR GENERIC HISTORICAL  04/09/2016   IR VENO/EXT/UNI RIGHT 04/09/2016 Berdine Dance, MD [facility]   IR GENERIC HISTORICAL  04/09/2016   IR US GUIDE VASC ACCESS LEFT  04/09/2016 Berdine Dance, MD [facility]   KNEE ARTHROSCOPY Right 11/30/2018   Procedure: right knee arthroscopy with debridement;  Surgeon: Cammy Copa, MD;  Location: [facility] OR;  Service: Orthopedics;  Laterality: Right;   LEAD REVISION/REPAIR N/A 03/02/2018   Procedure: LEAD REVISION/REPAIR;  Surgeon: Marinus Maw, MD;  Location: [facility] INVASIVE CV LAB;  Service: Cardiovascular;  Laterality: N/A;   TEE WITHOUT CARDIOVERSION N/A 04/08/2016   Procedure: TRANSESOPHAGEAL ECHOCARDIOGRAM (TEE);  Surgeon: Chrystie Nose, MD;  Location: [facility] ENDOSCOPY;  Service: Cardiovascular;  Laterality: N/A;     Family History  Problem Relation Age of Onset   Other Mother        has chronic pain syndrome from DDD with spinal cord stimulator   Hypertension Mother    COPD Father    Arthritis/Rheumatoid Father      Social History   Socioeconomic History   Marital status: Divorced    Spouse name: Not on file   Number of children: Not on file   Years of education: Not on file   Highest education level: Not on file  Occupational History   Not on file  Tobacco Use   Smoking status: Never   Smokeless tobacco: Never  Vaping Use   Vaping Use: Never used  Substance and Sexual Activity   Alcohol use: No    Alcohol/week: 0.0 standard drinks of alcohol   Drug use: No   Sexual activity: Not on file  Other Topics Concern   Not on file  Social History Narrative   Lives with her parents and 23 year old daughter in [place].   Social Determinants of Health   Financial Resource Strain: Not on file  Food Insecurity: Not on file  Transportation Needs: Not on file  Physical Activity: Not on file  Stress: Not on file  Social Connections: Not on file  Intimate Partner Violence: Not on file     BP (!) 148/80   Pulse (!) 59   Ht  (1.702 m)   Wt 197 lb 6.4 oz (89.5 kg)   LMP 05/30/2013 (Approximate)   SpO2 99%   BMI 30.92 kg/m   Physical Exam:  obese appearing NAD HEENT:  Unremarkable Neck:  No JVD, no thyromegally Lymphatics:  No adenopathy Back:  No CVA tenderness Lungs:  Clear with no wheezes HEART:  Regular rate rhythm, no murmurs, no rubs, no clicks Abd:  soft, positive bowel sounds, no organomegally, no rebound, no guarding Ext:  2 plus pulses, no edema, no cyanosis, no clubbing Skin:  No rashes no nodules Neuro:  CN II through XII intact, motor grossly intact  EKG - sinus bradycardia  DEVICE  Normal device function.  See PaceArt for details.   Assess/Plan:  1.Chronic systolic heart failure - her symptoms are class 2. She will continue her current medical therapy. Her EF is improved. 2. ICD - her medtronic ICD is working normally and she has almost 7.5 years of battery longevity. 3. Obesity - she is encouraged to lose weight.  4. Atrial fib - she is maintaining NSR very nicely. She will continue eliquis.   Sharlot Gowda Issaih Kaus,MD

## 2022-07-28 ENCOUNTER — Ambulatory Visit: Payer: Medicare Other | Attending: Cardiovascular Disease

## 2022-07-28 DIAGNOSIS — I509 Heart failure, unspecified: Secondary | ICD-10-CM | POA: Diagnosis not present

## 2022-07-28 DIAGNOSIS — Z95810 Presence of automatic (implantable) cardiac defibrillator: Secondary | ICD-10-CM | POA: Diagnosis not present

## 2022-08-01 ENCOUNTER — Encounter: Payer: Self-pay | Admitting: Nephrology

## 2022-08-01 NOTE — Progress Notes (Signed)
EPIC Encounter for ICM Monitoring  Patient Name: Erin Jarvis is a 57 y.o. female Date: 08/01/2022 Primary Care Physican: Patient, No Pcp Per Primary Cardiologist: Hochrein/[facility] Electrophysiologist: Ladona Ridgel Nephrologist: Dr Signe Colt at [facility] 07/03/2021 Weight:  200 lbs 03/07/2022 Weight: 200 lbs 05/23/2022 Weight: 200 lbs           Transmission reviewed.       OptiVol Thoracic impedance suggesting normal fluid levels within the last month.    Prescribed: Torsemide 20 mg take 1 tablet as needed.   Labs: 07/18/2022 Creatinine 1.63, BUN 42, Potassium 3.8, Sodium 138, GFR 36 A complete set of results can be found in Results Review.   Recommendations:  No changes.   Follow-up plan: ICM clinic phone appointment on 09/01/2022.   91 day device clinic remote transmission 09/05/2022.     EP/ Cardiology Next Visit:  Recall 11/20/2022 with Dr Shirlee Latch.   Last EP visit 07/24/2022 with Dr Ladona Ridgel (no recall for 2025).   Copy of ICM check sent to Dr. Ladona Ridgel.  3 month ICM trend: 07/28/2022.    12-14 Month ICM trend:     Karie Soda, RN 08/01/2022 1:51 PM

## 2022-09-01 ENCOUNTER — Ambulatory Visit: Payer: Medicare Other | Attending: Internal Medicine

## 2022-09-01 DIAGNOSIS — Z95810 Presence of automatic (implantable) cardiac defibrillator: Secondary | ICD-10-CM

## 2022-09-01 DIAGNOSIS — I509 Heart failure, unspecified: Secondary | ICD-10-CM

## 2022-09-03 ENCOUNTER — Telehealth: Payer: Self-pay

## 2022-09-03 NOTE — Progress Notes (Signed)
EPIC Encounter for ICM Monitoring  Patient Name: Erin Jarvis is a 57 y.o. female Date: 09/03/2022 Primary Care Physican: Patient, No Pcp Per Primary Cardiologist: Hochrein/[facility] Electrophysiologist: Ladona Ridgel Nephrologist: Dr Signe Colt at [facility] 07/03/2021 Weight:  200 lbs 03/07/2022 Weight: 200 lbs 05/23/2022 Weight: 200 lbs           Attempted call to patient and unable to reach.  Left detailed message per DPR regarding transmission. Transmission reviewed.       OptiVol Thoracic impedance suggesting normal fluid levels within the last month.    Prescribed: Torsemide 20 mg take 1 tablet as needed.   Labs: 07/18/2022 Creatinine 1.63, BUN 42, Potassium 3.8, Sodium 138, GFR 36 A complete set of results can be found in Results Review.   Recommendations:  Left voice mail with ICM number and encouraged to call if experiencing any fluid symptoms.   Follow-up plan: ICM clinic phone appointment on 10/06/2022.   91 day device clinic remote transmission 12/05/2022.     EP/ Cardiology Next Visit:  Recall 11/20/2022 with Dr Shirlee Latch.   Last EP visit 07/24/2022 with Dr Ladona Ridgel (no recall for 2025).   Copy of ICM check sent to Dr. Ladona Ridgel.   3 month ICM trend: 09/01/2022.    12-14 Month ICM trend:     Karie Soda, RN 09/03/2022 3:55 PM

## 2022-09-03 NOTE — Telephone Encounter (Signed)
Remote ICM transmission received.  Attempted call to patient regarding ICM remote transmission and left detailed message per DPR.  Left ICM phone number and advised to return call for any fluid symptoms or questions. Next ICM remote transmission scheduled 10/06/2022.

## 2022-09-05 ENCOUNTER — Ambulatory Visit (INDEPENDENT_AMBULATORY_CARE_PROVIDER_SITE_OTHER): Payer: Medicare Other

## 2022-09-05 DIAGNOSIS — I429 Cardiomyopathy, unspecified: Secondary | ICD-10-CM

## 2022-09-05 LAB — CUP PACEART REMOTE DEVICE CHECK
Battery Remaining Longevity: 87 mo
Battery Voltage: 2.99 V
Brady Statistic RV Percent Paced: 0.56 %
Date Time Interrogation Session: 20240607012204
HighPow Impedance: 59 Ohm
Implantable Lead Connection Status: 753985
Implantable Lead Implant Date: 20191203
Implantable Lead Location: 753860
Implantable Pulse Generator Implant Date: 20191203
Lead Channel Impedance Value: 266 Ohm
Lead Channel Impedance Value: 342 Ohm
Lead Channel Pacing Threshold Amplitude: 0.875 V
Lead Channel Pacing Threshold Pulse Width: 0.4 ms
Lead Channel Sensing Intrinsic Amplitude: 7.25 mV
Lead Channel Sensing Intrinsic Amplitude: 7.25 mV
Lead Channel Setting Pacing Amplitude: 2.5 V
Lead Channel Setting Pacing Pulse Width: 0.4 ms
Lead Channel Setting Sensing Sensitivity: 0.3 mV
Zone Setting Status: 755011
Zone Setting Status: 755011

## 2022-09-23 NOTE — Progress Notes (Signed)
Remote ICD transmission.   

## 2022-10-04 ENCOUNTER — Other Ambulatory Visit (HOSPITAL_COMMUNITY): Payer: Self-pay | Admitting: Cardiology

## 2022-10-06 ENCOUNTER — Ambulatory Visit: Payer: Medicare Other | Attending: Internal Medicine

## 2022-10-06 DIAGNOSIS — I509 Heart failure, unspecified: Secondary | ICD-10-CM

## 2022-10-06 DIAGNOSIS — Z95810 Presence of automatic (implantable) cardiac defibrillator: Secondary | ICD-10-CM | POA: Diagnosis not present

## 2022-10-06 MED ORDER — TORSEMIDE 20 MG PO TABS: 20.0000 mg | ORAL_TABLET | ORAL | 5 refills | Status: DC | PRN

## 2022-10-06 NOTE — Progress Notes (Signed)
EPIC Encounter for ICM Monitoring  Patient Name: Erin Jarvis is a 57 y.o. female Date: 10/06/2022 Primary Care Physican: Patient, No Pcp Per Primary Cardiologist: Hochrein/McLean Electrophysiologist: Ladona Ridgel Nephrologist: Dr Signe Colt at [facility] 07/03/2021 Weight:  200 lbs 03/07/2022 Weight: 200 lbs 05/23/2022 Weight: 200 lbs 10/06/2022 Weight: 197 lbs           Spoke with patient and heart failure questions reviewed.  Transmission results reviewed.  Pt asymptomatic for fluid accumulation.  Reports feeling well at this time and voices no complaints.     OptiVol Thoracic impedance suggesting possible fluid accumulation starting 7/1.    Prescribed: Torsemide 20 mg take 1 tablet as needed.   Labs: 07/18/2022 Creatinine 1.63, BUN 42, Potassium 3.8, Sodium 138, GFR 36   A complete set of results can be found in Results Review.   Recommendations:  Advised to take 1 PRN Torsemide tablet x 1 day.  Pt [facility] [place] on [address] and script sent as requested.   Follow-up plan: ICM clinic phone appointment on 10/13/2022 to recheck fluid levels.   91 day device clinic remote transmission 12/05/2022.     EP/ Cardiology Next Visit:  Advised to call Dr Alford Highland office to make August OV.  Recall 11/20/2022 with Dr Shirlee Latch.   Last EP visit 07/24/2022 with Dr Ladona Ridgel (no recall for 2025).   Copy of ICM check sent to Dr. Ladona Ridgel.   3 month ICM trend: 10/06/2022.    12-14 Month ICM trend:     Karie Soda, RN 10/06/2022 12:30 PM

## 2022-10-13 ENCOUNTER — Ambulatory Visit: Payer: Medicare Other | Attending: Internal Medicine

## 2022-10-13 DIAGNOSIS — I509 Heart failure, unspecified: Secondary | ICD-10-CM

## 2022-10-13 DIAGNOSIS — Z95810 Presence of automatic (implantable) cardiac defibrillator: Secondary | ICD-10-CM

## 2022-10-19 NOTE — Progress Notes (Signed)
EPIC Encounter for ICM Monitoring  Patient Name: Erin Jarvis is a 57 y.o. female Date: 10/19/2022 Primary Care Physican: Patient, No Pcp Per Primary Cardiologist: Hochrein/McLean Electrophysiologist: Ladona Ridgel Nephrologist: Dr Signe Colt at [facility] 07/03/2021 Weight:  200 lbs 03/07/2022 Weight: 200 lbs 05/23/2022 Weight: 200 lbs 10/06/2022 Weight: 197 lbs           Transmission results reviewed.     OptiVol Thoracic impedance suggesting fluid levels returned to normal after taking PRN Furosemide.    Prescribed: Torsemide 20 mg take 1 tablet as needed.   Labs: 07/18/2022 Creatinine 1.63, BUN 42, Potassium 3.8, Sodium 138, GFR 36   A complete set of results can be found in Results Review.   Recommendations:  No changes.    Follow-up plan: ICM clinic phone appointment on 11/10/2022.   91 day device clinic remote transmission 12/05/2022.     EP/ Cardiology Next Visit:   Recall 11/20/2022 with Dr Shirlee Latch.   Last EP visit 07/24/2022 with Dr Ladona Ridgel (no recall for 2025).   Copy of ICM check sent to Dr. Ladona Ridgel.  3 month ICM trend: 10/14/2022.    12-14 Month ICM trend:     Karie Soda, RN 10/19/2022 11:58 AM

## 2022-11-10 ENCOUNTER — Ambulatory Visit: Payer: Medicare Other | Attending: Internal Medicine

## 2022-11-10 DIAGNOSIS — I509 Heart failure, unspecified: Secondary | ICD-10-CM

## 2022-11-10 DIAGNOSIS — Z95810 Presence of automatic (implantable) cardiac defibrillator: Secondary | ICD-10-CM

## 2022-11-14 ENCOUNTER — Telehealth: Payer: Self-pay

## 2022-11-14 ENCOUNTER — Other Ambulatory Visit (HOSPITAL_COMMUNITY): Payer: Self-pay

## 2022-11-14 MED ORDER — ENTRESTO 24-26 MG PO TABS: 1.0000 | ORAL_TABLET | Freq: Two times a day (BID) | ORAL | 0 refills | Status: DC

## 2022-11-14 NOTE — Telephone Encounter (Signed)
Remote ICM transmission received.  Attempted call to patient regarding ICM remote transmission and left detailed message per DPRl.  Left ICM phone number and advised to return call for any fluid symptoms or questions. Next ICM remote transmission scheduled 12/15/2022.

## 2022-11-14 NOTE — Progress Notes (Signed)
EPIC Encounter for ICM Monitoring  Patient Name: ANILU COUNCILMAN is a 57 y.o. female Date: 11/14/2022 Primary Care Physican: Patient, No Pcp Per Primary Cardiologist: Hochrein/McLean Electrophysiologist: Ladona Ridgel Nephrologist: Dr Signe Colt at [facility] 07/03/2021 Weight:  200 lbs 03/07/2022 Weight: 200 lbs 05/23/2022 Weight: 200 lbs 10/06/2022 Weight: 197 lbs           Attempted call to patient and unable to reach.  Left detailed message per DPR regarding transmission. Transmission reviewed.    OptiVol Thoracic impedance suggesting normal fluid levels within the last month.    Prescribed: Torsemide 20 mg take 1 tablet as needed.   Labs: 07/18/2022 Creatinine 1.63, BUN 42, Potassium 3.8, Sodium 138, GFR 36   A complete set of results can be found in Results Review.   Recommendations:  Left voice mail with ICM number and encouraged to call if experiencing any fluid symptoms.   Follow-up plan: ICM clinic phone appointment on 12/15/2022.   91 day device clinic remote transmission 12/05/2022.     EP/ Cardiology Next Visit:   Recall 11/20/2022 with Dr Shirlee Latch.   Last EP visit 07/24/2022 with Dr Ladona Ridgel (no recall for 2025).   Copy of ICM check sent to Dr. Ladona Ridgel.  3 month ICM trend: 11/10/2022.    12-14 Month ICM trend:     Karie Soda, RN 11/14/2022 1:30 PM

## 2022-12-05 ENCOUNTER — Ambulatory Visit (INDEPENDENT_AMBULATORY_CARE_PROVIDER_SITE_OTHER): Payer: Medicare Other

## 2022-12-05 DIAGNOSIS — I429 Cardiomyopathy, unspecified: Secondary | ICD-10-CM | POA: Diagnosis not present

## 2022-12-05 LAB — CUP PACEART REMOTE DEVICE CHECK
Battery Remaining Longevity: 87 mo
Battery Voltage: 3 V
Brady Statistic RV Percent Paced: 1.56 %
Date Time Interrogation Session: 20240906022503
HighPow Impedance: 57 Ohm
Implantable Lead Connection Status: 753985
Implantable Lead Implant Date: 20191203
Implantable Lead Location: 753860
Implantable Pulse Generator Implant Date: 20191203
Lead Channel Impedance Value: 228 Ohm
Lead Channel Impedance Value: 304 Ohm
Lead Channel Pacing Threshold Amplitude: 1 V
Lead Channel Pacing Threshold Pulse Width: 0.4 ms
Lead Channel Sensing Intrinsic Amplitude: 6.75 mV
Lead Channel Sensing Intrinsic Amplitude: 6.75 mV
Lead Channel Setting Pacing Amplitude: 2.5 V
Lead Channel Setting Pacing Pulse Width: 0.4 ms
Lead Channel Setting Sensing Sensitivity: 0.3 mV
Zone Setting Status: 755011
Zone Setting Status: 755011

## 2022-12-09 NOTE — Progress Notes (Signed)
Remote ICD transmission.   

## 2022-12-15 ENCOUNTER — Ambulatory Visit: Payer: Medicare Other | Attending: Internal Medicine

## 2022-12-15 DIAGNOSIS — Z95810 Presence of automatic (implantable) cardiac defibrillator: Secondary | ICD-10-CM | POA: Diagnosis not present

## 2022-12-15 DIAGNOSIS — I509 Heart failure, unspecified: Secondary | ICD-10-CM

## 2022-12-17 ENCOUNTER — Other Ambulatory Visit (HOSPITAL_COMMUNITY): Payer: Self-pay | Admitting: Cardiology

## 2022-12-18 NOTE — Progress Notes (Signed)
EPIC Encounter for ICM Monitoring  Patient Name: Erin Jarvis is a 57 y.o. female Date: 12/18/2022 Primary Care Physican: Patient, No Pcp Per Primary Cardiologist: Hochrein/McLean Electrophysiologist: Ladona Ridgel Nephrologist: Dr Signe Colt at [facility] 07/03/2021 Weight:  200 lbs 03/07/2022 Weight: 200 lbs 05/23/2022 Weight: 200 lbs 10/06/2022 Weight: 197 lbs           Transmission reviewed.    OptiVol Thoracic impedance suggesting normal fluid levels within the last month.    Prescribed: Torsemide 20 mg take 1 tablet as needed.   Labs: 07/18/2022 Creatinine 1.63, BUN 42, Potassium 3.8, Sodium 138, GFR 36   A complete set of results can be found in Results Review.   Recommendations: No changes.     Follow-up plan: ICM clinic phone appointment on 01/19/2023.   91 day device clinic remote transmission 03/06/2023.     EP/ Cardiology Next Visit:   Recall 11/20/2022 with Dr Shirlee Latch.   Last EP visit 07/24/2022 with Dr Ladona Ridgel (no recall for 2025).   Copy of ICM check sent to Dr. Ladona Ridgel.  3 month ICM trend: 12/15/2022.    12-14 Month ICM trend:     Karie Soda, RN 12/18/2022 10:53 AM

## 2022-12-31 ENCOUNTER — Other Ambulatory Visit (HOSPITAL_COMMUNITY): Payer: Self-pay | Admitting: Cardiology

## 2023-01-19 ENCOUNTER — Ambulatory Visit: Payer: Medicare Other | Attending: Internal Medicine

## 2023-01-19 DIAGNOSIS — I509 Heart failure, unspecified: Secondary | ICD-10-CM | POA: Diagnosis not present

## 2023-01-19 DIAGNOSIS — Z95810 Presence of automatic (implantable) cardiac defibrillator: Secondary | ICD-10-CM | POA: Diagnosis not present

## 2023-01-19 NOTE — Progress Notes (Signed)
EPIC Encounter for ICM Monitoring  Patient Name: Erin Jarvis is a 57 y.o. female Date: 01/19/2023 Primary Care Physican: Patient, No Pcp Per Primary Cardiologist: Hochrein/McLean Electrophysiologist: Ladona Ridgel Nephrologist: Dr Signe Colt at [facility] 07/03/2021 Weight:  200 lbs 03/07/2022 Weight: 200 lbs 05/23/2022 Weight: 200 lbs 10/06/2022 Weight: 197 lbs           Spoke with patient and heart failure questions reviewed.  Transmission results reviewed.  Pt asymptomatic for fluid accumulation.  Reports feeling well at this time and voices no complaints.     OptiVol Thoracic impedance suggesting possible fluid accumulation starting 10/12.    Prescribed: Torsemide 20 mg take 1 tablet as needed.   Labs: 07/18/2022 Creatinine 1.63, BUN 42, Potassium 3.8, Sodium 138, GFR 36   A complete set of results can be found in Results Review.   Recommendations:  Advised to take PRN Torsemide 20 mg x 1 day   Follow-up plan: ICM clinic phone appointment on 02/02/2023 (manual to recheck fluid levels).   91 day device clinic remote transmission 03/06/2023.     EP/ Cardiology Next Visit:  03/17/2023 with Dr Shirlee Latch.   Recall 07/02/2023 with Dr Ladona Ridgel.   Copy of ICM check sent to Dr. Ladona Ridgel  3 month ICM trend: 01/19/2023.    12-14 Month ICM trend:     Karie Soda, RN 01/19/2023 2:03 PM

## 2023-02-04 NOTE — Progress Notes (Signed)
No ICM remote transmission received for 02/02/2023 and next ICM transmission scheduled for 02/23/2023.

## 2023-02-13 ENCOUNTER — Other Ambulatory Visit (HOSPITAL_COMMUNITY): Payer: Self-pay | Admitting: Cardiology

## 2023-02-17 ENCOUNTER — Other Ambulatory Visit (HOSPITAL_COMMUNITY): Payer: Self-pay

## 2023-02-17 DIAGNOSIS — I5022 Chronic systolic (congestive) heart failure: Secondary | ICD-10-CM

## 2023-02-17 MED ORDER — METOPROLOL SUCCINATE ER 100 MG PO TB24
ORAL_TABLET | ORAL | 1 refills | Status: DC
Start: 2023-02-17 — End: 2023-08-13

## 2023-02-23 ENCOUNTER — Ambulatory Visit: Payer: Medicare Other | Attending: Internal Medicine

## 2023-02-23 DIAGNOSIS — Z95810 Presence of automatic (implantable) cardiac defibrillator: Secondary | ICD-10-CM

## 2023-02-23 DIAGNOSIS — I509 Heart failure, unspecified: Secondary | ICD-10-CM

## 2023-02-24 MED ORDER — TORSEMIDE 20 MG PO TABS: 20.0000 mg | ORAL_TABLET | ORAL | 5 refills | Status: AC | PRN

## 2023-02-24 NOTE — Progress Notes (Signed)
EPIC Encounter for ICM Monitoring  Patient Name: Erin Jarvis is a 57 y.o. female Date: 02/24/2023 Primary Care Physican: Patient, No Pcp Per Primary Cardiologist: Hochrein/McLean Electrophysiologist: Ladona Ridgel Nephrologist: Dr Signe Colt at [facility] 07/03/2021 Weight:  200 lbs 03/07/2022 Weight: 200 lbs 05/23/2022 Weight: 200 lbs 10/06/2022 Weight: 197 lbs  VT-NS (>4 beats, >182 bpm)   2 Time in AF  <0.1 hr/day (<0.1%)           Spoke with patient and heart failure questions reviewed.  Transmission results reviewed.  Pt asymptomatic for fluid accumulation.  Reports feeling well at this time and voices no complaints.  Kidney physician discontinued Marcelline Deist for recurring UTI.     OptiVol Thoracic impedance suggesting possible fluid accumulation starting 10/29 but trending close to baseline.    Prescribed: Torsemide 20 mg take 1 tablet as needed.   Labs: 07/18/2022 Creatinine 1.63, BUN 42, Potassium 3.8, Sodium 138, GFR 36   A complete set of results can be found in Results Review.   Recommendations:  [facility]   No changes and encouraged to call if experiencing any fluid symptoms.   Follow-up plan: ICM clinic phone appointment on 03/30/2023.   91 day device clinic remote transmission 03/06/2023.     EP/ Cardiology Next Visit:  03/17/2023 with Dr Shirlee Latch.   Recall 07/02/2023 with Dr Ladona Ridgel.   Copy of ICM check sent to Dr. Ladona Ridgel  3 month ICM trend: 02/23/2023.    12-14 Month ICM trend:     Karie Soda, RN 02/24/2023 8:43 AM

## 2023-03-06 ENCOUNTER — Ambulatory Visit (INDEPENDENT_AMBULATORY_CARE_PROVIDER_SITE_OTHER): Payer: Medicare Other

## 2023-03-06 DIAGNOSIS — I429 Cardiomyopathy, unspecified: Secondary | ICD-10-CM

## 2023-03-06 LAB — CUP PACEART REMOTE DEVICE CHECK
Battery Remaining Longevity: 82 mo
Battery Voltage: 2.99 V
Brady Statistic RV Percent Paced: 0.76 %
Date Time Interrogation Session: 20241206012204
HighPow Impedance: 54 Ohm
Implantable Lead Connection Status: 753985
Implantable Lead Implant Date: 20191203
Implantable Lead Location: 753860
Implantable Pulse Generator Implant Date: 20191203
Lead Channel Impedance Value: 266 Ohm
Lead Channel Impedance Value: 361 Ohm
Lead Channel Pacing Threshold Amplitude: 1 V
Lead Channel Pacing Threshold Pulse Width: 0.4 ms
Lead Channel Sensing Intrinsic Amplitude: 6.625 mV
Lead Channel Sensing Intrinsic Amplitude: 6.625 mV
Lead Channel Setting Pacing Amplitude: 2.5 V
Lead Channel Setting Pacing Pulse Width: 0.4 ms
Lead Channel Setting Sensing Sensitivity: 0.3 mV
Zone Setting Status: 755011
Zone Setting Status: 755011

## 2023-03-17 ENCOUNTER — Other Ambulatory Visit (HOSPITAL_COMMUNITY): Payer: Self-pay

## 2023-03-17 ENCOUNTER — Ambulatory Visit (HOSPITAL_COMMUNITY)
Admission: RE | Admit: 2023-03-17 | Discharge: 2023-03-17 | Disposition: A | Discharge status: Home / Self Care | Payer: Medicare Other | Source: Ambulatory Visit | Attending: Cardiology | Admitting: Cardiology

## 2023-03-17 ENCOUNTER — Encounter (HOSPITAL_COMMUNITY): Payer: Self-pay | Admitting: Cardiology

## 2023-03-17 VITALS — BP 120/70 | HR 62 | Wt 203.0 lb

## 2023-03-17 DIAGNOSIS — E119 Type 2 diabetes mellitus without complications: Secondary | ICD-10-CM | POA: Insufficient documentation

## 2023-03-17 DIAGNOSIS — N183 Chronic kidney disease, stage 3 unspecified: Secondary | ICD-10-CM | POA: Diagnosis not present

## 2023-03-17 DIAGNOSIS — Z79899 Other long term (current) drug therapy: Secondary | ICD-10-CM | POA: Diagnosis not present

## 2023-03-17 DIAGNOSIS — Z87440 Personal history of urinary (tract) infections: Secondary | ICD-10-CM | POA: Diagnosis not present

## 2023-03-17 DIAGNOSIS — I428 Other cardiomyopathies: Secondary | ICD-10-CM | POA: Diagnosis not present

## 2023-03-17 DIAGNOSIS — Z95810 Presence of automatic (implantable) cardiac defibrillator: Secondary | ICD-10-CM | POA: Diagnosis not present

## 2023-03-17 DIAGNOSIS — I5022 Chronic systolic (congestive) heart failure: Secondary | ICD-10-CM | POA: Insufficient documentation

## 2023-03-17 DIAGNOSIS — I4819 Other persistent atrial fibrillation: Secondary | ICD-10-CM | POA: Insufficient documentation

## 2023-03-17 DIAGNOSIS — E1122 Type 2 diabetes mellitus with diabetic chronic kidney disease: Secondary | ICD-10-CM | POA: Insufficient documentation

## 2023-03-17 DIAGNOSIS — Z7901 Long term (current) use of anticoagulants: Secondary | ICD-10-CM | POA: Diagnosis not present

## 2023-03-17 DIAGNOSIS — G4733 Obstructive sleep apnea (adult) (pediatric): Secondary | ICD-10-CM | POA: Diagnosis not present

## 2023-03-17 DIAGNOSIS — I130 Hypertensive heart and chronic kidney disease with heart failure and stage 1 through stage 4 chronic kidney disease, or unspecified chronic kidney disease: Secondary | ICD-10-CM | POA: Diagnosis not present

## 2023-03-17 DIAGNOSIS — R5383 Other fatigue: Secondary | ICD-10-CM | POA: Diagnosis not present

## 2023-03-17 LAB — BASIC METABOLIC PANEL
Anion gap: 11 (ref 5–15)
BUN: 21 mg/dL — ABNORMAL HIGH (ref 6–20)
CO2: 18 mmol/L — ABNORMAL LOW (ref 22–32)
Calcium: 8.7 mg/dL — ABNORMAL LOW (ref 8.9–10.3)
Chloride: 110 mmol/L (ref 98–111)
Creatinine, Ser: 1.39 mg/dL — ABNORMAL HIGH (ref 0.44–1.00)
GFR, Estimated: 44 mL/min — ABNORMAL LOW (ref >60)
Glucose, Bld: 91 mg/dL (ref 70–99)
Potassium: 4 mmol/L (ref 3.5–5.1)
Sodium: 139 mmol/L (ref 135–145)

## 2023-03-17 LAB — BRAIN NATRIURETIC PEPTIDE: B Natriuretic Peptide: 301 pg/mL — ABNORMAL HIGH (ref 0.0–100.0)

## 2023-03-17 LAB — HEMOGLOBIN A1C
Hgb A1c MFr Bld: 5.3 % (ref 4.8–5.6)
Mean Plasma Glucose: 105.41 mg/dL

## 2023-03-17 NOTE — Patient Instructions (Addendum)
We will call you about the [place].  Labs done today, your results will be available in MyChart, we will contact you for abnormal readings.   Repeat blood work in 10 days   Your physician has requested that you have an echocardiogram. Echocardiography is a painless test that uses sound waves to create images of your heart. It provides your doctor with information about the size and shape of your heart and how well your heart's chambers and valves are working. This procedure takes approximately one hour. There are no restrictions for this procedure. Please do NOT wear cologne, perfume, aftershave, or lotions (deodorant is allowed). Please arrive 15 minutes prior to your appointment time.  Please note: We ask at that you not bring children with you during ultrasound (echo/ vascular) testing. Due to room size and safety concerns, children are not allowed in the ultrasound rooms during exams. Our front office staff cannot provide observation of children in our lobby area while testing is being conducted. An adult accompanying a patient to their appointment will only be allowed in the ultrasound room at the discretion of the ultrasound technician under special circumstances. We apologize for any inconvenience.  Your physician recommends that you schedule a follow-up appointment in: 6 months  If you have any questions or concerns before your next appointment please send [place] a message through [place] or call our office at [phone].    TO LEAVE A MESSAGE FOR THE NURSE SELECT OPTION 2, PLEASE LEAVE A MESSAGE INCLUDING: YOUR NAME DATE OF BIRTH CALL BACK NUMBER REASON FOR CALL**this is important as we prioritize the call backs  YOU WILL RECEIVE A CALL BACK THE SAME DAY AS LONG AS YOU CALL BEFORE 4:00 PM  At the [facility], you and your health needs are our priority. As part of our continuing mission to provide you with exceptional heart care, we have created designated Provider  Care Teams. These Care Teams include your primary Cardiologist (physician) and Advanced Practice Providers (APPs- Physician Assistants and Nurse Practitioners) who all work together to provide you with the care you need, when you need it.   You may see any of the following providers on your designated Care Team at your next follow up: Dr Arvilla Meres Dr Marca Ancona Dr. Dorthula Nettles Dr. Clearnce Hasten Amy Filbert Schilder, NP Robbie Lis, [place] [facility] [place], [place] Brynda Peon, NP [place] Lee, NP Karle Plumber, PharmD   Please be sure to bring in all your medications bottles to every appointment.    Thank you for choosing [facility]

## 2023-03-18 LAB — MICROALBUMIN / CREATININE URINE RATIO
Creatinine, Urine: 153.7 mg/dL
Microalb Creat Ratio: 35 mg/g{creat} — ABNORMAL HIGH (ref 0–29)
Microalb, Ur: 54.1 ug/mL — ABNORMAL HIGH

## 2023-03-18 NOTE — Progress Notes (Signed)
PCP: Dr. Olena Leatherwood HF Cardiology: Dr. Shirlee Latch  [name] is a 57 y.o. 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 At one point EF improved to 45% from 20%.  She also has a history VT with Medtronic ICD placed in [place]. In 12/17, she was  admitted to [facility] with new onset atrial fibrillation/RVR and volume overload. Required IV diuresis and cardizem drip.  She remained in atrial fibrillation.  Warfarin was started that admission.    On 03/26/16, she saw Dr Antoine Poche and she was set up TEE/DCCV. Creatinine at that time was 2.03.  She had had a functional decline and dyspnea at rest. Weight at home had been trending up from 290 to 309 pounds.  SBP in 90s at Dr. Jenene Slicker office.    On 04/08/16, she presented for scheduled TEE/DC-CV, however thrombus was noted in LA appendage so DC-CV was not pursued. TEE also showed severe LV dysfunction. Dyspneic at rest. SBP soft.  She was admitted and ultimately required initiation of milrinone gtt + diuresis with IV Lasix and metolazone.  She lost > 20 lbs.  We were able to titrate her off milrinone and she was sent home on torsemide.  Rate was difficult to control, Toprol XL was titrated up to 75 mg bid.   At appointment in 2/18, she was noted to be back in NSR.  She remains in NSR today.   She had gastric bypass in 3/19 at [facility].   She saw Dr. Johney Frame for evaluation for possible afib ablation.  He recommended initial weight loss + amiodarone, then try to stop amiodarone after she has lost a fair amount of weight. She lost weight and is now off amiodarone.  Echo in 8/19 showed EF 40-45%.  Echo in 8/20 showed EF 40-45%, normal RV.  Echo in 5/22 showed EF 50% with mild diffuse hypokinesis and normal RV.  Echo in 9/23 showed EF 50-55%, normal RV, IVC normal.   She returns for followup of CHF.  Weight up about 4 lbs.  She had multiple UTIs so Marcelline Deist was stopped.  She uses torsemide rarely.  No exertional dyspnea  or chest pain.  No orthopnea/PND.  She rarely feels palpitations, usually after caffeine. She no longer uses CPAP and does not snore anymore.   Labs (1/18): digoxin 0.5, K 4.5, creatinine 2.13, digoxin 1.7, AST 53, ALT 34, TSH elevated Labs (2/18): K 3.9, creatinine 1.98 => 1.6, TSH 14, normal free T3 and free T4, LFTs normal, digoxin 1.7 Labs (3/18): K 3.8, creatinine 1.8, digoxin 1.0 Labs (12/18): K 4, creatinine 1.62 Labs (3/19): creatinine 2.06 Labs (7/19): K 4, creatinine 1.66 Labs (8/19): hgb 12.7 Labs (9/19): K 4.4, creatinine 1.44 Labs (12/19): K 3.8, creatinine 1.61 Labs (2/20): K 3.6, creatinine 1.7 Labs (7/20): K 4, creatinine 2.39, TSH low, free T4 high, T3 normal Labs (11/20): K 3.6, creatinine 1.42 Labs (3/22): K 4.2, creatinine 1.63 Labs (3/23): K 4.1, creatinine 1.63, hgb 13.9 Labs (11/24): K 3.8, creatinine 1.63  Medtronic device interrogated: Stable thoracic impedance, no VT, rare short AF runs.    ECG (personally reviewed): NSR, poor RWP.   PMH: 1. Chronic systolic CHF: Nonischemic cardiomyopathy.  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  EF initially 20%, rose to 45% at one point.   - h/o VT => Medtronic ICD.  - Echo (1/18): EF 25-30%, mildly dilated RV with normal systolic function, moderate TR, PASP 31 mmHg.  - LHC/RHC (1/18): No CAD; mean  RA 19, PA 46/20 mean 35, mean PCWP 24, CI 2.37 (Thermo), CI 3.16 (Fick), PVR 1.9 WU.  - Echo (8/19): EF 40-45%, severe LV dilation, no significant MR - Echo (8/20): EF 40-45%, normal RV - Echo (5/22): EF 50% with mild diffuse hypokinesis and normal RV. - Echo (9/23): EF 50-55%, normal RV, IVC normal. 2. Atrial fibrillation: Persistent.  Diagnosed 12/17 when she presented to [facility] with afib/RVR.  TEE/DCCV set up in 1/18 but no DCCV due to LA thrombus on TEE.   3. Gout 4. Type II diabetes 5. HTN 6. OSA: Uses CPAP 7. CKD stage III: Has history of atrophic left kidney  8. Hypothyroidism 9. VT: 1/18,  had ICD discharge.  10. Gastric bypass 3/19.  11. OA 12. Gout  SH: Divorced, on disability, lives with parents in [place], nonsmoker, no ETOH, no drugs.   Family History  Problem Relation Age of Onset   Other Mother        has chronic pain syndrome from DDD with spinal cord stimulator   Hypertension Mother    COPD Father    Arthritis/Rheumatoid Father    ROS: All systems reviewed and negative except as per HPI.   Current Outpatient Medications  Medication Sig Dispense Refill   acetaminophen (TYLENOL) 500 MG tablet Take 1,000 mg by mouth every 6 (six) hours as needed for mild pain or moderate pain.     apixaban (ELIQUIS) 5 MG TABS tablet TAKE 1 TABLET(5 MG) BY MOUTH TWICE DAILY 180 tablet 3   Calcium Citrate-Vitamin D 250-200 MG-UNIT TABS Take 1 tablet by mouth daily.     diphenhydramine-acetaminophen (TYLENOL PM) 25-500 MG TABS tablet Take 1 tablet by mouth at bedtime as needed (sleep).     ENTRESTO 24-26 MG TAKE 1 TABLET BY MOUTH 2 (TWO) TIMES DAILY. NEEDS APPT FOR FURTHER REFILLS 180 tablet 0   ferrous sulfate 325 (65 FE) MG tablet Take 325 mg by mouth daily with breakfast.     levothyroxine (SYNTHROID) 25 MCG tablet TAKE 1 TABLET(25 MCG) BY MOUTH DAILY BEFORE BREAKFAST 90 tablet 0   Melatonin 1 MG CHEW Chew 1 tablet by mouth as needed.     metoprolol succinate (TOPROL-XL) 100 MG 24 hr tablet TAKE 1 TABLET(100 MG) BY MOUTH TWICE DAILY WITH OR IMMEDIATELY FOLLOWING A MEAL 180 tablet 1   Multiple Vitamin (MULTIVITAMIN) tablet Take 1 tablet by mouth daily.     torsemide (DEMADEX) 20 MG tablet Take 1 tablet (20 mg total) by mouth as needed. 30 tablet 5   vitamin B-12 (CYANOCOBALAMIN) 1000 MCG tablet Take 1,000 mcg by mouth daily.     No current facility-administered medications for this encounter.   BP 120/70   Pulse 62   Wt 92.1 kg (203 lb)   LMP 05/30/2013 (Approximate)   SpO2 100%   BMI 31.79 kg/m  General: NAD Neck: No JVD, no thyromegaly or thyroid nodule.  Lungs:  Clear to auscultation bilaterally with normal respiratory effort. CV: Nondisplaced PMI.  Heart regular S1/S2, no S3/S4, no murmur.  No peripheral edema.  No carotid bruit.  Normal pedal pulses.  Abdomen: Soft, nontender, no hepatosplenomegaly, no distention.  Skin: Intact without lesions or rashes.  Neurologic: Alert and oriented x 3.  Psych: Normal affect. Extremities: No clubbing or cyanosis.  HEENT: Normal.   Assessment/Plan: 1. Chronic systolic CHF: Echo 1/18 with EF 25-30%, nonischemic cardiomyopathy.  Cardiomyopathy known since 59.  Possibly peri-partum. SPEP negative.  Has Medtronic ICD.  LHC (1/18) with no coronary  disease.  She was admitted in 1/18 with extensive diuresis, required milrinone due to low output but was able to titrate off.  When RHC was done, cardiac output was ok off milrinone. Echo in 5/22 showed EF up to 50%, echo in 9/23 showed EF 50-55% with normal RV.  NYHA class I-II symptoms now, she is not volume overloaded on exam or by Optivol.   - She can continue to use torsemide prn (has not needed).   - Continue Toprol XL 100 mg bid.    - Continue Entresto 24/26 bid. BMET/BNP today.  - She is off [place] due to frequent UTIs.  - I will arrange for repeat echo, borderline decreased LV systolic function on 9/23 echo.  2. Atrial flutter/fibrillation:  Noted in 12/17.  TEE in 1/18 showed LA appendage thrombus. She is now on Eliquis. Suspect atrial fibrillation with RVR drove her last CHF decompensation.  She remains in NSR today, now off amiodarone. She has rare short AF runs on device interrogation.   - If atrial fibrillation recurs off amiodarone, would favor atrial fibrillation ablation.  - Continue Toprol XL 100 bid.   - Continue Eliquis.  3. CKD: Stage III.  She has an atrophic left kidney.   - BMET today.  - She had to stop [place] with UTIs.  - I will see if we can get finerenone for her (will send hgbA1c to see if she is in diabetic range).  4. H/o VT: Has  Medtronic ICD. 5. OSA: Off CPAP but no longer snores with weight loss.    Followup in 6 months with APP.   Marca Ancona 03/18/2023

## 2023-03-30 ENCOUNTER — Ambulatory Visit: Payer: Medicare Other | Attending: Internal Medicine

## 2023-03-30 DIAGNOSIS — Z95810 Presence of automatic (implantable) cardiac defibrillator: Secondary | ICD-10-CM

## 2023-03-30 DIAGNOSIS — I5022 Chronic systolic (congestive) heart failure: Secondary | ICD-10-CM | POA: Diagnosis not present

## 2023-04-02 ENCOUNTER — Other Ambulatory Visit (HOSPITAL_COMMUNITY): Payer: Self-pay | Admitting: Cardiology

## 2023-04-02 ENCOUNTER — Encounter: Payer: Self-pay | Admitting: Cardiology

## 2023-04-16 ENCOUNTER — Ambulatory Visit (HOSPITAL_COMMUNITY)
Admission: RE | Admit: 2023-04-16 | Discharge: 2023-04-16 | Disposition: A | Discharge status: Home / Self Care | Payer: Medicare Other | Source: Ambulatory Visit | Attending: Internal Medicine | Admitting: Internal Medicine

## 2023-04-16 ENCOUNTER — Telehealth (HOSPITAL_COMMUNITY): Payer: Self-pay

## 2023-04-16 DIAGNOSIS — E119 Type 2 diabetes mellitus without complications: Secondary | ICD-10-CM | POA: Insufficient documentation

## 2023-04-16 DIAGNOSIS — I4891 Unspecified atrial fibrillation: Secondary | ICD-10-CM | POA: Diagnosis not present

## 2023-04-16 DIAGNOSIS — I110 Hypertensive heart disease with heart failure: Secondary | ICD-10-CM | POA: Diagnosis not present

## 2023-04-16 DIAGNOSIS — I5022 Chronic systolic (congestive) heart failure: Secondary | ICD-10-CM | POA: Diagnosis present

## 2023-04-16 LAB — ECHOCARDIOGRAM COMPLETE
Area-P 1/2: 3.77 cm2
Calc EF: 63.2 %
S' Lateral: 4.2 cm
Single Plane A2C EF: 61.6 %
Single Plane A4C EF: 65.6 %

## 2023-04-16 NOTE — Telephone Encounter (Signed)
Spoke with patient about echocardiogram results.

## 2023-04-16 NOTE — Telephone Encounter (Signed)
-----   Message from Marca Ancona sent at 04/16/2023  3:28 PM EST ----- EF low normal, this is ok.

## 2023-05-04 ENCOUNTER — Ambulatory Visit: Payer: Medicare Other | Attending: Internal Medicine

## 2023-05-04 DIAGNOSIS — I5022 Chronic systolic (congestive) heart failure: Secondary | ICD-10-CM

## 2023-05-04 DIAGNOSIS — Z95810 Presence of automatic (implantable) cardiac defibrillator: Secondary | ICD-10-CM

## 2023-06-05 ENCOUNTER — Ambulatory Visit: Payer: Medicare Other

## 2023-06-05 DIAGNOSIS — I5022 Chronic systolic (congestive) heart failure: Secondary | ICD-10-CM

## 2023-06-07 LAB — CUP PACEART REMOTE DEVICE CHECK
Battery Remaining Longevity: 79 mo
Battery Voltage: 2.99 V
Brady Statistic RV Percent Paced: 0.9 %
Date Time Interrogation Session: 20250307012403
HighPow Impedance: 47 Ohm
Implantable Lead Connection Status: 753985
Implantable Lead Implant Date: 20191203
Implantable Lead Location: 753860
Implantable Pulse Generator Implant Date: 20191203
Lead Channel Impedance Value: 285 Ohm
Lead Channel Impedance Value: 342 Ohm
Lead Channel Pacing Threshold Amplitude: 1 V
Lead Channel Pacing Threshold Pulse Width: 0.4 ms
Lead Channel Sensing Intrinsic Amplitude: 6.25 mV
Lead Channel Sensing Intrinsic Amplitude: 6.25 mV
Lead Channel Setting Pacing Amplitude: 2.5 V
Lead Channel Setting Pacing Pulse Width: 0.4 ms
Lead Channel Setting Sensing Sensitivity: 0.3 mV
Zone Setting Status: 755011
Zone Setting Status: 755011

## 2023-06-08 ENCOUNTER — Ambulatory Visit: Payer: Medicare Other | Attending: Internal Medicine

## 2023-06-08 DIAGNOSIS — I5022 Chronic systolic (congestive) heart failure: Secondary | ICD-10-CM | POA: Diagnosis not present

## 2023-06-08 DIAGNOSIS — Z95810 Presence of automatic (implantable) cardiac defibrillator: Secondary | ICD-10-CM | POA: Diagnosis not present

## 2023-06-11 NOTE — Progress Notes (Signed)
 EPIC Encounter for ICM Monitoring  Patient Name: Erin Jarvis is a 58 y.o. female Date: 06/11/2023 Primary Care Physican: Patient, No Pcp Per Primary Cardiologist: Hochrein/McLean Electrophysiologist: Ladona Ridgel Nephrologist: Dr Signe Colt at [facility] 07/03/2021 Weight:  200 lbs 03/07/2022 Weight: 200 lbs 05/23/2022 Weight: 200 lbs 10/06/2022 Weight: 197 lbs 03/17/2023 Office Weight: 203 lbs   Since 05-Jun-2023 Time in AF  <0.1 hr/day (<0.1%)           Attempted call to patient and unable to reach.  Left detailed message per DPR regarding transmission.  Transmission results reviewed.    OptiVol Thoracic impedance suggesting normal fluid levels.    Prescribed: Torsemide 20 mg take 1 tablet as needed.   Labs: 03/17/2023 Creatinine 1.39, BUN 21, Potassium 4.0, Sodium 139, GFR 44 07/18/2022 Creatinine 1.63, BUN 42, Potassium 3.8, Sodium 138, GFR 36   A complete set of results can be found in Results Review.   Recommendations:  Left voice mail with ICM number and encouraged to call if experiencing any fluid symptoms.   Follow-up plan: ICM clinic phone appointment on 07/13/2023.   91 day device clinic remote transmission 09/04/2023.     EP/ Cardiology Next Visit:  Left message to call Dr Lubertha Basque scheduler for April appt.  Recall 07/02/2023 with Dr Ladona Ridgel.   07/16/2023 with HF clinic.   Copy of ICM check sent to Dr. Ladona Ridgel.  3 month ICM trend: 06/08/2023.    12-14 Month ICM trend:     Karie Soda, RN 06/11/2023 8:12 AM

## 2023-06-21 ENCOUNTER — Other Ambulatory Visit (HOSPITAL_COMMUNITY): Payer: Self-pay | Admitting: Cardiology

## 2023-06-30 ENCOUNTER — Other Ambulatory Visit (HOSPITAL_COMMUNITY): Payer: Self-pay | Admitting: Cardiology

## 2023-07-09 NOTE — Addendum Note (Signed)
 Addended by: Elease Etienne A on: 07/09/2023 01:41 PM   Modules accepted: Orders

## 2023-07-09 NOTE — Progress Notes (Signed)
 Remote ICD transmission.

## 2023-07-13 ENCOUNTER — Ambulatory Visit: Attending: Internal Medicine

## 2023-07-13 DIAGNOSIS — I5022 Chronic systolic (congestive) heart failure: Secondary | ICD-10-CM

## 2023-07-13 DIAGNOSIS — Z95810 Presence of automatic (implantable) cardiac defibrillator: Secondary | ICD-10-CM | POA: Diagnosis not present

## 2023-07-15 ENCOUNTER — Telehealth: Payer: Self-pay

## 2023-07-15 ENCOUNTER — Telehealth (HOSPITAL_COMMUNITY): Payer: Self-pay

## 2023-07-15 NOTE — Progress Notes (Signed)
 EPIC Encounter for ICM Monitoring  Patient Name: Erin Jarvis is a 58 y.o. female Date: 07/15/2023 Primary Care Physican: Patient, No Pcp Per Primary Cardiologist: Hochrein/McLean Electrophysiologist: Carolynne Citron Nephrologist: Dr Christianne Cowper at [facility] 07/03/2021 Weight:  200 lbs 03/07/2022 Weight: 200 lbs 05/23/2022 Weight: 200 lbs 10/06/2022 Weight: 197 lbs 03/17/2023 Office Weight: 203 lbs   Since 08-Jun-2023 Time in AF  0.1 hr/day (0.4%)           Attempted call to patient and unable to reach.   Transmission results reviewed.    OptiVol Thoracic impedance suggesting normal fluid levels.    Prescribed: Torsemide 20 mg take 1 tablet as needed.   Labs: 03/17/2023 Creatinine 1.39, BUN 21, Potassium 4.0, Sodium 139, GFR 44 07/18/2022 Creatinine 1.63, BUN 42, Potassium 3.8, Sodium 138, GFR 36   A complete set of results can be found in Results Review.   Recommendations: Unable to reach.     Follow-up plan: ICM clinic phone appointment on 08/17/2023.   91 day device clinic remote transmission 09/04/2023.     EP/ Cardiology Next Visit:  Recall 07/02/2023 with Dr Carolynne Citron.   07/16/2023 with HF clinic.   Copy of ICM check sent to Dr. Carolynne Citron.  3 month ICM trend: 07/13/2023.    12-14 Month ICM trend:     Almyra Jain, RN 07/15/2023 4:33 PM

## 2023-07-15 NOTE — Telephone Encounter (Signed)
 Remote ICM transmission received.  Attempted call to patient regarding ICM remote transmission and no answer or voice mail option.

## 2023-07-15 NOTE — Telephone Encounter (Signed)
 Called to confirm/remind patient of their appointment at the [facility] on 07/16/23.   Appointment:   [] Confirmed  [] Left mess   [x] No answer/No voice mail  [] Phone not in service

## 2023-07-16 ENCOUNTER — Encounter (HOSPITAL_COMMUNITY): Payer: Medicare Other

## 2023-08-04 ENCOUNTER — Telehealth (HOSPITAL_COMMUNITY): Payer: Self-pay

## 2023-08-04 NOTE — Telephone Encounter (Signed)
 Called to confirm/remind patient of their appointment at the [facility] on 08/05/2023  10:30.   Appointment:   [] Confirmed  [] Left mess   [] No answer/No voice mail  [] VM Full/unable to leave message  [x] Phone not in service  Patient reminded to bring all medications and/or complete list.  Confirmed patient has transportation. Gave directions, instructed to utilize valet parking.

## 2023-08-05 ENCOUNTER — Other Ambulatory Visit (HOSPITAL_COMMUNITY): Payer: Self-pay

## 2023-08-05 ENCOUNTER — Encounter (HOSPITAL_COMMUNITY): Payer: Self-pay

## 2023-08-05 ENCOUNTER — Ambulatory Visit (HOSPITAL_COMMUNITY)
Admission: RE | Admit: 2023-08-05 | Discharge: 2023-08-05 | Disposition: A | Discharge status: Home / Self Care | Source: Ambulatory Visit | Attending: Physician Assistant | Admitting: Physician Assistant

## 2023-08-05 VITALS — BP 124/82 | HR 55 | Wt 200.4 lb

## 2023-08-05 DIAGNOSIS — I110 Hypertensive heart disease with heart failure: Secondary | ICD-10-CM | POA: Diagnosis not present

## 2023-08-05 DIAGNOSIS — Z7901 Long term (current) use of anticoagulants: Secondary | ICD-10-CM | POA: Diagnosis not present

## 2023-08-05 DIAGNOSIS — Z95810 Presence of automatic (implantable) cardiac defibrillator: Secondary | ICD-10-CM | POA: Insufficient documentation

## 2023-08-05 DIAGNOSIS — Z87440 Personal history of urinary (tract) infections: Secondary | ICD-10-CM | POA: Insufficient documentation

## 2023-08-05 DIAGNOSIS — Z9884 Bariatric surgery status: Secondary | ICD-10-CM | POA: Insufficient documentation

## 2023-08-05 DIAGNOSIS — I5032 Chronic diastolic (congestive) heart failure: Secondary | ICD-10-CM | POA: Insufficient documentation

## 2023-08-05 DIAGNOSIS — I428 Other cardiomyopathies: Secondary | ICD-10-CM | POA: Insufficient documentation

## 2023-08-05 DIAGNOSIS — Z79899 Other long term (current) drug therapy: Secondary | ICD-10-CM | POA: Insufficient documentation

## 2023-08-05 DIAGNOSIS — I480 Paroxysmal atrial fibrillation: Secondary | ICD-10-CM | POA: Diagnosis not present

## 2023-08-05 DIAGNOSIS — I4891 Unspecified atrial fibrillation: Secondary | ICD-10-CM | POA: Diagnosis not present

## 2023-08-05 DIAGNOSIS — G4733 Obstructive sleep apnea (adult) (pediatric): Secondary | ICD-10-CM | POA: Diagnosis not present

## 2023-08-05 DIAGNOSIS — N1832 Chronic kidney disease, stage 3b: Secondary | ICD-10-CM | POA: Diagnosis not present

## 2023-08-05 LAB — BRAIN NATRIURETIC PEPTIDE: B Natriuretic Peptide: 511.1 pg/mL — ABNORMAL HIGH (ref 0.0–100.0)

## 2023-08-05 LAB — BASIC METABOLIC PANEL WITH GFR
Anion gap: 9 (ref 5–15)
BUN: 19 mg/dL (ref 6–20)
CO2: 21 mmol/L — ABNORMAL LOW (ref 22–32)
Calcium: 9.2 mg/dL (ref 8.9–10.3)
Chloride: 109 mmol/L (ref 98–111)
Creatinine, Ser: 1.47 mg/dL — ABNORMAL HIGH (ref 0.44–1.00)
GFR, Estimated: 41 mL/min — ABNORMAL LOW (ref >60)
Glucose, Bld: 86 mg/dL (ref 70–99)
Potassium: 4.7 mmol/L (ref 3.5–5.1)
Sodium: 139 mmol/L (ref 135–145)

## 2023-08-05 MED ORDER — APIXABAN 5 MG PO TABS: ORAL_TABLET | ORAL | 3 refills | Status: AC

## 2023-08-05 MED ORDER — ENTRESTO 24-26 MG PO TABS: 1.0000 | ORAL_TABLET | Freq: Two times a day (BID) | ORAL | 3 refills | Status: AC

## 2023-08-05 NOTE — Progress Notes (Addendum)
 ADVANCED HF CLINIC NOTE PCP: Erin Jarvis HF Cardiology: Erin Jarvis  Reason for Visit: Heart Failure Follow-up  HPI: Erin Jarvis is a 58 y.o. 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 She also has a history VT with Medtronic ICD placed in [place]. H/o atrial fibrillation/RVR dating back to 2017 (on Eliquis ).   Presented for scheduled TEE/DC-CV 03/2016, however thrombus was noted in LA appendage so DC-CV was not pursued. TEE also showed severe LV dysfunction. She was admitted and ultimately required milrinone  gtt + diuresis. Rate was difficult to control. At appointment in 2/18, she was noted to be back in NSR.    She had gastric bypass in 3/19 at [facility].   She saw Erin Jarvis for evaluation for possible AFib ablation. He recommended weight loss + amiodarone . She has lost weight and now off amio.    Echo in 8/19 showed EF 40-45%.  Echo in 8/20 showed EF 40-45%, normal RV.  Echo in 5/22 showed EF 50% with mild diffuse hypokinesis and normal RV.  Echo in 9/23 showed EF 50-55%, normal RV, IVC normal. Echo 01/25: EF 50-55%, RV okay  Here today for CHF follow-up. Has been doing well from cardiac standpoint. No dyspnea, chest pain, orthopnea, PND or lower extremity edema. Weight stable. Post gastric bypass weight was down to 175 lb (from 290 lb), more recently has been stable around 200 lb. She had been exercising at one point but had been under a lot of stress caring for her mother with advanced dementia until she passed recently. Taking all meds as prescribed. Had been getting patient assistance for eliquis  and entresto  but continuation was recently denied.   She no longer uses CPAP and does not snore anymore after weight loss.    SH: Divorced, on disability, lives in [place], nonsmoker, no ETOH, no drugs.   Family History  Problem Relation Age of Onset   Other Mother        has chronic pain syndrome from DDD with spinal cord stimulator    Hypertension Mother    COPD Father    Arthritis/Rheumatoid Father    ROS: All systems reviewed and negative except as per HPI.   Current Outpatient Medications  Medication Sig Dispense Refill   acetaminophen  (TYLENOL ) 500 MG tablet Take 1,000 mg by mouth every 6 (six) hours as needed for mild pain or moderate pain.     Calcium  Citrate-Vitamin D  250-200 MG-UNIT TABS Take 1 tablet by mouth daily.     citalopram  (CELEXA ) 10 MG tablet Take 10 mg by mouth daily.     diphenhydramine -acetaminophen  (TYLENOL  PM) 25-500 MG TABS tablet Take 1 tablet by mouth at bedtime as needed (sleep).     ferrous sulfate  325 (65 FE) MG tablet Take 325 mg by mouth daily with breakfast.     levothyroxine  (SYNTHROID ) 25 MCG tablet TAKE 1 TABLET(25 MCG) BY MOUTH DAILY BEFORE BREAKFAST 90 tablet 0   Melatonin 1 MG CHEW Chew 1 tablet by mouth as needed.     metoprolol  succinate (TOPROL -XL) 100 MG 24 hr tablet TAKE 1 TABLET(100 MG) BY MOUTH TWICE DAILY WITH OR IMMEDIATELY FOLLOWING A MEAL 180 tablet 1   Multiple Vitamin (MULTIVITAMIN) tablet Take 1 tablet by mouth daily.     torsemide  (DEMADEX ) 20 MG tablet Take 1 tablet (20 mg total) by mouth as needed. 30 tablet 5   vitamin B-12 (CYANOCOBALAMIN ) 1000 MCG tablet Take 1,000 mcg by mouth daily.     apixaban  (ELIQUIS )  5 MG TABS tablet TAKE 1 TABLET(5 MG) BY MOUTH TWICE DAILY 180 tablet 3   sacubitril -valsartan  (ENTRESTO ) 24-26 MG Take 1 tablet by mouth 2 (two) times daily. TAKE 1 TABLET BY MOUTH 2 (TWO) TIMES DAILY. 180 tablet 3   No current facility-administered medications for this encounter.   Filed Weights   08/05/23 1026  Weight: 90.9 kg (200 lb 6.4 oz)    Vitals:   08/05/23 1026 08/05/23 1120  BP: (!) 140/80 124/82  Pulse: (!) 55   SpO2: 98%    Recheck BP 124/82  Physical Exam: General: Well appearing. Ambulated into clinic. Neck: no JVD.  Cor: PMI nondisplaced. Regular rate & rhythm. No rubs, gallops or murmurs. Lungs: clear Abdomen: soft, nontender,  nondistended.  Extremities: no edema Neuro: alert & orientedx3. Affect pleasant   Medtronic device interrogated: OptiVol low, thoracic impedance above threshold, activity about 1.5 hrs/day, rare AT/AF episodes binned (? False AF).  ECG Sinus brady 55 bpm, PVC  Assessment/Plan: 1. HF with improved EF: Nonischemic cardiomyopathy.  Cardiomyopathy known since 70.  Possibly peri-partum. SPEP negative.  Has Medtronic ICD.  LHC (1/18) with no coronary disease. Echo in 5/22 showed EF up to 50%, most recent echo 1/25 EF stable 50-55%, G1DD. - NYHA class I-II. Volume looks good on exam and by OptiVol. Has Torsemide  to use PRN. - Continue Toprol  XL 100 mg bid.    - Continue Entresto  24/26 bid. Will touch base with HF Pharmacy team to review assistance programs - She is off Farxiga  due to frequent UTIs.  - BP initially elevated but improved on recheck. No changes today. Monitor at home. - Encouraged routine exercise. - Labs today  2. Atrial flutter/fibrillation:  Noted in 12/17.  TEE in 1/18 showed LA appendage thrombus. She is now on Eliquis . Suspect atrial fibrillation with RVR drove her last CHF decompensation.  Rare AT/AF episodes binned on device (? False). - If atrial fibrillation recurs off amiodarone , would favor atrial fibrillation ablation.  - Continue Toprol  XL 100 bid.   - Continue Eliquis  5 mg bid, look into patient assistance with HF Pharmacy team. Given samples.  3. CKD: Stage IIIb.  She has an atrophic left kidney.   - Off Farxiga  with UTIs.  - Does not qualify for finerenone with normal A1c - BMET today  4. H/o VT: Has Medtronic ICD. - No episodes on device check  5. OSA: Off CPAP but no longer snores with weight loss.    Follow up with APP in 6 months  [facility], Erin Jarvis N 08/05/2023

## 2023-08-05 NOTE — Patient Instructions (Addendum)
 Medication Changes:  No Changes In Medications at this time.   PHARMACY DEPARTMENT WILL CONTACT YOU REGARDING THE ELIQUIS  AND ENTRESTO  ASSISTANCE- PLEASE CALL OUR OFFICE IF ANY ISSUES [phone] OPT 2  Lab Work:  Labs done today, your results will be available in MyChart, we will contact you for abnormal readings.  Follow-Up in: 6 months as scheduled with APP   At the [facility], you and your health needs are our priority. We have a designated team specialized in the treatment of Heart Failure. This Care Team includes your primary Heart Failure Specialized Cardiologist (physician), Advanced Practice Providers (APPs- Physician Assistants and Nurse Practitioners), and Pharmacist who all work together to provide you with the care you need, when you need it.   You may see any of the following providers on your designated Care Team at your next follow up:  Dr. Jules Oar Dr. Peder Bourdon Dr. Alwin Baars Dr. Judyth Nunnery Nieves Bars, NP Ruddy Corral, [place] [facility] [place], [place] Dennise Fitz, NP [place] Lee, NP Luster Salters, PharmD   Please be sure to bring in all your medications bottles to every appointment.   Need to Contact Us :  If you have any questions or concerns before your next appointment please send us  a message through [place] or call our office at [phone].    TO LEAVE A MESSAGE FOR THE NURSE SELECT OPTION 2, PLEASE LEAVE A MESSAGE INCLUDING: YOUR NAME DATE OF BIRTH CALL BACK NUMBER REASON FOR CALL**this is important as we prioritize the call backs  YOU WILL RECEIVE A CALL BACK THE SAME DAY AS LONG AS YOU CALL BEFORE 4:00 PM

## 2023-08-05 NOTE — Progress Notes (Signed)
 Medication Samples have been provided to the patient.  Drug name: ELIQUIS        Strength: 5MG         Qty: 2 BOXES  LOT: [id number]  Exp.Date: 08/2024  Dosing instructions: TAKE 1 TABLET TWICE DAILY   The patient has been instructed regarding the correct time, dose, and frequency of taking this medication, including desired effects and most common side effects.   Shametra Cumberland B Delsa Walder 11:17 AM 08/05/2023

## 2023-08-05 NOTE — Progress Notes (Signed)
 Medication Samples have been provided to the patient.  Drug name: ENTRESTO        Strength: 24/26MG         Qty: 2 BOTTLES  LOT: [id number]  Exp.Date: 06/2024  Dosing instructions: TAKE 1 TABLET TWICE DALIY   The patient has been instructed regarding the correct time, dose, and frequency of taking this medication, including desired effects and most common side effects.   Jahmeir Geisen B Briley Bumgarner 11:17 AM 08/05/2023

## 2023-08-06 ENCOUNTER — Telehealth (HOSPITAL_COMMUNITY): Payer: Self-pay

## 2023-08-06 ENCOUNTER — Other Ambulatory Visit (HOSPITAL_COMMUNITY): Payer: Self-pay

## 2023-08-06 NOTE — Telephone Encounter (Signed)
 Advanced Heart Failure Patient Advocate Encounter  The patient was approved for a [employer] grant that will help cover the cost of Entresto .  Total amount awarded, $4,500.  Effective: 07/07/2023 - 07/05/2024.  BIN [id number] PCN [id number] Group [id number] ID [id number]  Pharmacy provided with approval and processing information. Patient informed via phone.  Kennis Peacock, CPhT Rx Patient Advocate Phone: [phone]

## 2023-08-11 ENCOUNTER — Other Ambulatory Visit (HOSPITAL_COMMUNITY): Payer: Self-pay | Admitting: Cardiology

## 2023-08-11 DIAGNOSIS — I5022 Chronic systolic (congestive) heart failure: Secondary | ICD-10-CM

## 2023-08-17 ENCOUNTER — Ambulatory Visit: Attending: Internal Medicine

## 2023-08-17 DIAGNOSIS — I5032 Chronic diastolic (congestive) heart failure: Secondary | ICD-10-CM

## 2023-08-17 DIAGNOSIS — Z95810 Presence of automatic (implantable) cardiac defibrillator: Secondary | ICD-10-CM | POA: Diagnosis not present

## 2023-08-19 NOTE — Progress Notes (Signed)
 EPIC Encounter for ICM Monitoring  Patient Name: Erin Jarvis is a 58 y.o. female Date: 08/19/2023 Primary Care Physican: Patient, No Pcp Per Primary Cardiologist: Hochrein/McLean Electrophysiologist: Carolynne Citron Nephrologist: Dr Christianne Cowper at [facility] 07/03/2021 Weight:  200 lbs 03/07/2022 Weight: 200 lbs 05/23/2022 Weight: 200 lbs 10/06/2022 Weight: 197 lbs 03/17/2023 Office Weight: 203 lbs 08/19/2023 Weight: 200 lbs   Since 05-Aug-2023 Time in AF  0.0 hr/day (0.0%)          Spoke with patient and heart failure questions reviewed.  Transmission results reviewed.  Pt asymptomatic for fluid accumulation.  Reports feeling well at this time and voices no complaints.  Mother passed away in the last couple of months and she is getting support for the grieving process.      OptiVol Thoracic impedance suggesting normal fluid levels.    Prescribed: Torsemide  20 mg take 1 tablet as needed.   Labs: 08/05/2023 Creatinine 1.47, BUN 19, Potassium 4.7, Sodium 139, GFR 41 03/17/2023 Creatinine 1.39, BUN 21, Potassium 4.0, Sodium 139, GFR 44 07/18/2022 Creatinine 1.63, BUN 42, Potassium 3.8, Sodium 138, GFR 36   A complete set of results can be found in Results Review.   Recommendations: No changes and encouraged to call if experiencing any fluid symptoms.   Follow-up plan: ICM clinic phone appointment on 10/19/2023.   91 day device clinic remote transmission 09/04/2023.     EP/ Cardiology Next Visit:  Recall 07/02/2023 with Dr Carolynne Citron.   02/05/2024 with HF clinic.   Copy of ICM check sent to Dr. Carolynne Citron.  3 month ICM trend: 08/17/2023.    12-14 Month ICM trend:     Almyra Jain, RN 08/19/2023 2:36 PM

## 2023-09-04 ENCOUNTER — Ambulatory Visit (INDEPENDENT_AMBULATORY_CARE_PROVIDER_SITE_OTHER): Payer: Medicare Other

## 2023-09-04 DIAGNOSIS — I428 Other cardiomyopathies: Secondary | ICD-10-CM

## 2023-09-05 LAB — CUP PACEART REMOTE DEVICE CHECK
Battery Remaining Longevity: 74 mo
Battery Voltage: 2.99 V
Brady Statistic RV Percent Paced: 2.05 %
Date Time Interrogation Session: 20250606022603
HighPow Impedance: 52 Ohm
Implantable Lead Connection Status: 753985
Implantable Lead Implant Date: 20191203
Implantable Lead Location: 753860
Implantable Pulse Generator Implant Date: 20191203
Lead Channel Impedance Value: 266 Ohm
Lead Channel Impedance Value: 304 Ohm
Lead Channel Pacing Threshold Amplitude: 0.875 V
Lead Channel Pacing Threshold Pulse Width: 0.4 ms
Lead Channel Sensing Intrinsic Amplitude: 5.25 mV
Lead Channel Sensing Intrinsic Amplitude: 5.25 mV
Lead Channel Setting Pacing Amplitude: 2.5 V
Lead Channel Setting Pacing Pulse Width: 0.4 ms
Lead Channel Setting Sensing Sensitivity: 0.3 mV
Zone Setting Status: 755011
Zone Setting Status: 755011

## 2023-09-08 ENCOUNTER — Ambulatory Visit: Payer: Self-pay | Admitting: Cardiovascular Disease

## 2023-10-04 ENCOUNTER — Other Ambulatory Visit (HOSPITAL_COMMUNITY): Payer: Self-pay | Admitting: Cardiology

## 2023-10-16 ENCOUNTER — Telehealth (HOSPITAL_COMMUNITY): Payer: Self-pay | Admitting: Pharmacy Technician

## 2023-10-16 ENCOUNTER — Other Ambulatory Visit (HOSPITAL_COMMUNITY): Payer: Self-pay

## 2023-10-16 ENCOUNTER — Encounter (HOSPITAL_COMMUNITY): Payer: Self-pay

## 2023-10-16 NOTE — Progress Notes (Signed)
 Medication Samples have been provided to the patient.  Drug name: eliquis        Strength: 5 mg         Qty: 4 boxes  LOT: [id number]  Exp.Date: 06/26  Dosing instructions: take 1 tablet Twice daily   The patient has been instructed regarding the correct time, dose, and frequency of taking this medication, including desired effects and most common side effects.   Erin Jarvis M Janay Canan 11:04 AM 10/16/2023

## 2023-10-16 NOTE — Telephone Encounter (Signed)
 Advanced Heart Failure Patient Advocate Encounter  Patient called asking for Eliquis  help. Will provide month of samples. Even with grant, she has not yet met deductible for this year. Has also not met the 3% OOP requirement for Eliquis  assistance. Hard copy of prescription will be given, as requested.  Almarie JULIANNA Pa, CPhT

## 2023-10-19 ENCOUNTER — Ambulatory Visit: Attending: Internal Medicine

## 2023-10-19 DIAGNOSIS — Z95810 Presence of automatic (implantable) cardiac defibrillator: Secondary | ICD-10-CM | POA: Diagnosis not present

## 2023-10-19 DIAGNOSIS — I5032 Chronic diastolic (congestive) heart failure: Secondary | ICD-10-CM | POA: Diagnosis not present

## 2023-10-20 NOTE — Addendum Note (Signed)
 Addended by: VICCI SELLER A on: 10/20/2023 10:08 AM   Modules accepted: Orders

## 2023-10-20 NOTE — Progress Notes (Signed)
 Remote ICD transmission.

## 2023-10-22 NOTE — Progress Notes (Signed)
 EPIC Encounter for ICM Monitoring  Patient Name: Erin Jarvis is a 58 y.o. female Date: 10/22/2023 Primary Care Physican: Patient, No Pcp Per Primary Cardiologist: Hochrein/McLean Electrophysiologist: Waddell Nephrologist: Dr Gearline at [facility] 07/03/2021 Weight:  200 lbs 03/07/2022 Weight: 200 lbs 05/23/2022 Weight: 200 lbs 10/06/2022 Weight: 197 lbs 03/17/2023 Office Weight: 203 lbs 08/19/2023 Weight: 200 lbs   Since 04-Sep-2023 Time in AF  0.0 hr/day (0.0%)          Transmission results reviewed.        OptiVol Thoracic impedance suggesting normal fluid levels.    Prescribed: Torsemide  20 mg take 1 tablet as needed.   Labs: 08/05/2023 Creatinine 1.47, BUN 19, Potassium 4.7, Sodium 139, GFR 41 03/17/2023 Creatinine 1.39, BUN 21, Potassium 4.0, Sodium 139, GFR 44 07/18/2022 Creatinine 1.63, BUN 42, Potassium 3.8, Sodium 138, GFR 36   A complete set of results can be found in Results Review.   Recommendations: No changes.   Follow-up plan: ICM clinic phone appointment on 11/23/2023.   91 day device clinic remote transmission 12/04/2023.     EP/ Cardiology Next Visit:  Recall 07/02/2023 with Dr Waddell.   02/05/2024 with HF clinic.   Copy of ICM check sent to Dr. Waddell.  3 month ICM trend: 10/19/2023.    12-14 Month ICM trend:     Erin GORMAN Garner, RN 10/22/2023 5:11 PM

## 2023-11-23 ENCOUNTER — Ambulatory Visit: Attending: Internal Medicine

## 2023-11-23 DIAGNOSIS — I5032 Chronic diastolic (congestive) heart failure: Secondary | ICD-10-CM

## 2023-11-23 DIAGNOSIS — Z95810 Presence of automatic (implantable) cardiac defibrillator: Secondary | ICD-10-CM | POA: Diagnosis not present

## 2023-11-26 ENCOUNTER — Telehealth: Payer: Self-pay

## 2023-11-26 NOTE — Telephone Encounter (Signed)
 Remote ICM transmission received.  Attempted call to patient regarding ICM remote transmission and left detailed message per DPR.  Left ICM phone number and advised to return call for any fluid symptoms or questions. Next ICM remote transmission scheduled 12/28/2023.

## 2023-11-26 NOTE — Progress Notes (Signed)
 EPIC Encounter for ICM Monitoring  Patient Name: Erin Jarvis is a 58 y.o. female Date: 11/26/2023 Primary Care Physican: Patient, No Pcp Per Primary Cardiologist: Hochrein/McLean Electrophysiologist: Waddell Nephrologist: Dr Gearline at [facility] 07/03/2021 Weight:  200 lbs 03/07/2022 Weight: 200 lbs 05/23/2022 Weight: 200 lbs 10/06/2022 Weight: 197 lbs 03/17/2023 Office Weight: 203 lbs 08/19/2023 Weight: 200 lbs   Since 19-Oct-2023 Time in AF  0.0 hr/day (0.0%)          Attempted call to patient and unable to reach.  Left detailed message per DPR regarding transmission.  Transmission results reviewed.     OptiVol Thoracic impedance suggesting normal fluid levels.    Prescribed: Torsemide  20 mg take 1 tablet as needed.   Labs: 08/05/2023 Creatinine 1.47, BUN 19, Potassium 4.7, Sodium 139, GFR 41 03/17/2023 Creatinine 1.39, BUN 21, Potassium 4.0, Sodium 139, GFR 44 07/18/2022 Creatinine 1.63, BUN 42, Potassium 3.8, Sodium 138, GFR 36   A complete set of results can be found in Results Review.   Recommendations: Left voice mail with ICM number and encouraged to call if experiencing any fluid symptoms.   Follow-up plan: ICM clinic phone appointment on 12/28/2023.   91 day device clinic remote transmission 12/04/2023.     EP/ Cardiology Next Visit:  02/05/2024 with HF clinic.  Recall 07/02/2023 with Dr Waddell.   02/05/2024 with HF clinic.   Copy of ICM check sent to Dr. Waddell.  3 month ICM trend: 11/23/2023.    12-14 Month ICM trend:     Erin GORMAN Garner, RN 11/26/2023 3:26 PM

## 2023-12-04 ENCOUNTER — Ambulatory Visit (INDEPENDENT_AMBULATORY_CARE_PROVIDER_SITE_OTHER): Payer: Medicare Other

## 2023-12-04 DIAGNOSIS — I428 Other cardiomyopathies: Secondary | ICD-10-CM

## 2023-12-05 LAB — CUP PACEART REMOTE DEVICE CHECK
Battery Remaining Longevity: 69 mo
Battery Voltage: 2.99 V
Brady Statistic RV Percent Paced: 0.83 %
Date Time Interrogation Session: 20250905001703
HighPow Impedance: 60 Ohm
Implantable Lead Connection Status: 753985
Implantable Lead Implant Date: 20191203
Implantable Lead Location: 753860
Implantable Pulse Generator Implant Date: 20191203
Lead Channel Impedance Value: 285 Ohm
Lead Channel Impedance Value: 342 Ohm
Lead Channel Pacing Threshold Amplitude: 0.75 V
Lead Channel Pacing Threshold Pulse Width: 0.4 ms
Lead Channel Sensing Intrinsic Amplitude: 7.375 mV
Lead Channel Sensing Intrinsic Amplitude: 7.375 mV
Lead Channel Setting Pacing Amplitude: 2.5 V
Lead Channel Setting Pacing Pulse Width: 0.4 ms
Lead Channel Setting Sensing Sensitivity: 0.3 mV
Zone Setting Status: 755011
Zone Setting Status: 755011

## 2023-12-07 ENCOUNTER — Telehealth: Payer: Self-pay

## 2023-12-07 NOTE — Telephone Encounter (Signed)
 Call x1; lvmtcb and text sent via home remote appt for ICD, to schedule pt w/ EP APP for overdue 1 yr f/u and programming on ICD.    Text sent to patient: Erin Jarvis, this is Dr. Marko office. You're due for a follow-up and programming on your ICD. Please call [phone] to schedule. Thanks!

## 2023-12-07 NOTE — Telephone Encounter (Signed)
 ICD Scheduled remote reviewed. Normal device function.   Presenting rhythm:  VS, irregular.   HF diagnostics normal in this monitoring period.    2 VHR detections, one EGM lasting 17 beats with morphology consistent with intrinsic and the other WCT lasting 21 beats falling below detection rate followed by 6 beat run of tachy.  Appears patient has VT beats below detection zone, also has hx of persistent AF with RVR. (Programmed VVI).  Patient is past due for follow up.  Needs to be seen and to evaluate episodes and detection zones at visit.  Needs A>BX pathways corrected.  Forwarding to EP scheduling team to get patient in soon for follow up.

## 2023-12-15 NOTE — Telephone Encounter (Signed)
 Patient returned call and spoke w/ [facility] Scheduler - she is scheduled for 9/19 w/ Jodie Passey, PA.

## 2023-12-16 ENCOUNTER — Ambulatory Visit: Payer: Self-pay | Admitting: Cardiovascular Disease

## 2023-12-17 NOTE — Progress Notes (Signed)
Remote ICD Transmission.

## 2023-12-18 ENCOUNTER — Ambulatory Visit: Attending: Student | Admitting: Student

## 2023-12-18 ENCOUNTER — Encounter: Payer: Self-pay | Admitting: Student

## 2023-12-18 VITALS — BP 118/73 | HR 59 | Ht 67.0 in | Wt 207.2 lb

## 2023-12-18 DIAGNOSIS — Z79899 Other long term (current) drug therapy: Secondary | ICD-10-CM | POA: Insufficient documentation

## 2023-12-18 DIAGNOSIS — I5032 Chronic diastolic (congestive) heart failure: Secondary | ICD-10-CM | POA: Diagnosis present

## 2023-12-18 LAB — CUP PACEART INCLINIC DEVICE CHECK
Battery Remaining Longevity: 69 mo
Battery Voltage: 2.96 V
Brady Statistic RV Percent Paced: 1.13 %
Date Time Interrogation Session: 20250919100130
HighPow Impedance: 53 Ohm
Implantable Lead Connection Status: 753985
Implantable Lead Implant Date: 20191203
Implantable Lead Location: 753860
Implantable Pulse Generator Implant Date: 20191203
Lead Channel Impedance Value: 266 Ohm
Lead Channel Impedance Value: 304 Ohm
Lead Channel Pacing Threshold Amplitude: 0.875 V
Lead Channel Pacing Threshold Pulse Width: 0.4 ms
Lead Channel Sensing Intrinsic Amplitude: 6.75 mV
Lead Channel Sensing Intrinsic Amplitude: 8 mV
Lead Channel Setting Pacing Amplitude: 2.5 V
Lead Channel Setting Pacing Pulse Width: 0.4 ms
Lead Channel Setting Sensing Sensitivity: 0.3 mV
Zone Setting Status: 755011
Zone Setting Status: 755011

## 2023-12-18 NOTE — Patient Instructions (Signed)
 Medication Instructions:  Your physician recommends that you continue on your current medications as directed. Please refer to the Current Medication list given to you today.  *If you need a refill on your cardiac medications before your next appointment, please call your pharmacy*  Lab Work: BMET, CBC, Magnesium  today  Testing/Procedures: NONE ordered at this time of appointment   Follow-Up: At [facility], you and your health needs are our priority.  As part of our continuing mission to provide you with exceptional heart care, our providers are all part of one team.  This team includes your primary Cardiologist (physician) and Advanced Practice Providers or APPs (Physician Assistants and Nurse Practitioners) who all work together to provide you with the care you need, when you need it.  Your next appointment:   1 year(s)  Provider:   Dr. Nancey    We recommend signing up for the patient portal called MyChart.  Sign up information is provided on this After Visit Summary.  MyChart is used to connect with patients for Virtual Visits (Telemedicine).  Patients are able to view lab/test results, encounter notes, upcoming appointments, etc.  Non-urgent messages can be sent to your provider as well.   To learn more about what you can do with MyChart, go to [email or web address].

## 2023-12-18 NOTE — Progress Notes (Signed)
  Electrophysiology Office Note:   ID:  [name], [name] [birth year], MRN [id number]  Primary Cardiologist: Ezra Shuck, MD Electrophysiologist: Eulas FORBES Furbish, MD      History of Present Illness:   Erin Jarvis is a 58 y.o. 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   Since last being seen in our clinic the patient reports doing well from a cardiac perspective. Was not aware of HVRs. Overall,  she denies chest pain, palpitations, PND, orthopnea, nausea, vomiting, dizziness, syncope, edema, weight gain, or early satiety.   She has some mild fatigue and dyspnea.  Lost her mom in February and continues to grieve.   Review of systems complete and found to be negative unless listed in HPI.   EP Information / Studies Reviewed:    EKG is ordered today. Personal review as below.  EKG Interpretation Date/Time:  Friday December 18 2023 09:41:18 EDT Ventricular Rate:  59 PR Interval:  178 QRS Duration:  80 QT Interval:  408 QTC Calculation: 403 R Axis:   -22  Text Interpretation: Sinus bradycardia Low voltage QRS Cannot rule out Anteroseptal infarct , age undetermined When compared with ECG of 05-Aug-2023 10:17, Sinus rhythm has replaced Electronic ventricular pacemaker Confirmed by Lesia Sharper [phone]) on 12/18/2023 9:45:46 AM    ICD Interrogation-  reviewed in detail today,  See PACEART report.  Arrhythmia/Device History ICD-Medtronic-Carelink--Optivol   ** ICM clinic **   DPR ON FILE FOR ALL [facility] FOR BRIANNA Carroll (DAUGHTER) [phone]. LEAVE MSG ON CELL PHONE VM [phone].  HF MD--Dr Shuck   Physical Exam:   VS:  BP 118/73 (BP Location: Left Arm, Patient Position: Sitting)   Pulse (!) 59   Ht 5' 7 (1.702 m)   Wt 207 lb 3.2 oz (94 kg)   LMP 05/30/2013 (Approximate)   SpO2 99%   BMI 32.45 kg/m    Wt Readings  from Last 3 Encounters:  12/18/23 207 lb 3.2 oz (94 kg)  08/05/23 200 lb 6.4 oz (90.9 kg)  03/17/23 203 lb (92.1 kg)     GEN: No acute distress  NECK: No JVD; No carotid bruits CARDIAC: Regular rate and rhythm, no murmurs, rubs, gallops RESPIRATORY:  Clear to auscultation without rales, wheezing or rhonchi  ABDOMEN: Soft, non-tender, non-distended EXTREMITIES:  No edema; No deformity   ASSESSMENT AND PLAN:    Chronic systolic CHF  s/p Medtronic single chamber ICD  Echo 04/2023 LVEF 50-55% euvolemic today Stable on an appropriate medical regimen Normal ICD function See Pace Art report AX>B Pathways adjusted per protocol  Paroxysmal AF HVR Episodes noted on device appear to more likely represent brief AF with RVR vs NSVT as they are irregular.  Continue eliquis  5 mg BID for CHA2DS2/VASc of at least 4 Labs today.   Disposition:   Follow up with EP Team in 12 months   Signed, Sharper Prentice Lesia, PA-C

## 2023-12-19 ENCOUNTER — Ambulatory Visit: Payer: Self-pay | Admitting: Cardiovascular Disease

## 2023-12-19 DIAGNOSIS — Z79899 Other long term (current) drug therapy: Secondary | ICD-10-CM

## 2023-12-19 LAB — CBC
Hematocrit: 36.7 % (ref 34.0–46.6)
Hemoglobin: 11.6 g/dL (ref 11.1–15.9)
MCH: 29.3 pg (ref 26.6–33.0)
MCHC: 31.6 g/dL (ref 31.5–35.7)
MCV: 93 fL (ref 79–97)
Platelets: 225 x10E3/uL (ref 150–450)
RBC: 3.96 x10E6/uL (ref 3.77–5.28)
RDW: 14.2 % (ref 11.7–15.4)
WBC: 15.8 x10E3/uL — ABNORMAL HIGH (ref 3.4–10.8)

## 2023-12-19 LAB — BASIC METABOLIC PANEL WITH GFR
BUN/Creatinine Ratio: 15 (ref 12–28)
BUN: 24 mg/dL (ref 8–27)
CO2: 16 mmol/L — ABNORMAL LOW (ref 20–29)
Calcium: 9.1 mg/dL (ref 8.7–10.3)
Chloride: 107 mmol/L — ABNORMAL HIGH (ref 96–106)
Creatinine, Ser: 1.63 mg/dL — ABNORMAL HIGH (ref 0.57–1.00)
Glucose: 69 mg/dL — ABNORMAL LOW (ref 70–99)
Potassium: 4.8 mmol/L (ref 3.5–5.2)
Sodium: 141 mmol/L (ref 134–144)
eGFR: 36 mL/min/1.73 — ABNORMAL LOW (ref >59)

## 2023-12-19 LAB — MAGNESIUM: Magnesium: 1.7 mg/dL (ref 1.6–2.3)

## 2023-12-28 ENCOUNTER — Other Ambulatory Visit: Payer: Self-pay | Admitting: *Deleted

## 2023-12-28 ENCOUNTER — Encounter

## 2023-12-28 DIAGNOSIS — Z79899 Other long term (current) drug therapy: Secondary | ICD-10-CM

## 2023-12-30 NOTE — Progress Notes (Signed)
 No ICM remote transmission received for 12/28/2023 and next ICM transmission scheduled for 01/18/2024.

## 2024-01-13 ENCOUNTER — Ambulatory Visit: Payer: Self-pay | Admitting: Student

## 2024-01-13 LAB — BASIC METABOLIC PANEL WITH GFR
BUN/Creatinine Ratio: 11 — ABNORMAL LOW (ref 12–28)
BUN: 17 mg/dL (ref 8–27)
CO2: 20 mmol/L (ref 20–29)
Calcium: 9.3 mg/dL (ref 8.7–10.3)
Chloride: 105 mmol/L (ref 96–106)
Creatinine, Ser: 1.5 mg/dL — ABNORMAL HIGH (ref 0.57–1.00)
Glucose: 85 mg/dL (ref 70–99)
Potassium: 4.4 mmol/L (ref 3.5–5.2)
Sodium: 140 mmol/L (ref 134–144)
eGFR: 40 mL/min/1.73 — ABNORMAL LOW (ref >59)

## 2024-01-13 LAB — MAGNESIUM: Magnesium: 2 mg/dL (ref 1.6–2.3)

## 2024-01-14 ENCOUNTER — Other Ambulatory Visit (HOSPITAL_COMMUNITY): Payer: Self-pay | Admitting: Cardiology

## 2024-01-18 ENCOUNTER — Encounter

## 2024-01-20 NOTE — Progress Notes (Signed)
 No ICM remote transmission received for 01/18/2024 and next ICM transmission scheduled for 02/01/2024.

## 2024-02-01 ENCOUNTER — Ambulatory Visit: Attending: Cardiovascular Disease

## 2024-02-01 DIAGNOSIS — I5022 Chronic systolic (congestive) heart failure: Secondary | ICD-10-CM | POA: Diagnosis not present

## 2024-02-01 DIAGNOSIS — Z95810 Presence of automatic (implantable) cardiac defibrillator: Secondary | ICD-10-CM

## 2024-02-03 NOTE — Progress Notes (Signed)
 EPIC Encounter for ICM Monitoring  Patient Name: Erin Jarvis is a 58 y.o. female Date: 02/03/2024 Primary Care Physican: Patient, No Pcp Per Primary Cardiologist: Hochrein/McLean Electrophysiologist: Mealor Nephrologist: Dr Gearline at [facility] 07/03/2021 Weight:  200 lbs 03/07/2022 Weight: 200 lbs 05/23/2022 Weight: 200 lbs 10/06/2022 Weight: 197 lbs 03/17/2023 Office Weight: 203 lbs 08/19/2023 Weight: 200 lbs 12/18/2023 Office Weight: 207.7 lbs 02/03/2024 Weight: 200 lbs   Since 18-Dec-2023 Time in AF  0.0 hr/day (0.0%)          Spoke with patient and heart failure questions reviewed.  Transmission results reviewed.  Pt asymptomatic for fluid accumulation.  Reports feeling well at this time and voices no complaints.     OptiVol Thoracic impedance suggesting normal fluid levels.    Prescribed: Torsemide  20 mg take 1 tablet as needed.   Labs: 08/05/2023 Creatinine 1.47, BUN 19, Potassium 4.7, Sodium 139, GFR 41 03/17/2023 Creatinine 1.39, BUN 21, Potassium 4.0, Sodium 139, GFR 44 07/18/2022 Creatinine 1.63, BUN 42, Potassium 3.8, Sodium 138, GFR 36   A complete set of results can be found in Results Review.   Recommendations: No changes and encouraged to call if experiencing any fluid symptoms.   Follow-up plan: ICM clinic phone appointment on 03/14/2024.   91 day device clinic remote transmission 03/04/2024.     EP/ Cardiology Next Visit:  02/05/2024 with HF clinic.  Recall 12/17/2024 with Dr Nancey.      Copy of ICM check sent to Dr. Nancey.    Remote monitoring is medically necessary for Heart Failure Management.    Daily Thoracic Impedance ICM trend: 11/03/2023 through 02/02/2024.    12-14 Month Thoracic Impedance ICM trend:     [name], RN 02/03/2024 1:42 PM

## 2024-02-05 ENCOUNTER — Other Ambulatory Visit (HOSPITAL_COMMUNITY): Payer: Self-pay

## 2024-02-05 ENCOUNTER — Ambulatory Visit (HOSPITAL_COMMUNITY)
Admission: RE | Admit: 2024-02-05 | Discharge: 2024-02-05 | Disposition: A | Discharge status: Home / Self Care | Source: Ambulatory Visit | Attending: Cardiology | Admitting: Cardiology

## 2024-02-05 ENCOUNTER — Telehealth (HOSPITAL_COMMUNITY): Payer: Self-pay

## 2024-02-05 VITALS — BP 124/80 | HR 61 | Wt 209.4 lb

## 2024-02-05 DIAGNOSIS — Z87440 Personal history of urinary (tract) infections: Secondary | ICD-10-CM | POA: Insufficient documentation

## 2024-02-05 DIAGNOSIS — Z9884 Bariatric surgery status: Secondary | ICD-10-CM | POA: Diagnosis not present

## 2024-02-05 DIAGNOSIS — Z7901 Long term (current) use of anticoagulants: Secondary | ICD-10-CM | POA: Insufficient documentation

## 2024-02-05 DIAGNOSIS — I110 Hypertensive heart disease with heart failure: Secondary | ICD-10-CM | POA: Diagnosis not present

## 2024-02-05 DIAGNOSIS — I428 Other cardiomyopathies: Secondary | ICD-10-CM | POA: Insufficient documentation

## 2024-02-05 DIAGNOSIS — I4891 Unspecified atrial fibrillation: Secondary | ICD-10-CM | POA: Diagnosis not present

## 2024-02-05 DIAGNOSIS — N1832 Chronic kidney disease, stage 3b: Secondary | ICD-10-CM | POA: Diagnosis not present

## 2024-02-05 DIAGNOSIS — G4733 Obstructive sleep apnea (adult) (pediatric): Secondary | ICD-10-CM | POA: Diagnosis not present

## 2024-02-05 DIAGNOSIS — I5022 Chronic systolic (congestive) heart failure: Secondary | ICD-10-CM | POA: Diagnosis present

## 2024-02-05 DIAGNOSIS — Z95810 Presence of automatic (implantable) cardiac defibrillator: Secondary | ICD-10-CM | POA: Diagnosis not present

## 2024-02-05 DIAGNOSIS — Z79899 Other long term (current) drug therapy: Secondary | ICD-10-CM | POA: Diagnosis not present

## 2024-02-05 NOTE — Progress Notes (Signed)
 ADVANCED HF CLINIC NOTE PCP: Dr. Orpha HF Cardiology: Dr. Rolan  Reason for Visit: Heart Failure Follow-up  HPI: Erin Jarvis is a 58 y.o. 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 She also has a history VT with Medtronic ICD placed in [place]. H/o atrial fibrillation/RVR dating back to 2017 (on Eliquis ).   Presented for scheduled TEE/DC-CV 03/2016, however thrombus was noted in LA appendage so DC-CV was not pursued. TEE also showed severe LV dysfunction. She was admitted and ultimately required milrinone  gtt + diuresis. Rate was difficult to control. At appointment in 2/18, she was noted to be back in NSR.    She had gastric bypass in 3/19 at [facility].   She saw Dr. Kelsie for evaluation for possible AFib ablation. He recommended weight loss + amiodarone . She has lost weight and now off amio.    Echo in 8/19 showed EF 40-45%.  Echo in 8/20 showed EF 40-45%, normal RV.  Echo in 5/22 showed EF 50% with mild diffuse hypokinesis and normal RV.  Echo in 9/23 showed EF 50-55%, normal RV, IVC normal. Echo 01/25: EF 50-55%, RV okay  Here today for CHF follow-up. Denies any cardiac symptoms. No exertional or resting dyspnea. No CP. No palpitations. No LEE, orthopnea/PND.Compliant w/ medications. No side effects. BP well controlled.   Device interrogation shows normal device function. Fluid index/optivol good. No AT/AF. No VT/VF.   She no longer uses CPAP and does not snore anymore after weight loss post gastric bypass.   She has been a little more depressed over the last year. Her mother passed away. Had to go to counseling and start an antidepressant. Subsequently, she has been less physically active but hopes to be able to restart more exercise at the start of the new year.   She works doing Research Scientist (physical Sciences). Activity level on device interrogation ~2 hr/day.    SH: Divorced, on disability, lives in [place], nonsmoker, no ETOH, no drugs.   Family  History  Problem Relation Age of Onset   Other Mother        has chronic pain syndrome from DDD with spinal cord stimulator   Hypertension Mother    COPD Father    Arthritis/Rheumatoid Father    ROS: All systems reviewed and negative except as per HPI.   Current Outpatient Medications  Medication Sig Dispense Refill   acetaminophen  (TYLENOL ) 500 MG tablet Take 1,000 mg by mouth every 6 (six) hours as needed for mild pain or moderate pain.     apixaban  (ELIQUIS ) 5 MG TABS tablet TAKE 1 TABLET(5 MG) BY MOUTH TWICE DAILY 180 tablet 3   Ascorbic Acid (VITAMIN C) 100 MG tablet Take 100 mg by mouth daily.     Calcium  Citrate-Vitamin D  250-200 MG-UNIT TABS Take 1 tablet by mouth daily.     citalopram  (CELEXA ) 10 MG tablet Take 10 mg by mouth daily.     diphenhydramine -acetaminophen  (TYLENOL  PM) 25-500 MG TABS tablet Take 1 tablet by mouth at bedtime as needed (sleep).     ferrous sulfate  325 (65 FE) MG tablet Take 325 mg by mouth daily with breakfast.     levothyroxine  (SYNTHROID ) 25 MCG tablet TAKE 1 TABLET(25 MCG) BY MOUTH DAILY BEFORE BREAKFAST 90 tablet 0   Magnesium  400 MG CAPS Take 1 tablet by mouth daily.     Melatonin 1 MG CHEW Chew 1 tablet by mouth as needed.     metoprolol  succinate (TOPROL -XL) 100 MG 24 hr tablet  TAKE 1 TABLET(100 MG) BY MOUTH TWICE DAILY WITH OR IMMEDIATELY FOLLOWING A MEAL 180 tablet 1   Multiple Vitamin (MULTIVITAMIN) tablet Take 1 tablet by mouth daily.     sacubitril -valsartan  (ENTRESTO ) 24-26 MG Take 1 tablet by mouth 2 (two) times daily. TAKE 1 TABLET BY MOUTH 2 (TWO) TIMES DAILY. 180 tablet 3   torsemide  (DEMADEX ) 20 MG tablet Take 1 tablet (20 mg total) by mouth as needed. 30 tablet 5   vitamin B-12 (CYANOCOBALAMIN ) 1000 MCG tablet Take 1,000 mcg by mouth daily.     No current facility-administered medications for this encounter.   Filed Weights   02/05/24 1121  Weight: 95 kg (209 lb 6.4 oz)    Physical Exam: Vitals:   02/05/24 1121  BP: 124/80   Pulse: 61  SpO2: 99%   GENERAL: NAD Lungs- clear  CARDIAC:  JVP not elevated         Normal rate with regular rhythm. No MRG. No LEE  ABDOMEN: Soft, non-tender, non-distended.  EXTREMITIES: Warm and well perfused.  NEUROLOGIC: No obvious FND  Medtronic device interrogated: OptiVol thoracic impedence/index good and c/w euvolemia activity about 2-3  hrs/day, no  AT/AF. No VT/VF  ECG not ordered today  Assessment/Plan: 1. HF with improved EF: Nonischemic cardiomyopathy.  Cardiomyopathy known since 58.  Possibly peri-partum. SPEP negative.  Has Medtronic ICD.  LHC (1/18) with no coronary disease. Echo in 5/22 showed EF up to 50%, most recent echo 1/25 EF stable 50-55%, G1DD. - NYHA class I. Euvolemic on exam and device interrogation  - Continue Toprol  XL 100 mg bid.    - Continue Entresto  24/26 bid.  - She is off Farxiga  due to frequent UTIs.  - repeat echo again in 6 months  - I personally reviewed recent labs. SCr and K WNL 2. Atrial flutter/fibrillation:  Noted in 12/17.  TEE in 1/18 showed LA appendage thrombus. She is now on Eliquis . Suspect atrial fibrillation with RVR drove her last CHF decompensation.  No AT/AF episodes on device interrogation.  - If atrial fibrillation recurs off amiodarone , would favor atrial fibrillation ablation.  - Continue Toprol  XL 100 bid.   - Continue Eliquis  5 mg bid. Denies gross bleeding. 3. CKD: Stage IIIb.  She has an atrophic left kidney.   - Off Farxiga  with UTIs - reviewed recent BMP. SCr stable  4. H/o VT: Has Medtronic ICD. - No episodes on device check 5. OSA: Off CPAP but no longer snores with weight loss.    F/u w/ Dr. Rolan in 6 months w/ repeat echo   Caffie Shed, PA-C  02/05/2024

## 2024-02-05 NOTE — Patient Instructions (Addendum)
 Good to see you today!   No medication changes were made   Your physician has requested that you have an echocardiogram. Echocardiography is a painless test that uses sound waves to create images of your heart. It provides your doctor with information about the size and shape of your heart and how well your heart's chambers and valves are working. This procedure takes approximately one hour. There are no restrictions for this procedure. Please do NOT wear cologne, perfume, aftershave, or lotions (deodorant is allowed). Please arrive 15 minutes prior to your appointment time.  Please note: We ask at that you not bring children with you during ultrasound (echo/ vascular) testing. Due to room size and safety concerns, children are not allowed in the ultrasound rooms during exams. Our front office staff cannot provide observation of children in our lobby area while testing is being conducted. An adult accompanying a patient to their appointment will only be allowed in the ultrasound room at the discretion of the ultrasound technician under special circumstances. We apologize for any inconvenience.  Your physician recommends that you schedule a follow-up appointment 6 months with echocardiogram(May ) Call office in march to schedule an appointment  If you have any questions or concerns before your next appointment please send us  a message through [place] or call our office at [phone].    TO LEAVE A MESSAGE FOR THE NURSE SELECT OPTION 2, PLEASE LEAVE A MESSAGE INCLUDING: YOUR NAME DATE OF BIRTH CALL BACK NUMBER REASON FOR CALL**this is important as we prioritize the call backs  YOU WILL RECEIVE A CALL BACK THE SAME DAY AS LONG AS YOU CALL BEFORE 4:00 PM At the [facility], you and your health needs are our priority. As part of our continuing mission to provide you with exceptional heart care, we have created designated Provider Care Teams. These Care Teams include your primary  Cardiologist (physician) and Advanced Practice Providers (APPs- Physician Assistants and Nurse Practitioners) who all work together to provide you with the care you need, when you need it.   You may see any of the following providers on your designated Care Team at your next follow up: Dr Toribio Fuel Dr Ezra Shuck Dr. Morene Brownie Greig Mosses, NP Caffie Shed, [place] [facility] [place], [place] Beckey Coe, NP Jordan Lee, NP Ellouise Class, NP Tinnie Redman, PharmD Jaun Bash, PharmD   Please be sure to bring in all your medications bottles to every appointment.    Thank you for choosing [facility]

## 2024-02-05 NOTE — Telephone Encounter (Signed)
 Advanced Heart Failure Patient Advocate Encounter  This patient may be eligible for a grant that would help to cover the cost of Eliquis , Entresto , Metoprolol . [employer] is currently experiencing technical issues and I am not able to submit application at this time. I have spoken to patient by phone and I will attempt to resubmit on Monday and contact patient with update.  Rachel DEL, CPhT Rx Patient Advocate Phone: [phone]

## 2024-02-05 NOTE — Progress Notes (Signed)
 Medication Samples have been provided to the patient.  Drug name: Eliquis        Strength: 5 mg        Qty: 4   LOT: [id number]  Exp.Date: 08/2025  Dosing instructions: Take 1 tablet Twice daily   The patient has been instructed regarding the correct time, dose, and frequency of taking this medication, including desired effects and most common side effects.   [name] 12:03 PM 02/05/2024

## 2024-02-08 NOTE — Telephone Encounter (Signed)
 Patient is already enrolled for [employer] grant to cover these medications. Spoke to pharmacy to reprocess Eliquis , confirmed $0 copay. Informed patient by phone.

## 2024-02-18 ENCOUNTER — Encounter: Payer: Self-pay | Admitting: Cardiology

## 2024-03-04 ENCOUNTER — Ambulatory Visit: Payer: Medicare Other

## 2024-03-04 DIAGNOSIS — I5022 Chronic systolic (congestive) heart failure: Secondary | ICD-10-CM

## 2024-03-07 LAB — CUP PACEART REMOTE DEVICE CHECK
Battery Remaining Longevity: 64 mo
Battery Voltage: 2.99 V
Brady Statistic RV Percent Paced: 0.66 %
Date Time Interrogation Session: 20251205022824
HighPow Impedance: 55 Ohm
Implantable Lead Connection Status: 753985
Implantable Lead Implant Date: 20191203
Implantable Lead Location: 753860
Implantable Pulse Generator Implant Date: 20191203
Lead Channel Impedance Value: 285 Ohm
Lead Channel Impedance Value: 304 Ohm
Lead Channel Pacing Threshold Amplitude: 0.875 V
Lead Channel Pacing Threshold Pulse Width: 0.4 ms
Lead Channel Sensing Intrinsic Amplitude: 4.75 mV
Lead Channel Sensing Intrinsic Amplitude: 4.75 mV
Lead Channel Setting Pacing Amplitude: 2.5 V
Lead Channel Setting Pacing Pulse Width: 0.4 ms
Lead Channel Setting Sensing Sensitivity: 0.3 mV
Zone Setting Status: 755011
Zone Setting Status: 755011

## 2024-03-08 NOTE — Progress Notes (Signed)
 Remote ICD Transmission

## 2024-03-14 ENCOUNTER — Ambulatory Visit

## 2024-03-16 NOTE — Progress Notes (Signed)
 No ICM remote transmission received for 03/14/2024 and next ICM transmission scheduled for 04/04/2024.

## 2024-04-04 ENCOUNTER — Ambulatory Visit

## 2024-04-06 NOTE — Progress Notes (Signed)
 No ICM remote transmission received for 04/04/2024 and next ICM transmission scheduled for 05/02/2024.

## 2024-04-16 ENCOUNTER — Other Ambulatory Visit (HOSPITAL_COMMUNITY): Payer: Self-pay | Admitting: Cardiology

## 2024-04-20 ENCOUNTER — Other Ambulatory Visit (HOSPITAL_COMMUNITY): Payer: Self-pay | Admitting: Cardiology

## 2024-04-20 DIAGNOSIS — I5022 Chronic systolic (congestive) heart failure: Secondary | ICD-10-CM

## 2024-04-28 NOTE — Progress Notes (Signed)
 31 day ICM Remote transmission canceled due to [facility] clinic is on hold until further notice.  91 day remote monitoring will continue per protocol.

## 2024-05-02 ENCOUNTER — Ambulatory Visit

## 2024-06-03 ENCOUNTER — Ambulatory Visit: Payer: Medicare Other

## 2024-09-02 ENCOUNTER — Ambulatory Visit: Payer: Medicare Other
